# Patient Record
Sex: Male | Born: 1965 | Race: Black or African American | Hispanic: No | Marital: Single | Smoking: Current every day smoker
Health system: Southern US, Community
[De-identification: ages and names within clinical notes are randomized; demographics above are authoritative.]

## PROBLEM LIST (undated history)

## (undated) DIAGNOSIS — I10 Essential (primary) hypertension: Secondary | ICD-10-CM

## (undated) DIAGNOSIS — R7303 Prediabetes: Secondary | ICD-10-CM

## (undated) DIAGNOSIS — I639 Cerebral infarction, unspecified: Secondary | ICD-10-CM

## (undated) HISTORY — PX: TONSILLECTOMY: SUR1361

---

## 2009-01-28 ENCOUNTER — Emergency Department (HOSPITAL_BASED_OUTPATIENT_CLINIC_OR_DEPARTMENT_OTHER): Admission: EM | Admit: 2009-01-28 | Discharge: 2009-01-28 | Payer: Self-pay | Admitting: Emergency Medicine

## 2015-02-15 ENCOUNTER — Other Ambulatory Visit (INDEPENDENT_AMBULATORY_CARE_PROVIDER_SITE_OTHER): Payer: Self-pay | Admitting: Surgery

## 2015-03-08 ENCOUNTER — Encounter (HOSPITAL_COMMUNITY)
Admission: RE | Admit: 2015-03-08 | Discharge: 2015-03-08 | Disposition: A | Discharge status: Home / Self Care | Payer: Worker's Compensation | Source: Ambulatory Visit | Attending: Surgery | Admitting: Surgery

## 2015-03-08 ENCOUNTER — Encounter (HOSPITAL_COMMUNITY): Payer: Self-pay

## 2015-03-08 DIAGNOSIS — K429 Umbilical hernia without obstruction or gangrene: Secondary | ICD-10-CM | POA: Insufficient documentation

## 2015-03-08 DIAGNOSIS — Z01812 Encounter for preprocedural laboratory examination: Secondary | ICD-10-CM | POA: Diagnosis present

## 2015-03-08 LAB — CBC
HCT: 45.6 % (ref 39.0–52.0)
HEMOGLOBIN: 15.5 g/dL (ref 13.0–17.0)
MCH: 29.5 pg (ref 26.0–34.0)
MCHC: 34 g/dL (ref 30.0–36.0)
MCV: 86.9 fL (ref 78.0–100.0)
Platelets: 136 10*3/uL — ABNORMAL LOW (ref 150–400)
RBC: 5.25 MIL/uL (ref 4.22–5.81)
RDW: 13.2 % (ref 11.5–15.5)
WBC: 11 10*3/uL — ABNORMAL HIGH (ref 4.0–10.5)

## 2015-03-08 NOTE — Progress Notes (Signed)
Patient denies any cardiac issues.  DA

## 2015-03-08 NOTE — Pre-Procedure Instructions (Signed)
Aaron Gibson  03/08/2015   Your procedure is scheduled on:  Tuesday, March 22nd   Report [facility] Admitting at  7:00 AM.  Call this number if you have problems the morning of surgery: [phone]   Remember:   Do not eat food or drink liquids after midnight Monday.   Take these medicines the morning of surgery with A SIP OF WATER:    Do not wear jewelry - no rings or watches.  Do not wear lotions or colognes.  You may NOT wear deodorant the day of surgery.   Men may shave face and neck.   Do not bring valuables to the hospital.  [facility] is not responsible for any belongings or valuables.               Contacts, dentures or bridgework may not be worn into surgery.  Leave suitcase in the car. After surgery it may be brought to your room.  For patients admitted to the hospital, discharge time is determined by your treatment team.               Patients discharged the day of surgery will not be allowed to drive home, and will need someone to stay with you for the first 24 hrs after surgery.   Name and phone number of your driver:    Special Instructions: "Preparing for Surgery" instruction sheet.   Please read over the following fact sheets that you were given: Pain Booklet, Coughing and Deep Breathing and Surgical Site Infection Prevention

## 2015-03-14 MED ORDER — CEFAZOLIN SODIUM-DEXTROSE 2-3 GM-% IV SOLR
2.0000 g | INTRAVENOUS | Status: AC
  Administered 2015-03-15: 2 g via INTRAVENOUS
  Filled 2015-03-14: qty 50

## 2015-03-14 NOTE — H&P (Signed)
Aaron Gibson 02/15/2015 11:23 AM [facility] Patient #: [id number] DOB: 12-Feb-1966 Single / Language: [name] / Race: Black or African American Male  History of Present Illness Aaron Gibson A. Magnus Ivan MD; 02/15/2015 11:38 AM) Patient words: umb hernia.  The patient is a 49 year old male who presents with an umbilical hernia. This is a very pleasant gentleman referred by Dr. Ardith Dark for evaluation of an umbilical hernia. 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 Later that day he noticed a bulge was difficult to reduce. He started having some nausea and abdominal pain which worsened. It has since improved but he still has a bulge which worsens with any activity including eating. The pain is moderate in intensity. He also has some constipation. Again, it is a sharp pain   Other Problems Aaron Gibson, CMA; 02/15/2015 11:23 AM) No pertinent past medical history  Past Surgical History Aaron Gibson, CMA; 02/15/2015 11:23 AM) No pertinent past surgical history  Diagnostic Studies History Aaron Gibson, CMA; 02/15/2015 11:23 AM) Colonoscopy never  Allergies Aaron Gibson, CMA; 02/15/2015 11:25 AM) No Known Drug Allergies02/23/2016  Medication History Aaron Gibson, CMA; 02/15/2015 11:25 AM) No Current Medications Medications Reconciled  Social History Aaron Gibson, CMA; 02/15/2015 11:23 AM) Alcohol use Occasional alcohol use. Caffeine use Carbonated beverages, Coffee, Tea. No drug use Tobacco use Current every day smoker.  Family History Aaron Gibson, CMA; 02/15/2015 11:23 AM) First Degree Relatives No pertinent family history  Review of Systems Aaron Gibson CMA; 02/15/2015 11:23 AM) General Not Present- Appetite Loss, Chills, Fatigue, Fever, Night Sweats, Weight Gain and Weight Loss. Skin Not Present- Change in Wart/Mole, Dryness, Hives, Jaundice, New  Lesions, Non-Healing Wounds, Rash and Ulcer. HEENT Not Present- Earache, Hearing Loss, Hoarseness, Nose Bleed, Oral Ulcers, Ringing in the Ears, Seasonal Allergies, Sinus Pain, Sore Throat, Visual Disturbances, Wears glasses/contact lenses and Yellow Eyes. Respiratory Not Present- Bloody sputum, Chronic Cough, Difficulty Breathing, Snoring and Wheezing. Breast Not Present- Breast Mass, Breast Pain, Nipple Discharge and Skin Changes. Cardiovascular Not Present- Chest Pain, Difficulty Breathing Lying Down, Leg Cramps, Palpitations, Rapid Heart Rate, Shortness of Breath and Swelling of Extremities. Gastrointestinal Present- Abdominal Pain, Bloating, Change in Bowel Habits, Constipation and Excessive gas. Not Present- Bloody Stool, Chronic diarrhea, Difficulty Swallowing, Gets full quickly at meals, Hemorrhoids, Indigestion, Nausea, Rectal Pain and Vomiting. Male Genitourinary Not Present- Blood in Urine, Change in Urinary Stream, Frequency, Impotence, Nocturia, Painful Urination, Urgency and Urine Leakage. Musculoskeletal Not Present- Back Pain, Joint Pain, Joint Stiffness, Muscle Pain, Muscle Weakness and Swelling of Extremities. Neurological Not Present- Decreased Memory, Fainting, Headaches, Numbness, Seizures, Tingling, Tremor, Trouble walking and Weakness. Psychiatric Not Present- Anxiety, Bipolar, Change in Sleep Pattern, Depression, Fearful and Frequent crying. Endocrine Not Present- Cold Intolerance, Excessive Hunger, Hair Changes, Heat Intolerance, Hot flashes and New Diabetes. Hematology Not Present- Easy Bruising, Excessive bleeding, Gland problems, HIV and Persistent Infections.   Vitals (Sonya Gibson CMA; 02/15/2015 11:25 AM) 02/15/2015 11:24 AM Weight: 250 lb Height: 67in Body Surface Area: 2.32 m Body Mass Index: 39.16 kg/m Temp.: 53F(Temporal)  Pulse: 84 (Regular)  BP: 160/104 (Sitting, Left Arm, Standard)    Physical Exam (Kylon Philbrook A. Magnus Ivan MD; 02/15/2015 11:38  AM) General Mental Status-Alert. General Appearance-Consistent with stated age. Hydration-Well hydrated. Voice-Normal.  Head and Neck Head-normocephalic, atraumatic with no lesions or palpable masses. Trachea-midline.  Eye Eyeball - Bilateral-Extraocular movements intact. Sclera/Conjunctiva - Bilateral-No scleral  icterus.  Chest and Lung Exam Chest and lung exam reveals -quiet, even and easy respiratory effort with no use of accessory muscles and on auscultation, normal breath sounds, no adventitious sounds and normal vocal resonance. Inspection Chest Wall - Normal. Back - normal.  Cardiovascular Cardiovascular examination reveals -normal heart sounds, regular rate and rhythm with no murmurs and normal pedal pulses bilaterally.  Abdomen Inspection Skin - Scar - no surgical scars. Hernias - Umbilical hernia - Incarcerated. Note: There is a partially incarcerated umbilical hernia. It is mildly tender. I suspect it contains omentum although I cannot totally rule out bowel. Palpation/Percussion Palpation and Percussion of the abdomen reveal - Soft, Non Tender, No Rebound tenderness, No Rigidity (guarding) and No hepatosplenomegaly. Auscultation Auscultation of the abdomen reveals - Bowel sounds normal.  Neurologic Neurologic evaluation reveals -alert and oriented x 3 with no impairment of recent or remote memory. Mental Status-Normal.  Musculoskeletal Normal Exam - Left-Upper Extremity Strength Normal and Lower Extremity Strength Normal. Normal Exam - Right-Upper Extremity Strength Normal, Lower Extremity Weakness.    Assessment & Plan (Amarea Macdowell A. Magnus IvanBlackman MD; 02/15/2015 11:39 AM) Aaron ClanINCARCERATED UMBILICAL HERNIA (552.1  K42.0) Impression: I discussed the diagnosis with him in detail. I recommend urgent repair as he is symptomatic and there is something incarcerated in the hernia. I discussed hernia repair with him in detail. I discussed use of mesh. I  discussed the risks which includes but is not limited to bleeding, infection, injury to surrounding structures, recurrence, etc. I discussed postoperative recovery. He will have to refrain from any lifting postoperatively for 4-6 weeks. Surgery will be scheduled urgently Current Plans  Started Percocet 5-325MG , 1 (one) Tablet every hour, as needed, #40, 02/15/2015, No Refill.

## 2015-03-15 ENCOUNTER — Ambulatory Visit (HOSPITAL_COMMUNITY): Payer: Worker's Compensation | Admitting: Anesthesiology

## 2015-03-15 ENCOUNTER — Encounter (HOSPITAL_COMMUNITY)
Admission: RE | Disposition: A | Discharge status: Home / Self Care | Payer: Self-pay | Source: Ambulatory Visit | Attending: Surgery

## 2015-03-15 ENCOUNTER — Encounter (HOSPITAL_COMMUNITY): Payer: Self-pay | Admitting: *Deleted

## 2015-03-15 ENCOUNTER — Ambulatory Visit (HOSPITAL_COMMUNITY)
Admission: RE | Admit: 2015-03-15 | Discharge: 2015-03-15 | Disposition: A | Discharge status: Home / Self Care | Payer: Worker's Compensation | Source: Ambulatory Visit | Attending: Surgery | Admitting: Surgery

## 2015-03-15 DIAGNOSIS — K420 Umbilical hernia with obstruction, without gangrene: Secondary | ICD-10-CM | POA: Diagnosis not present

## 2015-03-15 DIAGNOSIS — Z87891 Personal history of nicotine dependence: Secondary | ICD-10-CM | POA: Insufficient documentation

## 2015-03-15 DIAGNOSIS — K429 Umbilical hernia without obstruction or gangrene: Secondary | ICD-10-CM | POA: Diagnosis present

## 2015-03-15 DIAGNOSIS — Z6839 Body mass index (BMI) 39.0-39.9, adult: Secondary | ICD-10-CM | POA: Insufficient documentation

## 2015-03-15 HISTORY — PX: UMBILICAL HERNIA REPAIR: SHX196

## 2015-03-15 LAB — GLUCOSE, CAPILLARY: Glucose-Capillary: 111 mg/dL — ABNORMAL HIGH (ref 70–99)

## 2015-03-15 SURGERY — REPAIR, HERNIA, UMBILICAL, ADULT
Anesthesia: General

## 2015-03-15 MED ORDER — BUPIVACAINE HCL (PF) 0.25 % IJ SOLN
INTRAMUSCULAR | Status: DC | PRN
Start: 2015-03-15 — End: 2015-03-15
  Administered 2015-03-15: 20 mL

## 2015-03-15 MED ORDER — HYDROMORPHONE HCL 1 MG/ML IJ SOLN
INTRAMUSCULAR | Status: AC
  Filled 2015-03-15: qty 1

## 2015-03-15 MED ORDER — BUPIVACAINE HCL (PF) 0.25 % IJ SOLN
INTRAMUSCULAR | Status: AC
  Filled 2015-03-15: qty 30

## 2015-03-15 MED ORDER — FENTANYL CITRATE 0.05 MG/ML IJ SOLN
INTRAMUSCULAR | Status: AC
  Filled 2015-03-15: qty 5

## 2015-03-15 MED ORDER — PROPOFOL 10 MG/ML IV BOLUS
INTRAVENOUS | Status: DC | PRN
  Administered 2015-03-15: 300 mg via INTRAVENOUS
  Administered 2015-03-15: 50 mg via INTRAVENOUS

## 2015-03-15 MED ORDER — PROPOFOL 10 MG/ML IV BOLUS
INTRAVENOUS | Status: AC
  Filled 2015-03-15: qty 20

## 2015-03-15 MED ORDER — ACETAMINOPHEN 325 MG PO TABS
650.0000 mg | ORAL_TABLET | ORAL | Status: DC | PRN
  Filled 2015-03-15: qty 2

## 2015-03-15 MED ORDER — 0.9 % SODIUM CHLORIDE (POUR BTL) OPTIME
TOPICAL | Status: DC | PRN
  Administered 2015-03-15: 1000 mL

## 2015-03-15 MED ORDER — FENTANYL CITRATE 0.05 MG/ML IJ SOLN
INTRAMUSCULAR | Status: DC | PRN
  Administered 2015-03-15 (×4): 50 ug via INTRAVENOUS

## 2015-03-15 MED ORDER — KETOROLAC TROMETHAMINE 30 MG/ML IJ SOLN
30.0000 mg | Freq: Once | INTRAMUSCULAR | Status: AC
  Administered 2015-03-15: 30 mg via INTRAVENOUS

## 2015-03-15 MED ORDER — BUPIVACAINE-EPINEPHRINE (PF) 0.25% -1:200000 IJ SOLN
INTRAMUSCULAR | Status: AC
  Filled 2015-03-15: qty 30

## 2015-03-15 MED ORDER — ACETAMINOPHEN 650 MG RE SUPP
650.0000 mg | RECTAL | Status: DC | PRN
  Filled 2015-03-15: qty 1

## 2015-03-15 MED ORDER — SODIUM CHLORIDE 0.9 % IJ SOLN: 3.0000 mL | INTRAMUSCULAR | Status: DC | PRN

## 2015-03-15 MED ORDER — PROMETHAZINE HCL 25 MG/ML IJ SOLN: 6.2500 mg | INTRAMUSCULAR | Status: DC | PRN

## 2015-03-15 MED ORDER — MIDAZOLAM HCL 2 MG/2ML IJ SOLN
INTRAMUSCULAR | Status: AC
  Filled 2015-03-15: qty 2

## 2015-03-15 MED ORDER — OXYCODONE HCL 5 MG PO TABS
ORAL_TABLET | ORAL | Status: AC
  Filled 2015-03-15: qty 2

## 2015-03-15 MED ORDER — FENTANYL CITRATE 0.05 MG/ML IJ SOLN
INTRAMUSCULAR | Status: AC
  Filled 2015-03-15: qty 2

## 2015-03-15 MED ORDER — BUPIVACAINE-EPINEPHRINE (PF) 0.5% -1:200000 IJ SOLN
INTRAMUSCULAR | Status: AC
  Filled 2015-03-15: qty 30

## 2015-03-15 MED ORDER — LIDOCAINE HCL (CARDIAC) 20 MG/ML IV SOLN
INTRAVENOUS | Status: AC
  Filled 2015-03-15: qty 5

## 2015-03-15 MED ORDER — OXYCODONE HCL 5 MG PO TABS
5.0000 mg | ORAL_TABLET | ORAL | Status: DC | PRN
  Administered 2015-03-15: 10 mg via ORAL

## 2015-03-15 MED ORDER — SODIUM CHLORIDE 0.9 % IJ SOLN: 3.0000 mL | Freq: Two times a day (BID) | INTRAMUSCULAR | Status: DC

## 2015-03-15 MED ORDER — ONDANSETRON HCL 4 MG/2ML IJ SOLN
INTRAMUSCULAR | Status: AC
  Filled 2015-03-15: qty 2

## 2015-03-15 MED ORDER — KETOROLAC TROMETHAMINE 30 MG/ML IJ SOLN
INTRAMUSCULAR | Status: AC
  Filled 2015-03-15: qty 1

## 2015-03-15 MED ORDER — SODIUM CHLORIDE 0.9 % IV SOLN: 250.0000 mL | INTRAVENOUS | Status: DC | PRN

## 2015-03-15 MED ORDER — LACTATED RINGERS IV SOLN
INTRAVENOUS | Status: DC
  Administered 2015-03-15: 08:00:00 via INTRAVENOUS

## 2015-03-15 MED ORDER — MORPHINE SULFATE 2 MG/ML IJ SOLN: 1.0000 mg | INTRAMUSCULAR | Status: DC | PRN

## 2015-03-15 MED ORDER — OXYCODONE-ACETAMINOPHEN 5-325 MG PO TABS: 1.0000 | ORAL_TABLET | ORAL | Status: DC | PRN

## 2015-03-15 MED ORDER — MIDAZOLAM HCL 5 MG/5ML IJ SOLN
INTRAMUSCULAR | Status: DC | PRN
  Administered 2015-03-15: 2 mg via INTRAVENOUS

## 2015-03-15 MED ORDER — LIDOCAINE HCL (CARDIAC) 20 MG/ML IV SOLN
INTRAVENOUS | Status: DC | PRN
  Administered 2015-03-15: 100 mg via INTRAVENOUS

## 2015-03-15 MED ORDER — FENTANYL CITRATE 0.05 MG/ML IJ SOLN
25.0000 ug | INTRAMUSCULAR | Status: DC | PRN
  Administered 2015-03-15 (×3): 50 ug via INTRAVENOUS

## 2015-03-15 MED ORDER — KETOROLAC TROMETHAMINE 30 MG/ML IJ SOLN
30.0000 mg | Freq: Once | INTRAMUSCULAR | Status: AC | PRN
  Administered 2015-03-15: 30 mg via INTRAVENOUS

## 2015-03-15 MED ORDER — ONDANSETRON HCL 4 MG/2ML IJ SOLN
INTRAMUSCULAR | Status: DC | PRN
  Administered 2015-03-15: 4 mg via INTRAVENOUS

## 2015-03-15 MED ORDER — HYDROMORPHONE HCL 1 MG/ML IJ SOLN
0.5000 mg | INTRAMUSCULAR | Status: DC | PRN
  Administered 2015-03-15 (×2): 0.5 mg via INTRAVENOUS

## 2015-03-15 SURGICAL SUPPLY — 45 items
BLADE SURG 10 STRL SS (BLADE) ×2 IMPLANT
BLADE SURG 15 STRL LF DISP TIS (BLADE) ×1 IMPLANT
BLADE SURG 15 STRL SS (BLADE) ×1
BLADE SURG ROTATE 9660 (MISCELLANEOUS) IMPLANT
CANISTER SUCTION 2500CC (MISCELLANEOUS) IMPLANT
CHLORAPREP W/TINT 26ML (MISCELLANEOUS) ×2 IMPLANT
COVER SURGICAL LIGHT HANDLE (MISCELLANEOUS) ×2 IMPLANT
DECANTER SPIKE VIAL GLASS SM (MISCELLANEOUS) ×2 IMPLANT
DRAPE PED LAPAROTOMY (DRAPES) ×2 IMPLANT
DRAPE UTILITY XL STRL (DRAPES) ×4 IMPLANT
DRSG TEGADERM 4X4.75 (GAUZE/BANDAGES/DRESSINGS) IMPLANT
ELECT CAUTERY BLADE 6.4 (BLADE) ×2 IMPLANT
ELECT REM PT RETURN 9FT ADLT (ELECTROSURGICAL) ×2
ELECTRODE REM PT RTRN 9FT ADLT (ELECTROSURGICAL) ×1 IMPLANT
GAUZE SPONGE 2X2 8PLY STRL LF (GAUZE/BANDAGES/DRESSINGS) IMPLANT
GLOVE BIOGEL PI IND STRL 6 (GLOVE) ×1 IMPLANT
GLOVE BIOGEL PI IND STRL 7.0 (GLOVE) ×1 IMPLANT
GLOVE BIOGEL PI INDICATOR 6 (GLOVE) ×1
GLOVE BIOGEL PI INDICATOR 7.0 (GLOVE) ×1
GLOVE SURG SIGNA 7.5 PF LTX (GLOVE) ×2 IMPLANT
GLOVE SURG SS PI 7.0 STRL IVOR (GLOVE) ×2 IMPLANT
GOWN STRL REUS W/ TWL LRG LVL3 (GOWN DISPOSABLE) ×1 IMPLANT
GOWN STRL REUS W/ TWL XL LVL3 (GOWN DISPOSABLE) ×1 IMPLANT
GOWN STRL REUS W/TWL LRG LVL3 (GOWN DISPOSABLE) ×1
GOWN STRL REUS W/TWL XL LVL3 (GOWN DISPOSABLE) ×1
KIT BASIN OR (CUSTOM PROCEDURE TRAY) ×2 IMPLANT
KIT ROOM TURNOVER OR (KITS) ×2 IMPLANT
LIQUID BAND (GAUZE/BANDAGES/DRESSINGS) ×2 IMPLANT
MESH VENTRALEX ST 1-7/10 CRC S (Mesh General) ×2 IMPLANT
NEEDLE HYPO 25GX1X1/2 BEV (NEEDLE) ×2 IMPLANT
NS IRRIG 1000ML POUR BTL (IV SOLUTION) ×2 IMPLANT
PACK SURGICAL SETUP 50X90 (CUSTOM PROCEDURE TRAY) ×2 IMPLANT
PAD ARMBOARD 7.5X6 YLW CONV (MISCELLANEOUS) ×2 IMPLANT
PENCIL BUTTON HOLSTER BLD 10FT (ELECTRODE) ×2 IMPLANT
SPONGE GAUZE 2X2 STER 10/PKG (GAUZE/BANDAGES/DRESSINGS)
SPONGE LAP 18X18 X RAY DECT (DISPOSABLE) ×2 IMPLANT
SUT MNCRL AB 4-0 PS2 18 (SUTURE) ×2 IMPLANT
SUT NOVA NAB DX-16 0-1 5-0 T12 (SUTURE) ×4 IMPLANT
SUT VIC AB 3-0 SH 27 (SUTURE) ×1
SUT VIC AB 3-0 SH 27X BRD (SUTURE) ×1 IMPLANT
SYR CONTROL 10ML LL (SYRINGE) ×2 IMPLANT
TOWEL OR 17X24 6PK STRL BLUE (TOWEL DISPOSABLE) ×2 IMPLANT
TOWEL OR 17X26 10 PK STRL BLUE (TOWEL DISPOSABLE) ×2 IMPLANT
TUBE CONNECTING 12X1/4 (SUCTIONS) IMPLANT
YANKAUER SUCT BULB TIP NO VENT (SUCTIONS) IMPLANT

## 2015-03-15 NOTE — Interval H&P Note (Signed)
History and Physical Interval Note: no change in H and P  03/15/2015 8:10 AM  Aaron Gibson  has presented today for surgery, with the diagnosis of Umbilical Hernia  The various methods of treatment have been discussed with the patient and family. After consideration of risks, benefits and other options for treatment, the patient has consented to  Procedure(s): UMBILICAL HERNIA REPAIR WITH MESH (N/A) as a surgical intervention .  The patient's history has been reviewed, patient examined, no change in status, stable for surgery.  I have reviewed the patient's chart and labs.  Questions were answered to the patient's satisfaction.     Oliva Montecalvo A

## 2015-03-15 NOTE — Anesthesia Procedure Notes (Signed)
Procedure Name: LMA Insertion Date/Time: 03/15/2015 8:37 AM Performed by: Sharlene DoryWALKER, Beecher Furio E Pre-anesthesia Checklist: Patient identified, Emergency Drugs available, Suction available, Patient being monitored and Timeout performed Patient Re-evaluated:Patient Re-evaluated prior to inductionOxygen Delivery Method: Circle system utilized Preoxygenation: Pre-oxygenation with 100% oxygen Intubation Type: IV induction Ventilation: Mask ventilation without difficulty LMA: LMA inserted LMA Size: 4.0 Number of attempts: 1 Placement Confirmation: positive ETCO2 and breath sounds checked- equal and bilateral Tube secured with: Tape Dental Injury: Teeth and Oropharynx as per pre-operative assessment

## 2015-03-15 NOTE — Discharge Instructions (Signed)
[facility] _______[facility] ° °UMBILICAL OR INGUINAL HERNIA REPAIR: POST OP INSTRUCTIONS ° °Always review your discharge instruction sheet given to you by the facility where your surgery was performed. °IF YOU HAVE DISABILITY OR FAMILY LEAVE FORMS, YOU MUST BRING THEM TO THE OFFICE FOR PROCESSING.   °DO NOT GIVE THEM TO YOUR DOCTOR. ° °1. A  prescription for pain medication may be given to you upon discharge.  Take your pain medication as prescribed, if needed.  If narcotic pain medicine is not needed, then you may take acetaminophen (Tylenol) or ibuprofen (Advil) as needed. °2. Take your usually prescribed medications unless otherwise directed. °3. If you need a refill on your pain medication, please contact your pharmacy.  They will contact our office to request authorization. Prescriptions will not be filled after 5 pm or on week-ends. °4. You should follow a light diet the first 24 hours after arrival home, such as soup and crackers, etc.  Be sure to include lots of fluids daily.  Resume your normal diet the day after surgery. °5. Most patients will experience some swelling and bruising around the umbilicus or in the groin and scrotum.  Ice packs and reclining will help.  Swelling and bruising can take several days to resolve.  °6. It is common to experience some constipation if taking pain medication after surgery.  Increasing fluid intake and taking a stool softener (such as Colace) will usually help or prevent this problem from occurring.  A mild laxative (Milk of Magnesia or Miralax) should be taken according to package directions if there are no bowel movements after 48 hours. °7. Unless discharge instructions indicate otherwise, you may remove your bandages 24-48 hours after surgery, and you may shower at that time.  You may have steri-strips (small skin tapes) in place directly over the incision.  These strips should be left on the skin for 7-10 days.  If your surgeon used skin glue on the  incision, you may shower in 24 hours.  The glue will flake off over the next 2-3 weeks.  Any sutures or staples will be removed at the office during your follow-up visit. °8. ACTIVITIES:  You may resume regular (light) daily activities beginning the next day--such as daily self-care, walking, climbing stairs--gradually increasing activities as tolerated.  You may have sexual intercourse when it is comfortable.  Refrain from any heavy lifting or straining until approved by your doctor. °a. You may drive when you are no longer taking prescription pain medication, you can comfortably wear a seatbelt, and you can safely maneuver your car and apply brakes. °b. RETURN TO WORK:  __________________________________________________________ °9. You should see your doctor in the office for a follow-up appointment approximately 2-3 weeks after your surgery.  Make sure that you call for this appointment within a day or two after you arrive home to insure a convenient appointment time. °10. OTHER INSTRUCTIONS: NO LIFTING MORE THAN 15 POUNDS FOR 4 WEEKS °11. ICE PACK AND IBUPROFEN ALSO FOR PAIN __________________________________________________________________________________________________________________________________________________________________________________________  °WHEN TO CALL YOUR DOCTOR: °1. Fever over 101.0 °2. Inability to urinate °3. Nausea and/or vomiting °4. Extreme swelling or bruising °5. Continued bleeding from incision. °6. Increased pain, redness, or drainage from the incision ° °The clinic staff is available to answer your questions during regular business hours.  Please don’t hesitate to call and ask to speak to one of the nurses for clinical concerns.  If you have a medical emergency, go to the nearest emergency room or call 911.    A surgeon from [facility] is always on call at the hospital ° ° °[address], [address], [place], [place]  [address] ? ° [address] [address], [place], [place]    [address] °[phone] ? [phone] ? FAX [phone] °Web site: [email or web address] °

## 2015-03-15 NOTE — Transfer of Care (Signed)
Immediate Anesthesia Transfer of Care Note  Patient: Aaron CivilRandy Curt  Procedure(s) Performed: Procedure(s): UMBILICAL HERNIA REPAIR WITH MESH (N/A)  Patient Location: PACU  Anesthesia Type:General  Level of Consciousness: awake, alert  and oriented  Airway & Oxygen Therapy: Patient Spontanous Breathing and Patient connected to nasal cannula oxygen  Post-op Assessment: Report given to RN, Post -op Vital signs reviewed and stable and Patient moving all extremities X 4  Post vital signs: Reviewed and stable  Last Vitals:  Filed Vitals:   03/15/15 0717  BP: 159/88  Pulse: 71  Temp: 37.1 C  Resp: 20    Complications: No apparent anesthesia complications

## 2015-03-15 NOTE — Anesthesia Preprocedure Evaluation (Addendum)
Anesthesia Evaluation  Patient identified by MRN, date of birth, ID band Patient awake    Reviewed: Allergy & Precautions, NPO status , Patient's Chart, lab work & pertinent test results  Airway Mallampati: II  TM Distance: <3 FB Neck ROM: Full    Dental no notable dental hx. (+) Teeth Intact   Pulmonary neg pulmonary ROS, former smoker,  breath sounds clear to auscultation  Pulmonary exam normal       Cardiovascular negative cardio ROS  Rhythm:Regular Rate:Normal     Neuro/Psych negative neurological ROS  negative psych ROS   GI/Hepatic negative GI ROS, Neg liver ROS,   Endo/Other  Morbid obesity  Renal/GU negative Renal ROS  negative genitourinary   Musculoskeletal negative musculoskeletal ROS (+)   Abdominal   Peds negative pediatric ROS (+)  Hematology negative hematology ROS (+)   Anesthesia Other Findings   Reproductive/Obstetrics negative OB ROS                            Anesthesia Physical Anesthesia Plan  ASA: II  Anesthesia Plan: General   Post-op Pain Management:    Induction: Intravenous  Airway Management Planned: Oral ETT  Additional Equipment:   Intra-op Plan:   Post-operative Plan: Extubation in OR  Informed Consent: I have reviewed the patients History and Physical, chart, labs and discussed the procedure including the risks, benefits and alternatives for the proposed anesthesia with the patient or authorized representative who has indicated his/her understanding and acceptance.   Dental advisory given  Plan Discussed with: CRNA and Surgeon  Anesthesia Plan Comments:         Anesthesia Quick Evaluation

## 2015-03-15 NOTE — Anesthesia Postprocedure Evaluation (Signed)
  Anesthesia Post-op Note  Patient: Aaron [name]  Procedure(s) Performed: Procedure(s) (LRB): UMBILICAL HERNIA REPAIR WITH MESH (N/A)  Patient Location: PACU  Anesthesia Type: General  Level of Consciousness: awake and alert   Airway and Oxygen Therapy: Patient Spontanous Breathing  Post-op Pain: mild  Post-op Assessment: Post-op Vital signs reviewed, Patient's Cardiovascular Status Stable, Respiratory Function Stable, Patent Airway and No signs of Nausea or vomiting  Last Vitals:  Filed Vitals:   03/15/15 1100  BP:   Pulse: 67  Temp:   Resp: 19    Post-op Vital Signs: stable   Complications: No apparent anesthesia complications

## 2015-03-15 NOTE — Op Note (Signed)
UMBILICAL HERNIA REPAIR WITH MESH  Procedure Note  Aaron Gibson Aaron Gibson 03/15/2015   Pre-op Diagnosis: Umbilical Hernia     Post-op Diagnosis: same  Procedure(s): UMBILICAL HERNIA REPAIR WITH MESH  Surgeon(s): Abigail Miyamotoouglas Antionio Negron, MD  Anesthesia: General  Staff:  Circulator: Lina SayreEmily E Ellis, RN; Trish MageMary W Tice, RN Scrub Person: Llana AlimentAnn M Wilson, CST  Estimated Blood Loss: Minimal                         Aaron Gibson   Date: 03/15/2015  Time: 9:18 AM

## 2015-03-16 ENCOUNTER — Encounter (HOSPITAL_COMMUNITY): Payer: Self-pay | Admitting: Surgery

## 2015-03-16 NOTE — Op Note (Signed)
[name], [name]               ACCOUNT NO.:  [id number]  MEDICAL RECORD NO.:  [id number]  LOCATION:  [facility]                         FACILITY:  [facility]  PHYSICIAN:  Abigail Miyamotoouglas Cameshia Cressman, M.D. DATE OF BIRTH:  May 02, 1966  DATE OF PROCEDURE:  03/15/2015 DATE OF DISCHARGE:  03/15/2015                              OPERATIVE REPORT   POSTOPERATIVE DIAGNOSIS:  Umbilical hernia.  POSTOPERATIVE DIAGNOSIS:  Umbilical hernia.  PROCEDURE:  Umbilical hernia repair with mesh.  SURGEON:  Abigail Miyamotoouglas Cleave Ternes, M.D.  ANESTHESIA:  General and 0.5% Marcaine.  ESTIMATED BLOOD LOSS:  Minimal.  FINDINGS:  The patient was found to have a small fascial defect just above the umbilicus containing incarcerated omentum.  It was repaired with a 4.3-cm round Bard ventral patch.  PROCEDURE IN DETAIL:  The patient was brought to the operating room and identified as Aaron Gibson.  He was placed supine on the operating room table and general anesthesia was induced.  His abdomen was then prepped and draped in usual sterile fashion.  I made a small vertical incision above the umbilicus.  I took this down to the hernia sac, which I then dissected out in its entirety.  I then excised the sac and reduced the omental and preperitoneal fat back into the abdominal cavity.  I then freed up the peritoneum circumferentially with the cautery.  No bowel was involved in the hernia.  Next, a 4.3-cm round ventral patch was brought onto the field.  I placed it through the fascial opening and pulled it up against the peritoneum with stay ties.  I then sewed the mesh in circumferentially with interrupted #1 Novafil sutures.  I then cut the stay ties and then closed the fascia over top of the mesh with several interrupted #1 Novafil sutures as well.  I then anesthetized the fascia circumferentially with Marcaine.  Wide coverage of the defect appeared to have been achieved with the patch.  I then closed the subcutaneous tissue with  interrupted 3-0 Vicryl sutures and closed the skin with running 4-0 Monocryl.  Skin glue was then applied.  The patient tolerated the procedure well.  All the counts were correct at the end of the procedure.  The patient was then extubated in the operating room and taken in a stable condition to the recovery room.     Abigail Miyamotoouglas Nica Friske, M.D.    DB/MEDQ  D:  03/15/2015  T:  03/16/2015  Job:  [id number]

## 2016-09-09 ENCOUNTER — Emergency Department (HOSPITAL_COMMUNITY)
Admission: EM | Admit: 2016-09-09 | Discharge: 2016-09-09 | Disposition: A | Discharge status: Home / Self Care | Payer: Self-pay | Attending: Emergency Medicine | Admitting: Emergency Medicine

## 2016-09-09 ENCOUNTER — Encounter (HOSPITAL_COMMUNITY): Payer: Self-pay

## 2016-09-09 DIAGNOSIS — Z87891 Personal history of nicotine dependence: Secondary | ICD-10-CM | POA: Insufficient documentation

## 2016-09-09 DIAGNOSIS — R112 Nausea with vomiting, unspecified: Secondary | ICD-10-CM | POA: Insufficient documentation

## 2016-09-09 DIAGNOSIS — R1084 Generalized abdominal pain: Secondary | ICD-10-CM | POA: Insufficient documentation

## 2016-09-09 LAB — URINALYSIS, ROUTINE W REFLEX MICROSCOPIC
Glucose, UA: NEGATIVE mg/dL
Hgb urine dipstick: NEGATIVE
KETONES UR: NEGATIVE mg/dL
LEUKOCYTES UA: NEGATIVE
Nitrite: NEGATIVE
PROTEIN: NEGATIVE mg/dL
Specific Gravity, Urine: 1.026 (ref 1.005–1.030)
pH: 5.5 (ref 5.0–8.0)

## 2016-09-09 LAB — COMPREHENSIVE METABOLIC PANEL
ALK PHOS: 77 U/L (ref 38–126)
ALT: 35 U/L (ref 17–63)
AST: 24 U/L (ref 15–41)
Albumin: 3.8 g/dL (ref 3.5–5.0)
Anion gap: 6 (ref 5–15)
BILIRUBIN TOTAL: 0.8 mg/dL (ref 0.3–1.2)
BUN: 12 mg/dL (ref 6–20)
CALCIUM: 8.9 mg/dL (ref 8.9–10.3)
CO2: 28 mmol/L (ref 22–32)
CREATININE: 1.07 mg/dL (ref 0.61–1.24)
Chloride: 105 mmol/L (ref 101–111)
Glucose, Bld: 130 mg/dL — ABNORMAL HIGH (ref 65–99)
Potassium: 3.7 mmol/L (ref 3.5–5.1)
Sodium: 139 mmol/L (ref 135–145)
TOTAL PROTEIN: 7.2 g/dL (ref 6.5–8.1)

## 2016-09-09 LAB — CBC
HCT: 48 % (ref 39.0–52.0)
Hemoglobin: 16.2 g/dL (ref 13.0–17.0)
MCH: 29.6 pg (ref 26.0–34.0)
MCHC: 33.8 g/dL (ref 30.0–36.0)
MCV: 87.8 fL (ref 78.0–100.0)
PLATELETS: 149 10*3/uL — AB (ref 150–400)
RBC: 5.47 MIL/uL (ref 4.22–5.81)
RDW: 13.1 % (ref 11.5–15.5)
WBC: 13.2 10*3/uL — AB (ref 4.0–10.5)

## 2016-09-09 LAB — LIPASE, BLOOD: LIPASE: 23 U/L (ref 11–51)

## 2016-09-09 MED ORDER — DICYCLOMINE HCL 20 MG PO TABS: 20.0000 mg | ORAL_TABLET | Freq: Two times a day (BID) | ORAL | 0 refills | Status: DC

## 2016-09-09 MED ORDER — HYDROMORPHONE HCL 1 MG/ML IJ SOLN
1.0000 mg | Freq: Once | INTRAMUSCULAR | Status: AC
  Administered 2016-09-09: 1 mg via INTRAVENOUS
  Filled 2016-09-09: qty 1

## 2016-09-09 MED ORDER — ONDANSETRON HCL 4 MG PO TABS: 4.0000 mg | ORAL_TABLET | Freq: Four times a day (QID) | ORAL | 0 refills | Status: DC

## 2016-09-09 MED ORDER — HYDROCODONE-ACETAMINOPHEN 5-325 MG PO TABS: 2.0000 | ORAL_TABLET | ORAL | 0 refills | Status: DC | PRN

## 2016-09-09 MED ORDER — ONDANSETRON HCL 4 MG/2ML IJ SOLN
4.0000 mg | Freq: Once | INTRAMUSCULAR | Status: AC
  Administered 2016-09-09: 4 mg via INTRAVENOUS
  Filled 2016-09-09: qty 2

## 2016-09-09 NOTE — ED Provider Notes (Signed)
[facility]-EMERGENCY DEPT Provider Note   CSN: [id number] Arrival date & time: 09/09/16  1111     History   Chief Complaint Chief Complaint  Patient presents with  . Abdominal Pain    HPI Atthew Coutant is a 50 y.o. male who presents with generalized abdominal pain, nausea, vomiting. No significant past medical history. Surgical history significant for umbilical hernia repair. He states that his pain has gradually gotten worse over the past 2 days. It is generalized, intermittent, feels like cramping and "knots". Movement and talking make it worse. He feels like he was constipated. He took a laxative which temporarily made pain better however cramping returned. Reports several episodes of nausea and non-bloody vomiting. Denies fever, chills, chest pain, shortness of breath, renal, irritative voiding symptoms. Last bowel movement was yesterday.  HPI  History reviewed. No pertinent past medical history.  There are no active problems to display for this patient.   Past Surgical History:  Procedure Laterality Date  . UMBILICAL HERNIA REPAIR N/A 03/15/2015   Procedure: UMBILICAL HERNIA REPAIR WITH MESH;  Surgeon: Abigail Miyamoto, MD;  Location: [facility] OR;  Service: General;  Laterality: N/A;       Home Medications    Prior to Admission medications   Not on File    Family History History reviewed. No pertinent family history.  Social History Social History  Substance Use Topics  . Smoking status: Former Smoker    Packs/day: 0.50    Years: 12.00    Types: Cigarettes  . Smokeless tobacco: Never Used  . Alcohol use Yes     Comment: occasionally holidays     Allergies   Review of patient's allergies indicates no known allergies.   Review of Systems Review of Systems  Constitutional: Negative for chills and fever.  Respiratory: Negative for shortness of breath.   Cardiovascular: Negative for chest pain.  Gastrointestinal: Positive for abdominal pain, constipation, nausea  and vomiting. Negative for diarrhea.  Genitourinary: Negative for dysuria.  All other systems reviewed and are negative.    Physical Exam Updated Vital Signs BP 124/73 (BP Location: Right Arm)   Pulse 66   Temp 97.9 F (36.6 C) (Oral)   Resp 16   SpO2 98%   Physical Exam  Constitutional: He is oriented to person, place, and time. He appears well-developed and well-nourished. No distress.  HENT:  Head: Normocephalic and atraumatic.  Eyes: Conjunctivae are normal. Pupils are equal, round, and reactive to light. Right eye exhibits no discharge. Left eye exhibits no discharge. No scleral icterus.  Neck: Normal range of motion. Neck supple.  Cardiovascular: Normal rate and regular rhythm.  Exam reveals no gallop and no friction rub.   No murmur heard. Pulmonary/Chest: Effort normal and breath sounds normal. No respiratory distress. He has no wheezes. He has no rales. He exhibits no tenderness.  Abdominal: Soft. Bowel sounds are normal. He exhibits no distension and no mass. There is tenderness. There is no rebound and no guarding. No hernia.  Generalized tenderness with maximal point of tenderness in the epigastrium  Musculoskeletal: He exhibits no edema.  Neurological: He is alert and oriented to person, place, and time.  Skin: Skin is warm and dry.  Psychiatric: He has a normal mood and affect.  Nursing note and vitals reviewed.    ED Treatments / Results  Labs (all labs ordered are listed, but only abnormal results are displayed) Labs Reviewed  COMPREHENSIVE METABOLIC PANEL - Abnormal; Notable for the following:  Result Value   Glucose, Bld 130 (*)    All other components within normal limits  CBC - Abnormal; Notable for the following:    WBC 13.2 (*)    Platelets 149 (*)    All other components within normal limits  URINALYSIS, ROUTINE W REFLEX MICROSCOPIC (NOT AT [facility]) - Abnormal; Notable for the following:    Color, Urine AMBER (*)    Bilirubin Urine SMALL (*)      All other components within normal limits  LIPASE, BLOOD    EKG  EKG Interpretation None       Radiology No results found.  Procedures Procedures (including critical care time)  Medications Ordered in ED Medications  ondansetron (ZOFRAN) injection 4 mg (4 mg Intravenous Given 09/09/16 1246)  HYDROmorphone (DILAUDID) injection 1 mg (1 mg Intravenous Given 09/09/16 1246)     Initial Impression / Assessment and Plan / ED Course  I have reviewed the triage vital signs and the nursing notes.  Pertinent labs & imaging results that were available during my care of the patient were reviewed by me and considered in my medical decision making (see chart for details).  Clinical Course   50 year old male presents with generalized abdominal pain, nausea, vomiting likely consistent with a viral process. He is still having BM - doubt obstruction.Patient is afebrile, not tachycardic or tachypneic, normotensive, and not hypoxic. CBC remarkable for leukocytosis of 13.2, CMP remarkable for glucose of 130. UA is clean. Patient reports symptomatic relief with Dilaudid and Zofran. Will d/c with pain meds, bentyl, zofran. Patient is NAD, non-toxic, with stable VS. Patient is informed of clinical course, understands medical decision making process, and agrees with plan. Opportunity for questions provided and all questions answered. Return precautions given.  Final Clinical Impressions(s) / ED Diagnoses   Final diagnoses:  Generalized abdominal pain  Non-intractable vomiting with nausea, vomiting of unspecified type    New Prescriptions Current Discharge Medication List       Bethel BornKelly Marie Tan Clopper, PA-C 09/09/16 1523    Maia PlanJoshua G Long, MD 09/10/16 1000

## 2016-09-09 NOTE — ED Notes (Signed)
Patient was alert, oriented and stable upon discharge. RN went over AVS and patient had no further questions. Pt was unable to sign the ED discharge due to signature pad error but received paperwork and and acknowledged the discharge instructions.

## 2016-09-09 NOTE — ED Triage Notes (Addendum)
Pt here with abdominal pain, n/v for 2 days. Some constipation.  No fever.  No change in urination.  Pt also states he has some blood in his stool.

## 2018-09-05 ENCOUNTER — Encounter: Payer: Self-pay | Admitting: Medical

## 2018-09-05 ENCOUNTER — Ambulatory Visit (HOSPITAL_BASED_OUTPATIENT_CLINIC_OR_DEPARTMENT_OTHER)
Admission: RE | Admit: 2018-09-05 | Discharge: 2018-09-05 | Disposition: A | Discharge status: Home / Self Care | Payer: PRIVATE HEALTH INSURANCE | Source: Ambulatory Visit | Attending: Medical | Admitting: Medical

## 2018-09-05 ENCOUNTER — Ambulatory Visit (INDEPENDENT_AMBULATORY_CARE_PROVIDER_SITE_OTHER): Payer: PRIVATE HEALTH INSURANCE | Admitting: Medical

## 2018-09-05 VITALS — BP 139/81 | HR 85 | Ht 67.0 in | Wt 237.0 lb

## 2018-09-05 DIAGNOSIS — R059 Cough, unspecified: Secondary | ICD-10-CM

## 2018-09-05 DIAGNOSIS — F17200 Nicotine dependence, unspecified, uncomplicated: Secondary | ICD-10-CM | POA: Insufficient documentation

## 2018-09-05 DIAGNOSIS — R05 Cough: Secondary | ICD-10-CM | POA: Diagnosis not present

## 2018-09-05 IMAGING — DX DG CHEST 2V
2 series · 2 of 2 positions shown · non-contrast
Comparison: None.

CLINICAL DATA: Occasional cough. Smoker.

EXAM:
CHEST - 2 VIEW

[chest pa]
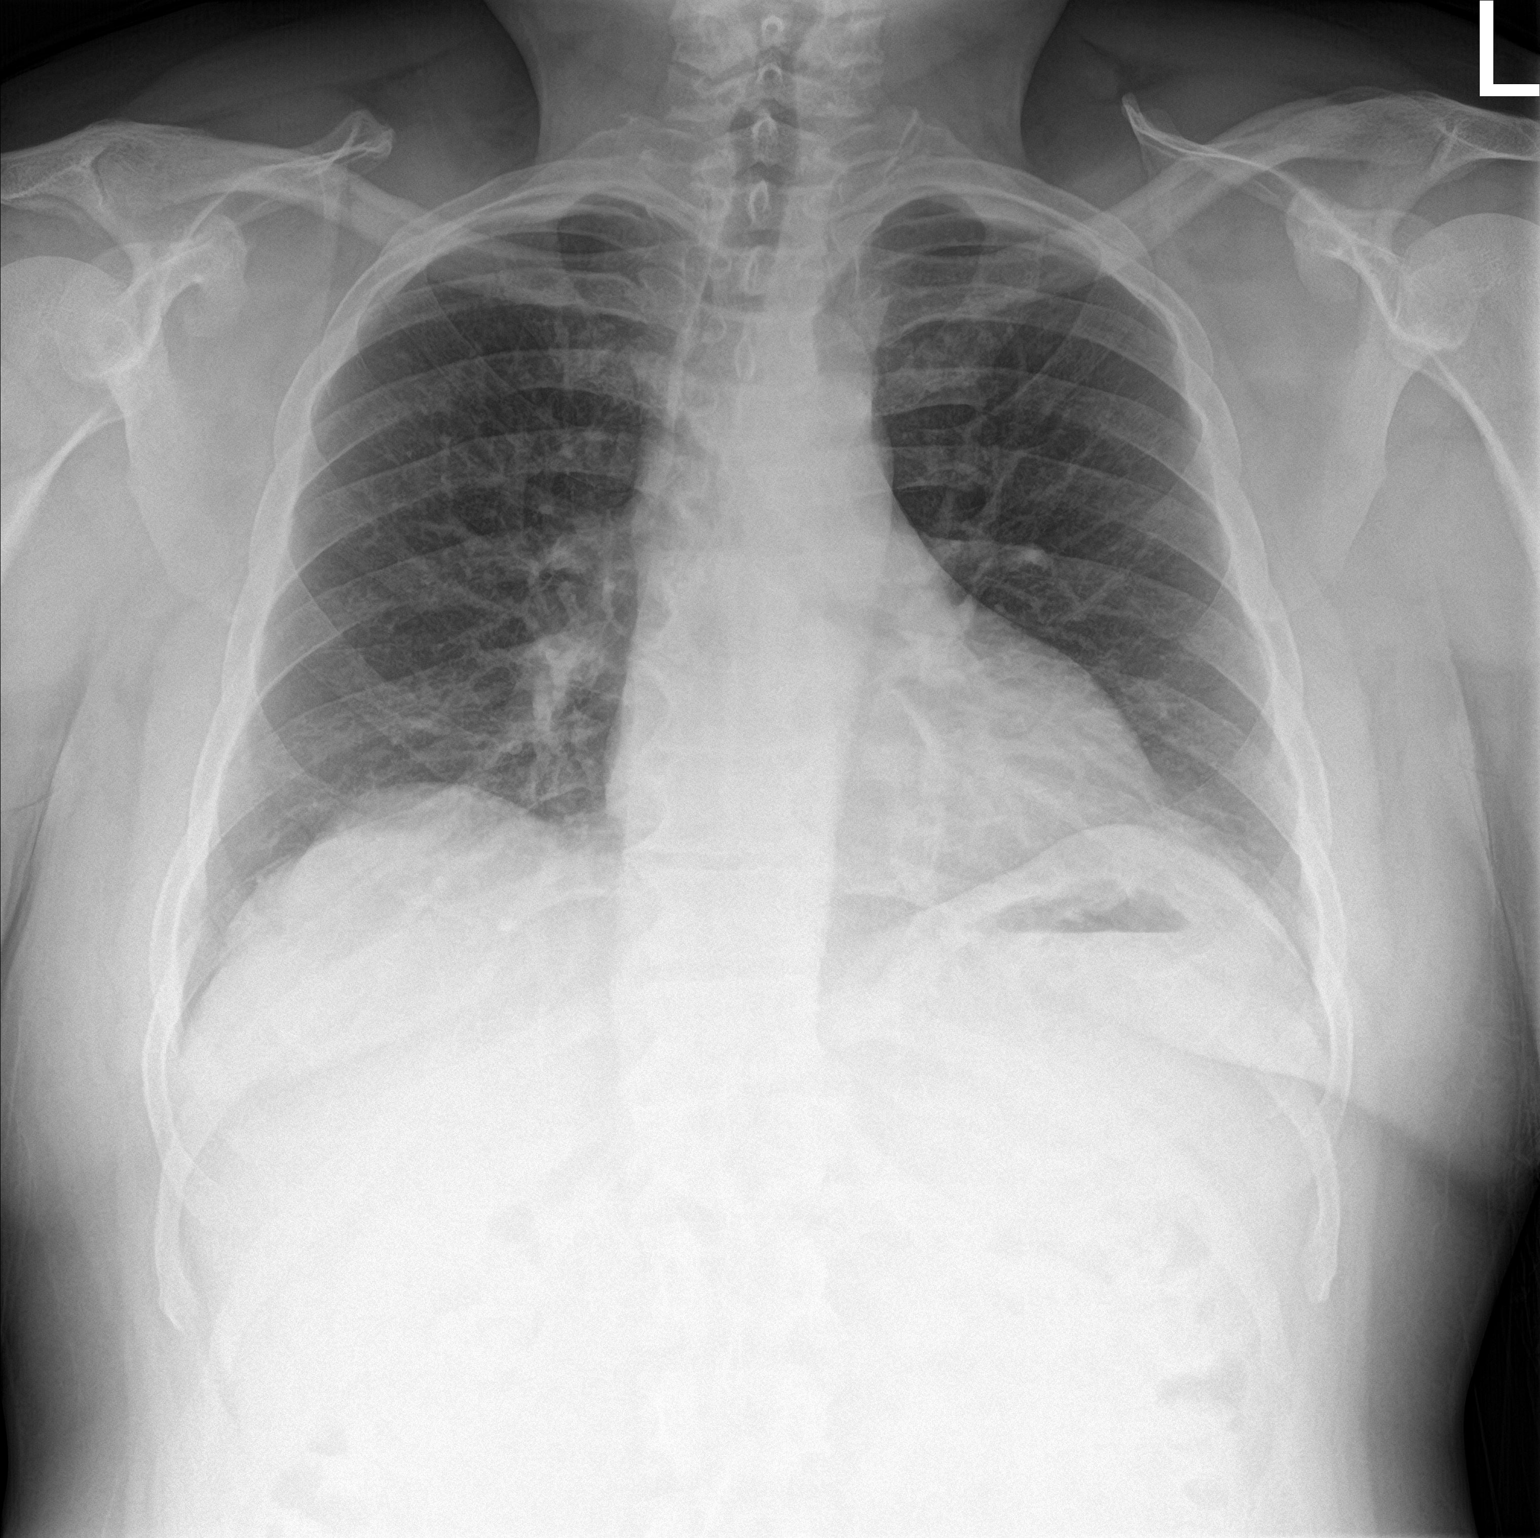

[chest lat]
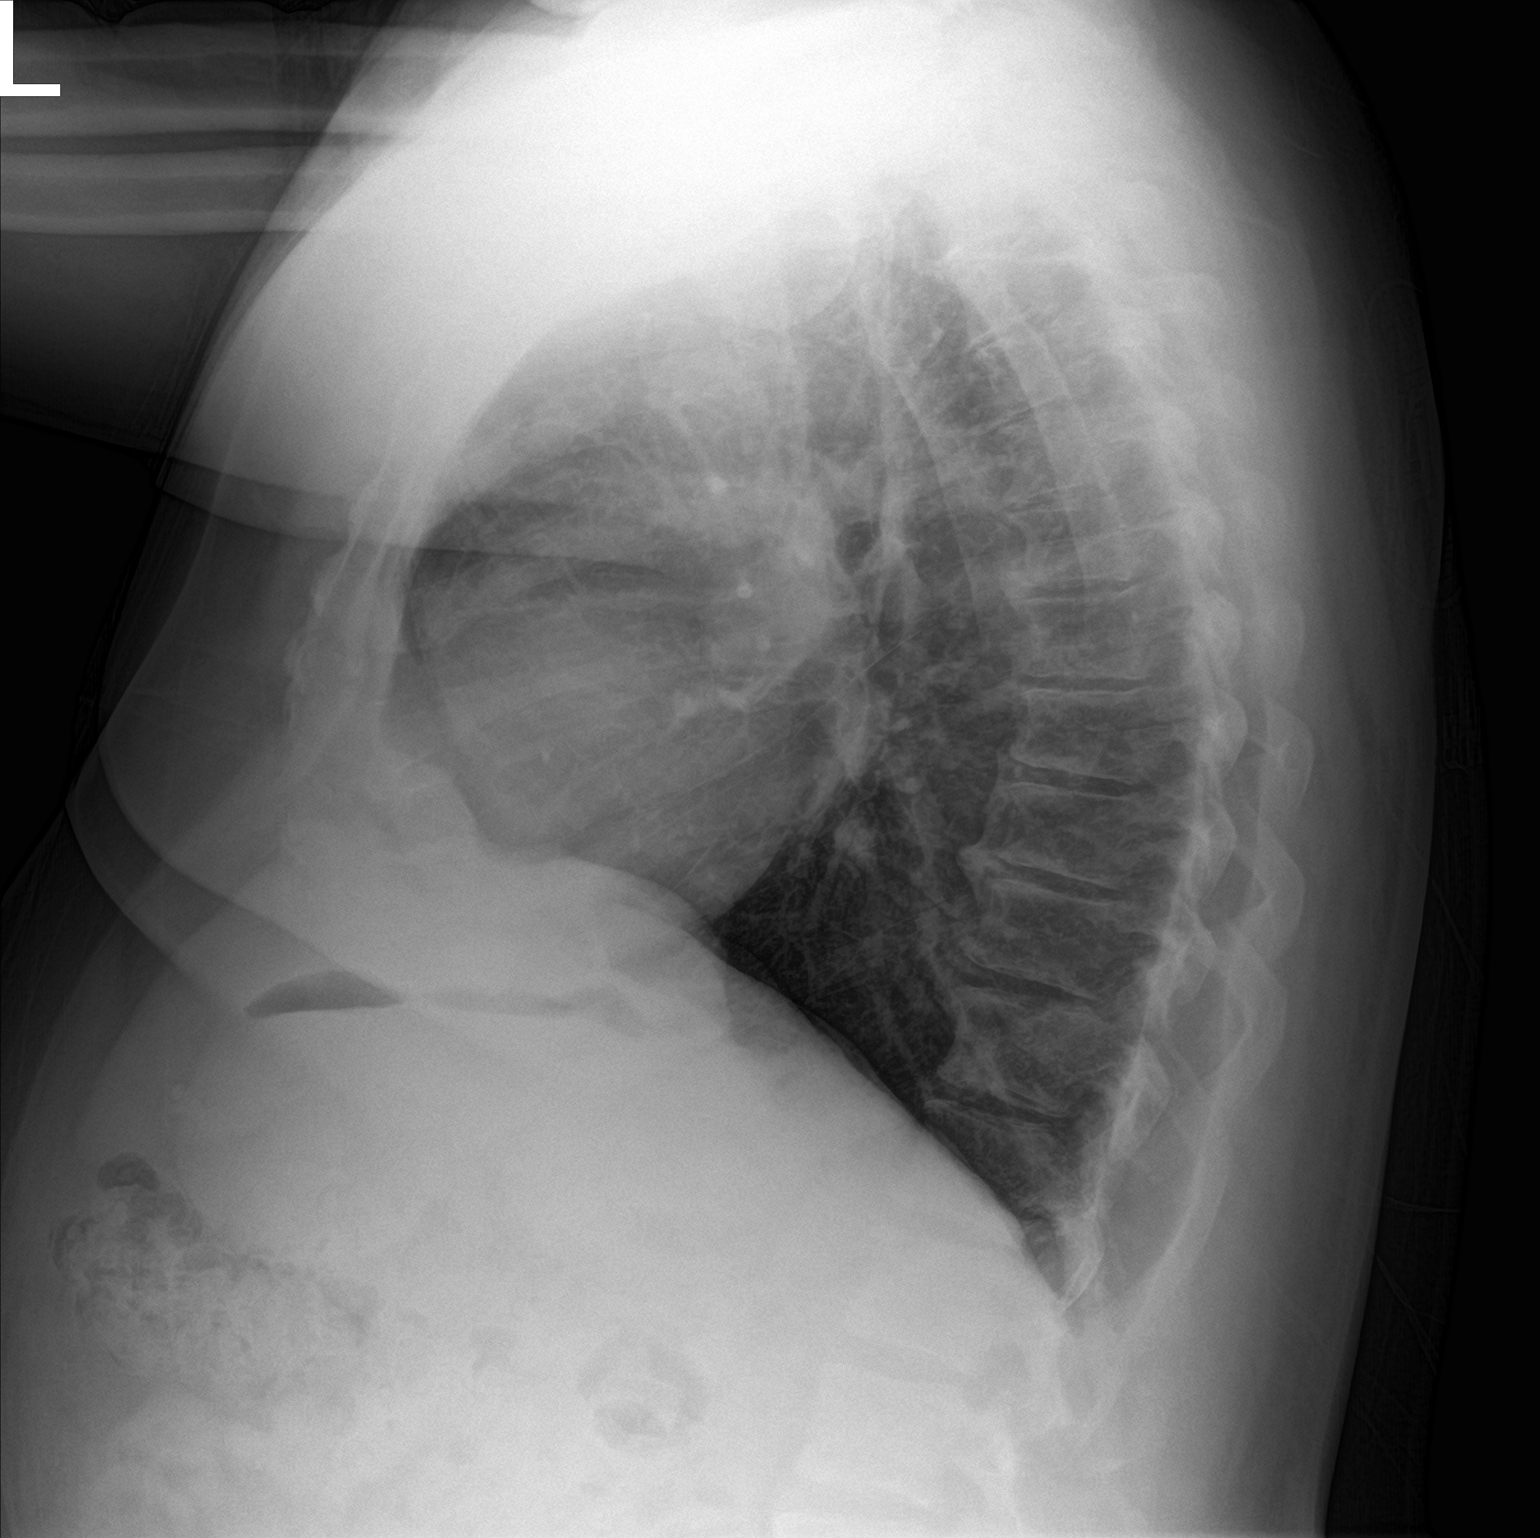

[2 of 2 positions shown; findings below may reference images not displayed]

FINDINGS: Normal sized heart. Clear lungs. Poor inspiration. Thoracic spine
degenerative changes.
IMPRESSION: No acute abnormality.

## 2018-09-05 MED ORDER — BUPROPION HCL ER (XL) 150 MG PO TB24: 150.0000 mg | ORAL_TABLET | Freq: Every day | ORAL | 0 refills | Status: DC

## 2018-09-05 NOTE — Patient Instructions (Addendum)
Nice to meet you today.    For your history of smoking and desire to quit, I prescribed you Wellbutrin.  You can start this medication today and then in 2 weeks start to taper down on the number of cigarettes she smokes daily.  You can get also screening a chest x-ray today or sometime next week.  No appointment needed for chest x-ray.   Your blood pressure is mild borderline elevated today.  I want you to check your blood pressure at home daily and write those readings down. Please bring readings  to next office visit.  Overall for your general health would recommend decreasing sugar intake particularly sodas.  On the way out today would asked to go ahead and schedule a complete physical exam for Wednesday next week.  It appears that the 2 PM slot is open.  Please fast for 8 hours prior to that appointment.  Follow-up as needed.

## 2018-09-05 NOTE — Progress Notes (Signed)
Subjective:    Patient ID: Aaron CivilRandy Natarajan, male    DOB: [birth year], 52 y.o.   MRN: [id number]  HPI  Pt in for first time.  Pt states no primary care provider in past other than urgent care of ED. No chronic meds.  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 No regular exercise but knows he walks about 11,000 steps a day. Pt states eats moderate healthy. He states cut back on red meat. Mostly chicken and fish. He drinks 2 16 oz soft drinks sprite. 1 cup coffee twice a week. Has girlfriend. Pt smokes a pack every 2 days.   Pt does want to quit smoking. He has tried to quit smoking in past but can't. In th past tried without meds. Longest he quite was 6 months.  Pt gets occasional cough but no fever, or chills. No wheezing.     Review of Systems  Constitutional: Negative for chills and fatigue.  Respiratory: Negative for cough, chest tightness, shortness of breath and wheezing.   Cardiovascular: Negative for chest pain and palpitations.  Gastrointestinal: Negative for abdominal pain.  Musculoskeletal: Negative for back pain and gait problem.  Skin: Negative for pallor and rash.  Neurological: Negative for dizziness, speech difficulty, weakness and light-headedness.  Hematological: Negative for adenopathy. Does not bruise/bleed easily.  Psychiatric/Behavioral: Negative for behavioral problems and confusion. The patient is not nervous/anxious.     No past medical history on file.   Social History   Socioeconomic History  . Marital status: Single    Spouse name: Not on file  . Number of children: Not on file  . Years of education: Not on file  . Highest education level: Not on file  Occupational History  . Not on file  Social Needs  . Financial resource strain: Not on file  . Food insecurity:    Worry: Not on file    Inability: Not on file  . Transportation needs:    Medical: Not on file    Non-medical: Not on file  Tobacco Use  . Smoking status: Current Every Day Smoker    Packs/day:  0.50    Years: 29.00    Pack years: 14.50    Types: Cigarettes  . Smokeless tobacco: Never Used  Substance and Sexual Activity  . Alcohol use: Yes    Comment: occasionally holidays  . Drug use: No  . Sexual activity: Yes  Lifestyle  . Physical activity:    Days per week: Not on file    Minutes per session: Not on file  . Stress: Not on file  Relationships  . Social connections:    Talks on phone: Not on file    Gets together: Not on file    Attends religious service: Not on file    Active member of club or organization: Not on file    Attends meetings of clubs or organizations: Not on file    Relationship status: Not on file  . Intimate partner violence:    Fear of current or ex partner: Not on file    Emotionally abused: Not on file    Physically abused: Not on file    Forced sexual activity: Not on file  Other Topics Concern  . Not on file  Social History Narrative  . Not on file    Past Surgical History:  Procedure Laterality Date  . UMBILICAL HERNIA REPAIR N/A 03/15/2015   Procedure: UMBILICAL HERNIA REPAIR WITH MESH;  Surgeon: Abigail Miyamotoouglas Blackman, MD;  [facility];  Service: General;  Laterality: N/A;    No family history on file.  No Known Allergies  Current Outpatient Medications on File Prior to Visit  Medication Sig Dispense Refill  . dicyclomine (BENTYL) 20 MG tablet Take 1 tablet (20 mg total) by mouth 2 (two) times daily. 10 tablet 0  . HYDROcodone-acetaminophen (NORCO/VICODIN) 5-325 MG tablet Take 2 tablets by mouth every 4 (four) hours as needed. 6 tablet 0  . ondansetron (ZOFRAN) 4 MG tablet Take 1 tablet (4 mg total) by mouth every 6 (six) hours. 8 tablet 0   No current facility-administered medications on file prior to visit.     BP 139/81   Pulse 85   Ht 5\' 7"  (1.702 m)   Wt 237 lb (107.5 kg)   SpO2 100%   BMI 37.12 kg/m      Objective:   Physical Exam  General Mental Status- Alert. General Appearance- Not in acute distress.    Skin General: Color- Normal Color. Moisture- Normal Moisture.  Neck Carotid Arteries- Normal color. Moisture- Normal Moisture. No carotid bruits. No JVD.  Chest and Lung Exam Auscultation: Breath Sounds:-Normal.  Cardiovascular Auscultation:Rythm- Regular. Murmurs & Other Heart Sounds:Auscultation of the heart reveals- No Murmurs.   Neurologic Cranial Nerve exam:- CN III-XII intact(No nystagmus), symmetric smile. Strength:- 5/5 equal and symmetric strength both upper and lower extremities.      Assessment & Plan:  Nice to meet you today.    For your history of smoking and desire to quit, I prescribed you Wellbutrin.  You can start this medication today and then in 2 weeks start to taper down on the number of cigarettes she smokes daily.  You can get also screening a chest x-ray today or sometime next week.  No appointment needed for chest x-ray.   Your blood pressure is mild borderline elevated today.  I want you to check your blood pressure at home daily and write those readings down.  Overall for your general health would recommend decreasing sugar intake particularly sodas.  On the way out today would asked to go ahead and schedule a complete physical exam for Wednesday next week.  It appears that the 2 PM slot is open.  Please fast for 8 hours prior to that appointment.  Follow-up as needed.   Esperanza Richters, PA-C

## 2018-09-10 ENCOUNTER — Telehealth: Payer: Self-pay | Admitting: Medical

## 2018-09-10 ENCOUNTER — Encounter: Payer: Self-pay | Admitting: Medical

## 2018-09-10 ENCOUNTER — Ambulatory Visit (INDEPENDENT_AMBULATORY_CARE_PROVIDER_SITE_OTHER): Payer: PRIVATE HEALTH INSURANCE | Admitting: Medical

## 2018-09-10 VITALS — BP 158/94 | HR 71 | Temp 98.2°F | Resp 16 | Ht 67.0 in | Wt 235.8 lb

## 2018-09-10 DIAGNOSIS — R351 Nocturia: Secondary | ICD-10-CM | POA: Diagnosis not present

## 2018-09-10 DIAGNOSIS — R739 Hyperglycemia, unspecified: Secondary | ICD-10-CM

## 2018-09-10 DIAGNOSIS — Z0000 Encounter for general adult medical examination without abnormal findings: Secondary | ICD-10-CM | POA: Diagnosis not present

## 2018-09-10 DIAGNOSIS — Z125 Encounter for screening for malignant neoplasm of prostate: Secondary | ICD-10-CM

## 2018-09-10 DIAGNOSIS — Z23 Encounter for immunization: Secondary | ICD-10-CM

## 2018-09-10 NOTE — Progress Notes (Signed)
Subjective:    Patient ID: Aaron Gibson, male    DOB: June 29, 1966, 52 y.o.   MRN: [id number]  HPI  Pt is fasting. Will do cpe today.  Tdap not up to date and he will get that. Pt ok with hiv screen.   Since last visit  he checked his bp and was 140/90 when girlfriend checked. Pt has not checked bp since. No cardiac or neurologic signs or symptoms. He wants to wait until starting meds. He thinks if decreases salt intake, and stops smoking bp will improve. Also he indicates drank a lot of coffee today since he was fasting.  Pt has been eating less salt and getting 11,000 steps a day.  Pt has not started wellbutrin. Written for smoking cessation. He has not started the medication yet. He was waiting until after his birthday.     Review of Systems  Constitutional: Negative for chills, fatigue and fever.  HENT: Negative for congestion, ear discharge, ear pain, facial swelling, mouth sores, nosebleeds, postnasal drip, sinus pressure, sinus pain and trouble swallowing.   Respiratory: Negative for cough, chest tightness, shortness of breath and wheezing.   Cardiovascular: Negative for chest pain and palpitations.  Gastrointestinal: Negative for abdominal pain.  Genitourinary: Positive for frequency. Negative for decreased urine volume, difficulty urinating, discharge, dysuria, penile swelling, scrotal swelling and urgency.       Some frequency at night.  No know FH prostate issues/cancer.  Musculoskeletal: Negative for arthralgias, back pain, gait problem, neck pain and neck stiffness.       Mild back pain Rt side. Paraspinal pain.Pain for 2-3 weeks.   Skin: Negative for rash.  Neurological: Negative for dizziness, speech difficulty, weakness, numbness and headaches.  Psychiatric/Behavioral: Positive for behavioral problems. Negative for confusion, dysphoric mood, self-injury and suicidal ideas. The patient is not nervous/anxious.     No past medical history on file.   Social  History   Socioeconomic History  . Marital status: Single    Spouse name: Not on file  . Number of children: Not on file  . Years of education: Not on file  . Highest education level: Not on file  Occupational History  . Not on file  Social Needs  . Financial resource strain: Not on file  . Food insecurity:    Worry: Not on file    Inability: Not on file  . Transportation needs:    Medical: Not on file    Non-medical: Not on file  Tobacco Use  . Smoking status: Current Every Day Smoker    Packs/day: 0.50    Years: 29.00    Pack years: 14.50    Types: Cigarettes  . Smokeless tobacco: Never Used  Substance and Sexual Activity  . Alcohol use: Yes    Comment: occasionally holidays  . Drug use: No  . Sexual activity: Yes  Lifestyle  . Physical activity:    Days per week: Not on file    Minutes per session: Not on file  . Stress: Not on file  Relationships  . Social connections:    Talks on phone: Not on file    Gets together: Not on file    Attends religious service: Not on file    Active member of club or organization: Not on file    Attends meetings of clubs or organizations: Not on file    Relationship status: Not on file  . Intimate partner violence:    Fear of current or ex partner: Not on file  Emotionally abused: Not on file    Physically abused: Not on file    Forced sexual activity: Not on file  Other Topics Concern  . Not on file  Social History Narrative  . Not on file    Past Surgical History:  Procedure Laterality Date  . UMBILICAL HERNIA REPAIR N/A 03/15/2015   Procedure: UMBILICAL HERNIA REPAIR WITH MESH;  Surgeon: Abigail Miyamoto, MD;  Location: [facility] OR;  Service: General;  Laterality: N/A;    No family history on file.  No Known Allergies  Current Outpatient Medications on File Prior to Visit  Medication Sig Dispense Refill  . buPROPion (WELLBUTRIN XL) 150 MG 24 hr tablet Take 1 tablet (150 mg total) by mouth daily. 30 tablet 0   No  current facility-administered medications on file prior to visit.     BP (!) 158/94   Pulse 71   Temp 98.2 F (36.8 C) (Oral)   Resp 16   Ht 5\' 7"  (1.702 m)   Wt 235 lb 12.8 oz (107 kg)   SpO2 99%   BMI 36.93 kg/m       Objective:   Physical Exam  General Mental Status- Alert. General Appearance- Not in acute distress.   Skin General: Color- Normal Color. Moisture- Normal Moisture.  Neck Carotid Arteries- Normal color. Moisture- Normal Moisture. No carotid bruits. No JVD.  Chest and Lung Exam Auscultation: Breath Sounds:-Normal.  Cardiovascular Auscultation:Rythm- Regular. Murmurs & Other Heart Sounds:Auscultation of the heart reveals- No Murmurs.  Abdomen Inspection:-Inspeection Normal. Palpation/Percussion:Note:No mass. Palpation and Percussion of the abdomen reveal- Non Tender, Non Distended + BS, no rebound or guarding.  Neurologic Cranial Nerve exam:- CN III-XII intact(No nystagmus), symmetric smile. Strength:- 5/5 equal and symmetric strength both upper and lower extremities.  Back- mid spinal pain. Faint rt side paralumbar tenderness to palpation.      Assessment & Plan:  For you wellness exam today I have ordered cbc, cmp, lipid panel, ua and hiv. Also ordered psa for prostate screening.  Vaccine given today tdap. Flu vaccine declined.  Recommend exercise and healthy diet. Low salt/low sugar diet. Also decrease caffeine intake and decrease smoking.  Avoid nsaids presently while checking bp daily   We will let you know lab results as they come in.  Follow up date appointment will be determined after lab review.   Pt will update me how his back is when we call on labs.  Esperanza Richters, PA-C

## 2018-09-10 NOTE — Patient Instructions (Addendum)
For you wellness exam today I have ordered cbc, cmp, lipid panel, ua and hiv. Also ordered psa for prostate screening.  Vaccine given today tdap. Flu vaccine declined.  Recommend exercise and healthy diet. Low salt/low sugar diet. Also decrease caffeine intake and decrease smoking.  Avoid nsaids presently while checking bp daily   We will let you know lab results as they come in.  Follow up date appointment will be determined after lab review.    Preventive Care 40-64 Years, Male Preventive care refers to lifestyle choices and visits with your health care provider that can promote health and wellness. What does preventive care include?  A yearly physical exam. This is also called an annual well check.  Dental exams once or twice a year.  Routine eye exams. Ask your health care provider how often you should have your eyes checked.  Personal lifestyle choices, including: ? Daily care of your teeth and gums. ? Regular physical activity. ? Eating a healthy diet. ? Avoiding tobacco and drug use. ? Limiting alcohol use. ? Practicing safe sex. ? Taking low-dose aspirin every day starting at age 23. What happens during an annual well check? The services and screenings done by your health care provider during your annual well check will depend on your age, overall health, lifestyle risk factors, and family history of disease. Counseling Your health care provider may ask you questions about your:  Alcohol use.  Tobacco use.  Drug use.  Emotional well-being.  Home and relationship well-being.  Sexual activity.  Eating habits.  Work and work Statistician.  Screening You may have the following tests or measurements:  Height, weight, and BMI.  Blood pressure.  Lipid and cholesterol levels. These may be checked every 5 years, or more frequently if you are over 15 years old.  Skin check.  Lung cancer screening. You may have this screening every year starting at age 74 if  you have a 30-pack-year history of smoking and currently smoke or have quit within the past 15 years.  Fecal occult blood test (FOBT) of the stool. You may have this test every year starting at age 17.  Flexible sigmoidoscopy or colonoscopy. You may have a sigmoidoscopy every 5 years or a colonoscopy every 10 years starting at age 8.  Prostate cancer screening. Recommendations will vary depending on your family history and other risks.  Hepatitis C blood test.  Hepatitis B blood test.  Sexually transmitted disease (STD) testing.  Diabetes screening. This is done by checking your blood sugar (glucose) after you have not eaten for a while (fasting). You may have this done every 1-3 years.  Discuss your test results, treatment options, and if necessary, the need for more tests with your health care provider. Vaccines Your health care provider may recommend certain vaccines, such as:  Influenza vaccine. This is recommended every year.  Tetanus, diphtheria, and acellular pertussis (Tdap, Td) vaccine. You may need a Td booster every 10 years.  Varicella vaccine. You may need this if you have not been vaccinated.  Zoster vaccine. You may need this after age 25.  Measles, mumps, and rubella (MMR) vaccine. You may need at least one dose of MMR if you were born in 1957 or later. You may also need a second dose.  Pneumococcal 13-valent conjugate (PCV13) vaccine. You may need this if you have certain conditions and have not been vaccinated.  Pneumococcal polysaccharide (PPSV23) vaccine. You may need one or two doses if you smoke cigarettes or if  you have certain conditions.  Meningococcal vaccine. You may need this if you have certain conditions.  Hepatitis A vaccine. You may need this if you have certain conditions or if you travel or work in places where you may be exposed to hepatitis A.  Hepatitis B vaccine. You may need this if you have certain conditions or if you travel or work in  places where you may be exposed to hepatitis B.  Haemophilus influenzae type b (Hib) vaccine. You may need this if you have certain risk factors.  Talk to your health care provider about which screenings and vaccines you need and how often you need them. This information is not intended to replace advice given to you by your health care provider. Make sure you discuss any questions you have with your health care provider. Document Released: 01/06/2016 Document Revised: 08/29/2016 Document Reviewed: 10/11/2015 Elsevier Interactive Patient Education  [employer].

## 2018-09-10 NOTE — Telephone Encounter (Signed)
Would you result pt urine. Took out order since not resulted and could not close chart.

## 2018-09-11 LAB — CBC WITH DIFFERENTIAL/PLATELET
Basophils Absolute: 0.1 10*3/uL (ref 0.0–0.1)
Basophils Relative: 0.9 % (ref 0.0–3.0)
EOS PCT: 1 % (ref 0.0–5.0)
Eosinophils Absolute: 0.1 10*3/uL (ref 0.0–0.7)
HCT: 48 % (ref 39.0–52.0)
HEMOGLOBIN: 15.7 g/dL (ref 13.0–17.0)
Lymphocytes Relative: 31 % (ref 12.0–46.0)
Lymphs Abs: 3.2 10*3/uL (ref 0.7–4.0)
MCHC: 32.8 g/dL (ref 30.0–36.0)
MCV: 89 fl (ref 78.0–100.0)
MONO ABS: 0.7 10*3/uL (ref 0.1–1.0)
Monocytes Relative: 7.3 % (ref 3.0–12.0)
Neutro Abs: 6.1 10*3/uL (ref 1.4–7.7)
Neutrophils Relative %: 59.8 % (ref 43.0–77.0)
Platelets: 157 10*3/uL (ref 150.0–400.0)
RBC: 5.39 Mil/uL (ref 4.22–5.81)
RDW: 14 % (ref 11.5–15.5)
WBC: 10.2 10*3/uL (ref 4.0–10.5)

## 2018-09-11 LAB — LIPID PANEL
CHOLESTEROL: 162 mg/dL (ref 0–200)
HDL: 46.3 mg/dL (ref >39.00)
LDL Cholesterol: 102 mg/dL — ABNORMAL HIGH (ref 0–99)
NonHDL: 115.66
Total CHOL/HDL Ratio: 3
Triglycerides: 68 mg/dL (ref 0.0–149.0)
VLDL: 13.6 mg/dL (ref 0.0–40.0)

## 2018-09-11 LAB — COMPREHENSIVE METABOLIC PANEL
ALBUMIN: 4 g/dL (ref 3.5–5.2)
ALK PHOS: 87 U/L (ref 39–117)
ALT: 22 U/L (ref 0–53)
AST: 17 U/L (ref 0–37)
BILIRUBIN TOTAL: 0.4 mg/dL (ref 0.2–1.2)
BUN: 12 mg/dL (ref 6–23)
CO2: 30 mEq/L (ref 19–32)
Calcium: 9.3 mg/dL (ref 8.4–10.5)
Chloride: 105 mEq/L (ref 96–112)
Creatinine, Ser: 1.08 mg/dL (ref 0.40–1.50)
GFR: 93.08 mL/min (ref >60.00)
Glucose, Bld: 90 mg/dL (ref 70–99)
Potassium: 4.7 mEq/L (ref 3.5–5.1)
SODIUM: 142 meq/L (ref 135–145)
TOTAL PROTEIN: 6.8 g/dL (ref 6.0–8.3)

## 2018-09-11 LAB — PSA: PSA: 0.51 ng/mL (ref 0.10–4.00)

## 2018-09-11 LAB — HEMOGLOBIN A1C: HEMOGLOBIN A1C: 6.3 % (ref 4.6–6.5)

## 2019-01-14 ENCOUNTER — Ambulatory Visit: Payer: Self-pay | Admitting: Medical

## 2019-01-14 ENCOUNTER — Encounter: Payer: Self-pay | Admitting: Medical

## 2019-01-14 VITALS — BP 150/90 | HR 77 | Temp 97.7°F | Resp 16 | Ht 67.0 in | Wt 246.8 lb

## 2019-01-14 DIAGNOSIS — R41840 Attention and concentration deficit: Secondary | ICD-10-CM

## 2019-01-14 DIAGNOSIS — F17200 Nicotine dependence, unspecified, uncomplicated: Secondary | ICD-10-CM

## 2019-01-14 DIAGNOSIS — I10 Essential (primary) hypertension: Secondary | ICD-10-CM

## 2019-01-14 MED ORDER — LOSARTAN POTASSIUM 50 MG PO TABS: 50.0000 mg | ORAL_TABLET | Freq: Every day | ORAL | 0 refills | Status: DC

## 2019-01-14 MED ORDER — BUPROPION HCL ER (XL) 300 MG PO TB24: 300.0000 mg | ORAL_TABLET | Freq: Every day | ORAL | 0 refills | Status: DC

## 2019-01-14 NOTE — Patient Instructions (Addendum)
Your blood pressure is elevated today.  In the past it has been borderline elevated as well.  I do think it is best to go ahead and give you losartan 50 mg tablets.  Want you to start those and then follow-up for recheck in 2 weeks to see if we might need to increase the dose.  You do describe poor concentration and you filled out screening questionnaire for concentration today.  I also want you to see a psychologist for formal ADD evaluation/testing. Referral placed.  During the interim I am going to prescribe you Wellbutrin.  Hopefully at the higher dose you might get some improved concentration benefits and it will help you decrease or stop your smoking again.  Follow-up in 2 weeks for blood pressure check or as needed.

## 2019-01-14 NOTE — Progress Notes (Signed)
Subjective:    Patient ID: Aaron Gibson, male    DOB: 07-22-1966, 53 y.o.   MRN: [id number]  HPI  Pt in for some report. 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 He had some concentration issues/decreased when younger. He was in school recently and had more hands on courses but no concentration issues with these type studies. Now mostly readings with various courses. Looses focus easily. Pt just started  New  semester first week of January. Describes having to read a lot of material and not absorbing the material due to poor concentration.  Pt is in school  8 am- 2 pm daily.  Pt states he is a smoker. I had him on wellbutrin in past. He used it only one month. It did help him decrease smoking but he ran out. Maybe  A little more alert while taking.   Pt bp was on borderline level in past. Today also on borderline level.    Review of Systems  Constitutional: Negative for chills, fatigue and fever.  HENT: Negative for congestion and drooling.   Respiratory: Negative for cough, chest tightness, shortness of breath and wheezing.   Cardiovascular: Negative for chest pain and palpitations.  Gastrointestinal: Negative for abdominal pain.  Genitourinary: Negative for dysuria.  Musculoskeletal: Negative for back pain.  Neurological: Negative for dizziness, seizures, speech difficulty, weakness, light-headedness and headaches.  Hematological: Negative for adenopathy.  Psychiatric/Behavioral: Positive for decreased concentration. Negative for behavioral problems, confusion and sleep disturbance. The patient is not nervous/anxious.     No past medical history on file.   Social History   Socioeconomic History  . Marital status: Single    Spouse name: Not on file  . Number of children: Not on file  . Years of education: Not on file  . Highest education level: Not on file  Occupational History  . Not on file  Social  Needs  . Financial resource strain: Not on file  . Food insecurity:    Worry: Not on file    Inability: Not on file  . Transportation needs:    Medical: Not on file    Non-medical: Not on file  Tobacco Use  . Smoking status: Current Every Day Smoker    Packs/day: 0.50    Years: 29.00    Pack years: 14.50    Types: Cigarettes  . Smokeless tobacco: Never Used  Substance and Sexual Activity  . Alcohol use: Yes    Comment: occasionally holidays  . Drug use: No  . Sexual activity: Yes  Lifestyle  . Physical activity:    Days per week: Not on file    Minutes per session: Not on file  . Stress: Not on file  Relationships  . Social connections:    Talks on phone: Not on file    Gets together: Not on file    Attends religious service: Not on file    Active member of club or organization: Not on file    Attends meetings of clubs or organizations: Not on file    Relationship status: Not on file  . Intimate partner violence:    Fear of current or ex partner: Not on file    Emotionally abused: Not on file    Physically abused: Not on file    Forced sexual activity: Not on file  Other Topics Concern  . Not on file  Social History Narrative  . Not on file  Past Surgical History:  Procedure Laterality Date  . UMBILICAL HERNIA REPAIR N/A 03/15/2015   Procedure: UMBILICAL HERNIA REPAIR WITH MESH;  Surgeon: Abigail Miyamoto, MD;  Location: [facility] OR;  Service: General;  Laterality: N/A;    No family history on file.  No Known Allergies  No current outpatient medications on file prior to visit.   No current facility-administered medications on file prior to visit.     BP (!) 150/90 Comment: second check.  Pulse 77   Temp 97.7 F (36.5 C) (Oral)   Resp 16   Ht 5\' 7"  (1.702 m)   Wt 246 lb 12.8 oz (111.9 kg)   SpO2 99%   BMI 38.65 kg/m       Objective:   Physical Exam  General Mental Status- Alert. General Appearance- Not in acute distress.   Skin General: Color-  Normal Color. Moisture- Normal Moisture.  Neck Carotid Arteries- Normal color. Moisture- Normal Moisture. No carotid bruits. No JVD.  Chest and Lung Exam Auscultation: Breath Sounds:-Normal.  Cardiovascular Auscultation:Rythm- Regular. Murmurs & Other Heart Sounds:Auscultation of the heart reveals- No Murmurs.  Abdomen Inspection:-Inspeection Normal. Palpation/Percussion:Note:No mass. Palpation and Percussion of the abdomen reveal- Non Tender, Non Distended + BS, no rebound or guarding.    Neurologic Cranial Nerve exam:- CN III-XII intact(No nystagmus), symmetric smile. Drift Test:- No drift. Romberg Exam:- Negative.  Heal to Toe Gait exam:-Normal. Finger to Nose:- Normal/Intact Strength:- 5/5 equal and symmetric strength both upper and lower extremities.     Assessment & Plan:  Your blood pressure is elevated today.  In the past it has been borderline elevated as well.  I do think it is best to go ahead and give you losartan 50 mg tablets.  Want you to start those and then follow-up for recheck in 2 weeks to see if we might need to increase the dose.  You do describe poor concentration and you filled out screening questionnaire for concentration today.  I also want you to see a psychologist for formal ADD evaluation/testing.(Patient had moderate high score.  Not many often or very often answers on the self-report scale.) Placed for scanning.  During the interim I am going to prescribe you Wellbutrin.  Hopefully at the higher dose you might get some improved concentration benefits and it will help you decrease or stop your smoking again.  Follow-up in 2 weeks for blood pressure check or as needed.  Esperanza Richters, PA-C

## 2019-01-28 ENCOUNTER — Ambulatory Visit: Payer: Self-pay | Admitting: Medical

## 2020-12-28 ENCOUNTER — Encounter (HOSPITAL_COMMUNITY): Payer: Self-pay | Admitting: Neurology

## 2020-12-28 ENCOUNTER — Inpatient Hospital Stay (HOSPITAL_COMMUNITY): Payer: Medicaid Other

## 2020-12-28 ENCOUNTER — Emergency Department (HOSPITAL_COMMUNITY): Payer: Medicaid Other

## 2020-12-28 ENCOUNTER — Inpatient Hospital Stay (HOSPITAL_COMMUNITY)
Admission: EM | Admit: 2020-12-28 | Discharge: 2021-01-02 | DRG: 062 | Disposition: A | Discharge status: Rehabilitation Facility | Payer: Medicaid Other | Attending: Neurology | Admitting: Neurology

## 2020-12-28 ENCOUNTER — Other Ambulatory Visit: Payer: Self-pay

## 2020-12-28 DIAGNOSIS — R29810 Facial weakness: Secondary | ICD-10-CM | POA: Diagnosis not present

## 2020-12-28 DIAGNOSIS — R471 Dysarthria and anarthria: Secondary | ICD-10-CM | POA: Diagnosis not present

## 2020-12-28 DIAGNOSIS — I361 Nonrheumatic tricuspid (valve) insufficiency: Secondary | ICD-10-CM | POA: Diagnosis not present

## 2020-12-28 DIAGNOSIS — Z79899 Other long term (current) drug therapy: Secondary | ICD-10-CM | POA: Diagnosis not present

## 2020-12-28 DIAGNOSIS — J329 Chronic sinusitis, unspecified: Secondary | ICD-10-CM | POA: Diagnosis not present

## 2020-12-28 DIAGNOSIS — G4733 Obstructive sleep apnea (adult) (pediatric): Secondary | ICD-10-CM | POA: Diagnosis not present

## 2020-12-28 DIAGNOSIS — I63311 Cerebral infarction due to thrombosis of right middle cerebral artery: Secondary | ICD-10-CM | POA: Diagnosis not present

## 2020-12-28 DIAGNOSIS — F17210 Nicotine dependence, cigarettes, uncomplicated: Secondary | ICD-10-CM | POA: Diagnosis present

## 2020-12-28 DIAGNOSIS — Q211 Atrial septal defect: Secondary | ICD-10-CM | POA: Diagnosis not present

## 2020-12-28 DIAGNOSIS — I6381 Other cerebral infarction due to occlusion or stenosis of small artery: Secondary | ICD-10-CM | POA: Diagnosis not present

## 2020-12-28 DIAGNOSIS — R0789 Other chest pain: Secondary | ICD-10-CM

## 2020-12-28 DIAGNOSIS — Z6841 Body Mass Index (BMI) 40.0 and over, adult: Secondary | ICD-10-CM | POA: Diagnosis not present

## 2020-12-28 DIAGNOSIS — Z23 Encounter for immunization: Secondary | ICD-10-CM | POA: Diagnosis not present

## 2020-12-28 DIAGNOSIS — J9811 Atelectasis: Secondary | ICD-10-CM | POA: Diagnosis not present

## 2020-12-28 DIAGNOSIS — I639 Cerebral infarction, unspecified: Secondary | ICD-10-CM | POA: Diagnosis present

## 2020-12-28 DIAGNOSIS — I10 Essential (primary) hypertension: Secondary | ICD-10-CM | POA: Diagnosis present

## 2020-12-28 DIAGNOSIS — R29710 NIHSS score 10: Secondary | ICD-10-CM | POA: Diagnosis present

## 2020-12-28 DIAGNOSIS — E119 Type 2 diabetes mellitus without complications: Secondary | ICD-10-CM | POA: Diagnosis not present

## 2020-12-28 DIAGNOSIS — E785 Hyperlipidemia, unspecified: Secondary | ICD-10-CM | POA: Diagnosis not present

## 2020-12-28 DIAGNOSIS — I169 Hypertensive crisis, unspecified: Secondary | ICD-10-CM | POA: Diagnosis not present

## 2020-12-28 DIAGNOSIS — I6389 Other cerebral infarction: Secondary | ICD-10-CM | POA: Diagnosis not present

## 2020-12-28 DIAGNOSIS — Z20822 Contact with and (suspected) exposure to covid-19: Secondary | ICD-10-CM | POA: Diagnosis not present

## 2020-12-28 DIAGNOSIS — G8194 Hemiplegia, unspecified affecting left nondominant side: Secondary | ICD-10-CM | POA: Diagnosis not present

## 2020-12-28 DIAGNOSIS — I6300 Cerebral infarction due to thrombosis of unspecified precerebral artery: Secondary | ICD-10-CM | POA: Diagnosis not present

## 2020-12-28 DIAGNOSIS — J3489 Other specified disorders of nose and nasal sinuses: Secondary | ICD-10-CM | POA: Diagnosis not present

## 2020-12-28 DIAGNOSIS — I340 Nonrheumatic mitral (valve) insufficiency: Secondary | ICD-10-CM | POA: Diagnosis not present

## 2020-12-28 HISTORY — DX: Essential (primary) hypertension: I10

## 2020-12-28 HISTORY — DX: Morbid (severe) obesity due to excess calories: E66.01

## 2020-12-28 LAB — I-STAT CHEM 8, ED
BUN: 14 mg/dL (ref 6–20)
Calcium, Ion: 1.12 mmol/L — ABNORMAL LOW (ref 1.15–1.40)
Chloride: 105 mmol/L (ref 98–111)
Creatinine, Ser: 1 mg/dL (ref 0.61–1.24)
Glucose, Bld: 191 mg/dL — ABNORMAL HIGH (ref 70–99)
HCT: 49 % (ref 39.0–52.0)
Hemoglobin: 16.7 g/dL (ref 13.0–17.0)
Potassium: 3.5 mmol/L (ref 3.5–5.1)
Sodium: 142 mmol/L (ref 135–145)
TCO2: 24 mmol/L (ref 22–32)

## 2020-12-28 LAB — APTT: aPTT: 26 seconds (ref 24–36)

## 2020-12-28 LAB — COMPREHENSIVE METABOLIC PANEL
ALT: 29 U/L (ref 0–44)
AST: 25 U/L (ref 15–41)
Albumin: 3.3 g/dL — ABNORMAL LOW (ref 3.5–5.0)
Alkaline Phosphatase: 83 U/L (ref 38–126)
Anion gap: 11 (ref 5–15)
BUN: 13 mg/dL (ref 6–20)
CO2: 22 mmol/L (ref 22–32)
Calcium: 8.8 mg/dL — ABNORMAL LOW (ref 8.9–10.3)
Chloride: 104 mmol/L (ref 98–111)
Creatinine, Ser: 1.16 mg/dL (ref 0.61–1.24)
GFR, Estimated: >60 mL/min (ref >60)
Glucose, Bld: 194 mg/dL — ABNORMAL HIGH (ref 70–99)
Potassium: 3.5 mmol/L (ref 3.5–5.1)
Sodium: 137 mmol/L (ref 135–145)
Total Bilirubin: 0.6 mg/dL (ref 0.3–1.2)
Total Protein: 6.6 g/dL (ref 6.5–8.1)

## 2020-12-28 LAB — CBC
HCT: 49.6 % (ref 39.0–52.0)
Hemoglobin: 16.4 g/dL (ref 13.0–17.0)
MCH: 29.4 pg (ref 26.0–34.0)
MCHC: 33.1 g/dL (ref 30.0–36.0)
MCV: 89 fL (ref 80.0–100.0)
Platelets: 187 10*3/uL (ref 150–400)
RBC: 5.57 MIL/uL (ref 4.22–5.81)
RDW: 12.7 % (ref 11.5–15.5)
WBC: 13.8 10*3/uL — ABNORMAL HIGH (ref 4.0–10.5)
nRBC: 0 % (ref 0.0–0.2)

## 2020-12-28 LAB — DIFFERENTIAL
Abs Immature Granulocytes: 0.05 10*3/uL (ref 0.00–0.07)
Basophils Absolute: 0.1 10*3/uL (ref 0.0–0.1)
Basophils Relative: 0 %
Eosinophils Absolute: 0.2 10*3/uL (ref 0.0–0.5)
Eosinophils Relative: 2 %
Immature Granulocytes: 0 %
Lymphocytes Relative: 32 %
Lymphs Abs: 4.4 10*3/uL — ABNORMAL HIGH (ref 0.7–4.0)
Monocytes Absolute: 1 10*3/uL (ref 0.1–1.0)
Monocytes Relative: 7 %
Neutro Abs: 8.1 10*3/uL — ABNORMAL HIGH (ref 1.7–7.7)
Neutrophils Relative %: 59 %

## 2020-12-28 LAB — TROPONIN I (HIGH SENSITIVITY)
Troponin I (High Sensitivity): 17 ng/L (ref <18)
Troponin I (High Sensitivity): 16 ng/L (ref <18)

## 2020-12-28 LAB — CBG MONITORING, ED: Glucose-Capillary: 194 mg/dL — ABNORMAL HIGH (ref 70–99)

## 2020-12-28 LAB — HIV ANTIBODY (ROUTINE TESTING W REFLEX): HIV Screen 4th Generation wRfx: NONREACTIVE

## 2020-12-28 LAB — RESP PANEL BY RT-PCR (FLU A&B, COVID) ARPGX2
Influenza A by PCR: NEGATIVE
Influenza B by PCR: NEGATIVE
SARS Coronavirus 2 by RT PCR: NEGATIVE

## 2020-12-28 LAB — PROTIME-INR
INR: 1 (ref 0.8–1.2)
Prothrombin Time: 13.2 seconds (ref 11.4–15.2)

## 2020-12-28 IMAGING — CT CT HEAD CODE STROKE
4 series · 16 of 47 positions shown, 18 images · non-contrast
Comparison: None.

CLINICAL DATA: Code stroke.

EXAM:
CT HEAD WITHOUT CONTRAST
TECHNIQUE: Contiguous axial images were obtained from the base of the skull
through the vertex without intravenous contrast.

[Series 3: head wo · axial · 0.44mm/px · z∈[-157,-42]mm · 7 of 31 slices shown, 9 images]
[im 4/31  brain]
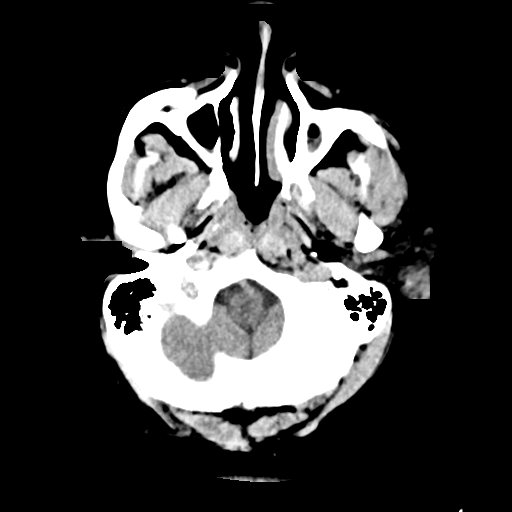
[im 4/31  bone]
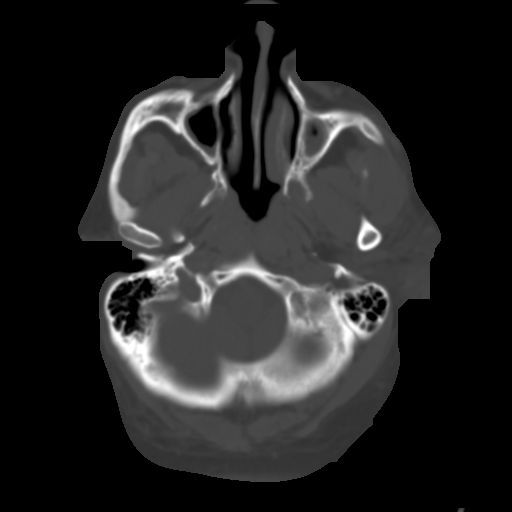
[im 8/31  brain]
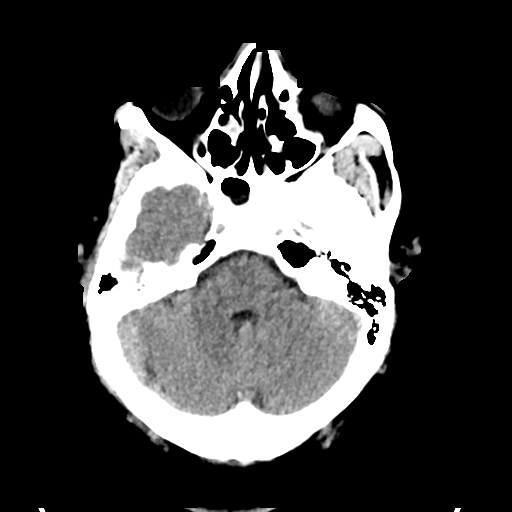
[im 12/31  brain]
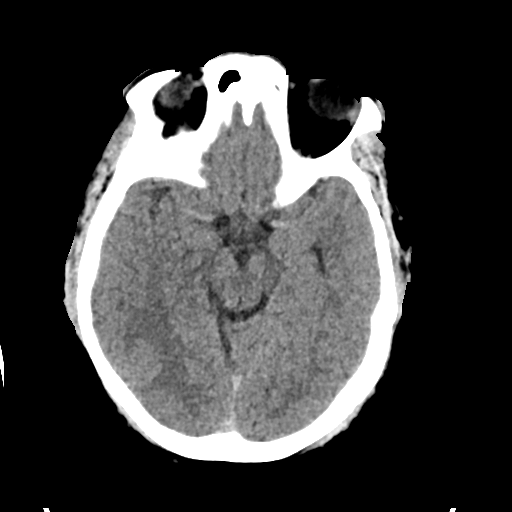
[im 16/31  brain]
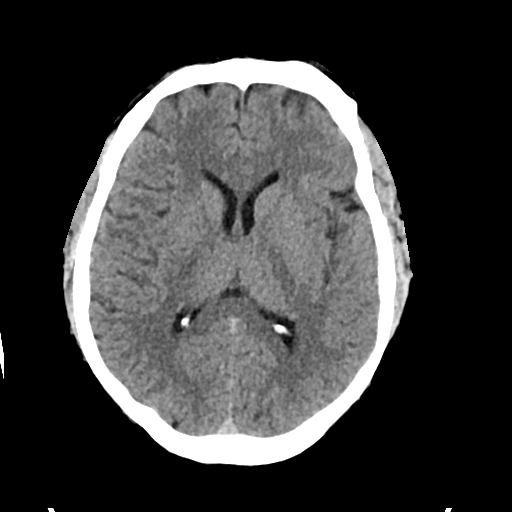
[im 19/31  brain]
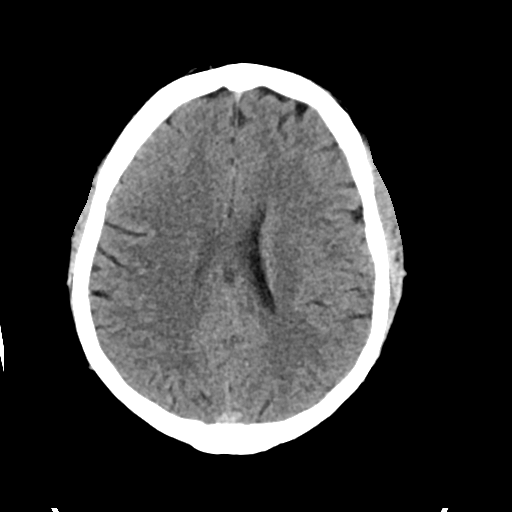
[im 19/31  bone]
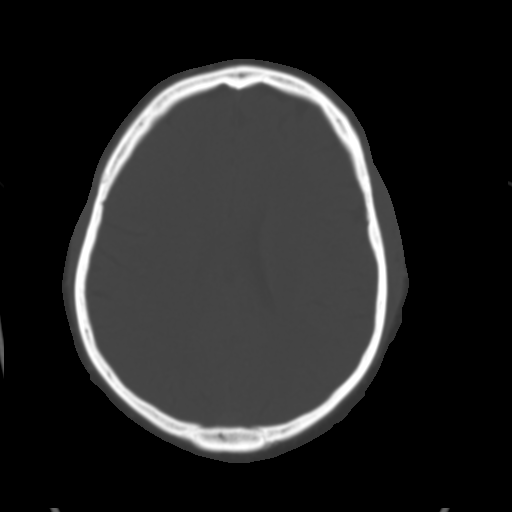
[im 23/31  brain]
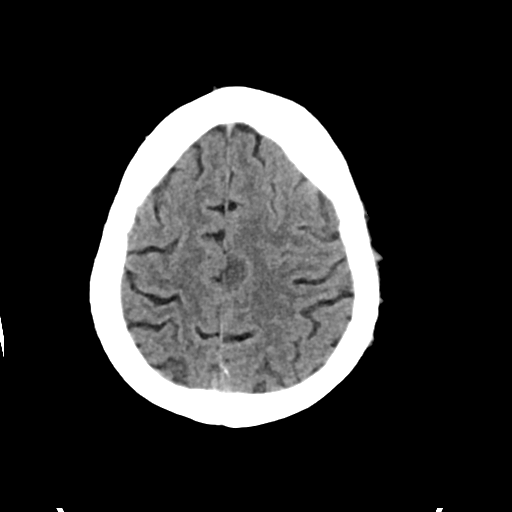
[im 27/31  brain]
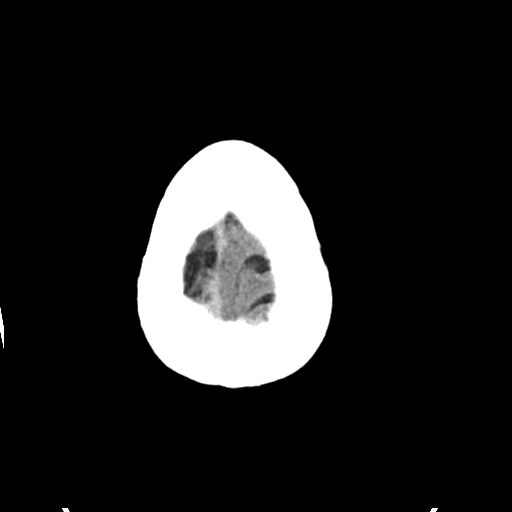

[Series 4: head bone · axial · 0.44mm/px · z∈[-158,-128]mm · 3 of 76 slices shown]
[im 8/76  bone]
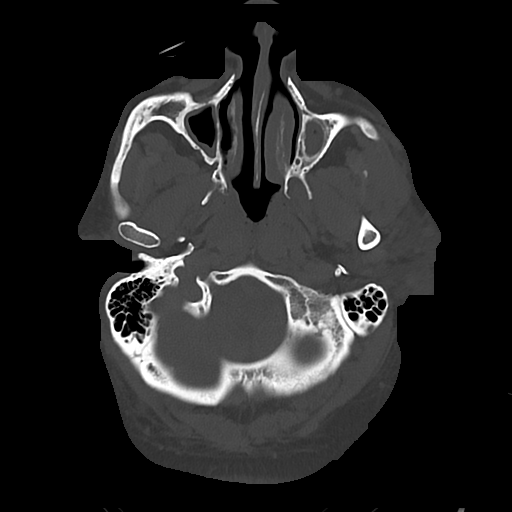
[im 16/76  bone]
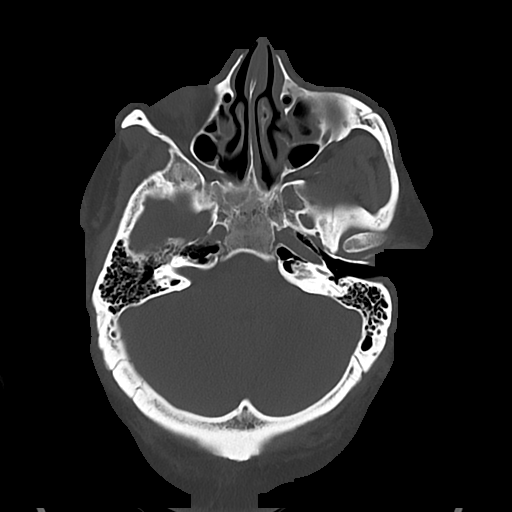
[im 23/76  bone]
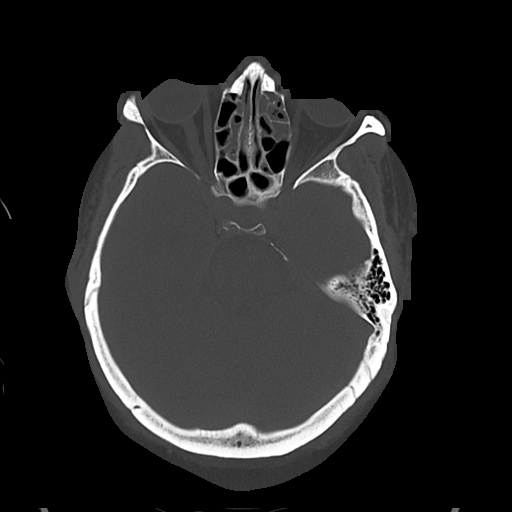

[Series 5: cor soft · coronal · 0.34mm/px · 3 of 67 slices shown]
[im 23/67  brain]
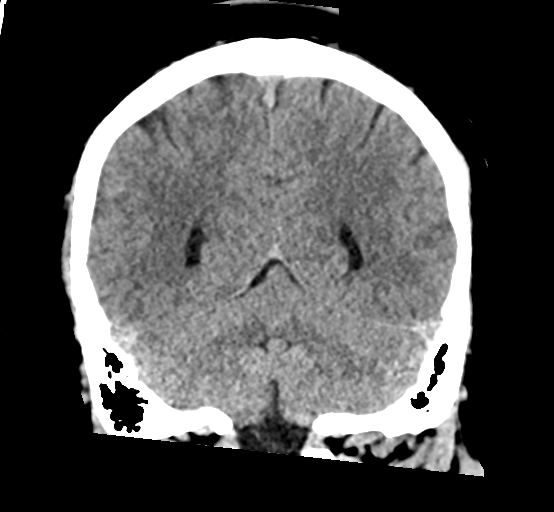
[im 30/67  brain]
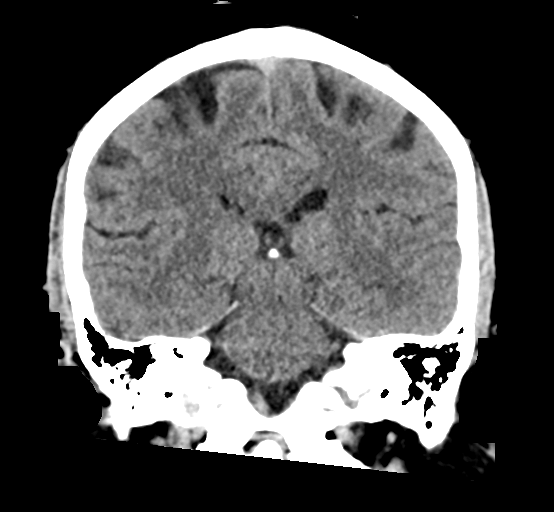
[im 37/67  brain]
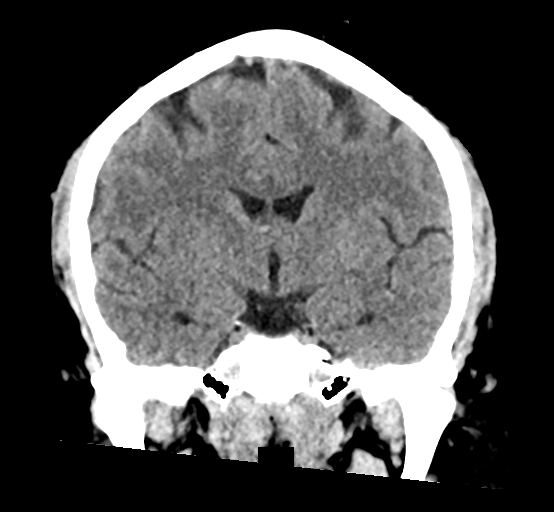

[Series 6: sag soft · sagittal · 0.37mm/px · 3 of 57 slices shown]
[im 19/57  brain]
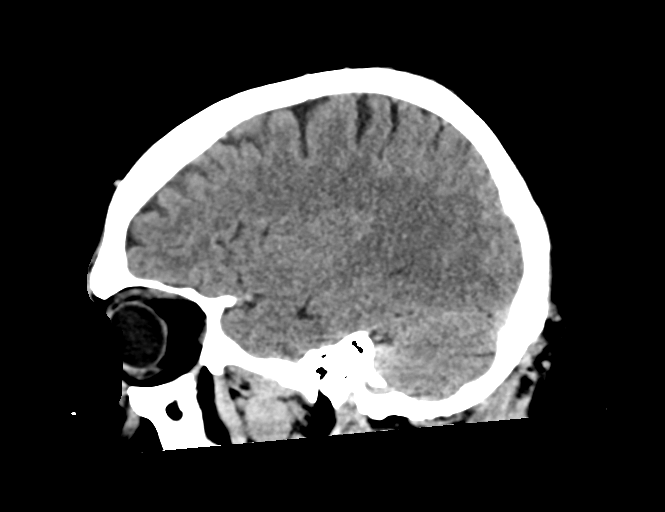
[im 29/57  brain]
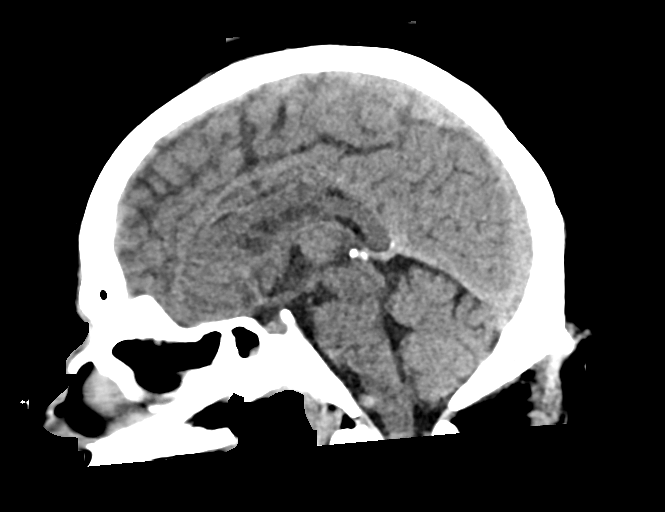
[im 38/57  brain]
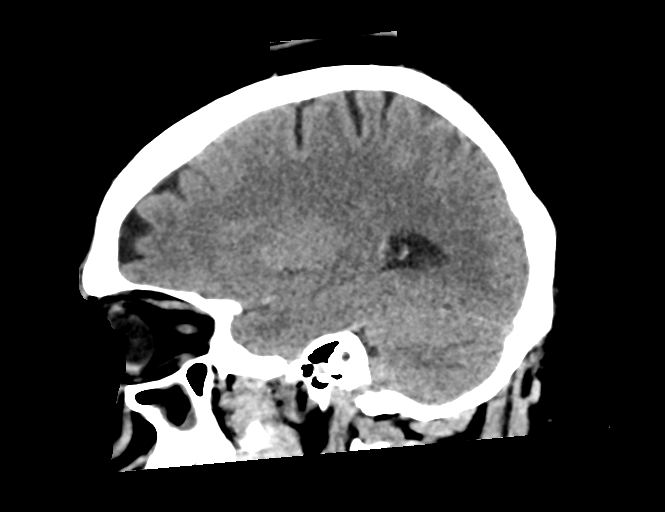

[16 of 47 positions shown; findings below may reference images not displayed]

FINDINGS: Brain: No evidence of acute infarction, hemorrhage, hydrocephalus,
extra-axial collection or mass lesion/mass effect.

Vascular: No hyperdense vessel.

Skull: Normal. Negative for fracture or focal lesion.

Sinuses/Orbits: Mucosal thickening throughout the paranasal sinuses
with fluid level within the sphenoid and maxillary sinuses. The
orbits are maintained.

ASPECTS (Alberta Stroke Program Early CT Score)

- Ganglionic level infarction (caudate, lentiform nuclei, internal
capsule, insula, M1-M3 cortex): 7

- Supraganglionic infarction (M4-M6 cortex): 3

Total score (0-10 with 10 being normal): 10
IMPRESSION: 1. No evidence of acute intracranial abnormality.
2. ASPECTS is 10.
3. Mucosal thickening throughout the paranasal sinuses with fluid
level within the sphenoid and maxillary sinuses. Correlate for acute
sinusitis.

Results communicated to Dr. RAYA on [DATE] at [DATE] a.m.
via [employer] paging system.

## 2020-12-28 IMAGING — MR MR HEAD W/O CM
7 series · 48 of 48 positions shown · non-contrast
Comparison: CT studies earlier same day

CLINICAL DATA: Stroke presentation.  Left-sided deficit.

EXAM:
MRI HEAD WITHOUT CONTRAST
TECHNIQUE: Multiplanar, multiecho pulse sequences of the brain and surrounding
structures were obtained without intravenous contrast.

[Series 5: DWI · axial · 3.0mm · 0.88mm/px · z∈[-125,+24]mm · 14 of 104 slices shown (1 of 4)]
[im 1/104]
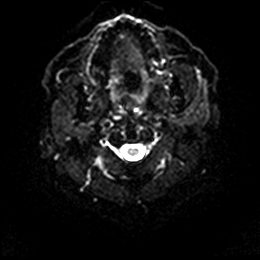
[im 8/104]
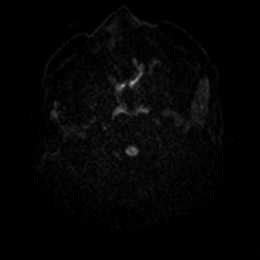
[im 16/104]
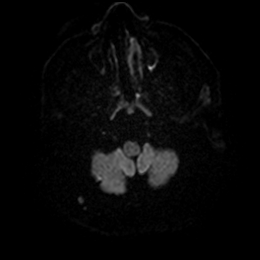
[im 24/104]
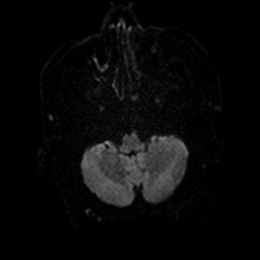
[im 32/104]
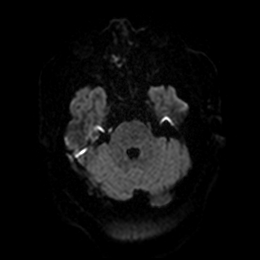
[im 40/104]
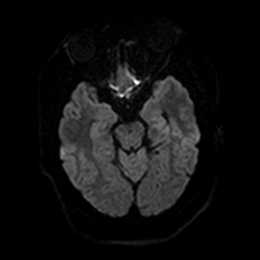
[im 48/104]
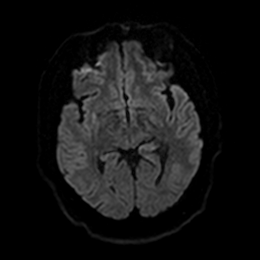
[im 56/104]
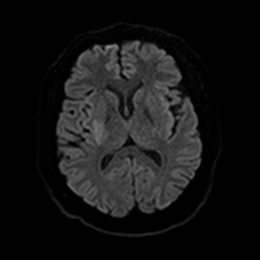
[im 64/104]
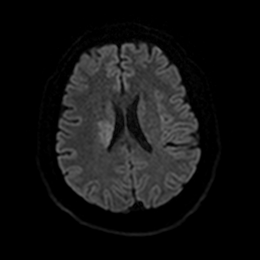
[im 72/104]
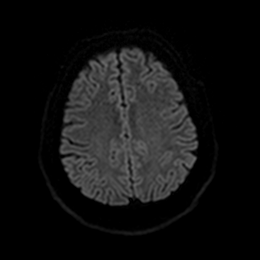
[im 80/104]
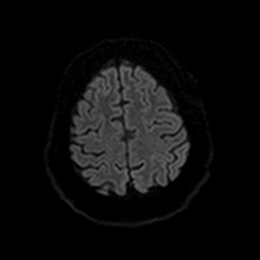
[im 88/104]
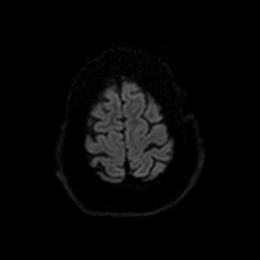
[im 96/104]
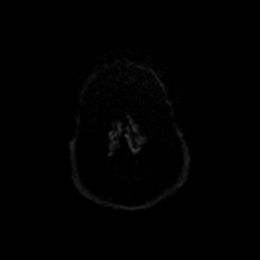
[im 104/104]
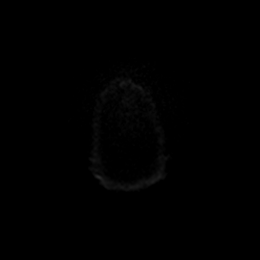

[Series 6: DWI · axial · 3.0mm · 0.88mm/px · z∈[-125,+24]mm · 7 of 51 slices shown (2 of 4)]
[im 1/51]
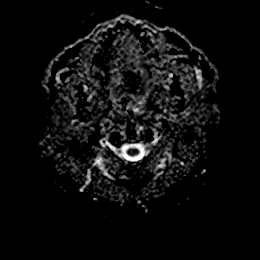
[im 9/51]
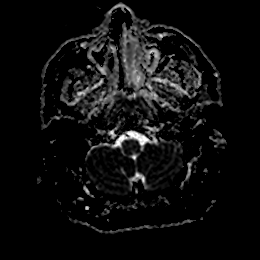
[im 17/51]
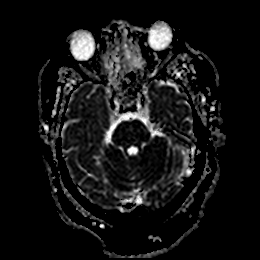
[im 26/51]
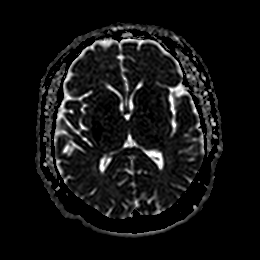
[im 34/51]
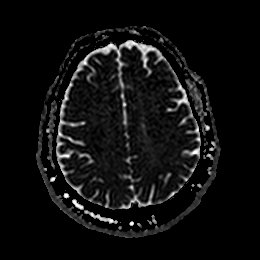
[im 42/51]
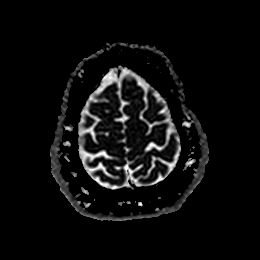
[im 51/51]
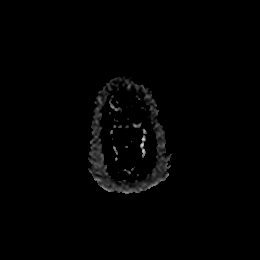

[Series 7: DWI · coronal · 4.0mm · 0.88mm/px · 10 of 68 slices shown (3 of 4)]
[im 1/68]
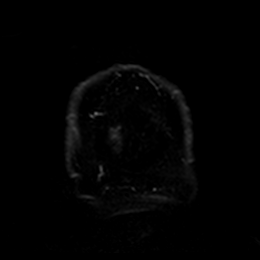
[im 8/68]
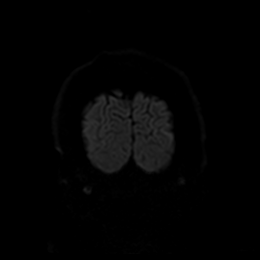
[im 15/68]
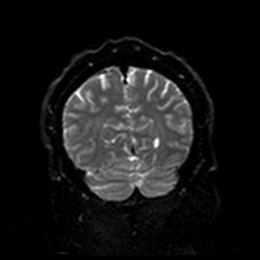
[im 23/68]
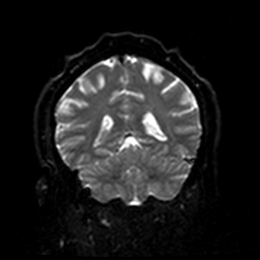
[im 30/68]
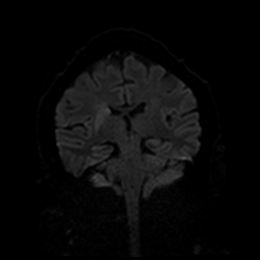
[im 38/68]
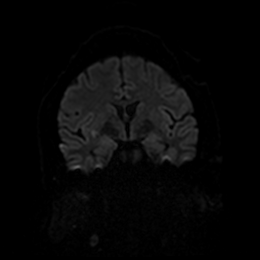
[im 45/68]
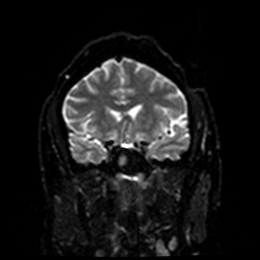
[im 53/68]
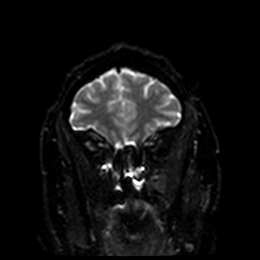
[im 60/68]
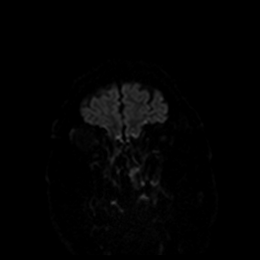
[im 68/68]
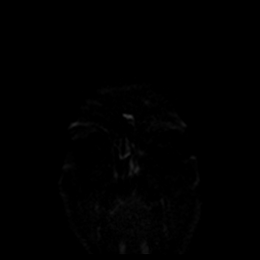

[Series 8: DWI · coronal · 4.0mm · 0.88mm/px · 5 of 34 slices shown (4 of 4)]
[im 1/34]
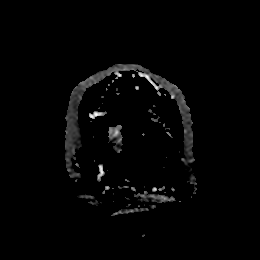
[im 9/34]
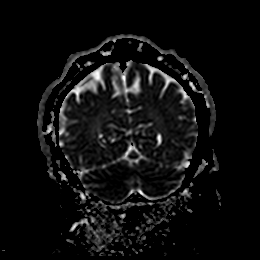
[im 17/34]
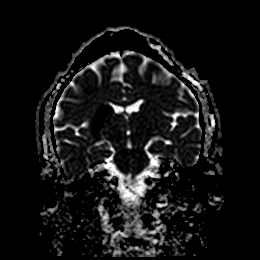
[im 25/34]
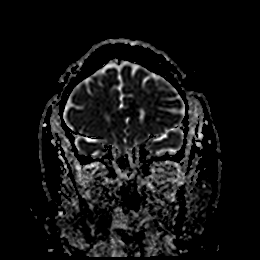
[im 34/34]
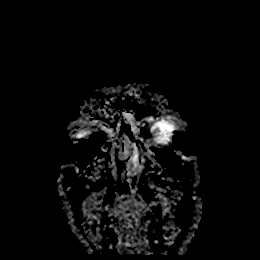

[Series 9: FLAIR · axial · 5.0mm · 0.45mm/px · z∈[-136,+18]mm · 4 of 27 slices shown]
[im 1/27]
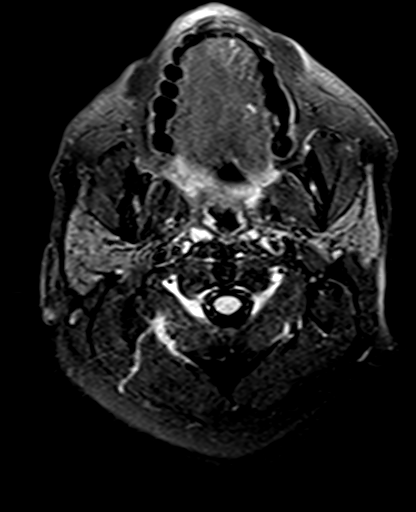
[im 9/27]
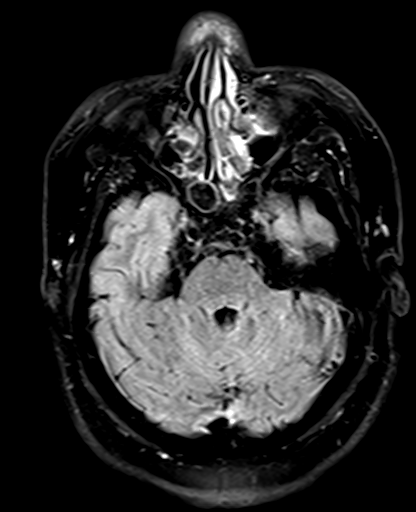
[im 18/27]
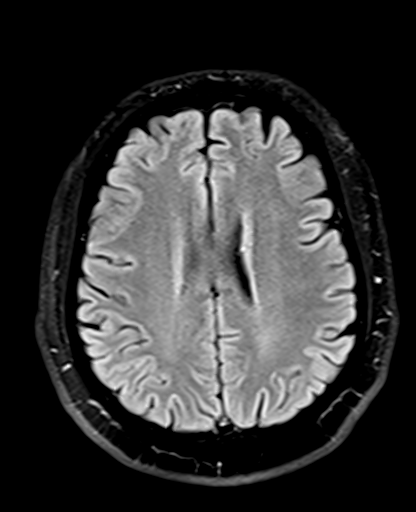
[im 27/27]
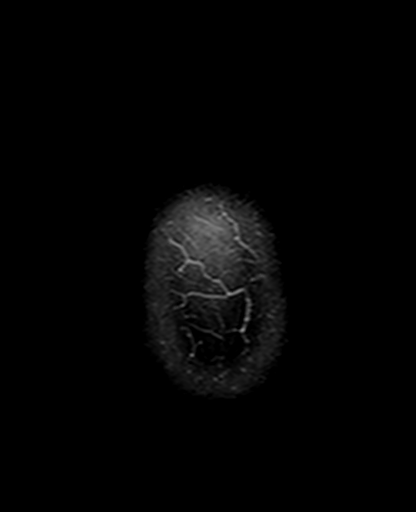

[Series 10: T2 · axial · 5.0mm · 0.72mm/px · z∈[-101,+49]mm · 4 of 27 slices shown (1 of 2)]
[im 1/27]
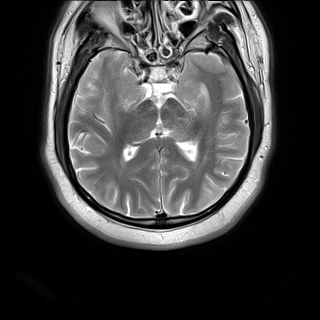
[im 9/27]
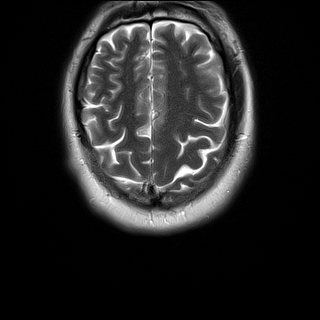
[im 18/27]
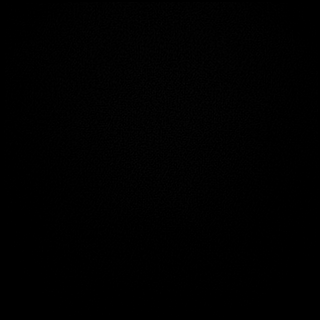
[im 27/27]
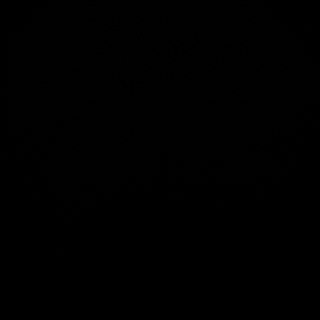

[Series 11: T2 · axial · 5.0mm · 0.72mm/px · z∈[-159,-3]mm · 4 of 27 slices shown (2 of 2)]
[im 1/27]
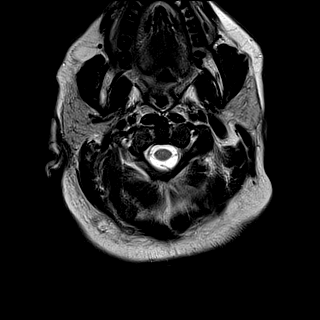
[im 9/27]
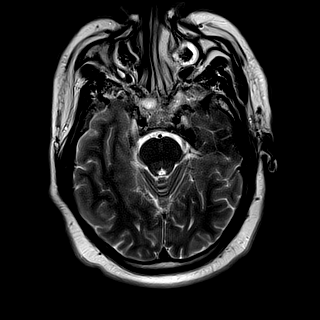
[im 18/27]
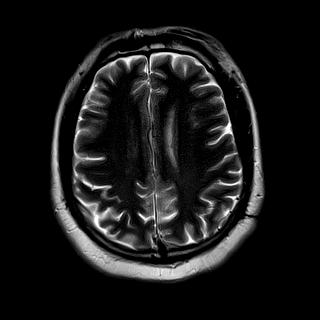
[im 27/27]
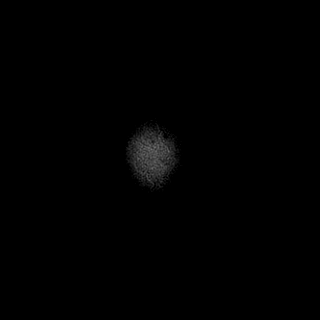

[48 of 48 positions shown; findings below may reference images not displayed]

FINDINGS: Brain: Diffusion imaging shows hyperacute infarction within the
posterior putamen, posterior body of the caudate and radiating white
matter tracts on the right. Early T2 signal abnormality. No mass
effect or hemorrhage. The remainder the brain appears normal without
evidence of prior insult. No hydrocephalus or extra-axial
collection.

Vascular: Major vessels at the base of the brain show flow.

Skull and upper cervical spine: Negative

Sinuses/Orbits: Mucosal inflammatory changes of the paranasal
sinuses, which are small on a developmental basis. Orbits negative.

Other: None
IMPRESSION: 1. Acute infarction within the posterior putamen, posterior body of
the caudate and radiating white matter tracts on the right. No
hemorrhage or mass effect. The remainder the brain appears normal
without evidence of prior insult.
2. Mucosal inflammatory changes of the paranasal sinuses, which are
small on a developmental basis.

## 2020-12-28 IMAGING — DX DG CHEST 1V PORT
1 series · 1 of 1 positions shown · non-contrast
Comparison: Chest x-ray [DATE].

CLINICAL DATA: Chest pain.

EXAM:
PORTABLE CHEST 1 VIEW

[chest]
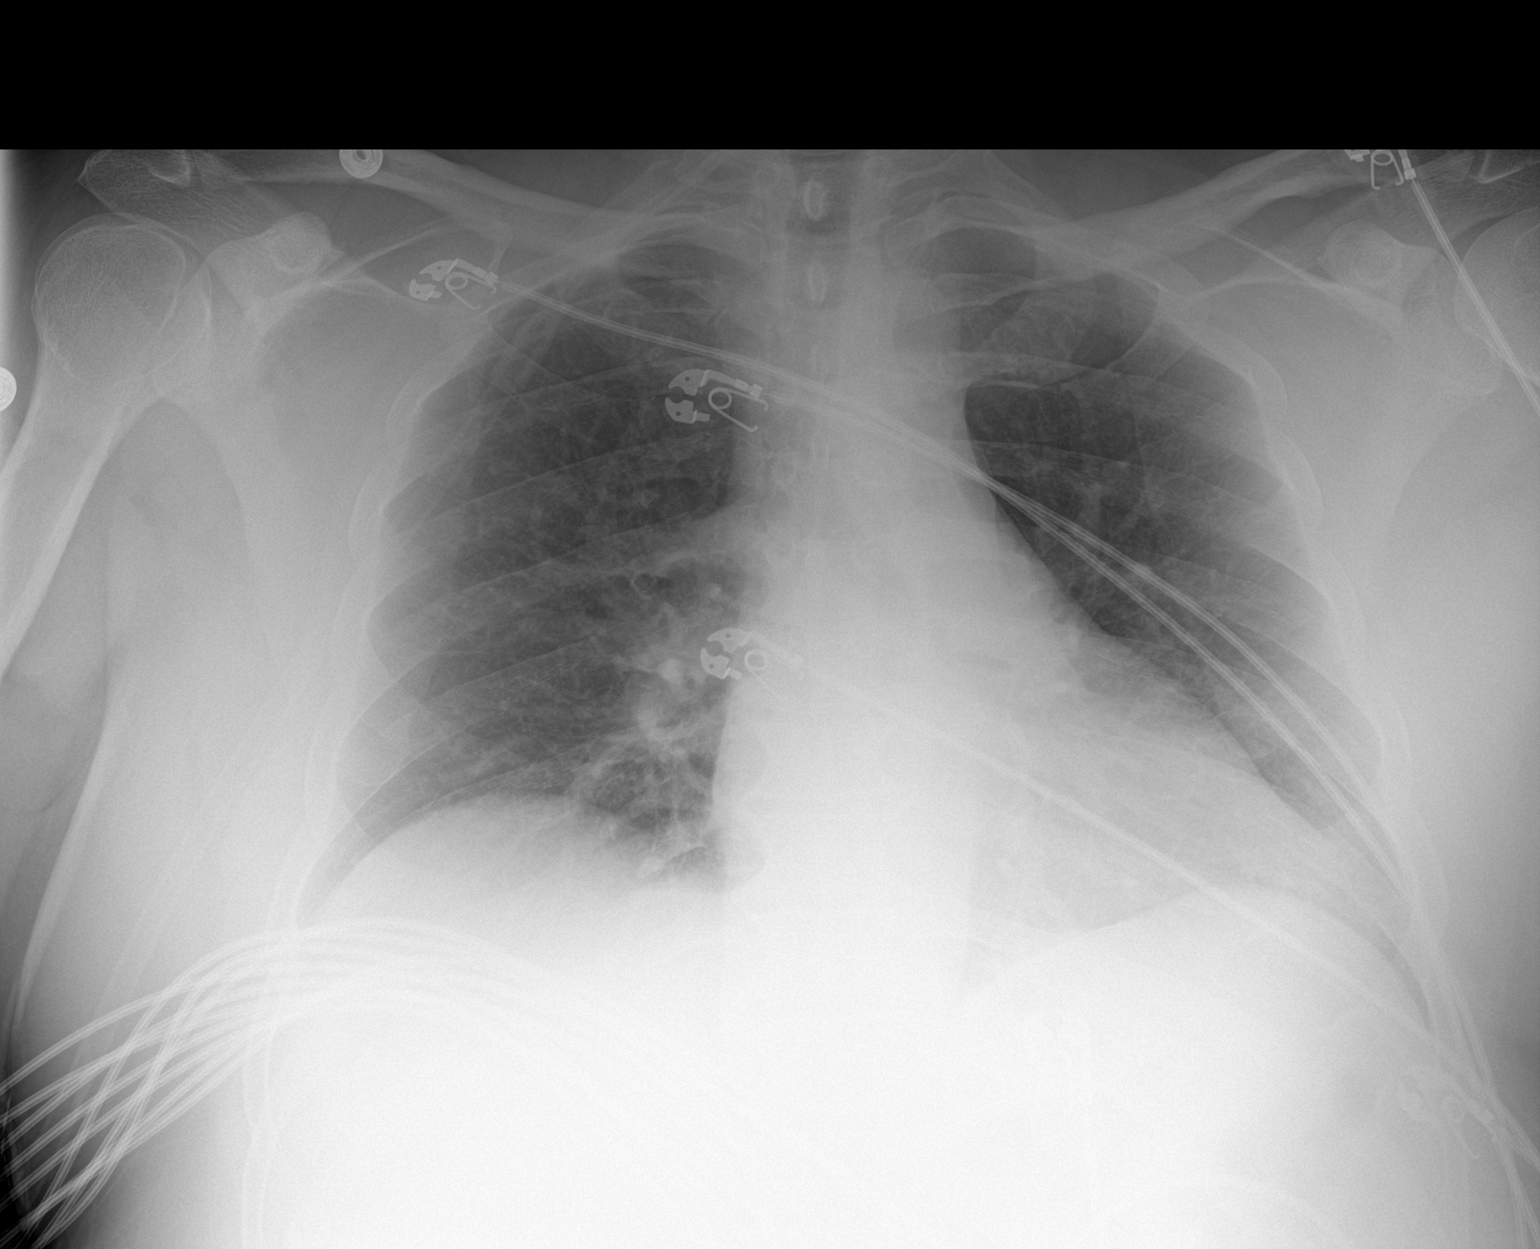

[1 of 1 positions shown; findings below may reference images not displayed]

FINDINGS: Mediastinum and hilar structures normal. Heart size normal. Low lung
volumes with mild bibasilar atelectasis. No pleural effusion or
pneumothorax.
IMPRESSION: Low lung volumes with mild bibasilar atelectasis.

## 2020-12-28 IMAGING — CT CT ANGIO HEAD-NECK
3 of 7 series · 10 of 36 positions shown · IV contrast (APPLIED)
Comparison: None.

Head CT [DATE].

CLINICAL DATA: Stroke.  Left-sided deficit.

EXAM:
CT ANGIOGRAPHY HEAD AND NECK
TECHNIQUE: Multidetector CT imaging of the head and neck was performed using
the standard protocol during bolus administration of intravenous
contrast. Multiplanar CT image reconstructions and MIPs were
obtained to evaluate the vascular anatomy. Carotid stenosis
measurements (when applicable) are obtained utilizing NASCET
criteria, using the distal internal carotid diameter as the
denominator.

[Series 5: cta neck/head · axial · 0.49mm/px · z∈[-239,-131]mm · 2 of 162 slices shown]
[im 54/162  soft-tissue]
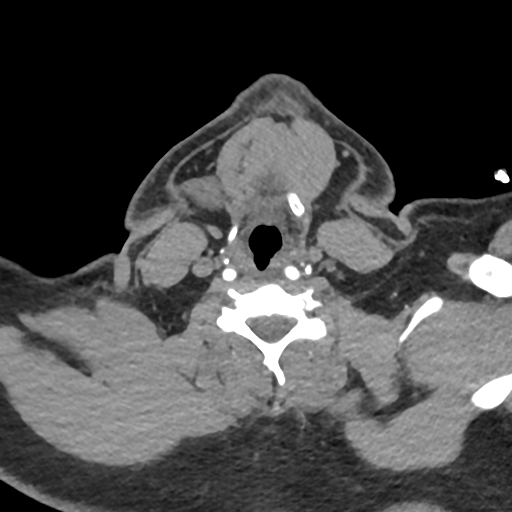
[im 108/162  soft-tissue]
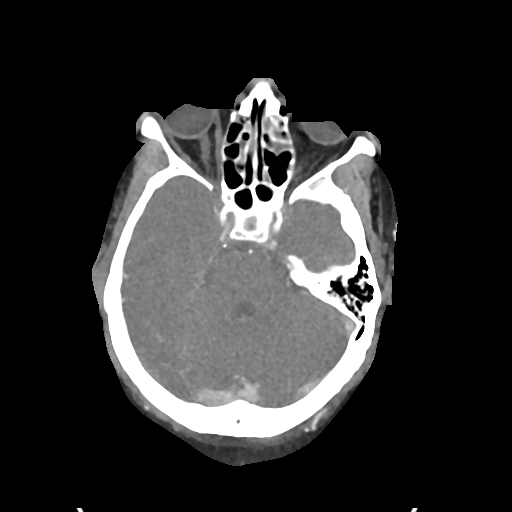

[Series 7: ax thins · axial · 0.54mm/px · z∈[-310,-77]mm · 6 of 327 slices shown]
[im 47/327  soft-tissue]
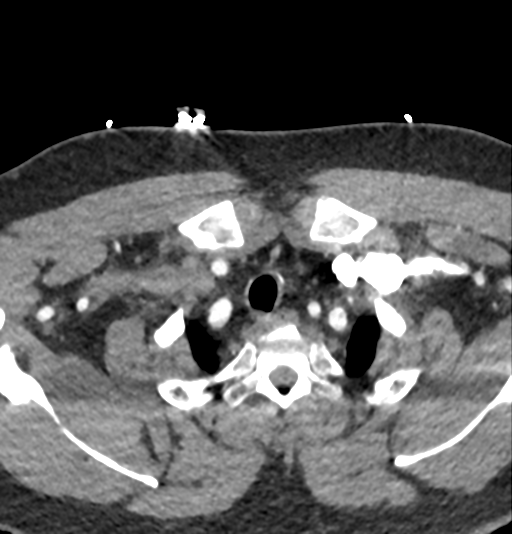
[im 94/327  bone]
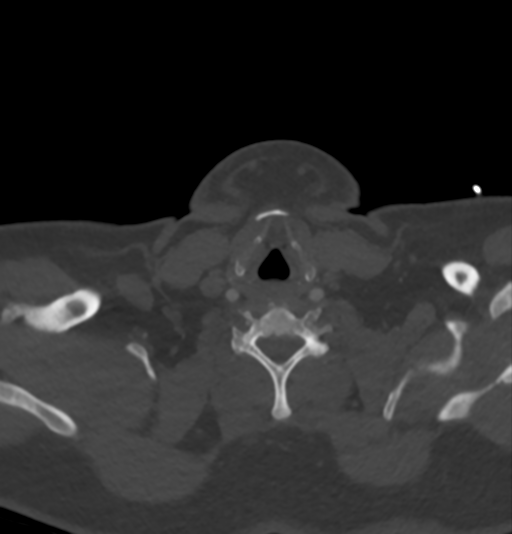
[im 140/327  soft-tissue]
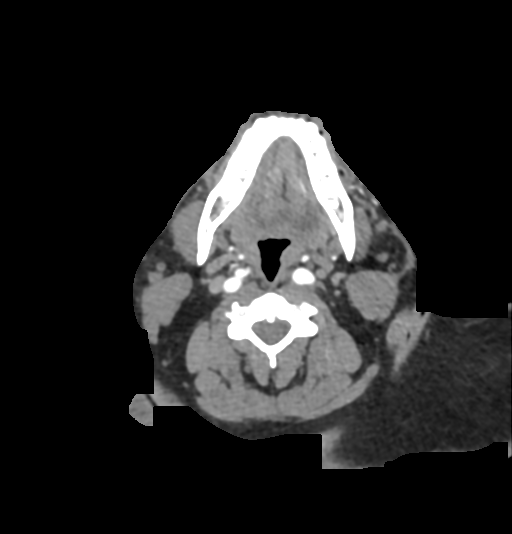
[im 187/327  bone]
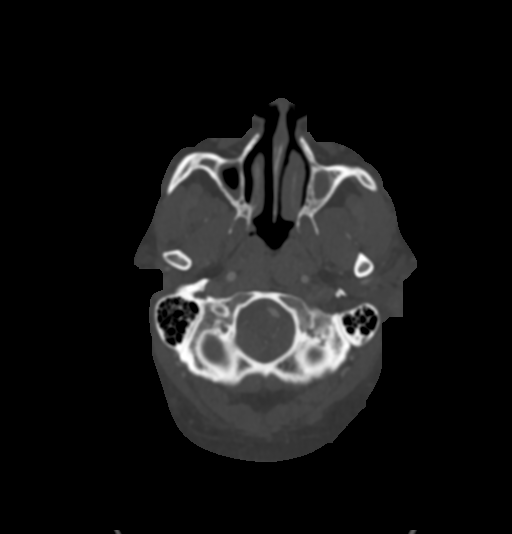
[im 233/327  soft-tissue]
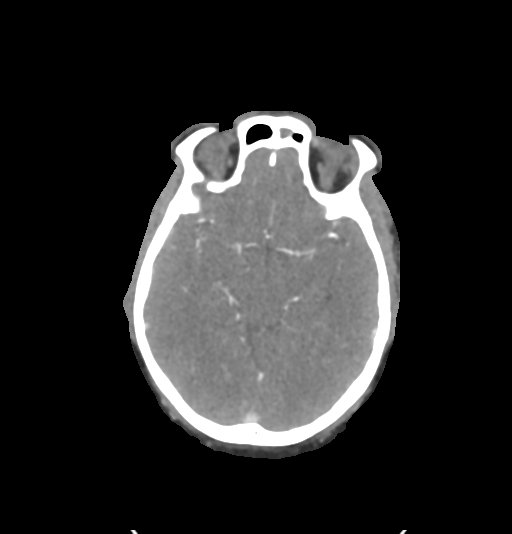
[im 280/327  bone]
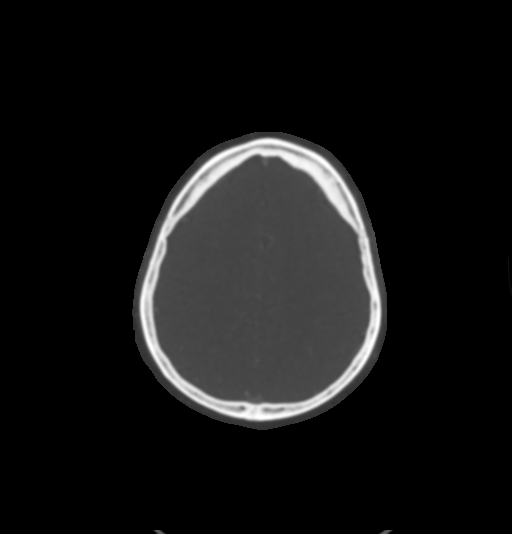

[Series 9: sag thins · sagittal · 0.55mm/px · 2 of 182 slices shown]
[im 43/182  soft-tissue]
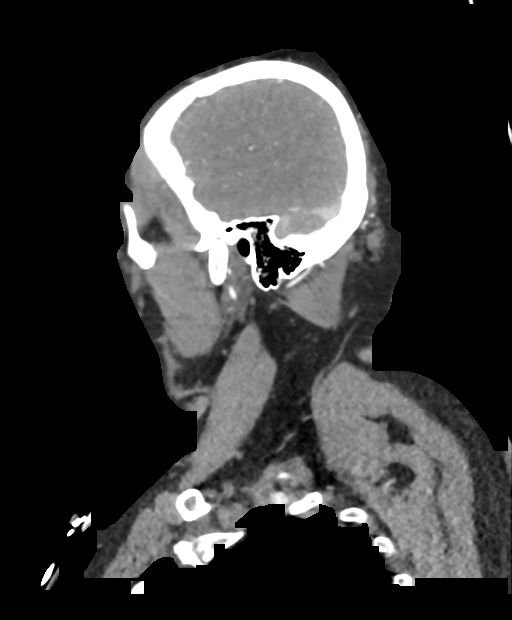
[im 140/182  soft-tissue]
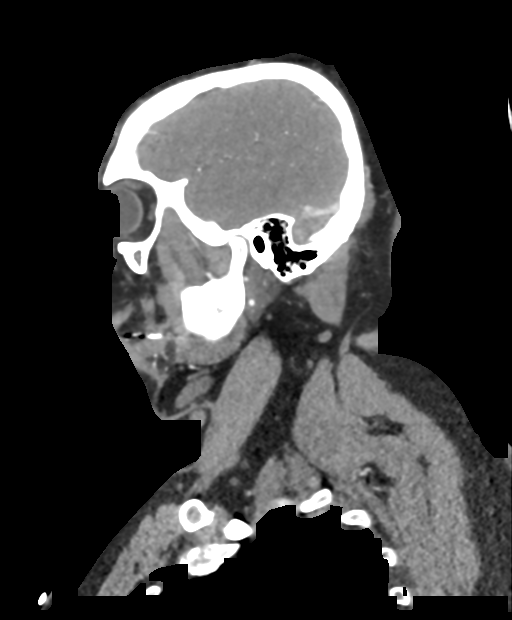

[10 of 36 positions shown; findings below may reference images not displayed]

FINDINGS: CTA NECK FINDINGS

Aortic arch: Common origin of the innominate and left common carotid
artery from the aortic arch. Imaged portion shows no evidence of
aneurysm or dissection. No significant stenosis of the major arch
vessel origins.

Right carotid system: Minimal atherosclerotic changes in the right
carotid bifurcation. No evidence of dissection, stenosis (50% or
greater) or occlusion.

Left carotid system: No evidence of dissection, stenosis (50% or
greater) or occlusion.

Vertebral arteries: The right vertebral artery is hypoplastic and
ends in PICA. The dominant left vertebral artery has no evidence of
dissection, stenosis or occlusion.

Skeleton: Negative.

Other neck: Negative

Upper chest: Negative.

Review of the MIP images confirms the above findings

CTA HEAD FINDINGS

Anterior circulation: Subtle luminal irregularity throughout the
bilateral MCA and ACA vascular tree may represent mild intracranial
atherosclerotic disease. No significant stenosis, proximal
occlusion, aneurysm, or vascular malformation.

Posterior circulation: The right vertebral artery ends in PICA. The
left vertebral artery is dominant and has normal caliber. The
basilar artery has small caliber throughout its entire course.
Hypoplastic right P1/PCA with prominent right posterior
communicating artery. Mild luminal irregularities in the bilateral
PCAs may represent intracranial atherosclerotic disease without
hemodynamically significant stenosis.

Venous sinuses: As permitted by contrast timing, patent.

Anatomic variants: Hypoplastic right P1 segment.
IMPRESSION: 1. No large vessel occlusion or significant stenosis in the neck.
2. Subtle luminal irregularity throughout the bilateral MCA, ACA and
PCA vascular tree may represent mild intracranial atherosclerotic
disease without hemodynamically significant stenosis.

## 2020-12-28 MED ORDER — SODIUM CHLORIDE 0.9% FLUSH
3.0000 mL | Freq: Once | INTRAVENOUS | Status: AC
  Administered 2020-12-28: 3 mL via INTRAVENOUS

## 2020-12-28 MED ORDER — STROKE: EARLY STAGES OF RECOVERY BOOK: Freq: Once | Status: AC

## 2020-12-28 MED ORDER — SODIUM CHLORIDE 0.9 % IV SOLN: INTRAVENOUS | Status: DC

## 2020-12-28 MED ORDER — METHOCARBAMOL 1000 MG/10ML IJ SOLN
1000.0000 mg | Freq: Four times a day (QID) | INTRAVENOUS | Status: DC | PRN
  Administered 2020-12-28 – 2020-12-31 (×6): 1000 mg via INTRAVENOUS
  Filled 2020-12-28 (×6): qty 10
  Filled 2020-12-28: qty 1000
  Filled 2020-12-28 (×2): qty 10

## 2020-12-28 MED ORDER — IOHEXOL 350 MG/ML SOLN
60.0000 mL | Freq: Once | INTRAVENOUS | Status: AC | PRN
  Administered 2020-12-28: 60 mL via INTRAVENOUS

## 2020-12-28 MED ORDER — CLEVIDIPINE BUTYRATE 0.5 MG/ML IV EMUL: 0.0000 mg/h | INTRAVENOUS | Status: DC

## 2020-12-28 MED ORDER — ALTEPLASE (STROKE) FULL DOSE INFUSION
90.0000 mg | Freq: Once | INTRAVENOUS | Status: AC
  Administered 2020-12-28: 90 mg via INTRAVENOUS
  Filled 2020-12-28: qty 100

## 2020-12-28 MED ORDER — LIDOCAINE 5 % EX PTCH
1.0000 | MEDICATED_PATCH | CUTANEOUS | Status: DC
  Administered 2020-12-28 – 2021-01-01 (×5): 1 via TRANSDERMAL
  Filled 2020-12-28 (×6): qty 1

## 2020-12-28 MED ORDER — LABETALOL HCL 5 MG/ML IV SOLN: 20.0000 mg | Freq: Once | INTRAVENOUS | Status: DC

## 2020-12-28 MED ORDER — ACETAMINOPHEN 650 MG RE SUPP: 650.0000 mg | RECTAL | Status: DC | PRN

## 2020-12-28 MED ORDER — PANTOPRAZOLE SODIUM 40 MG IV SOLR
40.0000 mg | Freq: Every day | INTRAVENOUS | Status: DC
  Administered 2020-12-28 – 2020-12-29 (×2): 40 mg via INTRAVENOUS
  Filled 2020-12-28 (×3): qty 40

## 2020-12-28 MED ORDER — SODIUM CHLORIDE 0.9 % IV SOLN
50.0000 mL | Freq: Once | INTRAVENOUS | Status: AC
  Administered 2020-12-28: 50 mL via INTRAVENOUS

## 2020-12-28 MED ORDER — SODIUM CHLORIDE 0.9 % IV BOLUS
1000.0000 mL | Freq: Once | INTRAVENOUS | Status: AC
  Administered 2020-12-28: 1000 mL via INTRAVENOUS

## 2020-12-28 MED ORDER — ACETAMINOPHEN 160 MG/5ML PO SOLN: 650.0000 mg | ORAL | Status: DC | PRN

## 2020-12-28 MED ORDER — ACETAMINOPHEN 325 MG PO TABS
650.0000 mg | ORAL_TABLET | ORAL | Status: DC | PRN
  Administered 2020-12-31 – 2021-01-01 (×2): 650 mg via ORAL
  Filled 2020-12-28 (×2): qty 2

## 2020-12-28 MED ORDER — SENNOSIDES-DOCUSATE SODIUM 8.6-50 MG PO TABS: 1.0000 | ORAL_TABLET | Freq: Every evening | ORAL | Status: DC | PRN

## 2020-12-28 MED ORDER — DM-GUAIFENESIN ER 30-600 MG PO TB12
1.0000 | ORAL_TABLET | Freq: Two times a day (BID) | ORAL | Status: DC
  Administered 2020-12-28 – 2021-01-01 (×9): 1 via ORAL
  Filled 2020-12-28 (×10): qty 1

## 2020-12-28 MED ORDER — NICOTINE 14 MG/24HR TD PT24
14.0000 mg | MEDICATED_PATCH | Freq: Every day | TRANSDERMAL | Status: DC
  Administered 2020-12-28 – 2020-12-31 (×3): 14 mg via TRANSDERMAL
  Filled 2020-12-28 (×5): qty 1

## 2020-12-28 NOTE — Progress Notes (Signed)
Pharmacist Code Stroke Response  Notified to mix tPA at 1118 by Dr. Terrilee Files Delivered tPA to RN at 1124  tPA dose = 9mg  bolus over 1 minute followed by 81*mg for a total dose of 90mg  over 1 hour  Issues/delays encountered (if applicable): Patient discussing with family to make decision  Thank you for allowing pharmacy to be a part of this patient's care.  , PharmD, BCPS Clinical Pharmacist Clinical phone for 12/28/2020: [phone] 12/28/2020 11:26 AM   **Pharmacist phone directory can now be found on [email or web address] ([email or web address]).  Listed under [facility].

## 2020-12-28 NOTE — ED Notes (Signed)
Pt has a full set of updated vitals, unable to clear out red in the system

## 2020-12-28 NOTE — ED Triage Notes (Signed)
Pt BIB EMS from home due to CODE STROKE. LKN was 2am. Pt c/o left sided weakness, slurred speech. Pt arrives with deficits. Pt is alert and oriented on arrival. Pt reports hypertension for PMH but denies taking his medication as prescribed.

## 2020-12-28 NOTE — Progress Notes (Signed)
EEG complete - results pending 

## 2020-12-28 NOTE — Consult Note (Signed)
Medical Consultation   Aaron Gibson  EXB:[id number]  DOB: [birth year]  DOA: 12/28/2020  PCP: Esperanza Richters, PA-C   Outpatient Specialists: None   Requesting physician: Derry Lory - neurology  Reason for consultation: Stroke - given tPA.  He is now reporting some chest pain.  Chest pain, ?concerning for dissection.    History of Present Illness: Aaron Gibson is an 55 y.o. male with h/o HTN (diagnosed in 12/2018 but does not appear to have followed up); tobacco dependence; and morbid obesity presenting with code stroke.  He reports that he awoke this AM and had mild left-sided weakness.  As the morning progressed, he had more intensive left-sided weakness and difficulty ambulating.  When EMS arrived, he noticed R-sided chest pain with radiation to the back - or R-sided back pain with radiation to the chest; he couldn't tell which.  That pain has been present and unchanged since it started.  It is worse with movement, not necessarily associated with exertion.  It has not changed since he was given tPA.   Review of Systems:  ROS As per HPI otherwise 10 point review of systems negative.    Past Medical History: Past Medical History:  Diagnosis Date  . Hypertension    untreated  . Morbid obesity (HCC)     Past Surgical History: Past Surgical History:  Procedure Laterality Date  . UMBILICAL HERNIA REPAIR N/A 03/15/2015   Procedure: UMBILICAL HERNIA REPAIR WITH MESH;  Surgeon: Abigail Miyamoto, MD;  Location: [facility] OR;  Service: General;  Laterality: N/A;     Allergies:  No Known Allergies   Social History:  reports that he has been smoking cigarettes. He has a 14.50 pack-year smoking history. He has never used smokeless tobacco. He reports current alcohol use. He reports that he does not use drugs.   Family History: History reviewed. No pertinent family history.    Physical Exam: Vitals:   12/28/20 1805 12/28/20 1830 12/28/20 1900 12/28/20 1920  BP:  (!)  176/91 (!) 158/89 (!) 166/88  Pulse:  86 91 90  Resp:  18 (!) 23 18  Temp: 98.4 F (36.9 C)     TempSrc: Oral     SpO2:  97% 93% 100%  Weight:      Height:        Constitutional: Alert and awake, oriented x3, not in any acute distress. Eyes: PERLA, EOMI, irises appear normal, anicteric sclera ENMT: external ears and nose appear normal, normal hearing, Lips appear normal, oropharynx mucosa, tongue appear normal  Neck: neck appears normal, no masses, normal ROM CVS: S1-S2 clear, no murmur rubs or gallops, no LE edema, normal pedal pulses Chest wall: mild TTP along R chest wall, worse with muscle movement Back: muscle tightening along inferior scapular wall  Respiratory:  clear to auscultation bilaterally, no wheezing, rales or rhonchi. Respiratory effort normal. No accessory muscle use.  Abdomen: soft nontender, nondistended, normal bowel sounds, no hepatosplenomegaly, no hernias  Musculoskeletal: : no cyanosis, clubbing or edema noted bilaterally Neuro: persistent L facial droop, normal strength, sensation at the time of my exam although patient/sister report waxing and waning Psych: judgement and insight appear normal, stable mood and affect, mental status Skin: no rashes or lesions or ulcers, no induration or nodules    Data reviewed:  I have personally reviewed the recent labs and imaging studies  EKG at 1137: NSR with rate 88; mild ST elevation in V2-4 with  other nonspecific ST changes; no comparison available EKG at 1139: NSR with rate 93; LVH with nonspecific ST changes with no evidence of acute ischemia EKG at 1308: NSR with rate 86; mild ST elevation in V2-4 that appears mildly changed from prior EKG (more consistent with initial), with other nonspecific ST changes diffusely   Pertinent Labs:   Glucose 194 WBC 13.8 COVID/flu negative   Inpatient Medications:   Scheduled Meds: . labetalol  20 mg Intravenous Once  . pantoprazole (PROTONIX) IV  40 mg Intravenous QHS    Continuous Infusions: . sodium chloride 125 mL/hr at 12/28/20 1355  . clevidipine Stopped (12/28/20 1206)  . methocarbamol (ROBAXIN) IV       Radiological Exams on Admission: MR BRAIN WO CONTRAST  Result Date: 12/28/2020 CLINICAL DATA:  Stroke presentation.  Left-sided deficit. EXAM: MRI HEAD WITHOUT CONTRAST TECHNIQUE: Multiplanar, multiecho pulse sequences of the brain and surrounding structures were obtained without intravenous contrast. COMPARISON:  CT studies earlier same day FINDINGS: Brain: Diffusion imaging shows hyperacute infarction within the posterior putamen, posterior body of the caudate and radiating white matter tracts on the right. Early T2 signal abnormality. No mass effect or hemorrhage. The remainder the brain appears normal without evidence of prior insult. No hydrocephalus or extra-axial collection. Vascular: Major vessels at the base of the brain show flow. Skull and upper cervical spine: Negative Sinuses/Orbits: Mucosal inflammatory changes of the paranasal sinuses, which are small on a developmental basis. Orbits negative. Other: None IMPRESSION: 1. Acute infarction within the posterior putamen, posterior body of the caudate and radiating white matter tracts on the right. No hemorrhage or mass effect. The remainder the brain appears normal without evidence of prior insult. 2. Mucosal inflammatory changes of the paranasal sinuses, which are small on a developmental basis. Electronically Signed   By: Paulina Fusi M.D.   On: 12/28/2020 11:23   DG Chest Portable 1 View  Result Date: 12/28/2020 CLINICAL DATA:  Chest pain. EXAM: PORTABLE CHEST 1 VIEW COMPARISON:  Chest x-ray 09/05/2018. FINDINGS: Mediastinum and hilar structures normal. Heart size normal. Low lung volumes with mild bibasilar atelectasis. No pleural effusion or pneumothorax. IMPRESSION: Low lung volumes with mild bibasilar atelectasis. Electronically Signed   By: Maisie Fus  Register   On: 12/28/2020 13:46   CT HEAD  CODE STROKE WO CONTRAST  Result Date: 12/28/2020 CLINICAL DATA:  Code stroke. EXAM: CT HEAD WITHOUT CONTRAST TECHNIQUE: Contiguous axial images were obtained from the base of the skull through the vertex without intravenous contrast. COMPARISON:  None. FINDINGS: Brain: No evidence of acute infarction, hemorrhage, hydrocephalus, extra-axial collection or mass lesion/mass effect. Vascular: No hyperdense vessel. Skull: Normal. Negative for fracture or focal lesion. Sinuses/Orbits: Mucosal thickening throughout the paranasal sinuses with fluid level within the sphenoid and maxillary sinuses. The orbits are maintained. ASPECTS [facility] Stroke Program Early CT Score) - Ganglionic level infarction (caudate, lentiform nuclei, internal capsule, insula, M1-M3 cortex): 7 - Supraganglionic infarction (M4-M6 cortex): 3 Total score (0-10 with 10 being normal): 10 IMPRESSION: 1. No evidence of acute intracranial abnormality. 2. ASPECTS is 10. 3. Mucosal thickening throughout the paranasal sinuses with fluid level within the sphenoid and maxillary sinuses. Correlate for acute sinusitis. Results communicated to Dr. Derry Lory on 12/28/2020 at 10:40 a.m. via [facility] paging system. Electronically Signed   By: Baldemar Lenis M.D.   On: 12/28/2020 10:42   CT ANGIO HEAD CODE STROKE  Result Date: 12/28/2020 CLINICAL DATA:  Stroke.  Left-sided deficit. EXAM: CT ANGIOGRAPHY HEAD AND NECK TECHNIQUE:  Multidetector CT imaging of the head and neck was performed using the standard protocol during bolus administration of intravenous contrast. Multiplanar CT image reconstructions and MIPs were obtained to evaluate the vascular anatomy. Carotid stenosis measurements (when applicable) are obtained utilizing NASCET criteria, using the distal internal carotid diameter as the denominator. COMPARISON:  None. Head CT December 28, 2020. FINDINGS: CTA NECK FINDINGS Aortic arch: Common origin of the innominate and left common carotid artery  from the aortic arch. Imaged portion shows no evidence of aneurysm or dissection. No significant stenosis of the major arch vessel origins. Right carotid system: Minimal atherosclerotic changes in the right carotid bifurcation. No evidence of dissection, stenosis (50% or greater) or occlusion. Left carotid system: No evidence of dissection, stenosis (50% or greater) or occlusion. Vertebral arteries: The right vertebral artery is hypoplastic and ends in PICA. The dominant left vertebral artery has no evidence of dissection, stenosis or occlusion. Skeleton: Negative. Other neck: Negative Upper chest: Negative. Review of the MIP images confirms the above findings CTA HEAD FINDINGS Anterior circulation: Subtle luminal irregularity throughout the bilateral MCA and ACA vascular tree may represent mild intracranial atherosclerotic disease. No significant stenosis, proximal occlusion, aneurysm, or vascular malformation. Posterior circulation: The right vertebral artery ends in PICA. The left vertebral artery is dominant and has normal caliber. The basilar artery has small caliber throughout its entire course. Hypoplastic right P1/PCA with prominent right posterior communicating artery. Mild luminal irregularities in the bilateral PCAs may represent intracranial atherosclerotic disease without hemodynamically significant stenosis. Venous sinuses: As permitted by contrast timing, patent. Anatomic variants: Hypoplastic right P1 segment. IMPRESSION: 1. No large vessel occlusion or significant stenosis in the neck. 2. Subtle luminal irregularity throughout the bilateral MCA, ACA and PCA vascular tree may represent mild intracranial atherosclerotic disease without hemodynamically significant stenosis. Electronically Signed   By: Baldemar Lenis M.D.   On: 12/28/2020 11:04   CT ANGIO NECK CODE STROKE  Result Date: 12/28/2020 CLINICAL DATA:  Stroke.  Left-sided deficit. EXAM: CT ANGIOGRAPHY HEAD AND NECK TECHNIQUE:  Multidetector CT imaging of the head and neck was performed using the standard protocol during bolus administration of intravenous contrast. Multiplanar CT image reconstructions and MIPs were obtained to evaluate the vascular anatomy. Carotid stenosis measurements (when applicable) are obtained utilizing NASCET criteria, using the distal internal carotid diameter as the denominator. COMPARISON:  None. Head CT December 28, 2020. FINDINGS: CTA NECK FINDINGS Aortic arch: Common origin of the innominate and left common carotid artery from the aortic arch. Imaged portion shows no evidence of aneurysm or dissection. No significant stenosis of the major arch vessel origins. Right carotid system: Minimal atherosclerotic changes in the right carotid bifurcation. No evidence of dissection, stenosis (50% or greater) or occlusion. Left carotid system: No evidence of dissection, stenosis (50% or greater) or occlusion. Vertebral arteries: The right vertebral artery is hypoplastic and ends in PICA. The dominant left vertebral artery has no evidence of dissection, stenosis or occlusion. Skeleton: Negative. Other neck: Negative Upper chest: Negative. Review of the MIP images confirms the above findings CTA HEAD FINDINGS Anterior circulation: Subtle luminal irregularity throughout the bilateral MCA and ACA vascular tree may represent mild intracranial atherosclerotic disease. No significant stenosis, proximal occlusion, aneurysm, or vascular malformation. Posterior circulation: The right vertebral artery ends in PICA. The left vertebral artery is dominant and has normal caliber. The basilar artery has small caliber throughout its entire course. Hypoplastic right P1/PCA with prominent right posterior communicating artery. Mild luminal irregularities in the bilateral  PCAs may represent intracranial atherosclerotic disease without hemodynamically significant stenosis. Venous sinuses: As permitted by contrast timing, patent. Anatomic  variants: Hypoplastic right P1 segment. IMPRESSION: 1. No large vessel occlusion or significant stenosis in the neck. 2. Subtle luminal irregularity throughout the bilateral MCA, ACA and PCA vascular tree may represent mild intracranial atherosclerotic disease without hemodynamically significant stenosis. Electronically Signed   By: Pedro Earls M.D.   On: 12/28/2020 11:04    Impression/Recommendations Principal Problem:   Stroke (cerebrum) (HCC) Active Problems:   Morbid obesity ([place])   Hypertension   Chest pain, musculoskeletal  Acute CVA -Admitted to Neuro ICU by neuro team after he presented with an acute CVA -He has received tPA -Further management by neuro team at this time regarding CVA and HTN -Of note, he was previously diagnosed with HTN in Jan 2020 and started on medication but he took it for less than a month   Obesity -Body mass index is 40.47 kg/m..  -Weight loss should be encouraged -Outpatient PCP/bariatric medicine/bariatric surgery f/u encouraged   Chest pain -Pain was present since EMS arrival and unchanged in that time -Negative troponin x 3 -History is not c/w dissection from tPA -Also does not appear to be ACS -Negative CXR -Exam very c/w MSK-related pain -EKG reviewed with cardiology - appears to have lead reversal but no concern for ACS based on this EKG in conjunction with negative troponins -Of note, given his CVA he is at increased for ASCVD additionally and so may benefit from an evaluation at some point in the future -The patient was given Robaxin and will follow -Will also order lidocaine patch for R inferior scapula, which appears to be the etiology of the pain -If improved tomorrow, TRH will likely sign off  Tobacco dependence -Encourage cessation.   -This was discussed with the patient and should be reviewed on an ongoing basis.   -Patch ordered at patient request.    Note: This patient has been tested and is negative for the  novel coronavirus COVID-19.    Thank you for this consultation.  [facility] hospitalist team will follow the patient with you.   Time Spent: 50 minutes  Karmen Bongo M.D. [facility] 12/28/2020, 7:40 PM

## 2020-12-28 NOTE — ED Notes (Signed)
Paged [facility] for RN 

## 2020-12-28 NOTE — ED Notes (Signed)
Neurologist at bedside. 

## 2020-12-28 NOTE — Progress Notes (Signed)
Brief neuro update:  1. Patient is having waxing and waning weakness in left upper extremity and left leg after TPA. I ordered fluid bolus and started him on 125 mL an hour of fluid and will keep head of bed flat for the next 24 hours with strict bedrest. I will also get a routine EEG to assess for any epileptogenic abnormalities.  2. Patient reports some midsternal chest pain going into his back. Troponins x2, CXR, EKG obtained. EKG with LVH. First troponin is negative. We will get hospitalist team to help with chest pain work-up.

## 2020-12-28 NOTE — ED Notes (Signed)
Neurologist paged regarding change in neuro status.

## 2020-12-28 NOTE — Code Documentation (Signed)
Patient from home where he was LKW at 0200 until he got out of bed and fell due to left sided weakness. Pt thought his leg had just fallen asleep. When he woke up again he had slurred speech and couldn't move the left side of his body. He called EMS and they had to break the door down to get inside. EMS called the code stroke and took pt to [facility]. He was met by the Stroke team and EDP. Pt NIHSS 10 initially for slurred speech, facial droop and left sided paralysis. Pt taken for CT and CTA. Both were negative for bleed/LVO according to MD. Pt taken for MRI to confirm stroke and get a better timeframe for when it occurred. Due to results pt was qualified to get TPA. Pharmacist at bedside mixed TPA. Delay in start time r/t patient decision making. He wanted [place] to call family to help him decide. TPA started at 1123. BP 179/101at start time. Care Plan: q15x2 hours, q30x6, and q1x16 with vitals and neuro checks/mNIHSS. ICU made aware of bed need. Hand off with Jonelle Sidle.

## 2020-12-28 NOTE — H&P (Signed)
Neurology Code stroke H&P  CC: CODE STROKE  History is obtained from: patient, EMS  HPI: Kentrel Clevenger is a 55 y.o. male with a PMHx of obesity and HTN per the chart. Last visit to PCP was a year ago and per note, he was placed on Losartan. No further notes, so ? f/up or compliance. Also, found hx of tobacco abuse and there is documentation of being placed on Wellbutrin in past by PCP. Also, ? ADD but pt apparently did not go for psych testing.    Pt presented today to the ED per EMS as a code stroke. Pt states he went to sleep on the couch at 0200 and awakened with slurred speech and left sided paralysis. Apparently, he had fallen off the couch and EMS had to breech home.   LKW: 0200 tpa given yes, delay in tPA was due to patient needing MRI as this was a wake up stroke. decision to offer tPA made at 1115 but there was a delay of 5 additoinal minutes due to patient wanting to speak to family members first, but after explanation by MD about time sensitivity, pt consented. IR Thrombectomy No, no LVO.   Modified Rankin Score: 0 prior to admission.  NIHSS:  1a Level of Conscious.: 0 1b LOC Questions: 0 1c LOC Commands: 0 2 Best Gaze: 0 3 Visual: 0 4 Facial Palsy: 1 5a Motor Arm - left: 4 5b Motor Arm - Right: 0 6a Motor Leg - Left: 4 6b Motor Leg - Right: 0 7 Limb Ataxia: 0 8 Sensory: 0 9 Best Language: 0 10 Dysarthria: 1 11 Extinct. and Inatten.: 0 TOTAL: 10   ROS: No HA, n/v. Unable to perform complete ROS due to emergency situation.   PMHx: HTN, obesity, ? ADD (didn't have psych testing), no previous hx of stroke  No family history on file.  Social History:  reports that he has been smoking cigarettes. He has a 14.50 pack-year smoking history. He has never used smokeless tobacco. He reports current alcohol use. He reports that he does not use drugs.   Prior to Admission medications   Medication Sig Start Date End Date Taking? Authorizing Provider  buPROPion (WELLBUTRIN  XL) 300 MG 24 hr tablet Take 1 tablet (300 mg total) by mouth daily. 01/14/19   Saguier, Percell Miller, PA-C  losartan (COZAAR) 50 MG tablet Take 1 tablet (50 mg total) by mouth daily. 01/14/19   Saguier, Percell Miller, PA-C    Exam: Current vital signs: BP 220/110 down to 170/90  Physical Exam  Constitutional: Appears well-developed and well-nourished. Obese. Psych: Affect appropriate to situation Eyes: No scleral injection HENT: No OP obstrucion Head: Normocephalic.  Cardiovascular: RRR Respiratory: Effort normal. SaO2 95% GI: Soft.  Skin: WDI  Neuro: Mental Status: Patient is awake, alert, oriented to person, place, month, year, and situation. Patient is able to give a clear and coherent history. No signs of aphasia or neglect. Speech/Language: Speech is intact and fluent. Dysarthria noted but no aphasia.  Comprehension, repetition, and naming intact.  Cranial Nerves: II: Visual Fields are full. Pupils are equal, round, and reactive to light. III,IV, VI: EOMI without ptosis or diploplia.  V: Facial sensation is symmetric to light touch. Able to move jaw back and forth.  VII: Facial droop noted.2 Able to puff cheeks and raise eyebrows.  VIII: hearing is intact to voice IX, X: Uvula elevates symmetrically. Phonation normal.  XI: Shoulder shrug is symmetric. XII: tongue is midline without atrophy or fasciculations.  Motor: Tone  is normal on the right, flaccid on left. Bulk is normal. 5/5 strength on RUE and RLE. 0/5 LUE/LLE.  Sensory: Sensation is symmetric to light touch in the arms and legs. Extinction intact.  Deep Tendon Reflexes: 2+ biceps. 1+ right patella. 0 left patella. Plantars: Toes are downgoing bilaterally. Cerebellar: FNF and HKS are intact on the right. Can not assess on left due to hemiplegia. No drift R UE/LE. Can not lift LUE/LLE.   Gait: deferred.   I have reviewed labs in epic and the pertinent results are:  INR  1.0    CBG  194    Creat 1.00  I have reviewed the  images obtained: Dr Ezzie Dural reviewed images independently and with radiologist.   CT head showed  1. No evidence of acute intracranial abnormality. 2. ASPECTS is 10. 3. Mucosal thickening throughout the paranasal sinuses with fluid level within the sphenoid and maxillary sinuses. Correlate for acute sinusitis.  MRI brain showed  1. Acute infarction within the posterior putamen, posterior body of the caudate and radiating white matter tracts on the right. No hemorrhage or mass effect. The remainder the brain appears normal without evidence of prior insult. 2. Mucosal inflammatory changes of the paranasal sinuses, which are small on a developmental basis. mismatch in DWI imaging.   CTA NECK FINDINGS Aortic arch: Common origin of the innominate and left common carotid artery from the aortic arch. Imaged portion shows no evidence of aneurysm or dissection. No significant stenosis of the major arch vessel origins. Right carotid system: Minimal atherosclerotic changes in the right carotid bifurcation. No evidence of dissection, stenosis (50% or greater) or occlusion. Left carotid system: No evidence of dissection, stenosis (50% or greater) or occlusion. Vertebral arteries: The right vertebral artery is hypoplastic and ends in PICA. The dominant left vertebral artery has no evidence of dissection, stenosis or occlusion. Skeleton: Negative. Other neck: Negative Upper chest: Negative. Review of the MIP images confirms the above findings  CTA HEAD FINDINGS Anterior circulation: Subtle luminal irregularity throughout the bilateral MCA and ACA vascular tree may represent mild intracranial atherosclerotic disease. No significant stenosis, proximal occlusion, aneurysm, or vascular malformation. Posterior circulation: The right vertebral artery ends in PICA. The left vertebral artery is dominant and has normal caliber. The basilar artery has small caliber throughout its entire  course. Hypoplastic right P1/PCA with prominent right posterior communicating artery. Mild luminal irregularities in the bilateral PCAs may represent intracranial atherosclerotic disease without hemodynamically significant stenosis. Venous sinuses: As permitted by contrast timing, patent. Anatomic variants: Hypoplastic right P1 segment.  IMPRESSION: 1. No large vessel occlusion or significant stenosis in the neck. 2. Subtle luminal irregularity throughout the bilateral MCA, ACA and PCA vascular tree may represent mild intracranial atherosclerotic disease without hemodynamically significant stenosis.   Assessment: Keylan Costabile is a 55 y.o. male PMHx of HTN and tobacco abuse. Presented with left hemiplegia and dysarthria which is noticed upon awakening this a.m. BP 220/110 on arrival and bleed was suspected. NIHHS 10. CT head neg for acute issue. CTA H/N without LVO. MRI brain showed acute infarct and tPA was given mismatch between DWI and T2/FLAIR imaging.  Impression: R posterior putamen stroke  Plan: -ICU admission by neuro. F/up with stroke team tomorrow.  - Recommend TTE. - Recommend labs: HbA1c-serum glucose high, lipid panel, TSH. - Recommend Statin if LDL > 70 - tPA was given, so no anti platelet for now.  - SBP goal is less than 180/105. Cleviprex ordered. Pt received Labetalol as well x  1 dose.   - Telemetry monitoring for arrhythmia. - Recommend bedside Swallow screen. - Recommend Stroke education. - Recommend PT/OT/SLP consult. - Recommend metabolic/infectious workup with CBC, CMP, UA with UCx, CXR, CK, serum lactate. - low intensity monitoring was considered but given his intermittently elevated blood pressure, we opted against it.   This patient is critically ill and at significant risk of neurological worsening, death and care requires constant monitoring of vital signs, hemodynamics,respiratory and cardiac monitoring, neurological assessment, discussion with  family, other specialists and medical decision making of high complexity. I spent 60 minutes of neurocritical care time  in the care of  this patient. This was time spent independent of any time provided by nurse practitioner or PA.   Pt seen by Jimmye Norman, NP and MD in code stroke. Note/plan to be edited by MD as necessary.  Pager: [phone]

## 2020-12-28 NOTE — ED Provider Notes (Signed)
[facility] EMERGENCY DEPARTMENT Provider Note   CSN: [id number] Arrival date & time: 12/28/20  1020     History No chief complaint on file.   Aaron Gibson is a 55 y.o. male.  Pt presents to the ED today as a code stroke.  Pt was met at the ambulance bay by the stroke team and myself.  Pt went to sleep on the couch at 0200.  He woke up this morning and could not feel his left side.  He thought he slept on it wrong and tried to get up.  When he got up, he fell to the ground because his left leg was not moving.  He was able to get to his phone and EMS had to break into the door to get him.  He has slurred speech, left facial droop, and left arm and leg paralysis.  He has a hx of htn, but has not taken the meds in about 1 year.  No blood thinners.        No past medical history on file.  There are no problems to display for this patient.   Past Surgical History:  Procedure Laterality Date  . UMBILICAL HERNIA REPAIR N/A 03/15/2015   Procedure: UMBILICAL HERNIA REPAIR WITH MESH;  Surgeon: Coralie Keens, MD;  Location: [facility];  Service: General;  Laterality: N/A;       No family history on file.  Social History   Tobacco Use  . Smoking status: Current Every Day Smoker    Packs/day: 0.50    Years: 29.00    Pack years: 14.50    Types: Cigarettes  . Smokeless tobacco: Never Used  Substance Use Topics  . Alcohol use: Yes    Comment: occasionally holidays  . Drug use: No    Home Medications Prior to Admission medications   Medication Sig Start Date End Date Taking? Authorizing Provider  buPROPion (WELLBUTRIN XL) 300 MG 24 hr tablet Take 1 tablet (300 mg total) by mouth daily. 01/14/19   Saguier, Percell Miller, PA-C  losartan (COZAAR) 50 MG tablet Take 1 tablet (50 mg total) by mouth daily. 01/14/19   Saguier, Percell Miller, PA-C    Allergies    Patient has no known allergies.  Review of Systems   Review of Systems  Neurological: Positive for speech difficulty  and weakness.  All other systems reviewed and are negative.   Physical Exam Updated Vital Signs BP (!) 163/97   Physical Exam Vitals and nursing note reviewed.  HENT:     Head: Normocephalic and atraumatic.     Right Ear: External ear normal.     Left Ear: External ear normal.     Nose: Nose normal.     Mouth/Throat:     Mouth: Mucous membranes are moist.     Pharynx: Oropharynx is clear.  Eyes:     Extraocular Movements: Extraocular movements intact.     Conjunctiva/sclera: Conjunctivae normal.     Pupils: Pupils are equal, round, and reactive to light.  Cardiovascular:     Rate and Rhythm: Normal rate and regular rhythm.     Pulses: Normal pulses.     Heart sounds: Normal heart sounds.  Pulmonary:     Effort: Pulmonary effort is normal.     Breath sounds: Normal breath sounds.  Abdominal:     General: Abdomen is flat. Bowel sounds are normal.     Palpations: Abdomen is soft.  Musculoskeletal:        General: Normal range  of motion.     Cervical back: Normal range of motion and neck supple.  Skin:    General: Skin is warm.     Capillary Refill: Capillary refill takes less than 2 seconds.  Neurological:     Mental Status: He is alert and oriented to person, place, and time.     Comments: Visual fields intact.  No left sided neglect.  Hemiparesis to left side.  Left facial droop.  Psychiatric:        Mood and Affect: Mood normal.        Behavior: Behavior normal.     ED Results / Procedures / Treatments   Labs (all labs ordered are listed, but only abnormal results are displayed) Labs Reviewed  CBC - Abnormal; Notable for the following components:      Result Value   WBC 13.8 (*)    All other components within normal limits  DIFFERENTIAL - Abnormal; Notable for the following components:   Neutro Abs 8.1 (*)    Lymphs Abs 4.4 (*)    All other components within normal limits  COMPREHENSIVE METABOLIC PANEL - Abnormal; Notable for the following components:    Glucose, Bld 194 (*)    Calcium 8.8 (*)    Albumin 3.3 (*)    All other components within normal limits  I-STAT CHEM 8, ED - Abnormal; Notable for the following components:   Glucose, Bld 191 (*)    Calcium, Ion 1.12 (*)    All other components within normal limits  CBG MONITORING, ED - Abnormal; Notable for the following components:   Glucose-Capillary 194 (*)    All other components within normal limits  RESP PANEL BY RT-PCR (FLU A&B, COVID) ARPGX2  PROTIME-INR  APTT    EKG None  Radiology MR BRAIN WO CONTRAST  Result Date: 12/28/2020 CLINICAL DATA:  Stroke presentation.  Left-sided deficit. EXAM: MRI HEAD WITHOUT CONTRAST TECHNIQUE: Multiplanar, multiecho pulse sequences of the brain and surrounding structures were obtained without intravenous contrast. COMPARISON:  CT studies earlier same day FINDINGS: Brain: Diffusion imaging shows hyperacute infarction within the posterior putamen, posterior body of the caudate and radiating white matter tracts on the right. Early T2 signal abnormality. No mass effect or hemorrhage. The remainder the brain appears normal without evidence of prior insult. No hydrocephalus or extra-axial collection. Vascular: Major vessels at the base of the brain show flow. Skull and upper cervical spine: Negative Sinuses/Orbits: Mucosal inflammatory changes of the paranasal sinuses, which are small on a developmental basis. Orbits negative. Other: None IMPRESSION: 1. Acute infarction within the posterior putamen, posterior body of the caudate and radiating white matter tracts on the right. No hemorrhage or mass effect. The remainder the brain appears normal without evidence of prior insult. 2. Mucosal inflammatory changes of the paranasal sinuses, which are small on a developmental basis. Electronically Signed   By: Nelson Chimes M.D.   On: 12/28/2020 11:23   CT HEAD CODE STROKE WO CONTRAST  Result Date: 12/28/2020 CLINICAL DATA:  Code stroke. EXAM: CT HEAD WITHOUT  CONTRAST TECHNIQUE: Contiguous axial images were obtained from the base of the skull through the vertex without intravenous contrast. COMPARISON:  None. FINDINGS: Brain: No evidence of acute infarction, hemorrhage, hydrocephalus, extra-axial collection or mass lesion/mass effect. Vascular: No hyperdense vessel. Skull: Normal. Negative for fracture or focal lesion. Sinuses/Orbits: Mucosal thickening throughout the paranasal sinuses with fluid level within the sphenoid and maxillary sinuses. The orbits are maintained. ASPECTS [facility] Stroke Program Early CT Score) -  Ganglionic level infarction (caudate, lentiform nuclei, internal capsule, insula, M1-M3 cortex): 7 - Supraganglionic infarction (M4-M6 cortex): 3 Total score (0-10 with 10 being normal): 10 IMPRESSION: 1. No evidence of acute intracranial abnormality. 2. ASPECTS is 10. 3. Mucosal thickening throughout the paranasal sinuses with fluid level within the sphenoid and maxillary sinuses. Correlate for acute sinusitis. Results communicated to Dr. Lorrin Goodell on 12/28/2020 at 10:40 a.m. via [facility] paging system. Electronically Signed   By: Pedro Earls M.D.   On: 12/28/2020 10:42   CT ANGIO HEAD CODE STROKE  Result Date: 12/28/2020 CLINICAL DATA:  Stroke.  Left-sided deficit. EXAM: CT ANGIOGRAPHY HEAD AND NECK TECHNIQUE: Multidetector CT imaging of the head and neck was performed using the standard protocol during bolus administration of intravenous contrast. Multiplanar CT image reconstructions and MIPs were obtained to evaluate the vascular anatomy. Carotid stenosis measurements (when applicable) are obtained utilizing NASCET criteria, using the distal internal carotid diameter as the denominator. COMPARISON:  None. Head CT December 28, 2020. FINDINGS: CTA NECK FINDINGS Aortic arch: Common origin of the innominate and left common carotid artery from the aortic arch. Imaged portion shows no evidence of aneurysm or dissection. No significant  stenosis of the major arch vessel origins. Right carotid system: Minimal atherosclerotic changes in the right carotid bifurcation. No evidence of dissection, stenosis (50% or greater) or occlusion. Left carotid system: No evidence of dissection, stenosis (50% or greater) or occlusion. Vertebral arteries: The right vertebral artery is hypoplastic and ends in PICA. The dominant left vertebral artery has no evidence of dissection, stenosis or occlusion. Skeleton: Negative. Other neck: Negative Upper chest: Negative. Review of the MIP images confirms the above findings CTA HEAD FINDINGS Anterior circulation: Subtle luminal irregularity throughout the bilateral MCA and ACA vascular tree may represent mild intracranial atherosclerotic disease. No significant stenosis, proximal occlusion, aneurysm, or vascular malformation. Posterior circulation: The right vertebral artery ends in PICA. The left vertebral artery is dominant and has normal caliber. The basilar artery has small caliber throughout its entire course. Hypoplastic right P1/PCA with prominent right posterior communicating artery. Mild luminal irregularities in the bilateral PCAs may represent intracranial atherosclerotic disease without hemodynamically significant stenosis. Venous sinuses: As permitted by contrast timing, patent. Anatomic variants: Hypoplastic right P1 segment. IMPRESSION: 1. No large vessel occlusion or significant stenosis in the neck. 2. Subtle luminal irregularity throughout the bilateral MCA, ACA and PCA vascular tree may represent mild intracranial atherosclerotic disease without hemodynamically significant stenosis. Electronically Signed   By: Pedro Earls M.D.   On: 12/28/2020 11:04   CT ANGIO NECK CODE STROKE  Result Date: 12/28/2020 CLINICAL DATA:  Stroke.  Left-sided deficit. EXAM: CT ANGIOGRAPHY HEAD AND NECK TECHNIQUE: Multidetector CT imaging of the head and neck was performed using the standard protocol during  bolus administration of intravenous contrast. Multiplanar CT image reconstructions and MIPs were obtained to evaluate the vascular anatomy. Carotid stenosis measurements (when applicable) are obtained utilizing NASCET criteria, using the distal internal carotid diameter as the denominator. COMPARISON:  None. Head CT December 28, 2020. FINDINGS: CTA NECK FINDINGS Aortic arch: Common origin of the innominate and left common carotid artery from the aortic arch. Imaged portion shows no evidence of aneurysm or dissection. No significant stenosis of the major arch vessel origins. Right carotid system: Minimal atherosclerotic changes in the right carotid bifurcation. No evidence of dissection, stenosis (50% or greater) or occlusion. Left carotid system: No evidence of dissection, stenosis (50% or greater) or occlusion. Vertebral arteries: The right  vertebral artery is hypoplastic and ends in PICA. The dominant left vertebral artery has no evidence of dissection, stenosis or occlusion. Skeleton: Negative. Other neck: Negative Upper chest: Negative. Review of the MIP images confirms the above findings CTA HEAD FINDINGS Anterior circulation: Subtle luminal irregularity throughout the bilateral MCA and ACA vascular tree may represent mild intracranial atherosclerotic disease. No significant stenosis, proximal occlusion, aneurysm, or vascular malformation. Posterior circulation: The right vertebral artery ends in PICA. The left vertebral artery is dominant and has normal caliber. The basilar artery has small caliber throughout its entire course. Hypoplastic right P1/PCA with prominent right posterior communicating artery. Mild luminal irregularities in the bilateral PCAs may represent intracranial atherosclerotic disease without hemodynamically significant stenosis. Venous sinuses: As permitted by contrast timing, patent. Anatomic variants: Hypoplastic right P1 segment. IMPRESSION: 1. No large vessel occlusion or significant  stenosis in the neck. 2. Subtle luminal irregularity throughout the bilateral MCA, ACA and PCA vascular tree may represent mild intracranial atherosclerotic disease without hemodynamically significant stenosis. Electronically Signed   By: Pedro Earls M.D.   On: 12/28/2020 11:04    Procedures Procedures (including critical care time)  Medications Ordered in ED Medications  sodium chloride flush (NS) 0.9 % injection 3 mL (has no administration in time range)  alteplase (ACTIVASE) 1 mg/mL infusion 90 mg (90 mg Intravenous New Bag/Given 12/28/20 1123)    Followed by  0.9 %  sodium chloride infusion (has no administration in time range)  iohexol (OMNIPAQUE) 350 MG/ML injection 60 mL (60 mLs Intravenous Contrast Given 12/28/20 1105)    ED Course  I have reviewed the triage vital signs and the nursing notes.  Pertinent labs & imaging results that were available during my care of the patient were reviewed by me and considered in my medical decision making (see chart for details).    MDM Rules/Calculators/A&P                          CT head did not show an ICH or anything acute.  CTA head/neck done.   Pt taken emergently to MRI.  MRI shows a stroke.  BP is low enough now for pt to receive tpa.  Dr. Lorrin Goodell will admit.  CRITICAL CARE Performed by: Isla Pence   Total critical care time: 30 minutes  Critical care time was exclusive of separately billable procedures and treating other patients.  Critical care was necessary to treat or prevent imminent or life-threatening deterioration.  Critical care was time spent personally by me on the following activities: development of treatment plan with patient and/or surrogate as well as nursing, discussions with consultants, evaluation of patient's response to treatment, examination of patient, obtaining history from patient or surrogate, ordering and performing treatments and interventions, ordering and review of laboratory  studies, ordering and review of radiographic studies, pulse oximetry and re-evaluation of patient's condition.  Final Clinical Impression(s) / ED Diagnoses Final diagnoses:  Cerebrovascular accident (CVA), unspecified mechanism ([place])    Rx / [facility] Orders ED Discharge Orders    None       Isla Pence, MD 12/28/20 1136

## 2020-12-29 ENCOUNTER — Other Ambulatory Visit (HOSPITAL_COMMUNITY): Payer: Self-pay

## 2020-12-29 ENCOUNTER — Inpatient Hospital Stay (HOSPITAL_COMMUNITY): Payer: Medicaid Other

## 2020-12-29 DIAGNOSIS — I6389 Other cerebral infarction: Secondary | ICD-10-CM | POA: Diagnosis not present

## 2020-12-29 DIAGNOSIS — I361 Nonrheumatic tricuspid (valve) insufficiency: Secondary | ICD-10-CM

## 2020-12-29 DIAGNOSIS — I6300 Cerebral infarction due to thrombosis of unspecified precerebral artery: Secondary | ICD-10-CM

## 2020-12-29 DIAGNOSIS — I340 Nonrheumatic mitral (valve) insufficiency: Secondary | ICD-10-CM

## 2020-12-29 DIAGNOSIS — J3489 Other specified disorders of nose and nasal sinuses: Secondary | ICD-10-CM | POA: Diagnosis not present

## 2020-12-29 DIAGNOSIS — I639 Cerebral infarction, unspecified: Secondary | ICD-10-CM

## 2020-12-29 DIAGNOSIS — J0110 Acute frontal sinusitis, unspecified: Secondary | ICD-10-CM | POA: Diagnosis not present

## 2020-12-29 LAB — HEMOGLOBIN A1C
Hgb A1c MFr Bld: 6.5 % — ABNORMAL HIGH (ref 4.8–5.6)
Mean Plasma Glucose: 139.85 mg/dL

## 2020-12-29 LAB — LIPID PANEL
Cholesterol: 192 mg/dL (ref 0–200)
HDL: 38 mg/dL — ABNORMAL LOW (ref >40)
LDL Cholesterol: 138 mg/dL — ABNORMAL HIGH (ref 0–99)
Total CHOL/HDL Ratio: 5.1 RATIO
Triglycerides: 78 mg/dL (ref <150)
VLDL: 16 mg/dL (ref 0–40)

## 2020-12-29 LAB — ECHOCARDIOGRAM COMPLETE BUBBLE STUDY
Area-P 1/2: 5.13 cm2
S' Lateral: 2.5 cm

## 2020-12-29 IMAGING — CT CT HEAD W/O CM
4 series · 16 of 47 positions shown, 18 images · non-contrast
Comparison: the previous day's study

CLINICAL DATA: Stroke, 24 hours post tPA, follow-up

EXAM:
CT HEAD WITHOUT CONTRAST
TECHNIQUE: Contiguous axial images were obtained from the base of the skull
through the vertex without intravenous contrast.

[Series 3: head wo · axial · 0.43mm/px · z∈[+1126,+1252]mm · 7 of 35 slices shown, 9 images]
[im 5/35  brain]
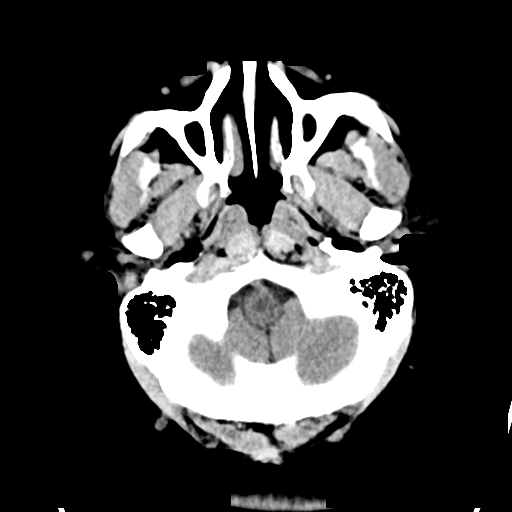
[im 5/35  bone]
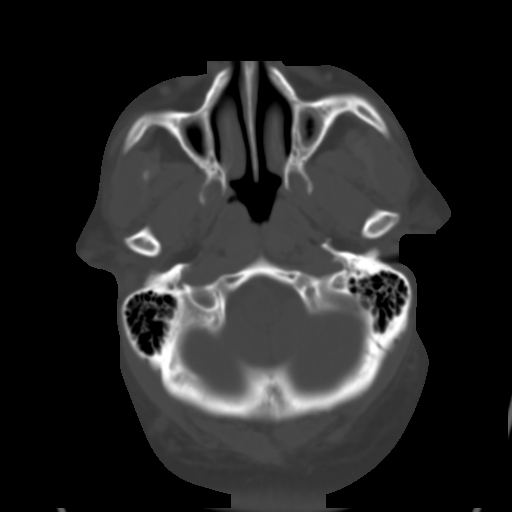
[im 9/35  brain]
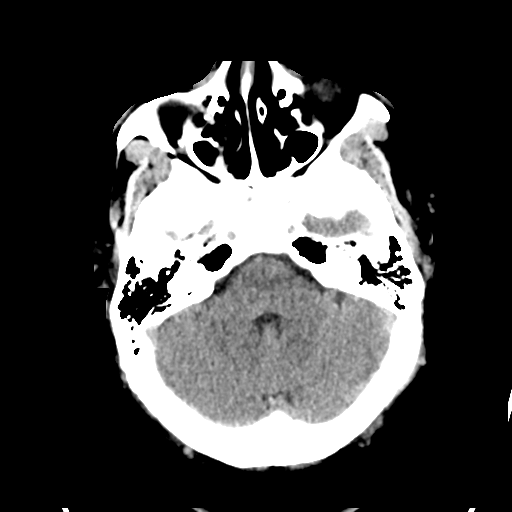
[im 13/35  brain]
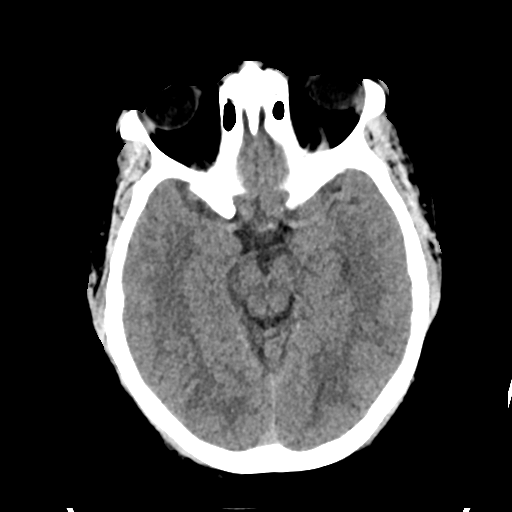
[im 18/35  brain]
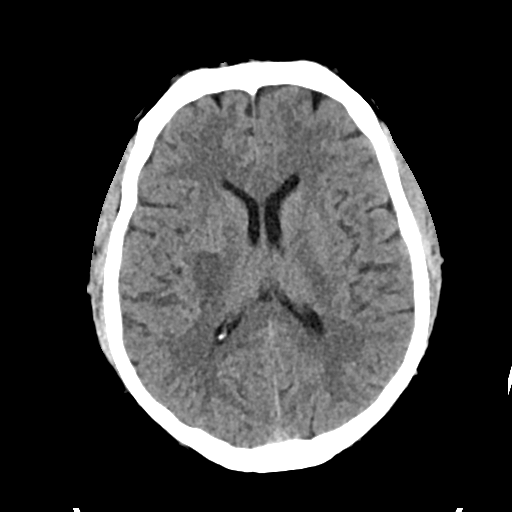
[im 22/35  brain]
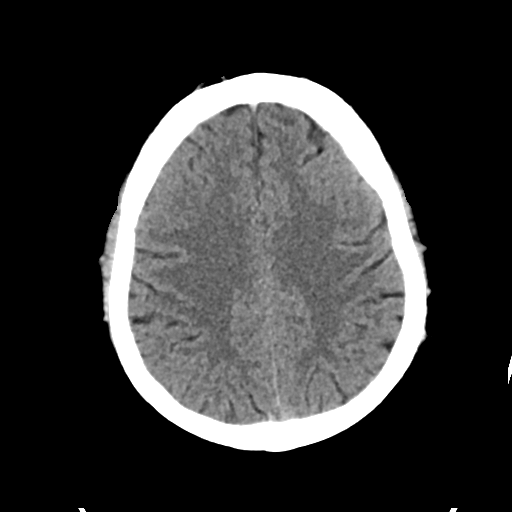
[im 22/35  bone]
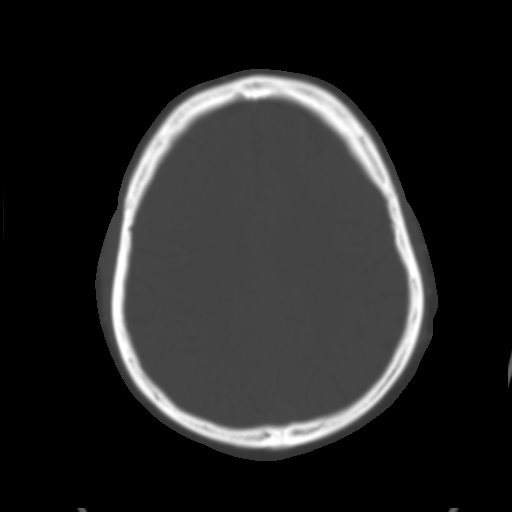
[im 26/35  brain]
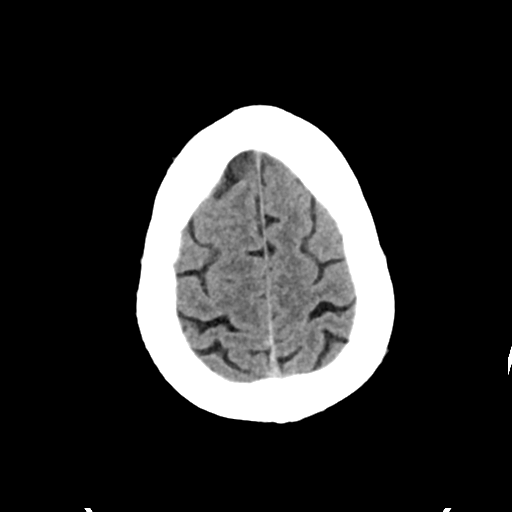
[im 30/35  brain]
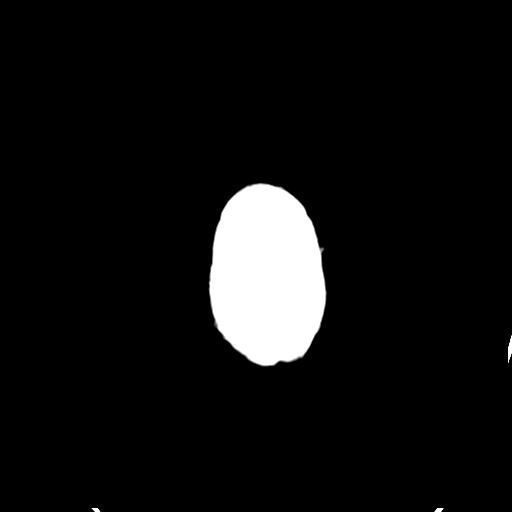

[Series 4: head bone · axial · 0.43mm/px · z∈[+1122,+1156]mm · 3 of 86 slices shown]
[im 9/86  bone]
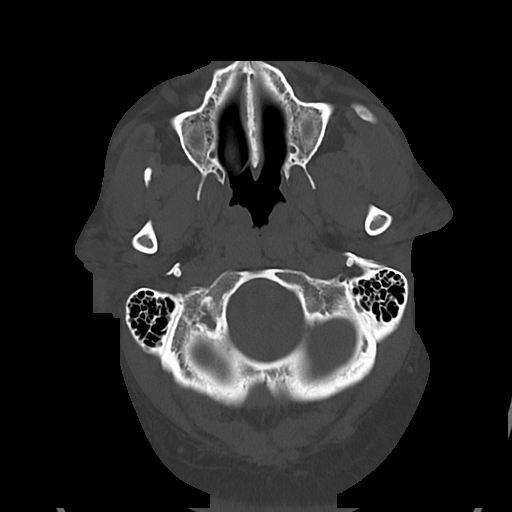
[im 18/86  bone]
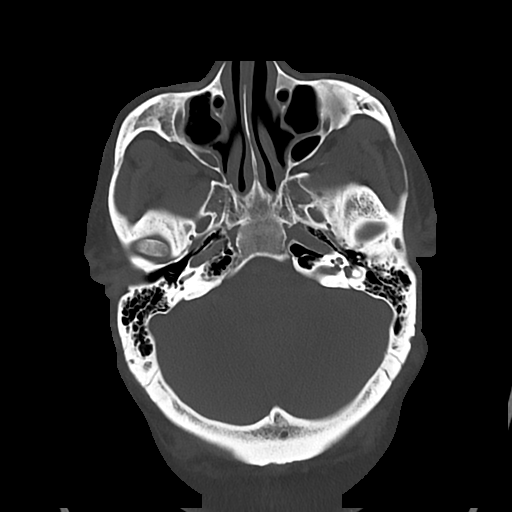
[im 26/86  bone]
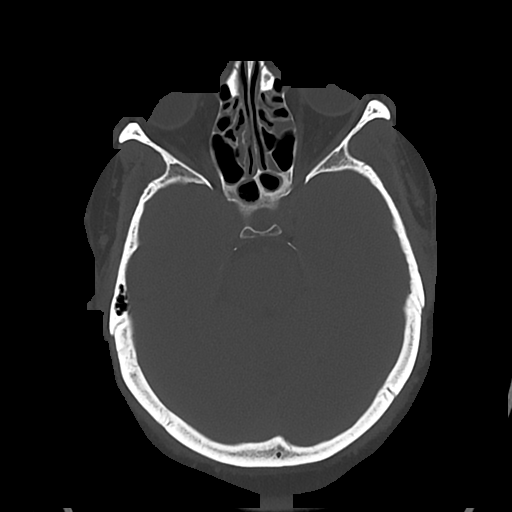

[Series 5: cor soft · coronal · 0.34mm/px · 3 of 69 slices shown]
[im 23/69  brain]
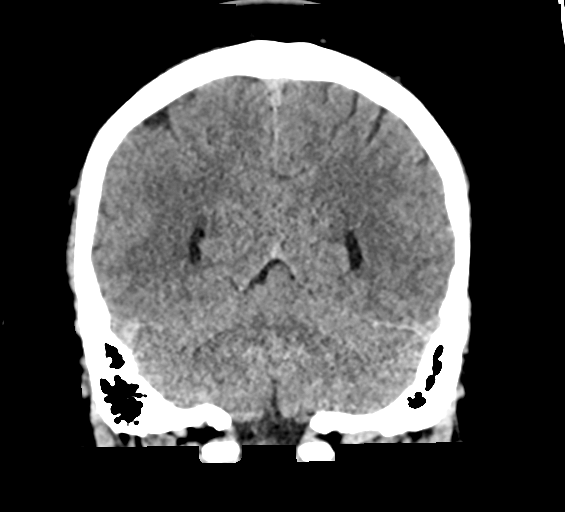
[im 31/69  brain]
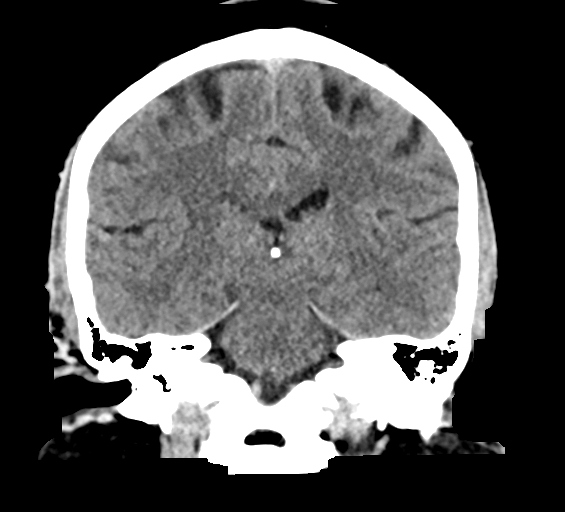
[im 38/69  brain]
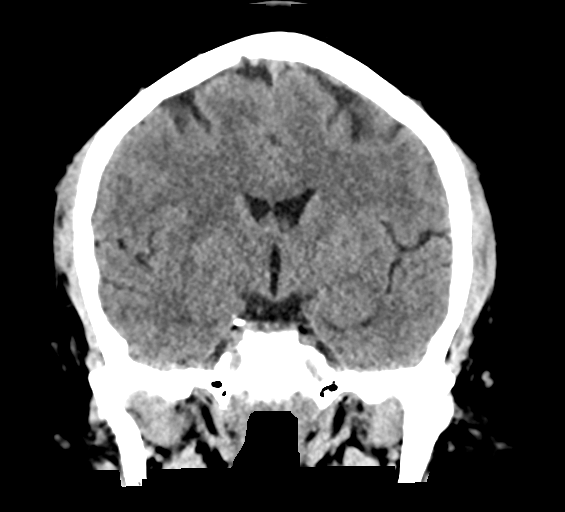

[Series 6: sag soft · sagittal · 0.32mm/px · 3 of 64 slices shown]
[im 22/64  brain]
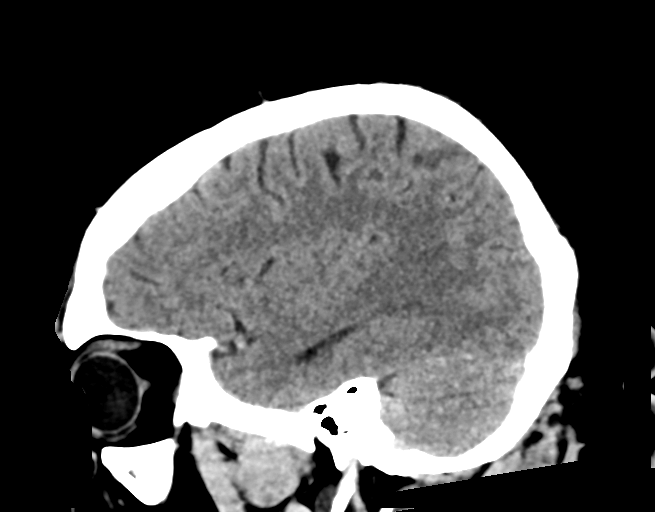
[im 32/64  brain]
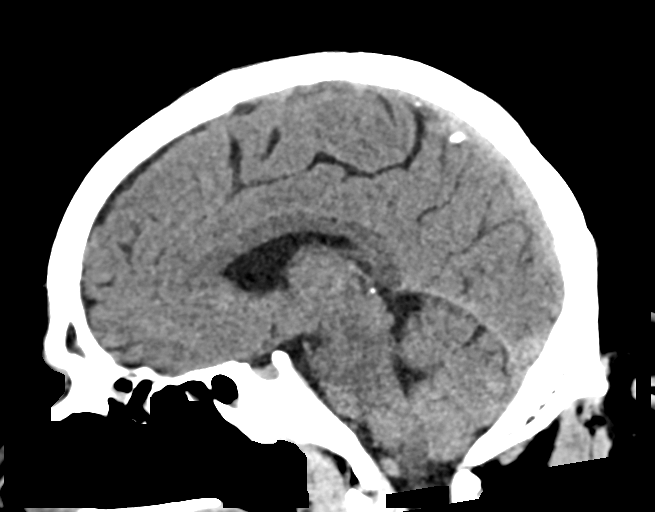
[im 43/64  brain]
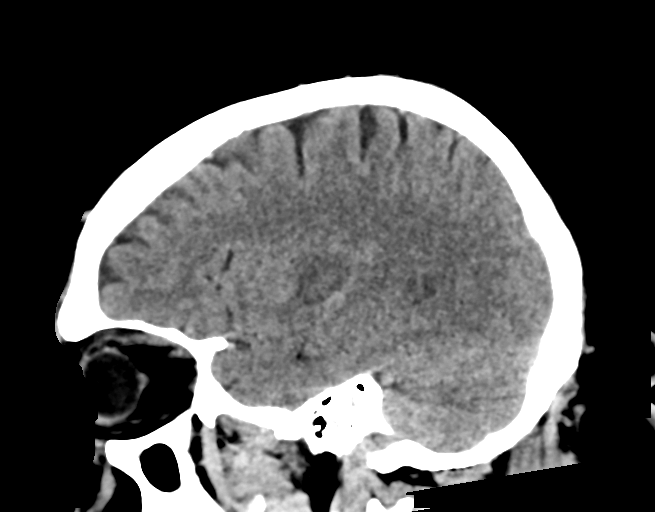

[16 of 47 positions shown; findings below may reference images not displayed]

FINDINGS: Brain: Progressive hypoattenuation in the right basal ganglia
infarct. No acute hemorrhage. No new mass effect or midline shift.
No hydrocephalus. No new lesion identified.

Vascular: No hyperdense vessel or unexpected calcification.

Skull: No fracture or other focal lesion.

Sinuses/Orbits: Mucosal thickening in maxillary sinuses. Partial
opacification of left frontal sinus and bilateral ethmoid air cells.
Debris in the sphenoid sinus.

Other: None
IMPRESSION: Progressive hypoattenuation in the non-hemorrhagic right basal
ganglia infarct. No acute findings.

## 2020-12-29 MED ORDER — AMLODIPINE BESYLATE 5 MG PO TABS
5.0000 mg | ORAL_TABLET | Freq: Every day | ORAL | Status: DC
  Administered 2020-12-29 – 2021-01-02 (×5): 5 mg via ORAL
  Filled 2020-12-29 (×5): qty 1

## 2020-12-29 MED ORDER — ATORVASTATIN CALCIUM 80 MG PO TABS
80.0000 mg | ORAL_TABLET | Freq: Every day | ORAL | Status: DC
  Administered 2020-12-29 – 2021-01-02 (×5): 80 mg via ORAL
  Filled 2020-12-29: qty 2
  Filled 2020-12-29 (×4): qty 1

## 2020-12-29 MED ORDER — HEPARIN SODIUM (PORCINE) 5000 UNIT/ML IJ SOLN
5000.0000 [IU] | Freq: Three times a day (TID) | INTRAMUSCULAR | Status: DC
  Administered 2020-12-29 – 2020-12-30 (×4): 5000 [IU] via SUBCUTANEOUS
  Filled 2020-12-29 (×5): qty 1

## 2020-12-29 MED ORDER — ASPIRIN 81 MG PO CHEW
81.0000 mg | CHEWABLE_TABLET | Freq: Every day | ORAL | Status: DC
  Administered 2020-12-29 – 2021-01-02 (×5): 81 mg via ORAL
  Filled 2020-12-29 (×5): qty 1

## 2020-12-29 NOTE — Progress Notes (Signed)
PT Cancellation Note  Patient Details Name: Aaron Gibson MRN: [id number] DOB: 1966/10/06   Cancelled Treatment:    Reason Eval/Treat Not Completed: Patient at procedure or test/unavailable  Arrived in ED and pt just being transported to 5C06. Arrived on [facility] unit and pt working with RN getting assessed.    Jerolyn Center, PT Pager [phone]  Zena Amos 12/29/2020, 4:15 PM

## 2020-12-29 NOTE — Procedures (Signed)
Patient Name: Aaron Gibson  MRN: [id number]  Epilepsy Attending: Charlsie Quest  Referring Physician/Provider: Dr Erick Blinks Date: 12/28/2020 Duration: 24.25 mins  Patient history: 55 year old male with waxing and waning weakness in left upper extremity and left leg.  EEG to evaluate for seizures.  Level of alertness: Awake, asleep  AEDs during EEG study: None  Technical aspects: This EEG study was done with scalp electrodes positioned according to the 10-20 International system of electrode placement. Electrical activity was acquired at a sampling rate of 500Hz  and reviewed with a high frequency filter of 70Hz  and a low frequency filter of 1Hz . EEG data were recorded continuously and digitally stored.   Description: The posterior dominant rhythm consists of 10 Hz activity of moderate voltage (25-35 uV) seen predominantly in posterior head regions, symmetric and reactive to eye opening and eye closing. Sleep was characterized by vertex waves, sleep spindles (12 to 14 Hz), maximal frontocentral region. Hyperventilation and photic stimulation were not performed.   IMPRESSION: This study is within normal limits. No seizures or epileptiform discharges were seen throughout the recording.  Aaron Gibson 

## 2020-12-29 NOTE — ED Notes (Signed)
Patient transported to vascular lab.

## 2020-12-29 NOTE — ED Notes (Signed)
Lunch Tray Ordered @ 1114.  

## 2020-12-29 NOTE — Progress Notes (Signed)
PT Cancellation Note  Patient Details Name: Sedric Guia MRN: [id number] DOB: [birth year]   Cancelled Treatment:    Reason Eval/Treat Not Completed: Active bedrest order  Noted bedrest order initiated 12/29/19 @ 1505 for 24 hours. Will monitor if able to come off bedrest this p.m. and evaluate as schedule permits    Jerolyn Center, PT Pager [phone]   Zena Amos 12/29/2020, 2:06 PM

## 2020-12-29 NOTE — Progress Notes (Signed)
  Echocardiogram 2D Echocardiogram has been performed.  Delcie Roch 12/29/2020, 11:38 AM

## 2020-12-29 NOTE — ED Notes (Signed)
Pt repositioned in bed with aid of this RN and family. Pt intermittently able to assist with affect left limb due to weakness. Pt A&Ox4.

## 2020-12-29 NOTE — Progress Notes (Addendum)
STROKE TEAM PROGRESS NOTE   SUBJECTIVE (INTERVAL HISTORY) Patient is sitting in bed in NAD. Sister is at bedside.  Patient states he has regained some strength on left side and feels pretty good.  He still has a slight droop, slurred speech and left side weakness. He endorses not taking his prescribed BP meds for quite some time due to making him gaining weight and not feel right. MRI scan of the brain shows a large right subcortical infarct. 2D echo shows ejection fraction of 65 to 70% without significant wall motion abnormalities. Patient blood pressure is adequately controlled. No neurological changes. No ICU bed is available at the present time.  OBJECTIVE Vitals:   12/29/20 1030 12/29/20 1145 12/29/20 1230 12/29/20 1300  BP: (!) 162/102 (!) 189/94 (!) 178/101 (!) 151/80  Pulse: 81 82 80 79  Resp: 16 14 (!) 22 20  Temp:      TempSrc:      SpO2: 96% 94% 95% 95%  Weight:      Height:         Lipid Panel:  Recent Labs  Lab 12/29/20 0415  CHOL 192  TRIG 78  HDL 38*  CHOLHDL 5.1  VLDL 16  LDLCALC 629*   HgbA1c:  Lab Results  Component Value Date   HGBA1C 6.5 (H) 12/29/2020   Urine Drug Screen: No results found for: LABOPIA, COCAINSCRNUR, LABBENZ, AMPHETMU, THCU, LABBARB  Alcohol Level No results found for: ETH  IMAGING  CT Head  No acute abnormality   CTA head/Neck  1. No large vessel occlusion or significant stenosis in the neck. 2. Subtle luminal irregularity throughout the bilateral MCA, ACA and PCA vascular tree may represent mild intracranial atherosclerotic disease without hemodynamically significant stenosis.  MRI Brain  1. Acute infarction within the posterior putamen, posterior body of the caudate and radiating white matter tracts on the right. No hemorrhage or mass effect. The remainder the brain appears normal without evidence of prior insult. 2. Mucosal inflammatory changes of the paranasal sinuses, which are small on a developmental basis.  EEG   Normal -no seizures   PHYSICAL EXAM   Physical Exam  Constitutional: Appears obese middle-aged African-American male.  Psych: Affect appropriate to situation Eyes: No scleral injection HENT: No OP obstrucion MSK: no joint deformities.  Cardiovascular: Normal rate and regular rhythm.  Respiratory: Effort normal, non-labored breathing GI: Soft.  No distension. There is no tenderness.  Skin: WDI  Neuro: Mental Status: Patient is awake, alert, oriented to person, place, month, year, and situation. Patient is able to give a clear and coherent history. Speech is slightly slurred  No signs of aphasia or neglect Cranial Nerves: II: Visual Fields are full. Pupils are equal, round, and reactive to light.   III,IV, VI: EOMI without ptosis or diploplia.  V: Facial sensation is symmetric to temperature VII: Facial movement is asymmetric. With mild left lower facial weakness VIII: hearing is intact to voice X: Uvula elevates symmetrically XI: Shoulder shrug is symmetric. XII: tongue is midline without atrophy or fasciculations.  Motor: Tone is normal. Bulk is normal. 5/5 strength on right.  Left upper and lower 4/5 Sensory: Sensation is decreased on left  Cerebellar: FNF and HKS are intact bilaterally  1a Level of Conscious.: 0 1b LOC Questions: 0 1c LOC Commands: 0 2 Best Gaze: 0 3 Visual: 0 4 Facial Palsy: 1 5a Motor Arm - left: 1 5b Motor Arm - Right: 0 6a Motor Leg - Left: 1 6b Motor Leg - Right:  0 7 Limb Ataxia: 0 8 Sensory: 1 9 Best Language: 0 10 Dysarthria: 1 11 Extinct. and Inatten.: 0 TOTAL: 5 Premorbid baseline modified Rankin scale of 0 ASSESSMENT/PLAN Mr. Farhad Burleson is a 55 y.o. male with history of obesity and HTN. Last visit to PCP was a year ago and per note, he was placed on Losartan. No further notes, so ? f/up or compliance. Also, found hx of tobacco abuse and there is documentation of being placed on Wellbutrin in past by PCP. Also, ? ADD but pt  apparently did not go for psych testing.   He received IV TPA 1115. No IR intervention.  NIHSS 10 ' Stroke:  right posterior putaman infarct-infarct too large for a  typical lacune and cryptogenic etiology should be considered status post IV tPA with slight improvement.  CT head nio abnormality   MRI head showed acute infarct, mismatch between DWI and T2/FLAIR imaging.  EKG NSR   2D Echo  Ef 65-70%  No intracardiac source of embolism  detected on this transthoracic study.  Will likely need TEE.  Consider loop recorder   LDL 138  HgbA1c 6.5  SCD's for VTE prophylaxis   Diet Order            Diet regular Room service appropriate? Yes; Fluid consistency: Thin  Diet effective now                  No antithrombotic prior to admission, now on aspirin 81 mg daily.   Patient counseled to be compliant with his antithrombotic medications  Ongoing aggressive stroke risk factor management  Therapy recommendations:  Pending   Disposition:  Pending   Hypertension  Stable  Received IV TPA BP <180/<105   Long-term BP goal normotensive  Will start norvasc 5mg  daily   Hyperlipidemia  Home meds:  No home meds   LDL 138 , goal < 70  Add atorvastatin 80mg  daily   Continue statin at discharge  Diabetes type II  HgbA1c 6.5, goal < 7.0  Other Stroke Risk Factors ETOH use, advised to drink no more than 1 drink(s) a day  Obesity, Body mass index is 40.47 kg/m., recommend weight loss, diet and exercise as appropriate   Other Active Problems  Hospital day # 1 Presenting with left hemiparesis and slurred speech due to large right subcortical infarct likely acute vision etiology. Recommend check transesophageal echocardiogram for cardiac source of embolism and if negative loop recorder insertion for paroxysmal A. fib. Continue strict control of blood pressure and close neurological monitoring as per post tPA protocol. Mobilize out of bed. Therapy consults. Continue ongoing  stroke work-up. Continue aspirin for stroke prevention and aggressive risk factor modification. Patient also appears to be at risk for sleep apnea and may consider possible participation in the sleep smart study and will be given information to review and decide. Long discussion with patient and sister at the bedside and answered questions.This patient is critically ill and at significant risk of neurological worsening, death and care requires constant monitoring of vital signs, hemodynamics,respiratory and cardiac monitoring, extensive review of multiple databases, frequent neurological assessment, discussion with family, other specialists and medical decision making of high complexity.I have made any additions or clarifications directly to the above note.This critical care time does not reflect procedure time, or teaching time or supervisory time of PA/NP/Med Resident etc but could involve care discussion time.  I spent 30 minutes of neurocritical care time  in the care of  this patient.  Antony Contras, MD To contact Stroke Continuity provider, please refer to [email or web address] After hours, contact General Neurology

## 2020-12-30 MED ORDER — ENOXAPARIN SODIUM 40 MG/0.4ML ~~LOC~~ SOLN
40.0000 mg | Freq: Every day | SUBCUTANEOUS | Status: DC
  Administered 2020-12-30 – 2021-01-01 (×3): 40 mg via SUBCUTANEOUS
  Filled 2020-12-30 (×3): qty 0.4

## 2020-12-30 MED ORDER — TRAZODONE HCL 50 MG PO TABS
50.0000 mg | ORAL_TABLET | Freq: Every day | ORAL | Status: DC
  Administered 2020-12-30 – 2021-01-01 (×3): 50 mg via ORAL
  Filled 2020-12-30 (×3): qty 1

## 2020-12-30 MED ORDER — PANTOPRAZOLE SODIUM 40 MG PO TBEC
40.0000 mg | DELAYED_RELEASE_TABLET | Freq: Every day | ORAL | Status: DC
  Administered 2020-12-31 – 2021-01-02 (×3): 40 mg via ORAL
  Filled 2020-12-30 (×3): qty 1

## 2020-12-30 MED ORDER — CLOPIDOGREL BISULFATE 75 MG PO TABS
75.0000 mg | ORAL_TABLET | Freq: Every day | ORAL | Status: DC
  Administered 2020-12-31 – 2021-01-02 (×3): 75 mg via ORAL
  Filled 2020-12-30 (×3): qty 1

## 2020-12-30 NOTE — Progress Notes (Addendum)
STROKE TEAM PROGRESS NOTE   SUBJECTIVE (INTERVAL HISTORY) Patient is sitting in chair eating lunch in NAD. Sister is at bedside.  Patient states he has regained some strength on left side and feels pretty good.  He still has a slight droop, slurred speech and left side weakness. TEE ordered. Therapy recommending CIR.  He is participating in the sleep smart stroke study and tested positive for sleep apnea on overnight NOx 3 monitor.  And will be given a trial of CPAP mask tonight to see if can tolerate to be included.  He is requesting a sleep aid, will place order.   OBJECTIVE Vitals:   12/29/20 2352 12/30/20 0423 12/30/20 0810 12/30/20 1210  BP: (!) 153/83 (!) 152/89 (!) 148/75 (!) 142/76  Pulse: 71 67 75 65  Resp: 18 18 18 19   Temp: 98.9 F (37.2 C) 98.9 F (37.2 C) 99.2 F (37.3 C) 98.5 F (36.9 C)  TempSrc: Oral Oral Oral Oral  SpO2: 96% 97% 95% 94%  Weight:      Height:         Lipid Panel:  Recent Labs  Lab 12/29/20 0415  CHOL 192  TRIG 78  HDL 38*  CHOLHDL 5.1  VLDL 16  LDLCALC 02/26/21*   HgbA1c:  Lab Results  Component Value Date   HGBA1C 6.5 (H) 12/29/2020   Urine Drug Screen: No results found for: LABOPIA, COCAINSCRNUR, LABBENZ, AMPHETMU, THCU, LABBARB  Alcohol Level No results found for: ETH  IMAGING  CT Head  No acute abnormality   CTA head/Neck  1. No large vessel occlusion or significant stenosis in the neck. 2. Subtle luminal irregularity throughout the bilateral MCA, ACA and PCA vascular tree may represent mild intracranial atherosclerotic disease without hemodynamically significant stenosis.  MRI Brain  1. Acute infarction within the posterior putamen, posterior body of the caudate and radiating white matter tracts on the right. No hemorrhage or mass effect. The remainder the brain appears normal without evidence of prior insult. 2. Mucosal inflammatory changes of the paranasal sinuses, which are small on a developmental basis.  EEG   Normal -no seizures   PHYSICAL EXAM   Physical Exam  Constitutional: Appears obese middle-aged African-American male.  Psych: Affect appropriate to situation Eyes: No scleral injection HENT: No OP obstrucion MSK: no joint deformities.  Cardiovascular: Normal rate and regular rhythm.  Respiratory: Effort normal, non-labored breathing GI: Soft.  No distension. There is no tenderness.  Skin: WDI  Neuro: Mental Status: Patient is awake, alert, oriented to person, place, month, year, and situation. Patient is able to give a clear and coherent history. Speech is slightly slurred  No signs of aphasia or neglect Cranial Nerves: II: Visual Fields are full. Pupils are equal, round, and reactive to light.   III,IV, VI: EOMI without ptosis or diploplia.  V: Facial sensation is symmetric to temperature VII: Facial movement is asymmetric. With mild left lower facial weakness VIII: hearing is intact to voice X: Uvula elevates symmetrically XI: Shoulder shrug is symmetric. XII: tongue is midline without atrophy or fasciculations.  Motor: Tone is normal. Bulk is normal. 5/5 strength on right.  Left upper and lower 4/5 Sensory: Sensation is decreased on left  Cerebellar: FNF and HKS are intact bilaterally  1a Level of Conscious.: 0 1b LOC Questions: 0 1c LOC Commands: 0 2 Best Gaze: 0 3 Visual: 0 4 Facial Palsy: 1 5a Motor Arm - left: 1 5b Motor Arm - Right: 0 6a Motor Leg - Left: 1 6b  Motor Leg - Right: 0 7 Limb Ataxia: 0 8 Sensory: 1 9 Best Language: 0 10 Dysarthria: 1 11 Extinct. and Inatten.: 0 TOTAL: 5 Premorbid baseline modified Rankin scale of 0  ASSESSMENT/PLAN [name] is a 55 y.o. male with history of obesity and HTN. Last visit to PCP was a year ago and per note, he was placed on Losartan. No further notes, so ? f/up or compliance. Also, found hx of tobacco abuse and there is documentation of being placed on Wellbutrin in past by PCP. Also, ? ADD but pt  apparently did not go for psych testing.   He received IV TPA 1115. No IR intervention.  NIHSS 10 ' Stroke:  right posterior putaman infarct-infarct too large for a  typical lacune and cryptogenic etiology should be considered status post IV tPA with slight improvement.  CT head nio abnormality   MRI head showed acute infarct, mismatch between DWI and T2/FLAIR imaging.  EKG NSR   2D Echo  Ef 65-70%  No intracardiac source of embolism  detected on this transthoracic study. TEE ordered.    LDL 138  HgbA1c 6.5  SCD's for VTE prophylaxis   Diet Order            Diet regular Room service appropriate? Yes; Fluid consistency: Thin  Diet effective now                 No antithrombotic prior to admission, now on aspirin 81 mg daily.   Patient counseled to be compliant with his antithrombotic medications  Ongoing aggressive stroke risk factor management  Therapy recommendations:  CIR  Disposition:  Pending   Hypertension  Stable . Received IV TPA BP <180/<105 .  Long-term BP goal normotensive .  norvasc 5mg  daily   Hyperlipidemia  Home meds:  No home meds   LDL 138 , goal < 70  Add atorvastatin 80mg  daily   Continue statin at discharge  Diabetes type II  HgbA1c 6.5, goal < 7.0  Other Stroke Risk Factors ETOH use, advised to drink no more than 1 drink(s) a day  Obesity, Body mass index is 40.47 kg/m., recommend weight loss, diet and exercise as appropriate   Other Active Problems  Hospital day # 2 Continue aspirin and aggressive risk factor modification.  TEE and loop recorder scheduled for Monday CPAP mask tolerability test tonight for sleep smart study.  Continue ongoing therapy and transfer to inpatient rehab in the next few days.  Long discussion with patient and sister at the bedside and answered questions.  Greater than 50% time during this 25-minute visit was spent on counseling and coordination of care about his cryptogenic stroke and answered  questions. , MD

## 2020-12-30 NOTE — Progress Notes (Signed)
    [facility] has been requested to perform a transesophageal echocardiogram on Aaron Gibson for stroke.  After careful review of history and examination, the risks and benefits of transesophageal echocardiogram have been explained including risks of esophageal damage, perforation (1:10,000 risk), bleeding, pharyngeal hematoma as well as other potential complications associated with conscious sedation including aspiration, arrhythmia, respiratory failure and death. Alternatives to treatment were discussed, questions were answered. Patient is willing to proceed.   Procedure scheduled for 01/02/2021 at 8:30am with Dr. Shari Prows. Will place orders.  Corrin Parker, PA-C 12/30/2020 4:12 PM

## 2020-12-30 NOTE — Progress Notes (Addendum)
Occupational Therapy Evaluation Patient Details Name: Aaron Gibson MRN: [id number] DOB: Mar 21, 1966 Today's Date: 12/30/2020    History of Present Illness 54yo male presenting as a code stroke with L sided weakness and slurred speech. TPA given, NIH score 10 at admit. MRI with acute infarcts in the posterior putamen, posterior body of the caudate, and right radiating white matter tracts. PMH morbid obesity, HTN   Clinical Impression   PTA pt lives alone independently. 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 Pt requires Mod A with squat pivot transfer toward R  And mod to Max A with LB ADL tasks due to deficits listed below. Pt is extremely motivated and has a very supportive family. Recommend CIR to maximize functional level of independence. Sister present during session and assisted with care.  Pt/family very appreciative. Will follow acutely. VSS during session.    Follow Up Recommendations  CIR;Supervision/Assistance - 24 hour    Equipment Recommendations  3 in 1 bedside commode;Other (comment) (will further assess)    Recommendations for Other Services Rehab consult     Precautions / Restrictions Precautions Precautions: Fall      Mobility Bed Mobility Overal bed mobility: Needs Assistance Bed Mobility: Supine to Sit     Supine to sit: Mod assist          Transfers Overall transfer level: Needs assistance Equipment used: Rolling walker (2 wheeled) Transfers: Sit to/from [employer] Sit to Stand: Mod assist   Squat pivot transfers: Mod assist toward R     General transfer comment: Good understanding of transfer technique    Balance Overall balance assessment: Needs assistance   Sitting balance-Leahy Scale: Fair       Standing balance-Leahy Scale: Poor                             ADL either performed or assessed with clinical judgement   ADL Overall ADL's : Needs  assistance/impaired Eating/Feeding: Set up;Sitting   Grooming: Minimal assistance   Upper Body Bathing: Minimal assistance;Sitting   Lower Body Bathing: Moderate assistance;Sit to/from stand   Upper Body Dressing : Moderate assistance;Sitting   Lower Body Dressing: Maximal assistance;Sit to/from stand   Toilet Transfer: Moderate assistance;Squat-pivot   Toileting- Clothing Manipulation and Hygiene: Maximal assistance       Functional mobility during ADLs: Moderate assistance (Will need increased assistance for ambulation)       Vision Baseline Vision/History: Wears glasses Wears Glasses: Distance only Vision Assessment?: Yes;No apparent visual deficits     Perception Perception Perception Tested?: Yes Comments: will further assess; appears intact   Praxis Praxis Praxis tested?: Deficits Praxis-Other Comments: sensory motor deficits    Pertinent Vitals/Pain Pain Assessment: No/denies pain     Hand Dominance Right   Extremity/Trunk Assessment Upper Extremity Assessment Upper Extremity Assessment: LUE deficits/detail LUE Deficits / Details: L sided weakness; apparent sensory motor deficits; able to lift off LUE from bed but requries assistance to move through full ROM in supien due to weakness; shoulder 2+/5; elbow 3/5; wrist/hand 3+/5. isolated movement patterns however unable to move into digit extension with wrist extension; poor in-hand manipulation LUE Sensation: decreased light touch LUE Coordination: decreased fine motor;decreased gross motor   Lower Extremity Assessment Lower Extremity Assessment: Defer to PT evaluation   Cervical / Trunk Assessment Cervical / Trunk Assessment: Other exceptions (L bias)   Communication Communication Communication: Expressive difficulties   Cognition Arousal/Alertness: Awake/alert  Behavior During Therapy: WFL for tasks assessed/performed Overall Cognitive Status: Within Functional Limits for tasks assessed                                  General Comments: Will further assess; appears [facility]   General Comments       Exercises Exercises: Other exercises Other Exercises Other Exercises: controlled movement patterns LUE sliding forward/back on arm rest;lift up cloth bring toward mouth then down without hiking shoulder   Shoulder Instructions      Home Living Family/patient expects to be discharged to:: Private residence Living Arrangements: Alone Available Help at Discharge: Family;Available 24 hours/day Type of Home: Apartment Home Access: Level entry     Home Layout: Two level;Bed/bath upstairs;1/2 bath on main level Alternate Level Stairs-Number of Steps: flight Alternate Level Stairs-Rails: Right Bathroom Shower/Tub: Tub/shower unit;Curtain   Bathroom Toilet: Standard Bathroom Accessibility: Yes How Accessible: Accessible via walker Home Equipment: None          Prior Functioning/Environment Level of Independence: Independent        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; did Chiropodist        OT Problem List: Decreased strength;Decreased range of motion;Decreased activity tolerance;Impaired balance (sitting and/or standing);Decreased coordination;Decreased safety awareness;Decreased knowledge of use of DME or AE;Impaired sensation;Impaired tone;Obesity;Impaired UE functional use      OT Treatment/Interventions: Self-care/ADL training;Therapeutic exercise;Neuromuscular education;DME and/or AE instruction;Therapeutic activities;Patient/family education;Balance training    OT Goals(Current goals can be found in the care plan section) Acute Rehab OT Goals Patient Stated Goal: to be able to take care of myself OT Goal Formulation: With patient Time For Goal Achievement: 01/13/21 Potential to Achieve Goals: Good  OT Frequency: Min 3X/week   Barriers to D/C:            Co-evaluation              AM-PAC OT "6 Clicks" Daily Activity     Outcome Measure  Help from another person eating meals?: A Little Help from another person taking care of personal grooming?: A Little Help from another person toileting, which includes using toliet, bedpan, or urinal?: A Lot Help from another person bathing (including washing, rinsing, drying)?: A Lot Help from another person to put on and taking off regular upper body clothing?: A Lot Help from another person to put on and taking off regular lower body clothing?: A Lot 6 Click Score: 14   End of Session Equipment Utilized During Treatment: Gait belt;Rolling walker Nurse Communication: Mobility status  Activity Tolerance: Patient tolerated treatment well Patient left: in chair;with call bell/phone within reach;with chair alarm set  OT Visit Diagnosis: Unsteadiness on feet (R26.81);Other abnormalities of gait and mobility (R26.89);Repeated falls (R29.6);Muscle weakness (generalized) (M62.81);Ataxia, unspecified (R27.0);Hemiplegia and hemiparesis Hemiplegia - Right/Left: Left Hemiplegia - dominant/non-dominant: Non-Dominant Hemiplegia - caused by: Cerebral infarction                Time: 1287-8676 OT Time Calculation (min): 38 min Charges:  OT General Charges $OT Visit: 1 Visit OT Evaluation $OT Eval Moderate Complexity: 1 Mod OT Treatments $Self Care/Home Management : 8-22 mins $Neuromuscular Re-education: 8-22 mins  Luisa Dago, OT/L   Acute OT Clinical Specialist Acute Rehabilitation Services Pager [phone] Office [phone]   [facility] 12/30/2020, 4:39 PM

## 2020-12-30 NOTE — Evaluation (Signed)
Physical Therapy Evaluation Patient Details Name: Aaron Gibson MRN: [id number] DOB: 22-Jan-1966 Today's Date: 12/30/2020   History of Present Illness  55yo male presenting as a code stroke with L sided weakness and slurred speech. TPA given, NIH score 10 at admit. MRI with acute infarcts in the posterior putamen, posterior body of the caudate, and right radiating white matter tracts. PMH morbid obesity, HTN  Clinical Impression   Patient received in recliner, very pleasant and motivated to participate in therapy. Demonstrates considerable L sided weakness, impaired sensation, and impaired proprioception with dynamic tasks this morning. Needed ModAx2 for functional transfers as well as ModAx2 with RW for gait training- able to gait train about 29f with heavy assistance for weight shifting and reciprocal gait pattern. Provided him with therband and educated on BLE exercises to do in bed and up in chair to help improve strength as well. Left up in recliner with all needs met, sister present and chair alarm active. Would very much benefit from CIR level therapies moving forward!     Follow Up Recommendations CIR;Supervision for mobility/OOB    Equipment Recommendations  Rolling walker with 5" wheels;3in1 (PT);Wheelchair (measurements PT);Wheelchair cushion (measurements PT)    Recommendations for Other Services       Precautions / Restrictions Precautions Precautions: Fall;Other (comment) Precaution Comments: L sided weakness, dizziness in standing Restrictions Weight Bearing Restrictions: No      Mobility  Bed Mobility               General bed mobility comments: up in recliner upon entry    Transfers Overall transfer level: Needs assistance Equipment used: Rolling walker (2 wheeled) Transfers: Sit to/from Stand Sit to Stand: Mod assist;+2 physical assistance         General transfer comment: ModAx2 to boost up to standing with RW, cues for hand placement and sequencing  and to correct mild-moderate L lean  Ambulation/Gait Ambulation/Gait assistance: Mod assist;+2 physical assistance Gait Distance (Feet): 2 Feet Assistive device: Rolling walker (2 wheeled) Gait Pattern/deviations: Step-to pattern;Decreased step length - right;Decreased stance time - left;Decreased stride length;Decreased dorsiflexion - left;Decreased weight shift to left;Shuffle;Trunk flexed;Narrow base of support Gait velocity: decreased   General Gait Details: needed ModAx2 to maintain balance as well as Mod assist to shift weight to allow for reciprocal stepping pattern; able to take 2 steps forward with RW and Mod-MaxA to help advance/control L LE. Poor L LE proprioception as a whole  SScience writer   Modified Rankin (Stroke Patients Only)       Balance Overall balance assessment: Needs assistance Sitting-balance support: No upper extremity supported;Feet supported Sitting balance-Leahy Scale: Poor Sitting balance - Comments: tendency for L lean Postural control: Left lateral lean Standing balance support: Bilateral upper extremity supported;During functional activity Standing balance-Leahy Scale: Poor Standing balance comment: reliant on BUE support, hard left lean especially wtih dynamic activities                             Pertinent Vitals/Pain Pain Assessment: No/denies pain    Home Living                        Prior Function                 Hand Dominance        Extremity/Trunk Assessment   Upper Extremity Assessment  Upper Extremity Assessment: Defer to OT evaluation    Lower Extremity Assessment Lower Extremity Assessment: RLE deficits/detail;LLE deficits/detail RLE Deficits / Details: WNL RLE Sensation: WNL RLE Coordination: WNL LLE Deficits / Details: ankle DF 4/5, quad 4-/5, hip flexor 2/5, seated hip ABD 3-/5 LLE Sensation: decreased proprioception LLE Coordination: decreased gross  motor;decreased fine motor    Cervical / Trunk Assessment Cervical / Trunk Assessment: Normal  Communication      Cognition Arousal/Alertness: Awake/alert Behavior During Therapy: WFL for tasks assessed/performed Overall Cognitive Status: Within Functional Limits for tasks assessed                                 General Comments: cooperative and motivated, follows cues well      General Comments      Exercises     Assessment/Plan    PT Assessment Patient needs continued PT services  PT Problem List Decreased knowledge of use of DME;Obesity;Decreased strength;Decreased activity tolerance;Decreased safety awareness;Decreased balance;Decreased knowledge of precautions;Decreased mobility;Decreased coordination;Impaired sensation       PT Treatment Interventions DME instruction;Balance training;Gait training;Neuromuscular re-education;Stair training;Functional mobility training;Patient/family education;Therapeutic activities;Therapeutic exercise;Wheelchair mobility training    PT Goals (Current goals can be found in the Care Plan section)  Acute Rehab PT Goals Patient Stated Goal: go to rehab PT Goal Formulation: With patient/family Time For Goal Achievement: 01/13/21 Potential to Achieve Goals: Good    Frequency Min 4X/week   Barriers to discharge        Co-evaluation               AM-PAC PT "6 Clicks" Mobility  Outcome Measure Help needed turning from your back to your side while in a flat bed without using bedrails?: A Little Help needed moving from lying on your back to sitting on the side of a flat bed without using bedrails?: A Lot Help needed moving to and from a bed to a chair (including a wheelchair)?: A Lot Help needed standing up from a chair using your arms (e.g., wheelchair or bedside chair)?: A Lot Help needed to walk in hospital room?: A Lot Help needed climbing 3-5 steps with a railing? : Total 6 Click Score: 12    End of Session  Equipment Utilized During Treatment: Gait belt Activity Tolerance: Patient tolerated treatment well Patient left: in chair;with call bell/phone within reach;with chair alarm set;with family/visitor present Nurse Communication: Mobility status;Need for lift equipment;Precautions PT Visit Diagnosis: Unsteadiness on feet (R26.81);Other symptoms and signs involving the nervous system (R29.898);Difficulty in walking, not elsewhere classified (R26.2);Hemiplegia and hemiparesis Hemiplegia - Right/Left: Left Hemiplegia - dominant/non-dominant: Non-dominant Hemiplegia - caused by: Cerebral infarction    Time: 0945-1010 PT Time Calculation (min) (ACUTE ONLY): 25 min   Charges:   PT Evaluation $PT Eval Moderate Complexity: 1 Mod PT Treatments $Gait Training: 8-22 mins        Windell Norfolk, DPT, PN1   Supplemental Physical Therapist [place]    Pager [phone] Acute Rehab Office [phone]

## 2020-12-30 NOTE — Progress Notes (Signed)
Inpatient Rehab Admissions Coordinator Note:   Per PT recommendations, pt was screened for CIR candidacy by Estill Dooms, PT, DPT.  At this time we are recommending a cir consult and I will place an order per our protocol.  Please contact me with questions.   Estill Dooms, PT, DPT [phone] 12/30/20 3:57 PM

## 2020-12-31 DIAGNOSIS — I63311 Cerebral infarction due to thrombosis of right middle cerebral artery: Secondary | ICD-10-CM

## 2020-12-31 LAB — CBC
HCT: 48 % (ref 39.0–52.0)
Hemoglobin: 15.6 g/dL (ref 13.0–17.0)
MCH: 29 pg (ref 26.0–34.0)
MCHC: 32.5 g/dL (ref 30.0–36.0)
MCV: 89.2 fL (ref 80.0–100.0)
Platelets: 167 10*3/uL (ref 150–400)
RBC: 5.38 MIL/uL (ref 4.22–5.81)
RDW: 12.8 % (ref 11.5–15.5)
WBC: 11.5 10*3/uL — ABNORMAL HIGH (ref 4.0–10.5)
nRBC: 0 % (ref 0.0–0.2)

## 2020-12-31 LAB — BASIC METABOLIC PANEL
Anion gap: 11 (ref 5–15)
BUN: 7 mg/dL (ref 6–20)
CO2: 24 mmol/L (ref 22–32)
Calcium: 8.9 mg/dL (ref 8.9–10.3)
Chloride: 106 mmol/L (ref 98–111)
Creatinine, Ser: 1.02 mg/dL (ref 0.61–1.24)
GFR, Estimated: >60 mL/min (ref >60)
Glucose, Bld: 102 mg/dL — ABNORMAL HIGH (ref 70–99)
Potassium: 4 mmol/L (ref 3.5–5.1)
Sodium: 141 mmol/L (ref 135–145)

## 2020-12-31 NOTE — Progress Notes (Signed)
Physical Therapy Treatment Patient Details Name: Aaron Gibson MRN: [id number] DOB: [birth year] Today's Date: 12/31/2020    History of Present Illness 55yo male presenting as a code stroke with L sided weakness and slurred speech. TPA given, NIH score 10 at admit. MRI with acute infarcts in the posterior putamen, posterior body of the caudate, and right radiating white matter tracts. PMH morbid obesity, HTN    PT Comments    The pt was able to demo good improvements in mobility and PT goals at this time. He was able to make improvements within session, requiring decreased assist to complete sit-stand and stand-pivot transfers. He does continue to benefit from [facility] for attending to LUE as well as cues for positioning and technique with transfers and gait. The pt was able to complete a few short bouts of ambulation within the session with seated rest between each bout and chair follow for safety. The pt will continue to benefit from intensive therapies to achieve pt goal of return to independence, as well as to improve strength, control, and endurance in L-side. Continue to recommend CIR.    Follow Up Recommendations  CIR;Supervision for mobility/OOB     Equipment Recommendations  Rolling walker with 5" wheels;3in1 (PT);Wheelchair (measurements PT);Wheelchair cushion (measurements PT)    Recommendations for Other Services       Precautions / Restrictions Precautions Precautions: Fall Precaution Comments: L sided weakness, dizziness in standing Restrictions Weight Bearing Restrictions: No    Mobility  Bed Mobility Overal bed mobility: Needs Assistance Bed Mobility: Supine to Sit     Supine to sit: Min assist     General bed mobility comments: minA to assist with trunk elevation from [facility] and steady in sitting. pt able to move BLE  Transfers Overall transfer level: Needs assistance Equipment used: Rolling walker (2 wheeled) Transfers: Sit to/from [employer] Sit to  Stand: Min assist Stand pivot transfers: Mod assist;Min assist       General transfer comment: pt requiring modA to power up and steady, able to complete x10 through session with improvement to minA with VC for hand positioning and minA to LUE for positioning  Ambulation/Gait Ambulation/Gait assistance: Mod assist;Min assist Gait Distance (Feet): 10 Feet (+ 75ft + 24ft + 20 ft) Assistive device: Rolling walker (2 wheeled) Gait Pattern/deviations: Step-to pattern;Decreased step length - right;Decreased stance time - left;Decreased stride length;Decreased dorsiflexion - left;Decreased weight shift to left;Shuffle;Trunk flexed;Narrow base of support Gait velocity: decreased Gait velocity interpretation: <1.31 ft/sec, indicative of household ambulator General Gait Details: modA initially with verbal cues for LLE positioning, L grip on RW, and LLE clearance. Pt with improved fluidity with practice, continues to beenfit from cues and intermittent cues for posture/positioning in RW. poor proprioception/coordination with LLE    Modified Rankin (Stroke Patients Only) Modified Rankin (Stroke Patients Only) Pre-Morbid Rankin Score: No symptoms Modified Rankin: Moderately severe disability     Balance Overall balance assessment: Needs assistance Sitting-balance support: No upper extremity supported;Feet supported Sitting balance-Leahy Scale: Fair Sitting balance - Comments: able to static sit on flat bed without UE support   Standing balance support: Bilateral upper extremity supported;During functional activity Standing balance-Leahy Scale: Poor Standing balance comment: reliant on BUE support and mod/minA from PT                            Cognition Arousal/Alertness: Awake/alert Behavior During Therapy: WFL for tasks assessed/performed Overall Cognitive Status: Within Functional Limits for tasks assessed  General Comments:  pleasant and able to integrate cues for technique well through session      Exercises General Exercises - Lower Extremity Hip Flexion/Marching: AROM;Both;10 reps;Standing Mini-Sqauts: AROM;10 reps Other Exercises Other Exercises: sit-stand with RLE extended to increase wt bearing through LLE x5    General Comments        Pertinent Vitals/Pain Pain Assessment: Faces Faces Pain Scale: Hurts a little bit Pain Location: back pain Pain Descriptors / Indicators: Discomfort Pain Intervention(s): Monitored during session           PT Goals (current goals can now be found in the care plan section) Acute Rehab PT Goals Patient Stated Goal: to be able to take care of myself PT Goal Formulation: With patient/family Time For Goal Achievement: 01/13/21 Potential to Achieve Goals: Good Progress towards PT goals: Progressing toward goals    Frequency    Min 4X/week      PT Plan Current plan remains appropriate       AM-PAC PT "6 Clicks" Mobility   Outcome Measure  Help needed turning from your back to your side while in a flat bed without using bedrails?: A Little Help needed moving from lying on your back to sitting on the side of a flat bed without using bedrails?: A Little Help needed moving to and from a bed to a chair (including a wheelchair)?: A Lot Help needed standing up from a chair using your arms (e.g., wheelchair or bedside chair)?: A Lot Help needed to walk in hospital room?: A Lot Help needed climbing 3-5 steps with a railing? : Total 6 Click Score: 13    End of Session Equipment Utilized During Treatment: Gait belt Activity Tolerance: Patient tolerated treatment well Patient left: in chair;with call bell/phone within reach;with chair alarm set;with family/visitor present Nurse Communication: Mobility status PT Visit Diagnosis: Unsteadiness on feet (R26.81);Other symptoms and signs involving the nervous system (R29.898);Difficulty in walking, not elsewhere  classified (R26.2);Hemiplegia and hemiparesis Hemiplegia - Right/Left: Left Hemiplegia - dominant/non-dominant: Non-dominant Hemiplegia - caused by: Cerebral infarction     Time: 3335-4562 PT Time Calculation (min) (ACUTE ONLY): 35 min  Charges:  $Gait Training: 8-22 mins $Neuromuscular Re-education: 8-22 mins                     Rolm Baptise, PT, DPT   Acute Rehabilitation Department Pager #: [phone]   Gaetana Michaelis 12/31/2020, 12:31 PM

## 2020-12-31 NOTE — Progress Notes (Signed)
Inpatient Rehab Admissions:  Inpatient Rehab Consult received.  I met with patient and sister, York Cerise, at the bedside for rehabilitation assessment and to discuss goals and expectations of an inpatient rehab admission.  Both acknowledged understanding of goals and expectations. Both interested in pt pursuing CIR.  Pt appears to be an appropriate candidate for potential admission.  Left message with [facility] team member Elie Confer Revels per pt request.  Will continue to follow.  Signed: Gayland Curry, [place], [place] Admissions Coordinator [phone]

## 2020-12-31 NOTE — Progress Notes (Signed)
STROKE TEAM PROGRESS NOTE   SUBJECTIVE (INTERVAL HISTORY) Wife at the bedside. Patient sitting in chair, awake alert, still has left hemiparesis but improving. Walked with PT, still has dragging on the left leg. Pending TEE.  OBJECTIVE Vitals:   12/30/20 1642 12/30/20 2004 12/31/20 0000 12/31/20 0551  BP: (!) 154/68 (!) 159/85 (!) 178/97 (!) 163/91  Pulse: 67 74 92 75  Resp: 18 20 20  (!) 22  Temp: 98.3 F (36.8 C) 98.4 F (36.9 C) 98.4 F (36.9 C) 98.5 F (36.9 C)  TempSrc: Oral Oral Oral Oral  SpO2: 93% 100% 100% 92%  Weight:      Height:         Lipid Panel:  Recent Labs  Lab 12/29/20 0415  CHOL 192  TRIG 78  HDL 38*  CHOLHDL 5.1  VLDL 16  LDLCALC 02/26/21*   HgbA1c:  Lab Results  Component Value Date   HGBA1C 6.5 (H) 12/29/2020   Urine Drug Screen: No results found for: LABOPIA, COCAINSCRNUR, LABBENZ, AMPHETMU, THCU, LABBARB  Alcohol Level No results found for: ETH  IMAGING  CT Head  No acute abnormality   CTA head/Neck  1. No large vessel occlusion or significant stenosis in the neck. 2. Subtle luminal irregularity throughout the bilateral MCA, ACA and PCA vascular tree may represent mild intracranial atherosclerotic disease without hemodynamically significant stenosis.  MRI Brain  1. Acute infarction within the posterior putamen, posterior body of the caudate and radiating white matter tracts on the right. No hemorrhage or mass effect. The remainder the brain appears normal without evidence of prior insult. 2. Mucosal inflammatory changes of the paranasal sinuses, which are small on a developmental basis.  EEG  Normal -no seizures     PHYSICAL EXAM  Temp:  [98.3 F (36.8 C)-98.5 F (36.9 C)] 98.5 F (36.9 C) (01/08 1157) Pulse Rate:  [67-92] 75 (01/08 1157) Resp:  [16-22] 16 (01/08 1157) BP: (154-178)/(68-97) 172/94 (01/08 1157) SpO2:  [92 %-100 %] 98 % (01/08 1157)  General - Well nourished, well developed, in no apparent  distress.  Ophthalmologic - fundi not visualized due to noncooperation.  Cardiovascular - Regular rhythm and rate.  Mental Status -  Level of arousal and orientation to time, place, and person were intact. Language including expression, naming, repetition, comprehension was assessed and found intact. Mild dysarthria Fund of Knowledge was assessed and was intact.  Cranial Nerves II - XII - II - Visual field intact OU. III, IV, VI - Extraocular movements intact. V - Facial sensation intact bilaterally. VII - left facial droop. VIII - Hearing & vestibular intact bilaterally. X - Palate elevates symmetrically. Mild dysarthria XI - Chin turning & shoulder shrug intact bilaterally. XII - Tongue protrusion intact.  Motor Strength - The patient's strength was normal in right upper and lower extremities, however left upper extremity proximal 3/5, distal 3+/5. Left lower extremity proximal 4+/5, distal 4/5.  Bulk was normal and fasciculations were absent.   Motor Tone - Muscle tone was assessed at the neck and appendages and was normal.  Reflexes - The patient's reflexes were symmetrical in all extremities and he had no pathological reflexes.  Sensory - Light touch, temperature/pinprick were assessed and were symmetrical.    Coordination - The patient had normal movements in the right hand with no ataxia or dysmetria. Left finger-to-nose dysmetria and slow, however not out of proportion to weakness. Tremor was absent.  Gait and Station - deferred.    ASSESSMENT/PLAN [name] is a  55 y.o. male with history of obesity and HTN. Last visit to PCP was a year ago and per note, he was placed on Losartan. No further notes, so ? f/up or compliance. Also, found hx of tobacco abuse and there is documentation of being placed on Wellbutrin in past by PCP. Also, ? ADD but pt apparently did not go for psych testing.   He received IV TPA 1115 Wednesday 12/28/20. No IR intervention.  NIHSS  10  Stroke:  right BG infarct, likely small vessel disease  CT head no abnormality   MRI head showed acute right BG infarct, mismatch between DWI and T2/FLAIR imaging.  CT head and neck Subtle luminal irregularity throughout the bilateral MCA, ACA and PCA vascular tree may represent mild intracranial atherosclerotic disease without hemodynamically significant stenosis.  EKG NSR   2D Echo  EF 65-70%  No intracardiac source of embolism  TEE pending  LDL 138  HgbA1c 6.5  SCD's for VTE prophylaxis  No antithrombotic prior to admission, now on aspirin 81 mg daily and Plavix 75 DAPT for 3 weeks and then aspirin alone.  Patient counseled to be compliant with his antithrombotic medications  Ongoing aggressive stroke risk factor management  Therapy recommendations:  CIR  Disposition:  Pending   Hypertension  Stable  Norvasc 5mg  daily . Long-term BP goal normotensive  Hyperlipidemia  Home meds:  none   LDL 138 , goal < 70  Add atorvastatin 80mg  daily   Continue statin at discharge  Diabetes type II  HgbA1c 6.5, goal < 7.0  SSI  CBG monitoring  PCP follow-up  Tobacco abuse  Current smoker  Smoking cessation counseling provided  Nicotine patch provided  Pt is willing to quit  Other Stroke Risk Factors  ETOH use, advised to drink no more than 1 drink(s) a day  Obesity, Body mass index is 40.47 kg/m., recommend weight loss, diet and exercise as appropriate   Other Active Problems, Findings, Recommendations and/or Plan  Sleep Smart Study - Pt tested positive for sleep apnea -> CPAP mask tolerability test  Hospital day # 3  , MD PhD Stroke Neurology 12/31/2020 2:17 PM

## 2021-01-01 LAB — CBC
HCT: 49.7 % (ref 39.0–52.0)
Hemoglobin: 16.2 g/dL (ref 13.0–17.0)
MCH: 28.8 pg (ref 26.0–34.0)
MCHC: 32.6 g/dL (ref 30.0–36.0)
MCV: 88.3 fL (ref 80.0–100.0)
Platelets: 162 10*3/uL (ref 150–400)
RBC: 5.63 MIL/uL (ref 4.22–5.81)
RDW: 12.9 % (ref 11.5–15.5)
WBC: 10.8 10*3/uL — ABNORMAL HIGH (ref 4.0–10.5)
nRBC: 0 % (ref 0.0–0.2)

## 2021-01-01 LAB — BASIC METABOLIC PANEL
Anion gap: 11 (ref 5–15)
BUN: 9 mg/dL (ref 6–20)
CO2: 21 mmol/L — ABNORMAL LOW (ref 22–32)
Calcium: 8.7 mg/dL — ABNORMAL LOW (ref 8.9–10.3)
Chloride: 107 mmol/L (ref 98–111)
Creatinine, Ser: 1.02 mg/dL (ref 0.61–1.24)
GFR, Estimated: >60 mL/min (ref >60)
Glucose, Bld: 114 mg/dL — ABNORMAL HIGH (ref 70–99)
Potassium: 4 mmol/L (ref 3.5–5.1)
Sodium: 139 mmol/L (ref 135–145)

## 2021-01-01 MED ORDER — METHOCARBAMOL 500 MG PO TABS
500.0000 mg | ORAL_TABLET | Freq: Three times a day (TID) | ORAL | Status: DC
  Administered 2021-01-01 – 2021-01-02 (×3): 500 mg via ORAL
  Filled 2021-01-01 (×4): qty 1

## 2021-01-01 MED ORDER — METHOCARBAMOL 500 MG PO TABS: 500.0000 mg | ORAL_TABLET | Freq: Three times a day (TID) | ORAL | Status: DC | PRN

## 2021-01-01 MED ORDER — COVID-19 MRNA VACCINE (PFIZER) 30 MCG/0.3ML IM SUSP
0.3000 mL | Freq: Once | INTRAMUSCULAR | Status: AC
  Administered 2021-01-01: 0.3 mL via INTRAMUSCULAR
  Filled 2021-01-01: qty 0.3

## 2021-01-01 NOTE — Progress Notes (Signed)
STROKE TEAM PROGRESS NOTE   SUBJECTIVE (INTERVAL HISTORY) Wife at the bedside. Patient sitting in chair, awake alert, no acute event over night. Pending TEE tomorrow. Pt agreed with covid vaccine after discussed with me and I told them there is no contraindication regarding stroke and covid vaccine.   OBJECTIVE Vitals:   12/31/20 1818 12/31/20 1952 12/31/20 2349 01/01/21 0411  BP: (!) 155/97 (!) 168/97 (!) 163/86 (!) 165/88  Pulse: 77 74 75 72  Resp: 16 18 19 18   Temp: 98.5 F (36.9 C) 98 F (36.7 C) 98.5 F (36.9 C) 98 F (36.7 C)  TempSrc: Oral Oral Oral Oral  SpO2: 92% 95% 98% 94%  Weight:      Height:         Lipid Panel:  Recent Labs  Lab 12/29/20 0415  CHOL 192  TRIG 78  HDL 38*  CHOLHDL 5.1  VLDL 16  LDLCALC 02/26/21*   HgbA1c:  Lab Results  Component Value Date   HGBA1C 6.5 (H) 12/29/2020   Urine Drug Screen: No results found for: LABOPIA, COCAINSCRNUR, LABBENZ, AMPHETMU, THCU, LABBARB  Alcohol Level No results found for: ETH  IMAGING  CT Head  No acute abnormality   CTA head/Neck  1. No large vessel occlusion or significant stenosis in the neck. 2. Subtle luminal irregularity throughout the bilateral MCA, ACA and PCA vascular tree may represent mild intracranial atherosclerotic disease without hemodynamically significant stenosis.  MRI Brain  1. Acute infarction within the posterior putamen, posterior body of the caudate and radiating white matter tracts on the right. No hemorrhage or mass effect. The remainder the brain appears normal without evidence of prior insult. 2. Mucosal inflammatory changes of the paranasal sinuses, which are small on a developmental basis.  EEG  Normal -no seizures     PHYSICAL EXAM  Temp:  [98 F (36.7 C)-98.5 F (36.9 C)] 98 F (36.7 C) (01/09 0411) Pulse Rate:  [72-77] 72 (01/09 0411) Resp:  [16-22] 18 (01/09 0411) BP: (155-172)/(86-97) 165/88 (01/09 0411) SpO2:  [92 %-98 %] 94 % (01/09 0411)  General -  Well nourished, well developed, in no apparent distress.  Ophthalmologic - fundi not visualized due to noncooperation.  Cardiovascular - Regular rhythm and rate.  Mental Status -  Level of arousal and orientation to time, place, and person were intact. Language including expression, naming, repetition, comprehension was assessed and found intact. Mild dysarthria Fund of Knowledge was assessed and was intact.  Cranial Nerves II - XII - II - Visual field intact OU. III, IV, VI - Extraocular movements intact. V - Facial sensation intact bilaterally. VII - left facial droop. VIII - Hearing & vestibular intact bilaterally. X - Palate elevates symmetrically. Mild dysarthria XI - Chin turning & shoulder shrug intact bilaterally. XII - Tongue protrusion intact.  Motor Strength - The patient's strength was normal in right upper and lower extremities, however left upper extremity proximal 3/5, distal 3+/5. Left lower extremity proximal 4+/5, distal 4/5.  Bulk was normal and fasciculations were absent.   Motor Tone - Muscle tone was assessed at the neck and appendages and was normal.  Reflexes - The patient's reflexes were symmetrical in all extremities and he had no pathological reflexes.  Sensory - Light touch, temperature/pinprick were assessed and were symmetrical.    Coordination - The patient had normal movements in the right hand with no ataxia or dysmetria. Left finger-to-nose dysmetria and slow, however not out of proportion to weakness. Tremor was absent.  Gait and  Station - deferred.    ASSESSMENT/PLAN Mr. Irwin Toran is a 55 y.o. male with history of obesity and HTN. Last visit to PCP was a year ago and per note, he was placed on Losartan. No further notes, so ? f/up or compliance. Also, found hx of tobacco abuse and there is documentation of being placed on Wellbutrin in past by PCP. Also, ? ADD but pt apparently did not go for psych testing.   He received IV TPA 1115  Wednesday 12/28/20. No IR intervention.  NIHSS 10  Stroke:  right BG infarct, likely small vessel disease  CT head no abnormality   MRI head showed acute right BG infarct, mismatch between DWI and T2/FLAIR imaging.  CT head and neck Subtle luminal irregularity throughout the bilateral MCA, ACA and PCA vascular tree may represent mild intracranial atherosclerotic disease without hemodynamically significant stenosis.  EKG NSR   2D Echo  EF 65-70%  No intracardiac source of embolism  TEE pending  LDL 138  HgbA1c 6.5  SCD's for VTE prophylaxis  No antithrombotic prior to admission, now on aspirin 81 mg daily and Plavix 75 DAPT for 3 weeks and then aspirin alone.  Patient counseled to be compliant with his antithrombotic medications  Ongoing aggressive stroke risk factor management  Therapy recommendations:  CIR  Disposition:  Pending   Hypertension  Stable  Norvasc 5mg  daily . Long-term BP goal normotensive  Hyperlipidemia  Home meds:  none   LDL 138 , goal < 70  Add atorvastatin 80mg  daily   Continue statin at discharge  Diabetes type II  HgbA1c 6.5, goal < 7.0  SSI  CBG monitoring  PCP follow-up  Tobacco abuse  Current smoker  Smoking cessation counseling provided  Nicotine patch provided  Pt is willing to quit  Other Stroke Risk Factors  ETOH use, advised to drink no more than 1 drink(s) a day  Obesity, Body mass index is 40.47 kg/m., recommend weight loss, diet and exercise as appropriate   Other Active Problems, Findings, Recommendations and/or Plan  Sleep Smart Study - Pt tested positive for sleep apnea -> CPAP mask tolerability test  Ok to have covid vaccine - will order  Hospital day # 4  , MD PhD Stroke Neurology 01/01/2021 2:07 PM

## 2021-01-01 NOTE — Progress Notes (Signed)
Pt sleeping soundly,  rr unlabored, skin w/d. Pt sister at bedside sleeping soundly

## 2021-01-01 NOTE — H&P (View-Only) (Signed)
STROKE TEAM PROGRESS NOTE   SUBJECTIVE (INTERVAL HISTORY) Wife at the bedside. Patient sitting in chair, awake alert, no acute event over night. Pending TEE tomorrow. Pt agreed with covid vaccine after discussed with me and I told them there is no contraindication regarding stroke and covid vaccine.   OBJECTIVE Vitals:   12/31/20 1818 12/31/20 1952 12/31/20 2349 01/01/21 0411  BP: (!) 155/97 (!) 168/97 (!) 163/86 (!) 165/88  Pulse: 77 74 75 72  Resp: 16 18 19 18   Temp: 98.5 F (36.9 C) 98 F (36.7 C) 98.5 F (36.9 C) 98 F (36.7 C)  TempSrc: Oral Oral Oral Oral  SpO2: 92% 95% 98% 94%  Weight:      Height:         Lipid Panel:  Recent Labs  Lab 12/29/20 0415  CHOL 192  TRIG 78  HDL 38*  CHOLHDL 5.1  VLDL 16  LDLCALC 02/26/21*   HgbA1c:  Lab Results  Component Value Date   HGBA1C 6.5 (H) 12/29/2020   Urine Drug Screen: No results found for: LABOPIA, COCAINSCRNUR, LABBENZ, AMPHETMU, THCU, LABBARB  Alcohol Level No results found for: ETH  IMAGING  CT Head  No acute abnormality   CTA head/Neck  1. No large vessel occlusion or significant stenosis in the neck. 2. Subtle luminal irregularity throughout the bilateral MCA, ACA and PCA vascular tree may represent mild intracranial atherosclerotic disease without hemodynamically significant stenosis.  MRI Brain  1. Acute infarction within the posterior putamen, posterior body of the caudate and radiating white matter tracts on the right. No hemorrhage or mass effect. The remainder the brain appears normal without evidence of prior insult. 2. Mucosal inflammatory changes of the paranasal sinuses, which are small on a developmental basis.  EEG  Normal -no seizures     PHYSICAL EXAM  Temp:  [98 F (36.7 C)-98.5 F (36.9 C)] 98 F (36.7 C) (01/09 0411) Pulse Rate:  [72-77] 72 (01/09 0411) Resp:  [16-22] 18 (01/09 0411) BP: (155-172)/(86-97) 165/88 (01/09 0411) SpO2:  [92 %-98 %] 94 % (01/09 0411)  General -  Well nourished, well developed, in no apparent distress.  Ophthalmologic - fundi not visualized due to noncooperation.  Cardiovascular - Regular rhythm and rate.  Mental Status -  Level of arousal and orientation to time, place, and person were intact. Language including expression, naming, repetition, comprehension was assessed and found intact. Mild dysarthria Fund of Knowledge was assessed and was intact.  Cranial Nerves II - XII - II - Visual field intact OU. III, IV, VI - Extraocular movements intact. V - Facial sensation intact bilaterally. VII - left facial droop. VIII - Hearing & vestibular intact bilaterally. X - Palate elevates symmetrically. Mild dysarthria XI - Chin turning & shoulder shrug intact bilaterally. XII - Tongue protrusion intact.  Motor Strength - The patient's strength was normal in right upper and lower extremities, however left upper extremity proximal 3/5, distal 3+/5. Left lower extremity proximal 4+/5, distal 4/5.  Bulk was normal and fasciculations were absent.   Motor Tone - Muscle tone was assessed at the neck and appendages and was normal.  Reflexes - The patient's reflexes were symmetrical in all extremities and he had no pathological reflexes.  Sensory - Light touch, temperature/pinprick were assessed and were symmetrical.    Coordination - The patient had normal movements in the right hand with no ataxia or dysmetria. Left finger-to-nose dysmetria and slow, however not out of proportion to weakness. Tremor was absent.  Gait and  Station - deferred.    ASSESSMENT/PLAN [name] is a 55 y.o. male with history of obesity and HTN. Last visit to PCP was a year ago and per note, he was placed on Losartan. No further notes, so ? f/up or compliance. Also, found hx of tobacco abuse and there is documentation of being placed on Wellbutrin in past by PCP. Also, ? ADD but pt apparently did not go for psych testing.   He received IV TPA 1115  Wednesday 12/28/20. No IR intervention.  NIHSS 10  Stroke:  right BG infarct, likely small vessel disease  CT head no abnormality   MRI head showed acute right BG infarct, mismatch between DWI and T2/FLAIR imaging.  CT head and neck Subtle luminal irregularity throughout the bilateral MCA, ACA and PCA vascular tree may represent mild intracranial atherosclerotic disease without hemodynamically significant stenosis.  EKG NSR   2D Echo  EF 65-70%  No intracardiac source of embolism  TEE pending  LDL 138  HgbA1c 6.5  SCD's for VTE prophylaxis  No antithrombotic prior to admission, now on aspirin 81 mg daily and Plavix 75 DAPT for 3 weeks and then aspirin alone.  Patient counseled to be compliant with his antithrombotic medications  Ongoing aggressive stroke risk factor management  Therapy recommendations:  CIR  Disposition:  Pending   Hypertension  Stable  Norvasc 5mg daily . Long-term BP goal normotensive  Hyperlipidemia  Home meds:  none   LDL 138 , goal < 70  Add atorvastatin 80mg daily   Continue statin at discharge  Diabetes type II  HgbA1c 6.5, goal < 7.0  SSI  CBG monitoring  PCP follow-up  Tobacco abuse  Current smoker  Smoking cessation counseling provided  Nicotine patch provided  Pt is willing to quit  Other Stroke Risk Factors  ETOH use, advised to drink no more than 1 drink(s) a day  Obesity, Body mass index is 40.47 kg/m., recommend weight loss, diet and exercise as appropriate   Other Active Problems, Findings, Recommendations and/or Plan  Sleep Smart Study - Pt tested positive for sleep apnea -> CPAP mask tolerability test  Ok to have covid vaccine - will order  Hospital day # 4  Marissia Blackham, MD PhD Stroke Neurology 01/01/2021 2:07 PM   

## 2021-01-01 NOTE — Progress Notes (Signed)
Pt/pt sister remain asleep

## 2021-01-02 ENCOUNTER — Inpatient Hospital Stay (HOSPITAL_COMMUNITY): Payer: Medicaid Other | Admitting: Anesthesiology

## 2021-01-02 ENCOUNTER — Other Ambulatory Visit: Payer: Self-pay | Admitting: Adult Health

## 2021-01-02 ENCOUNTER — Inpatient Hospital Stay (HOSPITAL_COMMUNITY): Payer: Medicaid Other

## 2021-01-02 ENCOUNTER — Other Ambulatory Visit: Payer: Self-pay

## 2021-01-02 ENCOUNTER — Encounter (HOSPITAL_COMMUNITY): Payer: Self-pay | Admitting: Physical Medicine & Rehabilitation

## 2021-01-02 ENCOUNTER — Inpatient Hospital Stay (HOSPITAL_COMMUNITY)
Admission: RE | Admit: 2021-01-02 | Discharge: 2021-01-10 | DRG: 057 | Disposition: A | Discharge status: Home Health | Payer: Medicaid Other | Source: Intra-hospital | Attending: Physical Medicine & Rehabilitation | Admitting: Physical Medicine & Rehabilitation

## 2021-01-02 ENCOUNTER — Encounter (HOSPITAL_COMMUNITY): Payer: Self-pay | Admitting: Neurology

## 2021-01-02 ENCOUNTER — Encounter (HOSPITAL_COMMUNITY)
Admission: EM | Disposition: A | Discharge status: Rehabilitation Facility | Payer: Self-pay | Source: Home / Self Care | Attending: Neurology

## 2021-01-02 DIAGNOSIS — I69354 Hemiplegia and hemiparesis following cerebral infarction affecting left non-dominant side: Principal | ICD-10-CM

## 2021-01-02 DIAGNOSIS — Z79899 Other long term (current) drug therapy: Secondary | ICD-10-CM

## 2021-01-02 DIAGNOSIS — Z6841 Body Mass Index (BMI) 40.0 and over, adult: Secondary | ICD-10-CM

## 2021-01-02 DIAGNOSIS — G4700 Insomnia, unspecified: Secondary | ICD-10-CM | POA: Diagnosis not present

## 2021-01-02 DIAGNOSIS — I639 Cerebral infarction, unspecified: Secondary | ICD-10-CM

## 2021-01-02 DIAGNOSIS — I69322 Dysarthria following cerebral infarction: Secondary | ICD-10-CM

## 2021-01-02 DIAGNOSIS — I6381 Other cerebral infarction due to occlusion or stenosis of small artery: Secondary | ICD-10-CM | POA: Diagnosis not present

## 2021-01-02 DIAGNOSIS — G8929 Other chronic pain: Secondary | ICD-10-CM | POA: Diagnosis not present

## 2021-01-02 DIAGNOSIS — E785 Hyperlipidemia, unspecified: Secondary | ICD-10-CM | POA: Diagnosis not present

## 2021-01-02 DIAGNOSIS — I169 Hypertensive crisis, unspecified: Secondary | ICD-10-CM | POA: Diagnosis not present

## 2021-01-02 DIAGNOSIS — I63311 Cerebral infarction due to thrombosis of right middle cerebral artery: Secondary | ICD-10-CM

## 2021-01-02 DIAGNOSIS — I10 Essential (primary) hypertension: Secondary | ICD-10-CM | POA: Diagnosis not present

## 2021-01-02 DIAGNOSIS — Z23 Encounter for immunization: Secondary | ICD-10-CM | POA: Diagnosis not present

## 2021-01-02 DIAGNOSIS — M549 Dorsalgia, unspecified: Secondary | ICD-10-CM | POA: Diagnosis present

## 2021-01-02 DIAGNOSIS — R001 Bradycardia, unspecified: Secondary | ICD-10-CM | POA: Diagnosis not present

## 2021-01-02 DIAGNOSIS — R29810 Facial weakness: Secondary | ICD-10-CM | POA: Diagnosis not present

## 2021-01-02 DIAGNOSIS — F17210 Nicotine dependence, cigarettes, uncomplicated: Secondary | ICD-10-CM | POA: Diagnosis not present

## 2021-01-02 DIAGNOSIS — Q211 Atrial septal defect: Secondary | ICD-10-CM

## 2021-01-02 DIAGNOSIS — Z20822 Contact with and (suspected) exposure to covid-19: Secondary | ICD-10-CM | POA: Diagnosis not present

## 2021-01-02 DIAGNOSIS — G8194 Hemiplegia, unspecified affecting left nondominant side: Secondary | ICD-10-CM | POA: Diagnosis not present

## 2021-01-02 DIAGNOSIS — I081 Rheumatic disorders of both mitral and tricuspid valves: Secondary | ICD-10-CM | POA: Diagnosis not present

## 2021-01-02 HISTORY — PX: TEE WITHOUT CARDIOVERSION: SHX5443

## 2021-01-02 HISTORY — PX: BUBBLE STUDY: SHX6837

## 2021-01-02 LAB — CBC
HCT: 48.1 % (ref 39.0–52.0)
HCT: 51.2 % (ref 39.0–52.0)
Hemoglobin: 15.8 g/dL (ref 13.0–17.0)
Hemoglobin: 16.8 g/dL (ref 13.0–17.0)
MCH: 28.8 pg (ref 26.0–34.0)
MCH: 29.1 pg (ref 26.0–34.0)
MCHC: 32.8 g/dL (ref 30.0–36.0)
MCHC: 32.8 g/dL (ref 30.0–36.0)
MCV: 87.6 fL (ref 80.0–100.0)
MCV: 88.6 fL (ref 80.0–100.0)
Platelets: 174 10*3/uL (ref 150–400)
Platelets: 177 10*3/uL (ref 150–400)
RBC: 5.49 MIL/uL (ref 4.22–5.81)
RBC: 5.78 MIL/uL (ref 4.22–5.81)
RDW: 13 % (ref 11.5–15.5)
RDW: 13 % (ref 11.5–15.5)
WBC: 10.4 10*3/uL (ref 4.0–10.5)
WBC: 10.9 10*3/uL — ABNORMAL HIGH (ref 4.0–10.5)
nRBC: 0 % (ref 0.0–0.2)
nRBC: 0 % (ref 0.0–0.2)

## 2021-01-02 LAB — BASIC METABOLIC PANEL
Anion gap: 9 (ref 5–15)
BUN: 10 mg/dL (ref 6–20)
CO2: 27 mmol/L (ref 22–32)
Calcium: 9.3 mg/dL (ref 8.9–10.3)
Chloride: 105 mmol/L (ref 98–111)
Creatinine, Ser: 1.03 mg/dL (ref 0.61–1.24)
GFR, Estimated: >60 mL/min (ref >60)
Glucose, Bld: 99 mg/dL (ref 70–99)
Potassium: 3.9 mmol/L (ref 3.5–5.1)
Sodium: 141 mmol/L (ref 135–145)

## 2021-01-02 LAB — CREATININE, SERUM
Creatinine, Ser: 1.03 mg/dL (ref 0.61–1.24)
GFR, Estimated: >60 mL/min (ref >60)

## 2021-01-02 SURGERY — ECHOCARDIOGRAM, TRANSESOPHAGEAL
Anesthesia: Monitor Anesthesia Care

## 2021-01-02 MED ORDER — ENOXAPARIN SODIUM 40 MG/0.4ML ~~LOC~~ SOLN: 40.0000 mg | Freq: Every day | SUBCUTANEOUS | Status: DC

## 2021-01-02 MED ORDER — PHENYLEPHRINE 40 MCG/ML (10ML) SYRINGE FOR IV PUSH (FOR BLOOD PRESSURE SUPPORT)
PREFILLED_SYRINGE | INTRAVENOUS | Status: DC | PRN
  Administered 2021-01-02 (×2): 40 ug via INTRAVENOUS

## 2021-01-02 MED ORDER — EXERCISE FOR HEART AND HEALTH BOOK
Freq: Once | Status: AC
  Filled 2021-01-02: qty 1

## 2021-01-02 MED ORDER — PANTOPRAZOLE SODIUM 40 MG PO TBEC
40.0000 mg | DELAYED_RELEASE_TABLET | Freq: Every day | ORAL | Status: DC
  Administered 2021-01-03 – 2021-01-10 (×8): 40 mg via ORAL
  Filled 2021-01-02 (×8): qty 1

## 2021-01-02 MED ORDER — METHOCARBAMOL 500 MG PO TABS
500.0000 mg | ORAL_TABLET | Freq: Three times a day (TID) | ORAL | Status: DC
  Administered 2021-01-02 – 2021-01-07 (×14): 500 mg via ORAL
  Filled 2021-01-02 (×15): qty 1

## 2021-01-02 MED ORDER — PROPOFOL 10 MG/ML IV BOLUS
INTRAVENOUS | Status: DC | PRN
  Administered 2021-01-02: 30 mg via INTRAVENOUS
  Administered 2021-01-02: 10 mg via INTRAVENOUS

## 2021-01-02 MED ORDER — ACETAMINOPHEN 160 MG/5ML PO SOLN: 650.0000 mg | ORAL | Status: DC | PRN

## 2021-01-02 MED ORDER — DEXMEDETOMIDINE (PRECEDEX) IN NS 20 MCG/5ML (4 MCG/ML) IV SYRINGE
PREFILLED_SYRINGE | INTRAVENOUS | Status: DC | PRN
  Administered 2021-01-02 (×5): 4 ug via INTRAVENOUS

## 2021-01-02 MED ORDER — SODIUM CHLORIDE 0.9 % IV SOLN
INTRAVENOUS | Status: DC
  Administered 2021-01-02: 500 mL via INTRAVENOUS

## 2021-01-02 MED ORDER — ACETAMINOPHEN 650 MG RE SUPP: 650.0000 mg | RECTAL | Status: DC | PRN

## 2021-01-02 MED ORDER — NICOTINE 14 MG/24HR TD PT24
14.0000 mg | MEDICATED_PATCH | Freq: Every day | TRANSDERMAL | Status: DC
  Administered 2021-01-06 – 2021-01-10 (×4): 14 mg via TRANSDERMAL
  Filled 2021-01-02 (×8): qty 1

## 2021-01-02 MED ORDER — ENOXAPARIN SODIUM 40 MG/0.4ML ~~LOC~~ SOLN
40.0000 mg | SUBCUTANEOUS | Status: DC
  Administered 2021-01-02 – 2021-01-09 (×8): 40 mg via SUBCUTANEOUS
  Filled 2021-01-02 (×8): qty 0.4

## 2021-01-02 MED ORDER — LIDOCAINE HCL (CARDIAC) PF 100 MG/5ML IV SOSY
PREFILLED_SYRINGE | INTRAVENOUS | Status: DC | PRN
  Administered 2021-01-02: 40 mg via INTRATRACHEAL

## 2021-01-02 MED ORDER — ASPIRIN 81 MG PO CHEW
81.0000 mg | CHEWABLE_TABLET | Freq: Every day | ORAL | Status: DC
  Administered 2021-01-03 – 2021-01-10 (×8): 81 mg via ORAL
  Filled 2021-01-02 (×8): qty 1

## 2021-01-02 MED ORDER — LIDOCAINE HCL URETHRAL/MUCOSAL 2 % EX GEL
CUTANEOUS | Status: DC | PRN
  Administered 2021-01-02: 1 via TOPICAL

## 2021-01-02 MED ORDER — DM-GUAIFENESIN ER 30-600 MG PO TB12: 1.0000 | ORAL_TABLET | Freq: Two times a day (BID) | ORAL | Status: DC

## 2021-01-02 MED ORDER — GLYCOPYRROLATE 0.2 MG/ML IJ SOLN
INTRAMUSCULAR | Status: DC | PRN
  Administered 2021-01-02 (×2): .1 mg via INTRAVENOUS

## 2021-01-02 MED ORDER — LIDOCAINE VISCOUS HCL 2 % MT SOLN
OROMUCOSAL | Status: AC
  Filled 2021-01-02: qty 15

## 2021-01-02 MED ORDER — CLOPIDOGREL BISULFATE 75 MG PO TABS
75.0000 mg | ORAL_TABLET | Freq: Every day | ORAL | Status: DC
  Administered 2021-01-03 – 2021-01-10 (×8): 75 mg via ORAL
  Filled 2021-01-02 (×8): qty 1

## 2021-01-02 MED ORDER — TRAZODONE HCL 50 MG PO TABS
50.0000 mg | ORAL_TABLET | Freq: Every day | ORAL | Status: DC
  Administered 2021-01-02 – 2021-01-04 (×3): 50 mg via ORAL
  Filled 2021-01-02 (×3): qty 1

## 2021-01-02 MED ORDER — DM-GUAIFENESIN ER 30-600 MG PO TB12
1.0000 | ORAL_TABLET | Freq: Two times a day (BID) | ORAL | Status: DC
  Administered 2021-01-02 – 2021-01-07 (×7): 1 via ORAL
  Filled 2021-01-02 (×15): qty 1

## 2021-01-02 MED ORDER — METHOCARBAMOL 500 MG PO TABS: 500.0000 mg | ORAL_TABLET | Freq: Three times a day (TID) | ORAL | Status: DC

## 2021-01-02 MED ORDER — AMLODIPINE BESYLATE 5 MG PO TABS
5.0000 mg | ORAL_TABLET | Freq: Every day | ORAL | Status: DC
  Administered 2021-01-03 – 2021-01-08 (×6): 5 mg via ORAL
  Filled 2021-01-02 (×6): qty 1

## 2021-01-02 MED ORDER — ACETAMINOPHEN 325 MG PO TABS
650.0000 mg | ORAL_TABLET | ORAL | Status: DC | PRN
  Administered 2021-01-08: 650 mg via ORAL
  Filled 2021-01-02: qty 2

## 2021-01-02 MED ORDER — SENNOSIDES-DOCUSATE SODIUM 8.6-50 MG PO TABS: 1.0000 | ORAL_TABLET | Freq: Every evening | ORAL | Status: DC | PRN

## 2021-01-02 MED ORDER — ATORVASTATIN CALCIUM 80 MG PO TABS
80.0000 mg | ORAL_TABLET | Freq: Every day | ORAL | Status: DC
  Administered 2021-01-03 – 2021-01-10 (×8): 80 mg via ORAL
  Filled 2021-01-02 (×8): qty 1

## 2021-01-02 MED ORDER — BLOOD PRESSURE CONTROL BOOK
Freq: Once | Status: AC
  Filled 2021-01-02: qty 1

## 2021-01-02 MED ORDER — ATORVASTATIN CALCIUM 80 MG PO TABS: 80.0000 mg | ORAL_TABLET | Freq: Every day | ORAL | Status: DC

## 2021-01-02 MED ORDER — AMLODIPINE BESYLATE 5 MG PO TABS: 5.0000 mg | ORAL_TABLET | Freq: Every day | ORAL | Status: DC

## 2021-01-02 MED ORDER — TRAZODONE HCL 50 MG PO TABS: 50.0000 mg | ORAL_TABLET | Freq: Every day | ORAL | Status: DC

## 2021-01-02 MED ORDER — LIDOCAINE 5 % EX PTCH: 1.0000 | MEDICATED_PATCH | CUTANEOUS | 0 refills | Status: DC

## 2021-01-02 MED ORDER — LIDOCAINE 5 % EX PTCH
1.0000 | MEDICATED_PATCH | CUTANEOUS | Status: DC
  Administered 2021-01-04 – 2021-01-09 (×6): 1 via TRANSDERMAL
  Filled 2021-01-02 (×11): qty 1

## 2021-01-02 MED ORDER — ASPIRIN 81 MG PO CHEW: 81.0000 mg | CHEWABLE_TABLET | Freq: Every day | ORAL | Status: AC

## 2021-01-02 MED ORDER — ACETAMINOPHEN 325 MG PO TABS: 650.0000 mg | ORAL_TABLET | ORAL | Status: DC | PRN

## 2021-01-02 MED ORDER — CLOPIDOGREL BISULFATE 75 MG PO TABS: 75.0000 mg | ORAL_TABLET | Freq: Every day | ORAL | Status: DC

## 2021-01-02 MED ORDER — PANTOPRAZOLE SODIUM 40 MG PO TBEC: 40.0000 mg | DELAYED_RELEASE_TABLET | Freq: Every day | ORAL | Status: DC

## 2021-01-02 MED ORDER — PROPOFOL 500 MG/50ML IV EMUL
INTRAVENOUS | Status: DC | PRN
  Administered 2021-01-02: 125 ug/kg/min via INTRAVENOUS

## 2021-01-02 MED ORDER — NICOTINE 14 MG/24HR TD PT24: 14.0000 mg | MEDICATED_PATCH | Freq: Every day | TRANSDERMAL | 0 refills | Status: DC

## 2021-01-02 NOTE — Interval H&P Note (Signed)
History and Physical Interval Note:  01/02/2021 7:56 AM  Aaron Gibson  has presented today for surgery, with the diagnosis of STROKE.  The various methods of treatment have been discussed with the patient and family. After consideration of risks, benefits and other options for treatment, the patient has consented to  Procedure(s): TRANSESOPHAGEAL ECHOCARDIOGRAM (TEE) (N/A) as a surgical intervention.  The patient's history has been reviewed, patient examined, no change in status, stable for surgery.  I have reviewed the patient's chart and labs.  Questions were answered to the patient's satisfaction.     Meriam Sprague

## 2021-01-02 NOTE — Anesthesia Preprocedure Evaluation (Signed)
Anesthesia Evaluation  Patient identified by MRN, date of birth, ID band Patient awake    Reviewed: Allergy & Precautions, H&P , NPO status , Patient's Chart, lab work & pertinent test results  Airway Mallampati: II   Neck ROM: full    Dental   Pulmonary Current Smoker,    breath sounds clear to auscultation       Cardiovascular hypertension,  Rhythm:regular Rate:Normal     Neuro/Psych CVA    GI/Hepatic   Endo/Other  Morbid obesity  Renal/GU      Musculoskeletal   Abdominal   Peds  Hematology   Anesthesia Other Findings   Reproductive/Obstetrics                             Anesthesia Physical Anesthesia Plan  ASA: II  Anesthesia Plan: MAC   Post-op Pain Management:    Induction: Intravenous  PONV Risk Score and Plan: 1 and Propofol infusion and Treatment may vary due to age or medical condition  Airway Management Planned: Nasal Cannula  Additional Equipment:   Intra-op Plan:   Post-operative Plan:   Informed Consent: I have reviewed the patients History and Physical, chart, labs and discussed the procedure including the risks, benefits and alternatives for the proposed anesthesia with the patient or authorized representative who has indicated his/her understanding and acceptance.       Plan Discussed with: CRNA, Anesthesiologist and Surgeon  Anesthesia Plan Comments:         Anesthesia Quick Evaluation

## 2021-01-02 NOTE — Discharge Summary (Addendum)
Stroke Discharge Summary  Patient ID: Aaron Gibson   MRN: [id number]      DOB: 16-Oct-1966  Date of Admission: 12/28/2020 Date of Discharge: 01/02/2021  Attending Physician:  Aaron Plan, MD, Stroke MD Consultant(s):  EP Cardiology  Patient's PCP:  Aaron Richters, PA-C  Discharge Diagnoses:  1. 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  2. Small PFO 3. Left sided weakness  4. Hypertension 5. Tobacco Use Disorder  6. OSA, newly diagnosed this admission 7. Hyperlipidemia   Principal Problem:   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  Active Problems:   Morbid obesity (HCC)   Hypertension   Chest pain, musculoskeletal   CVA (cerebral vascular accident) (HCC)  Medications to be continued on Rehab Allergies as of 01/02/2021   No Known Allergies     Medication List    TAKE these medications   acetaminophen 325 MG tablet Commonly known as: TYLENOL Take 2 tablets (650 mg total) by mouth every 4 (four) hours as needed for mild pain (or temp > 37.5 C (99.5 F)).   amLODipine 5 MG tablet Commonly known as: NORVASC Take 1 tablet (5 mg total) by mouth daily. Start taking on: January 03, 2021   aspirin 81 MG chewable tablet Chew 1 tablet (81 mg total) by mouth daily. Start taking on: January 03, 2021   atorvastatin 80 MG tablet Commonly known as: LIPITOR Take 1 tablet (80 mg total) by mouth daily. Start taking on: January 03, 2021   clopidogrel 75 MG tablet Commonly known as: PLAVIX Take 1 tablet (75 mg total) by mouth daily for 21 days.   dextromethorphan-guaiFENesin 30-600 MG 12hr tablet Commonly known as: MUCINEX DM Take 1 tablet by mouth 2 (two) times daily.   enoxaparin 40 MG/0.4ML injection Commonly known as: LOVENOX Inject 0.4 mLs (40 mg total) into the skin at bedtime.   lidocaine 5 % Commonly known as: LIDODERM Place 1 patch onto the skin daily. Remove & Discard patch within 12  hours or as directed by MD   methocarbamol 500 MG tablet Commonly known as: ROBAXIN Take 1 tablet (500 mg total) by mouth every 8 (eight) hours.   nicotine 14 mg/24hr patch Commonly known as: NICODERM CQ - dosed in mg/24 hours Place 1 patch (14 mg total) onto the skin daily.   pantoprazole 40 MG tablet Commonly known as: PROTONIX Take 1 tablet (40 mg total) by mouth daily. Start taking on: January 03, 2021   senna-docusate 8.6-50 MG tablet Commonly known as: Senokot-S Take 1 tablet by mouth at bedtime as needed for mild constipation or moderate constipation.   traZODone 50 MG tablet Commonly known as: DESYREL Take 1 tablet (50 mg total) by mouth at bedtime.      LABORATORY STUDIES CBC    Component Value Date/Time   WBC 10.9 (H) 01/02/2021 0023   RBC 5.78 01/02/2021 0023   HGB 16.8 01/02/2021 0023   HCT 51.2 01/02/2021 0023   PLT 174 01/02/2021 0023   MCV 88.6 01/02/2021 0023   MCH 29.1 01/02/2021 0023   MCHC 32.8 01/02/2021 0023   RDW 13.0 01/02/2021 0023   LYMPHSABS 4.4 (H) 12/28/2020 1035   MONOABS 1.0 12/28/2020 1035   EOSABS 0.2 12/28/2020 1035   BASOSABS 0.1 12/28/2020 1035   CMP    Component Value Date/Time   NA 141 01/02/2021 0023   K 3.9 01/02/2021 0023   CL 105 01/02/2021 0023  CO2 27 01/02/2021 0023   GLUCOSE 99 01/02/2021 0023   BUN 10 01/02/2021 0023   CREATININE 1.03 01/02/2021 0023   CALCIUM 9.3 01/02/2021 0023   PROT 6.6 12/28/2020 1035   ALBUMIN 3.3 (L) 12/28/2020 1035   AST 25 12/28/2020 1035   ALT 29 12/28/2020 1035   ALKPHOS 83 12/28/2020 1035   BILITOT 0.6 12/28/2020 1035   GFRNONAA >60 01/02/2021 0023   GFRAA >60 09/09/2016 1130   COAGS Lab Results  Component Value Date   INR 1.0 12/28/2020   Lipid Panel    Component Value Date/Time   CHOL 192 12/29/2020 0415   TRIG 78 12/29/2020 0415   HDL 38 (L) 12/29/2020 0415   CHOLHDL 5.1 12/29/2020 0415   VLDL 16 12/29/2020 0415   LDLCALC 138 (H) 12/29/2020 0415   HgbA1C  Lab  Results  Component Value Date   HGBA1C 6.5 (H) 12/29/2020   Urinalysis    Component Value Date/Time   COLORURINE AMBER (A) 09/09/2016 1123   APPEARANCEUR CLEAR 09/09/2016 1123   LABSPEC 1.026 09/09/2016 1123   PHURINE 5.5 09/09/2016 1123   GLUCOSEU NEGATIVE 09/09/2016 1123   HGBUR NEGATIVE 09/09/2016 1123   BILIRUBINUR SMALL (A) 09/09/2016 1123   KETONESUR NEGATIVE 09/09/2016 1123   PROTEINUR NEGATIVE 09/09/2016 1123   NITRITE NEGATIVE 09/09/2016 1123   LEUKOCYTESUR NEGATIVE 09/09/2016 1123   Urine Drug Screen No results found for: LABOPIA, COCAINSCRNUR, LABBENZ, AMPHETMU, THCU, LABBARB  Alcohol Level No results found for: Gibson   SIGNIFICANT DIAGNOSTIC STUDIES CT Head  No acute abnormality   CTA head/Neck  1. No large vessel occlusion or significant stenosis in the neck. 2. Subtle luminal irregularity throughout the bilateral MCA, ACA and PCA vascular tree may represent mild intracranial atherosclerotic disease without hemodynamically significant stenosis.  MRI Brain  1. Acute infarction within the posterior putamen, posterior body of the caudate and radiating white matter tracts on the right. No hemorrhage or mass effect. The remainder the brain appears normal without evidence of prior insult. 2. Mucosal inflammatory changes of the paranasal sinuses, which are small on a developmental basis.  EEG  Normal -no seizures   TEE Left ventricular ejection fraction, by estimation, is 60 to 65%.  A small patent foramen ovale was detected.   HISTORY OF PRESENT ILLNESS Per Dr. Tollie Gibson H&P: Briefly, Aaron Gibson is a 55 year old gentleman PMH obesity, HTN, tobacco abuse (current smoker), possible ADD, possible compliance issues who presented to the ED as a code stroke after awakening with left sided paralysis and slurred speech.   HOSPITAL COURSE Aaron Gibson is a 55 y.o. male who was evaluated as a Code Stroke in the ED. Hypertensive crisis noted with BP   220/110. TPA was administered. Head CT showed no acute intracranial abnormality. MR Brain revealed acute infarction within the posterior putamen, posterior body of the caudate and radiating white matter tracts on the right. CTA head/ neck showed no large vessel occlusion. EEG was normal without seizure activity. TEE showed a small patent foramen ovale and EF 60-75%.  Patient was not eligible for loop recorder due to lack of health insurance coverage. DAPT was provided throughout his stay. Covid vaccine was administered.  He remained hemodynamically and neurologically stable throughout his stay. Some improvement in left sided weakness was appreciated. Physical, speech, and occupational therapists were consulted and provided evaluation, treatment and discharge planning recommendations. He was recommended for inpatient rehabilitation which was arranged by the care management team. Other problems are listed in a  problem based format below.   #OSA Patient participated in clinical trial sleep study during admission and was found to have OSA.  Further treatment per Sleep Smart Study protocol  #Tobacco use disorder Smoking cessation counseling and written information provided. Nicotine patch was provided. Patient is willing to quit.   #Hyperlipidemia Atorvastatin 80mg  daily initiated and should be continued   #Hypertension  Stable on Norvasc 5mg  daily  #PFO  TEE showed small PFO  Pt ROPE score 3-4  Likely not cause of his current stroke  No further management needed at this time.   DISCHARGE EXAM Blood pressure (!) 148/85, pulse (!) 56, temperature 97.9 F (36.6 C), temperature source Oral, resp. rate 16, height 5\' 9"  (1.753 m), weight 124.3 kg, SpO2 98 %.  General - Well nourished, well developed, in no apparent distress.  Ophthalmologic - fundi not visualized due to noncooperation.  Cardiovascular - Regular rhythm and rate.  Mental Status -  Level of arousal and orientation to  time, place, and person were intact. Language including expression, naming, repetition, comprehension was assessed and found intact. Mild dysarthria Fund of Knowledge was assessed and was intact.  Cranial Nerves II - XII - II - Visual field intact OU. III, IV, VI - Extraocular movements intact. V - Facial sensation intact bilaterally. VII - left facial droop. VIII - Hearing & vestibular intact bilaterally. X - Palate elevates symmetrically. Mild dysarthria XI - Chin turning & shoulder shrug intact bilaterally. XII - Tongue protrusion intact.  Motor Strength - The patient's strength was normal in right upper and lower extremities, however left upper extremity proximal 3/5, distal 3+/5. Left lower extremity proximal 4+/5, distal 4/5.  Bulk was normal and fasciculations were absent.   Motor Tone - Muscle tone was assessed at the neck and appendages and was normal.  Reflexes - The patient's reflexes were symmetrical in all extremities and he had no pathological reflexes.  Sensory - Light touch, temperature/pinprick were assessed and were symmetrical.    Coordination - The patient had normal movements in the right hand with no ataxia or dysmetria. Left finger-to-nose dysmetria and slow, however not out of proportion to weakness. Tremor was absent.  Gait and Station - deferred.  Discharge Diet      Diet   Diet regular Room service appropriate? Yes; Fluid consistency: Thin   liquids  DISCHARGE Gibson  Disposition:  Transfer to [facility] for ongoing PT, OT and ST  Now on aspirin 81 mg daily and Plavix 75 DAPT for 3 weeks and then aspirin alone.  Recommend ongoing stroke risk factor control by Primary Care Physician at time of discharge from inpatient rehabilitation.  Follow-up PCP Saguier, , PA-C in 2 weeks following discharge from rehab.  Follow-up in [facility] in 4 weeks following discharge from rehab, office to  schedule an appointment.   40 minutes were spent preparing discharge.  , MD PhD Stroke Neurology 01/02/2021 5:12 PM

## 2021-01-02 NOTE — PMR Pre-admission (Signed)
PMR Admission Coordinator Pre-Admission Assessment  Patient: Aaron Gibson is an 55 y.o., male MRN: [id number] DOB: 1966-12-13 Height: '5\' 9"'  (175.3 cm) Weight: 124.3 kg  Insurance Information HMO:     PPO:      PCP:      IPA:      80/20:      OTHER:  PRIMARY: Pt is uninsured/self pay      Policy#:       Subscriber:  CM Name:       Phone#:      Fax#:  Pre-Cert#:       Employer:  Benefits:  Phone #:      Name:  Eff. Date:      Deduct:       Out of Pocket Max:       Life Max:  CIR:       SNF:  Outpatient:      Co-Pay:  Home Health:       Co-Pay:  DME:      Co-Pay:  Providers:   SECONDARY:       Policy#:      Phone#:   Financial Counselor: Toniann Ket      Phone#: [phone]  The "Data Collection Information Summary" for patients in Inpatient Rehabilitation Facilities with attached "Privacy Act [place] Records" was provided and verbally reviewed with: N/A  Emergency Contact Information Contact Information    Name Relation Home Work Mobile   [place] Mother [phone]     [place]   [phone]      Current Medical History  Patient Admitting Diagnosis: CVA  History of Present Illness: Pt is a 55 year old male with medical hx significant for: morbid obesity, HTN, tobacco abuse. Pt presented to hospital with left side weakness and slurred speech on 12/28/20.  Pt was administered tPA. MRI revealed acute infarct within posterior putamen, posterior body of caudate and radiating white matter tracts on the right. TEE performed on 01/02/21.  Therapy evaluations completed with recommendations for CIR d/t pt's deficits in mobility and ability to perform ADLs.  Complete NIHSS TOTAL: 4  Patient's medical record from [facility] has been reviewed by the rehabilitation admission coordinator and physician.  Past Medical History  Past Medical History:  Diagnosis Date  . Hypertension    untreated  . Morbid obesity ([place])     Family History    family history is not on file.  Prior Rehab/Hospitalizations Has the patient had prior rehab or hospitalizations prior to admission? No  Has the patient had major surgery during 100 days prior to admission? Yes   Current Medications  Current Facility-Administered Medications:  .  acetaminophen (TYLENOL) tablet 650 mg, 650 mg, Oral, Q4H PRN, 650 mg at 01/01/21 1131 **OR** acetaminophen (TYLENOL) 160 MG/5ML solution 650 mg, 650 mg, Per Tube, Q4H PRN **OR** acetaminophen (TYLENOL) suppository 650 mg, 650 mg, Rectal, Q4H PRN, Beulah Gandy A, NP .  amLODipine (NORVASC) tablet 5 mg, 5 mg, Oral, Daily, Beulah Gandy A, NP, 5 mg at 01/02/21 1141 .  aspirin chewable tablet 81 mg, 81 mg, Oral, Daily, Beulah Gandy A, NP, 81 mg at 01/02/21 1140 .  atorvastatin (LIPITOR) tablet 80 mg, 80 mg, Oral, Daily, Beulah Gandy A, NP, 80 mg at 01/02/21 1141 .  clopidogrel (PLAVIX) tablet 75 mg, 75 mg, Oral, Daily, Rosalin Hawking, MD, 75 mg at 01/02/21 1140 .  dextromethorphan-guaiFENesin (MUCINEX DM) 30-600 MG per 12 hr tablet 1 tablet, 1 tablet, Oral, BID, August Albino, NP,  1 tablet at 01/01/21 2139 .  enoxaparin (LOVENOX) injection 40 mg, 40 mg, Subcutaneous, QHS, Rosalin Hawking, MD, 40 mg at 01/01/21 2140 .  lidocaine (LIDODERM) 5 % 1 patch, 1 patch, Transdermal, Q24H, Beulah Gandy A, NP, 1 patch at 01/01/21 1948 .  methocarbamol (ROBAXIN) tablet 500 mg, 500 mg, Oral, Q8H, Rosalin Hawking, MD, 500 mg at 01/02/21 0315 .  nicotine (NICODERM CQ - dosed in mg/24 hours) patch 14 mg, 14 mg, Transdermal, Daily, Beulah Gandy A, NP, 14 mg at 12/31/20 0914 .  pantoprazole (PROTONIX) EC tablet 40 mg, 40 mg, Oral, Daily, Rosalin Hawking, MD, 40 mg at 01/02/21 1140 .  senna-docusate (Senokot-S) tablet 1 tablet, 1 tablet, Oral, QHS PRN, Beulah Gandy A, NP .  traZODone (DESYREL) tablet 50 mg, 50 mg, Oral, QHS, Beulah Gandy A, NP, 50 mg at 01/01/21 2140  Patients Current Diet:  Diet Order            Diet regular Room service  appropriate? Yes; Fluid consistency: Thin  Diet effective now                 Precautions / Restrictions Precautions Precautions: Fall Precaution Comments: L sided weakness, dizziness in standing Restrictions Weight Bearing Restrictions: No   Has the patient had 2 or more falls or a fall with injury in the past year? Yes  Prior Activity Level Community (5-7x/wk): driving, going to school  Prior Functional Level Self Care: Did the patient need help bathing, dressing, using the toilet or eating? Independent  Indoor Mobility: Did the patient need assistance with walking from room to room (with or without device)? Independent  Stairs: Did the patient need assistance with internal or external stairs (with or without device)? Independent  Functional Cognition: Did the patient need help planning regular tasks such as shopping or remembering to take medications? Independent  Home Assistive Devices / Equipment Home Assistive Devices/Equipment: None Home Equipment: None  Prior Device Use: Indicate devices/aids used by the patient prior to current illness, exacerbation or injury? None of the above  Current Functional Level Cognition  Overall Cognitive Status: Within Functional Limits for tasks assessed Orientation Level: Oriented X4 General Comments: pleasant and able to integrate cues for technique well through session    Extremity Assessment (includes Sensation/Coordination)  Upper Extremity Assessment: LUE deficits/detail LUE Deficits / Details: L sided weakness; apparent sensory motor deficits; able to lift off LUE from bed but requries assistance to move through full ROM in supien due to weakness; shoulder 2+/5; elbow 3/5; wrist/hand 3+/5. isolated movement patterns however unable to move into digit extension with wrist extension; poor in-hand manipulation LUE Sensation: decreased light touch LUE Coordination: decreased fine motor,decreased gross motor  Lower Extremity  Assessment: Defer to PT evaluation RLE Deficits / Details: WNL RLE Sensation: WNL RLE Coordination: WNL LLE Deficits / Details: ankle DF 4/5, quad 4-/5, hip flexor 2/5, seated hip ABD 3-/5 LLE Sensation: decreased proprioception LLE Coordination: decreased gross motor,decreased fine motor    ADLs  Overall ADL's : Needs assistance/impaired Eating/Feeding: Set up,Sitting Grooming: Minimal assistance Upper Body Bathing: Minimal assistance,Sitting Lower Body Bathing: Moderate assistance,Sit to/from stand Upper Body Dressing : Moderate assistance,Sitting Lower Body Dressing: Maximal assistance,Sit to/from stand Toilet Transfer: Moderate assistance,Squat-pivot Toileting- Clothing Manipulation and Hygiene: Maximal assistance Functional mobility during ADLs: Moderate assistance (Will need increased assistance for ambulation)    Mobility  Overal bed mobility: Needs Assistance Bed Mobility: Supine to Sit Supine to sit: Min assist General bed mobility comments: minA to assist  with trunk elevation from [facility] and steady in sitting. pt able to move BLE    Transfers  Overall transfer level: Needs assistance Equipment used: Rolling walker (2 wheeled) Transfers: Sit to/from [employer] Sit to Stand: Min assist Stand pivot transfers: Mod assist,Min assist Squat pivot transfers: Mod assist General transfer comment: pt requiring modA to power up and steady, able to complete x10 through session with improvement to minA with VC for hand positioning and minA to LUE for positioning    Ambulation / Gait / Stairs / Wheelchair Mobility  Ambulation/Gait Ambulation/Gait assistance: Mod assist,Min assist Gait Distance (Feet): 10 Feet (+ 49f + 261f+ 20 ft) Assistive device: Rolling walker (2 wheeled) Gait Pattern/deviations: Step-to pattern,Decreased step length - right,Decreased stance time - left,Decreased stride length,Decreased dorsiflexion - left,Decreased weight shift to  left,Shuffle,Trunk flexed,Narrow base of support General Gait Details: modA initially with verbal cues for LLE positioning, L grip on RW, and LLE clearance. Pt with improved fluidity with practice, continues to beenfit from cues and intermittent cues for posture/positioning in RW. poor proprioception/coordination with LLE Gait velocity: decreased Gait velocity interpretation: <1.31 ft/sec, indicative of household ambulator    Posture / Balance Dynamic Sitting Balance Sitting balance - Comments: able to static sit on flat bed without UE support Balance Overall balance assessment: Needs assistance Sitting-balance support: No upper extremity supported,Feet supported Sitting balance-Leahy Scale: Fair Sitting balance - Comments: able to static sit on flat bed without UE support Postural control: Left lateral lean Standing balance support: Bilateral upper extremity supported,During functional activity Standing balance-Leahy Scale: Poor Standing balance comment: reliant on BUE support and mod/minA from PT    Special needs/care consideration Skin ecchymosis: arm/right and Designated visitor JaMarye Roundsister   Previous Home Environment (from acute therapy documentation) Living Arrangements: Alone Available Help at Discharge: Family,Available 24 hours/day Type of Home: Other(Comment) (townhouse) Home Layout: Two level,1/2 bath on main level Alternate Level Stairs-Rails: Right Alternate Level Stairs-Number of Steps: flight Home Access: Level entry (has to step up to curb) Bathroom Shower/Tub: TuChiropodistStandard Bathroom Accessibility: Yes How Accessible: Accessible via walker [place]  Discharge Living Setting Plans for Discharge Living Setting: Patient's home Type of Home at Discharge: Other (Comment) (townhouse) Discharge Home Layout: Two level,1/2 bath on main level Alternate Level Stairs-Rails: Right Alternate Level Stairs-Number of Steps:  flight Discharge Home Access: Level entry (has to step up on curb) Discharge Bathroom Shower/Tub: Tub/shower unit Discharge Bathroom Toilet: Standard Discharge Bathroom Accessibility: Yes How Accessible: Accessible via walker Does the patient have any problems obtaining your medications?: Yes (Describe) (expensive)  Social/Family/Support Systems Anticipated Caregiver: JaMarye Roundsister Anticipated Caregiver's Contact Information: [phone] Availability: 24/7 (family is going to make arrangements so they take turns being available) Discharge Plan Discussed with Primary Caregiver: Yes Is Caregiver In Agreement with Plan?: Yes Does Caregiver/Family have Issues with Lodging/Transportation while Pt is in Rehab?: No  Goals Patient/Family Goal for Rehab: Mod I: PT/OT Expected length of stay: 10-14 days Pt/Family Agrees to Admission and willing to participate: Yes Program Orientation Provided & Reviewed with Pt/Caregiver Including Roles  & Responsibilities: Yes  Decrease burden of Care through IP rehab admission: NA  Possible need for SNF placement upon discharge: NA  Patient Condition: [facility], and patient and family member. I met with patient at the bedside for inpatient rehabilitation assessment.  Patient will benefit from ongoing PT and OT, can actively participate  in 3 hours of therapy a day 5 days of the week, and can make measurable gains during the admission.  Patient will also benefit from the coordinated team approach during an Inpatient Acute Rehabilitation admission.  The patient will receive intensive therapy as well as Rehabilitation physician, nursing, social worker, and care management interventions.  Due to safety, skin/wound care, disease management, medication administration and patient education the patient requires 24 hour a day rehabilitation nursing.  The patient is currently Min A-Mod A 10' with  mobility and Min A-Max A with basic ADLs.  Discharge setting and therapy post discharge at home with home health is anticipated.  Patient has agreed to participate in the Acute Inpatient Rehabilitation Program and will admit today.  Preadmission Screen Completed By:  Bethel Born, 01/02/2021 12:49 PM ______________________________________________________________________   Discussed status with Dr. Ranell Patrick on 01/02/21  at 12:49 PM and received approval for admission today.  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 Does the need for close, 24 hr/day Medical supervision in concert with the patient's rehab needs make it unreasonable for this patient to be served in a less intensive setting? Yes 2. Co-Morbidities requiring supervision/potential complications: HTN, morbid obesity (BMI 40.47), left sided weakness, disorientation, tobacco abuse 3. Due to bladder management, bowel management, safety, skin/wound care, disease management, medication administration, pain management and patient education, does the patient require 24 hr/day rehab nursing? Yes 4. Does the patient require coordinated care of a physician, rehab nurse, PT, OT to address physical and functional deficits in the context of the above medical diagnosis(es)? Yes Addressing deficits in the following areas: balance, endurance, locomotion, strength, transferring, bowel/bladder control, bathing, dressing, feeding, grooming, toileting and psychosocial support 5. Can the patient actively participate in an intensive therapy program of at least 3 hrs of therapy 5 days a week? Yes 6. The potential for patient to make measurable gains while on inpatient rehab is excellent 7. Anticipated functional outcomes upon discharge from inpatient rehab: modified independent PT, modified independent OT, independent and  modified independent SLP 8. Estimated rehab length of stay to reach the above functional goals is: 10-14 days 9. Anticipated discharge destination: Home 10. Overall Rehab/Functional Prognosis: excellent   MD Signature: Leeroy Cha, MD

## 2021-01-02 NOTE — Transfer of Care (Signed)
Immediate Anesthesia Transfer of Care Note  Patient: Aaron Gibson  Procedure(s) Performed: TRANSESOPHAGEAL ECHOCARDIOGRAM (TEE) (N/A ) BUBBLE STUDY  Patient Location: PACU  Anesthesia Type:MAC  Level of Consciousness: drowsy  Airway & Oxygen Therapy: Patient Spontanous Breathing  Post-op Assessment: Report given to RN and Post -op Vital signs reviewed and stable  Post vital signs: Reviewed and stable  Last Vitals:  Vitals Value Taken Time  BP    Temp    Pulse 90 01/02/21 0853  Resp 28 01/02/21 0853  SpO2 95 % 01/02/21 0853  Vitals shown include unvalidated device data.  Last Pain:  Vitals:   01/02/21 0754  TempSrc: Oral  PainSc: 5          Complications: No complications documented.

## 2021-01-02 NOTE — Progress Notes (Signed)
   01/02/21 0900  OT Visit Information  Last OT Received On 01/02/21  Assistance Needed +2  Reason Eval/Treat Not Completed Patient at procedure or test/ unavailable (Transesophageal Echocardiogram)  History of Present Illness 55yo male presenting as a code stroke with L sided weakness and slurred speech. TPA given, NIH score 10 at admit. MRI with acute infarcts in the posterior putamen, posterior body of the caudate, and right radiating white matter tracts. PMH morbid obesity, HTN   Plan to reattempt at a later time/date.  Raynald Kemp, OT Acute Rehabilitation Services Pager: [phone] Office: [phone]

## 2021-01-02 NOTE — Progress Notes (Signed)
Inpatient Rehabilitation  Patient information reviewed and entered into eRehab system by Breionna Punt M. Jaz Mallick, M.A., CCC/SLP, PPS Coordinator.  Information including medical coding, functional ability and quality indicators will be reviewed and updated through discharge.    

## 2021-01-02 NOTE — H&P (Signed)
Physical Medicine and Rehabilitation Admission H&P    Chief Complaint  Patient presents with  . Code Stroke  : HPI: Aaron CivilRandy Creegan is a 55 year old right-handed male with history of untreated hypertension, chronic back pain, morbid obesity with BMI of 40.47, tobacco abuse.  Per chart review patient lives alone.  Independent prior to admission.  Two-level home bed and bath upstairs.  He does have family in the area.  Presented 12/28/2020 with left-sided weakness and slurred speech.  Cranial CT scan negative for acute changes.  CT angiogram of head and neck with no occlusion or significant stenosis.  Patient did receive TPA.  MRI shows acute infarct within the posterior putamen, posterior body of the caudate and radiating white matter tracts on the right.  No hemorrhage or mass-effect.  EEG negative for seizure.  Echocardiogram with ejection fraction of 65 to 70% no wall motion abnormalities.  TEE showed ejection fraction of 60 to 65% no thrombus small PFO.  Admission chemistries were unremarkable except glucose 194 hemoglobin A1c 6.5 troponin negative.  Currently maintained on aspirin and Plavix for CVA prophylaxis x3 weeks and aspirin alone.  Subcutaneous Lovenox for DVT prophylaxis.  Due to patient's left sided weakness, slurred speech, and decrease in functional mobility he was admitted for a comprehensive rehab program. He currently has no complaints but he and his sister have a lot of questions about CIR.   Review of Systems  Constitutional: Negative for chills and fever.  HENT: Negative for hearing loss.   Eyes: Negative for blurred vision and double vision.  Respiratory: Negative for cough and shortness of breath.   Cardiovascular: Positive for leg swelling. Negative for chest pain and palpitations.  Gastrointestinal: Positive for constipation. Negative for heartburn, nausea and vomiting.  Genitourinary: Negative for dysuria, flank pain and hematuria.  Musculoskeletal: Positive for back  pain and joint pain.  Skin: Negative for rash.  Neurological: Positive for speech change and weakness.  All other systems reviewed and are negative.  Past Medical History:  Diagnosis Date  . Hypertension    untreated  . Morbid obesity (HCC)    Past Surgical History:  Procedure Laterality Date  . UMBILICAL HERNIA REPAIR N/A 03/15/2015   Procedure: UMBILICAL HERNIA REPAIR WITH MESH;  Surgeon: Abigail Miyamotoouglas Blackman, MD;  [facility];  Service: General;  Laterality: N/A;   History reviewed. No pertinent family history. Social History:  reports that he has been smoking cigarettes. He has a 14.50 pack-year smoking history. He has never used smokeless tobacco. He reports current alcohol use. He reports that he does not use drugs. Allergies: No Known Allergies No medications prior to admission.    Drug Regimen Review Drug regimen was reviewed and remains appropriate with no significant issues identified  Home: Home Living Family/patient expects to be discharged to:: Private residence Living Arrangements: Alone Available Help at Discharge: Family,Available 24 hours/day Type of Home: Other(Comment) (townhouse) Home Access: Level entry (has to step up to curb) Home Layout: Two level,1/2 bath on main level Alternate Level Stairs-Number of Steps: flight Alternate Level Stairs-Rails: Right Bathroom Shower/Tub: Engineer, manufacturing systemsTub/shower unit Bathroom Toilet: Standard Bathroom Accessibility: Yes Home Equipment: None   Functional History: Prior Function Level of Independence: Independent 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; did Advice workeraviation maintenance  Functional Status:  Mobility: Bed Mobility Overal bed mobility: Needs Assistance Bed Mobility: Supine to Sit Supine to sit: Min assist General bed mobility comments: minA to assist with [facility] and steady in sitting. pt able to move BLE Transfers  Overall transfer level: Needs assistance Equipment used: Rolling walker (2  wheeled) Transfers: Sit to/from [employer] Sit to Stand: Min assist Stand pivot transfers: Mod assist,Min assist Squat pivot transfers: Mod assist General transfer comment: pt requiring modA to power up and steady, able to complete x10 through session with improvement to minA with VC for hand positioning and minA to LUE for positioning Ambulation/Gait Ambulation/Gait assistance: Mod assist,Min assist Gait Distance (Feet): 10 Feet (+ 79ft + 8ft + 20 ft) Assistive device: Rolling walker (2 wheeled) Gait Pattern/deviations: Step-to pattern,Decreased step length - right,Decreased stance time - left,Decreased stride length,Decreased dorsiflexion - left,Decreased weight shift to left,Shuffle,Trunk flexed,Narrow base of support General Gait Details: modA initially with verbal cues for LLE positioning, L grip on RW, and LLE clearance. Pt with improved fluidity with practice, continues to beenfit from cues and intermittent cues for posture/positioning in RW. poor proprioception/coordination with LLE Gait velocity: decreased Gait velocity interpretation: <1.31 ft/sec, indicative of household ambulator    ADL: ADL Overall ADL's : Needs assistance/impaired Eating/Feeding: Set up,Sitting Grooming: Minimal assistance Upper Body Bathing: Minimal assistance,Sitting Lower Body Bathing: Moderate assistance,Sit to/from stand Upper Body Dressing : Moderate assistance,Sitting Lower Body Dressing: Maximal assistance,Sit to/from stand Toilet Transfer: Moderate assistance,Squat-pivot Toileting- Clothing Manipulation and Hygiene: Maximal assistance Functional mobility during ADLs: Moderate assistance (Will need increased assistance for ambulation)  Cognition: Cognition Overall Cognitive Status: Within Functional Limits for tasks assessed Orientation Level: Oriented X4 Cognition Arousal/Alertness: Awake/alert Behavior During Therapy: WFL for tasks assessed/performed Overall Cognitive  Status: Within Functional Limits for tasks assessed General Comments: pleasant and able to integrate cues for technique well through session  Physical Exam: Blood pressure (!) 152/90, pulse 72, temperature 97.9 F (36.6 C), temperature source Oral, resp. rate 16, height 5\' 9"  (1.753 m), weight 124.3 kg, SpO2 96 %. Physical Exam General: Alert and oriented x 2 (off on date), No apparent distress HEENT: Head is normocephalic, atraumatic, PERRLA, EOMI, sclera anicteric, oral mucosa pink and moist, dentition intact, ext ear canals clear,  Neck: Supple without JVD or lymphadenopathy Heart: Reg rate and rhythm. No murmurs rubs or gallops Chest: CTA bilaterally without wheezes, rales, or rhonchi; no distress Abdomen: Soft, non-tender, non-distended, bowel sounds positive. Extremities: No clubbing, cyanosis, or edema. Pulses are 2+ Skin: Clean and intact without signs of breakdown Neuro: Patient is alert in no acute distress and follows commands.  Mild dysarthria but intelligible. Left sided facial droop. Fair awareness of deficits. LUE 3/5 proximal and 4/5 distal. LLE 4/5 throughout. Sensation is intact Psych: Pt's affect is appropriate. Pt is cooperative   Results for orders placed or performed during the hospital encounter of 12/28/20 (from the past 48 hour(s))  Basic metabolic panel     Status: Abnormal   Collection Time: 01/01/21  3:44 AM  Result Value Ref Range   Sodium 139 135 - 145 mmol/L   Potassium 4.0 3.5 - 5.1 mmol/L   Chloride 107 98 - 111 mmol/L   CO2 21 (L) 22 - 32 mmol/L   Glucose, Bld 114 (H) 70 - 99 mg/dL    Comment: Glucose reference range applies only to samples taken after fasting for at least 8 hours.   BUN 9 6 - 20 mg/dL   Creatinine, Ser 03/01/21 0.61 - 1.24 mg/dL   Calcium 8.7 (L) 8.9 - 10.3 mg/dL   GFR, Estimated 8.92 >11 mL/min    Comment: (NOTE) Calculated using the CKD-EPI Creatinine Equation (2021)    Anion gap 11 5 - 15  Comment: Performed at [facility], [address] [place], [place] [address]  CBC     Status: Abnormal   Collection Time: 01/01/21  3:44 AM  Result Value Ref Range   WBC 10.8 (H) 4.0 - 10.5 K/uL   RBC 5.63 4.22 - 5.81 MIL/uL   Hemoglobin 16.2 13.0 - 17.0 g/dL   HCT 78.249.7 95.639.0 - 21.352.0 %   MCV 88.3 80.0 - 100.0 fL   MCH 28.8 26.0 - 34.0 pg   MCHC 32.6 30.0 - 36.0 g/dL   RDW 08.612.9 57.811.5 - 46.915.5 %   Platelets 162 150 - 400 K/uL   nRBC 0.0 0.0 - 0.2 %    Comment: Performed at [facility], [address] [place], [place] [address]  Basic metabolic panel     Status: None   Collection Time: 01/02/21 12:23 AM  Result Value Ref Range   Sodium 141 135 - 145 mmol/L   Potassium 3.9 3.5 - 5.1 mmol/L   Chloride 105 98 - 111 mmol/L   CO2 27 22 - 32 mmol/L   Glucose, Bld 99 70 - 99 mg/dL    Comment: Glucose reference range applies only to samples taken after fasting for at least 8 hours.   BUN 10 6 - 20 mg/dL   Creatinine, Ser 8.411.03 0.61 - 1.24 mg/dL   Calcium 9.3 8.9 - 32.410.3 mg/dL   GFR, Estimated >40>60 >10>60 mL/min    Comment: (NOTE) Calculated using the CKD-EPI Creatinine Equation (2021)    Anion gap 9 5 - 15    Comment: Performed at [facility], [address] [place], [place] [address]  CBC     Status: Abnormal   Collection Time: 01/02/21 12:23 AM  Result Value Ref Range   WBC 10.9 (H) 4.0 - 10.5 K/uL   RBC 5.78 4.22 - 5.81 MIL/uL   Hemoglobin 16.8 13.0 - 17.0 g/dL   HCT 66.451.2 40.339.0 - 47.452.0 %   MCV 88.6 80.0 - 100.0 fL   MCH 29.1 26.0 - 34.0 pg   MCHC 32.8 30.0 - 36.0 g/dL   RDW 25.913.0 56.311.5 - 87.515.5 %   Platelets 174 150 - 400 K/uL   nRBC 0.0 0.0 - 0.2 %    Comment: Performed at [facility], [address] [place], [place] [address]   ECHO TEE  Result Date: 01/02/2021    TRANSESOPHOGEAL ECHO REPORT   Patient Name:   Aaron CivilRANDY Kier Date of Exam: 01/02/2021 Medical Rec #:  [id number]     Height:       69.0 in Accession #:    [id number]    Weight:       274.1 lb Date of Birth:  1966/07/17     BSA:           2.362 m Patient Age:    54 years      BP:           134/73 mmHg Patient Gender: M             HR:           80 bpm. Exam Location:  Inpatient Procedure: Transesophageal Echo, Cardiac Doppler and Color Doppler Indications:    CVA  History:        Patient has prior history of Echocardiogram examinations, most                 recent 12/29/2020. Stroke; Risk Factors:Hypertension,  Dyslipidemia, Current Smoker, Diabetes and Obesity.  Sonographer:    Lavenia Atlas Referring Phys: [phone] DENISE A WOLFE PROCEDURE: The transesophogeal probe was passed without difficulty through the esophogus of the patient. Local oropharyngeal anesthetic was provided with viscous lidocaine. Sedation performed by different physician. The patient was monitored while under deep sedation. Anesthestetic sedation was provided intravenously by Anesthesiology: 319mg  of Propofol, 40mg  of Lidocaine. The patient developed no complications during the procedure. IMPRESSIONS  1. Left ventricular ejection fraction, by estimation, is 60 to 65%. The left ventricle has normal function. The left ventricle has no regional wall motion abnormalities. There is moderate concentric left ventricular hypertrophy.  2. Right ventricular systolic function is normal. The right ventricular size is normal.  3. No left atrial/left atrial appendage thrombus was detected.  4. The mitral valve is normal in structure. Trivial mitral valve regurgitation.  5. The aortic valve is tricuspid. Aortic valve regurgitation is not visualized. No aortic stenosis is present.  6. There is mild (Grade II) plaque.  7. A small patent foramen ovale is detected by color flow Doppler. 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 FINDINGS  Left Ventricle: Left ventricular ejection fraction, by estimation, is 60 to 65%. The left ventricle has normal function. The left  ventricle has no regional wall motion abnormalities. The left ventricular internal cavity size was normal in size. There is  moderate concentric left ventricular hypertrophy. Right Ventricle: The right ventricular size is normal. No increase in right ventricular wall thickness. Right ventricular systolic function is normal. Left Atrium: Left atrial size was normal in size. No left atrial/left atrial appendage thrombus was detected. Right Atrium: Right atrial size was normal in size. Pericardium: There is no evidence of pericardial effusion. Mitral Valve: The mitral valve is normal in structure. Trivial mitral valve regurgitation. Tricuspid Valve: The tricuspid valve is normal in structure. Tricuspid valve regurgitation is trivial. Aortic Valve: The aortic valve is tricuspid. Aortic valve regurgitation is not visualized. No aortic stenosis is present. Pulmonic Valve: The pulmonic valve was normal in structure. Pulmonic valve regurgitation is not visualized. Aorta: The aortic root and ascending aorta are structurally normal, with no evidence of dilitation. There is mild (Grade II) plaque. IAS/Shunts: Evidence of atrial level shunting detected by color flow Doppler. Agitated saline contrast was given intravenously to evaluate for intracardiac shunting. Agitated saline contrast bubble study was positive with shunting observed within 3-6 cardiac cycles suggestive of interatrial shunt. A small patent foramen ovale is detected. MD Electronically signed by MD Signature Date/Time: 01/02/2021/9:46:54 AM    Final        Medical Problem List and Plan: 1.  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  Patient did receive TPA.  TEE showed small PFO without thrombus  -patient may shower  -ELOS/Goals: modI in 10-14 days 2.  Antithrombotics: -DVT/anticoagulation: Lovenox 40 mg daily  -antiplatelet therapy: Aspirin 81 mg daily  and Plavix 75 mg daily x3 weeks then aspirin alone 3. Pain Management: Lidoderm patch as directed, Robaxin 500 mg every 8 hours. Pain is well controlled, if continues to be well controlled with therapy, consider making Robaxin PRN.  4. Mood: Trazodone 50 mg nightly  -antipsychotic agents: N/A 5. Neuropsych: This patient is capable of making decisions on his own behalf. 6. Skin/Wound Care: Routine skin checks 7. Fluids/Electrolytes/Nutrition: Routine in and outs with follow-up  chemistries 8.  Hypertension.  Labile, continue Norvasc 5 mg daily.  Monitor with increased mobility 9.  Hyperlipidemia.  Lipitor 10.  Tobacco abuse.  NicoDerm patch.  Counseling 11.  Morbid obesity.  BMI 40.47.  Dietary follow-up 12. Bradycardia: monitor HR TID.   I have personally performed a face to face diagnostic evaluation, including, but not limited to relevant history and physical exam findings, of this patient and developed relevant assessment and plan.  Additionally, I have reviewed and concur with the physician assistant's documentation above.  Sula Soda, MD  Mcarthur Rossetti Angiulli, PA-C 01/02/2021

## 2021-01-02 NOTE — Progress Notes (Signed)
Pt transported to Endo 

## 2021-01-02 NOTE — Procedures (Signed)
     Transesophageal Echocardiogram Note  Haven Foss [id number] 15-Jun-1966  Procedure: Transesophageal Echocardiogram Indications: Stroke  Procedure Details Consent: Obtained Time Out: Verified patient identification, verified procedure, site/side was marked, verified correct patient position, special equipment/implants available, Radiology Safety Procedures followed,  medications/allergies/relevent history reviewed, required imaging and test results available.  Performed  Medications: Propofol: 340mg  Lidocaine: 40mg  Precedex: 20mg   Managed by CV anesthesia  Left Ventrical:  LVEF 60-65%  Mitral Valve: Trace MR  Aortic Valve: Trileaflet, no AI or AS  Tricuspid Valve: Trace TR  Pulmonic Valve: No PI  Left Atrium/ Left atrial appendage: No evidence of LAA thrombus  Atrial septum: A small PFO was visualized with color doppler with L-->R shunting. Agitated bubble study was positive for PFO at rest and with valsalva  Aorta: Minimal plaquing   Complications: No apparent complications Patient did tolerate procedure well.  , MD 01/02/2021, 8:44 AM

## 2021-01-02 NOTE — H&P (Signed)
Physical Medicine and Rehabilitation Admission H&P   CC: Right basal ganglia CVA  HPI: Aaron Gibson is a 55 year old right-handed male with history of untreated hypertension, chronic back pain, morbid obesity with BMI of 40.47, tobacco abuse.  Per chart review patient lives alone.  Independent prior to admission.  Two-level home bed and bath upstairs.  He does have family in the area.  Presented 12/28/2020 with left-sided weakness and slurred speech.  Cranial CT scan negative for acute changes.  CT angiogram of head and neck with no occlusion or significant stenosis.  Patient did receive TPA.  MRI shows acute infarct within the posterior putamen, posterior body of the caudate and radiating white matter tracts on the right.  No hemorrhage or mass-effect.  EEG negative for seizure.  Echocardiogram with ejection fraction of 65 to 70% no wall motion abnormalities.  TEE showed ejection fraction of 60 to 65% no thrombus small PFO.  Admission chemistries were unremarkable except glucose 194 hemoglobin A1c 6.5 troponin negative.  Currently maintained on aspirin and Plavix for CVA prophylaxis x3 weeks and aspirin alone.  Subcutaneous Lovenox for DVT prophylaxis.  Due to patient's left sided weakness, slurred speech, and decrease in functional mobility he was admitted for a comprehensive rehab program. He currently has no complaints but he and his sister have a lot of questions about CIR.   Review of Systems  Constitutional: Negative for chills and fever.  HENT: Negative for hearing loss.   Eyes: Negative for blurred vision and double vision.  Respiratory: Negative for cough and shortness of breath.   Cardiovascular: Positive for leg swelling. Negative for chest pain and palpitations.  Gastrointestinal: Positive for constipation. Negative for heartburn, nausea and vomiting.  Genitourinary: Negative for dysuria, flank pain and hematuria.  Musculoskeletal: Positive for back pain and joint pain.  Skin:  Negative for rash.  Neurological: Positive for speech change and weakness.  All other systems reviewed and are negative.  Past Medical History:  Diagnosis Date  . Hypertension    untreated  . Morbid obesity (HCC)    Past Surgical History:  Procedure Laterality Date  . UMBILICAL HERNIA REPAIR N/A 03/15/2015   Procedure: UMBILICAL HERNIA REPAIR WITH MESH;  Surgeon: Abigail Miyamotoouglas Blackman, MD;  [facility];  Service: General;  Laterality: N/A;   No family history on file. Social History:  reports that he has been smoking cigarettes. He has a 14.50 pack-year smoking history. He has never used smokeless tobacco. He reports current alcohol use. He reports that he does not use drugs. Allergies: No Known Allergies Medications Prior to Admission  Medication Sig Dispense Refill  . acetaminophen (TYLENOL) 325 MG tablet Take 2 tablets (650 mg total) by mouth every 4 (four) hours as needed for mild pain (or temp > 37.5 C (99.5 F)).    Melene Muller. [START ON 01/03/2021] amLODipine (NORVASC) 5 MG tablet Take 1 tablet (5 mg total) by mouth daily.    Melene Muller. [START ON 01/03/2021] aspirin 81 MG chewable tablet Chew 1 tablet (81 mg total) by mouth daily.    Melene Muller. [START ON 01/03/2021] atorvastatin (LIPITOR) 80 MG tablet Take 1 tablet (80 mg total) by mouth daily.    . clopidogrel (PLAVIX) 75 MG tablet Take 1 tablet (75 mg total) by mouth daily for 21 days.    Marland Kitchen. dextromethorphan-guaiFENesin (MUCINEX DM) 30-600 MG 12hr tablet Take 1 tablet by mouth 2 (two) times daily.    Marland Kitchen. enoxaparin (LOVENOX) 40 MG/0.4ML injection Inject 0.4 mLs (40 mg total) into  the skin at bedtime. 0 mL   . lidocaine (LIDODERM) 5 % Place 1 patch onto the skin daily. Remove & Discard patch within 12 hours or as directed by MD 30 patch 0  . methocarbamol (ROBAXIN) 500 MG tablet Take 1 tablet (500 mg total) by mouth every 8 (eight) hours.    . nicotine (NICODERM CQ - DOSED IN MG/24 HOURS) 14 mg/24hr patch Place 1 patch (14 mg total) onto the skin daily. 28 patch 0   . [START ON 01/03/2021] pantoprazole (PROTONIX) 40 MG tablet Take 1 tablet (40 mg total) by mouth daily.    Marland Kitchen senna-docusate (SENOKOT-S) 8.6-50 MG tablet Take 1 tablet by mouth at bedtime as needed for mild constipation or moderate constipation.    . traZODone (DESYREL) 50 MG tablet Take 1 tablet (50 mg total) by mouth at bedtime.      Drug Regimen Review Drug regimen was reviewed and remains appropriate with no significant issues identified  Home: Home Living Family/patient expects to be discharged to:: Private residence Living Arrangements: Alone Available Help at Discharge: Family,Available 24 hours/day Type of Home: Other(Comment) (townhouse) Home Access: Level entry (has to step up to curb) Home Layout: Two level,1/2 bath on main level Alternate Level Stairs-Number of Steps: flight Alternate Level Stairs-Rails: Right Bathroom Shower/Tub: Engineer, manufacturing systems: Standard Bathroom Accessibility: Yes Home Equipment: None   Functional History: Prior Function Level of Independence: Independent 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; did Advice worker Status:  Mobility: Bed Mobility Overal bed mobility: Needs Assistance Bed Mobility: Supine to Sit Supine to sit: Min assist General bed mobility comments: minA to assist with trunk elevation from [facility] and steady in sitting. pt able to move BLE Transfers Overall transfer level: Needs assistance Equipment used: Rolling walker (2 wheeled) Transfers: Sit to/from [employer] Sit to Stand: Min assist Stand pivot transfers: Mod assist,Min assist Squat pivot transfers: Mod assist General transfer comment: pt requiring modA to power up and steady, able to complete x10 through session with improvement to minA with VC for hand positioning and minA to LUE for positioning Ambulation/Gait Ambulation/Gait assistance: Mod assist,Min assist Gait Distance (Feet): 10 Feet (+ 31ft + 17ft + 20 ft) Assistive  device: Rolling walker (2 wheeled) Gait Pattern/deviations: Step-to pattern,Decreased step length - right,Decreased stance time - left,Decreased stride length,Decreased dorsiflexion - left,Decreased weight shift to left,Shuffle,Trunk flexed,Narrow base of support General Gait Details: modA initially with verbal cues for LLE positioning, L grip on RW, and LLE clearance. Pt with improved fluidity with practice, continues to beenfit from cues and intermittent cues for posture/positioning in RW. poor proprioception/coordination with LLE Gait velocity: decreased Gait velocity interpretation: <1.31 ft/sec, indicative of household ambulator  ADL: ADL Overall ADL's : Needs assistance/impaired Eating/Feeding: Set up,Sitting Grooming: Minimal assistance Upper Body Bathing: Minimal assistance,Sitting Lower Body Bathing: Moderate assistance,Sit to/from stand Upper Body Dressing : Moderate assistance,Sitting Lower Body Dressing: Maximal assistance,Sit to/from stand Toilet Transfer: Moderate assistance,Squat-pivot Toileting- Clothing Manipulation and Hygiene: Maximal assistance Functional mobility during ADLs: Moderate assistance (Will need increased assistance for ambulation)  Cognition: Cognition Overall Cognitive Status: Within Functional Limits for tasks assessed Orientation Level: Oriented X4 Cognition Arousal/Alertness: Awake/alert Behavior During Therapy: WFL for tasks assessed/performed Overall Cognitive Status: Within Functional Limits for tasks assessed General Comments: pleasant and able to integrate cues for technique well through session  There were no vitals taken for this visit. Physical Exam General: Alert and oriented x 2 (off on date), No apparent distress HEENT: Head is normocephalic, atraumatic, PERRLA,  EOMI, sclera anicteric, oral mucosa pink and moist, dentition intact, ext ear canals clear,  Neck: Supple without JVD or lymphadenopathy Heart: Reg rate and rhythm. No  murmurs rubs or gallops Chest: CTA bilaterally without wheezes, rales, or rhonchi; no distress Abdomen: Soft, non-tender, non-distended, bowel sounds positive. Extremities: No clubbing, cyanosis, or edema. Pulses are 2+ Skin: Clean and intact without signs of breakdown Neuro: Patient is alert in no acute distress and follows commands.  Mild dysarthria but intelligible. Left sided facial droop. Fair awareness of deficits. LUE 3/5 proximal and 4/5 distal. LLE 4/5 throughout. Sensation is intact Psych: Pt's affect is appropriate. Pt is cooperative   Results for orders placed or performed during the hospital encounter of 12/28/20 (from the past 48 hour(s))  Basic metabolic panel     Status: Abnormal   Collection Time: 01/01/21  3:44 AM  Result Value Ref Range   Sodium 139 135 - 145 mmol/L   Potassium 4.0 3.5 - 5.1 mmol/L   Chloride 107 98 - 111 mmol/L   CO2 21 (L) 22 - 32 mmol/L   Glucose, Bld 114 (H) 70 - 99 mg/dL    Comment: Glucose reference range applies only to samples taken after fasting for at least 8 hours.   BUN 9 6 - 20 mg/dL   Creatinine, Ser 0.981.02 0.61 - 1.24 mg/dL   Calcium 8.7 (L) 8.9 - 10.3 mg/dL   GFR, Estimated >11>60 >91>60 mL/min    Comment: (NOTE) Calculated using the CKD-EPI Creatinine Equation (2021)    Anion gap 11 5 - 15    Comment: Performed at [facility], [address] [place], [place] [address]  CBC     Status: Abnormal   Collection Time: 01/01/21  3:44 AM  Result Value Ref Range   WBC 10.8 (H) 4.0 - 10.5 K/uL   RBC 5.63 4.22 - 5.81 MIL/uL   Hemoglobin 16.2 13.0 - 17.0 g/dL   HCT 56.249.7 13.039.0 - 86.552.0 %   MCV 88.3 80.0 - 100.0 fL   MCH 28.8 26.0 - 34.0 pg   MCHC 32.6 30.0 - 36.0 g/dL   RDW 78.412.9 69.611.5 - 29.515.5 %   Platelets 162 150 - 400 K/uL   nRBC 0.0 0.0 - 0.2 %    Comment: Performed at [facility], [address] [place], [place] [address]  Basic metabolic panel     Status: None   Collection Time: 01/02/21 12:23 AM  Result Value Ref Range    Sodium 141 135 - 145 mmol/L   Potassium 3.9 3.5 - 5.1 mmol/L   Chloride 105 98 - 111 mmol/L   CO2 27 22 - 32 mmol/L   Glucose, Bld 99 70 - 99 mg/dL    Comment: Glucose reference range applies only to samples taken after fasting for at least 8 hours.   BUN 10 6 - 20 mg/dL   Creatinine, Ser 2.441.03 0.61 - 1.24 mg/dL   Calcium 9.3 8.9 - 01.010.3 mg/dL   GFR, Estimated >27>60 >25>60 mL/min    Comment: (NOTE) Calculated using the CKD-EPI Creatinine Equation (2021)    Anion gap 9 5 - 15    Comment: Performed at [facility], [address] [place], [place] [address]  CBC     Status: Abnormal   Collection Time: 01/02/21 12:23 AM  Result Value Ref Range   WBC 10.9 (H) 4.0 - 10.5 K/uL   RBC 5.78 4.22 - 5.81 MIL/uL   Hemoglobin 16.8 13.0 -  17.0 g/dL   HCT 95.1 88.4 - 16.6 %   MCV 88.6 80.0 - 100.0 fL   MCH 29.1 26.0 - 34.0 pg   MCHC 32.8 30.0 - 36.0 g/dL   RDW 06.3 01.6 - 01.0 %   Platelets 174 150 - 400 K/uL   nRBC 0.0 0.0 - 0.2 %    Comment: Performed at [facility], [address] [place], [place] [address]   ECHO TEE  Result Date: 01/02/2021    TRANSESOPHOGEAL ECHO REPORT   Patient Name:   Aaron Gibson Date of Exam: 01/02/2021 Medical Rec #:  [id number]     Height:       69.0 in Accession #:    [id number]    Weight:       274.1 lb Date of Birth:  1966/11/02     BSA:          2.362 m Patient Age:    54 years      BP:           134/73 mmHg Patient Gender: M             HR:           80 bpm. Exam Location:  Inpatient Procedure: Transesophageal Echo, Cardiac Doppler and Color Doppler Indications:    CVA  History:        Patient has prior history of Echocardiogram examinations, most                 recent 12/29/2020. Stroke; Risk Factors:Hypertension,                 Dyslipidemia, Current Smoker, Diabetes and Obesity.  Sonographer:    Lavenia Atlas Referring Phys: [phone] DENISE A WOLFE PROCEDURE: The transesophogeal probe was passed without difficulty through the esophogus of the patient.  Local oropharyngeal anesthetic was provided with viscous lidocaine. Sedation performed by different physician. The patient was monitored while under deep sedation. Anesthestetic sedation was provided intravenously by Anesthesiology: 319mg  of Propofol, 40mg  of Lidocaine. The patient developed no complications during the procedure. IMPRESSIONS  1. Left ventricular ejection fraction, by estimation, is 60 to 65%. The left ventricle has normal function. The left ventricle has no regional wall motion abnormalities. There is moderate concentric left ventricular hypertrophy.  2. Right ventricular systolic function is normal. The right ventricular size is normal.  3. No left atrial/left atrial appendage thrombus was detected.  4. The mitral valve is normal in structure. Trivial mitral valve regurgitation.  5. The aortic valve is tricuspid. Aortic valve regurgitation is not visualized. No aortic stenosis is present.  6. There is mild (Grade II) plaque.  7. A small patent foramen ovale is detected by color flow Doppler. 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 FINDINGS  Left Ventricle: Left ventricular ejection fraction, by estimation, is 60 to 65%. The left ventricle has normal function. The left ventricle has no regional wall motion abnormalities. The left ventricular internal cavity size was normal in size. There is  moderate concentric left ventricular hypertrophy. Right Ventricle: The right ventricular size is normal. No increase in right ventricular wall thickness. Right ventricular systolic function is normal. Left Atrium: Left atrial size was normal in size. No left atrial/left atrial appendage thrombus was detected. Right Atrium: Right atrial size was normal in size. Pericardium: There is no evidence of pericardial effusion. Mitral Valve: The mitral valve is  normal in structure. Trivial mitral valve  regurgitation. Tricuspid Valve: The tricuspid valve is normal in structure. Tricuspid valve regurgitation is trivial. Aortic Valve: The aortic valve is tricuspid. Aortic valve regurgitation is not visualized. No aortic stenosis is present. Pulmonic Valve: The pulmonic valve was normal in structure. Pulmonic valve regurgitation is not visualized. Aorta: The aortic root and ascending aorta are structurally normal, with no evidence of dilitation. There is mild (Grade II) plaque. IAS/Shunts: Evidence of atrial level shunting detected by color flow Doppler. Agitated saline contrast was given intravenously to evaluate for intracardiac shunting. Agitated saline contrast bubble study was positive with shunting observed within 3-6 cardiac cycles suggestive of interatrial shunt. A small patent foramen ovale is detected. Laurance Flatten MD Electronically signed by Laurance Flatten MD Signature Date/Time: 01/02/2021/9:46:54 AM    Final        Medical Problem List and Plan: 1.  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  Patient did receive TPA.  TEE showed small PFO without thrombus  -patient may shower  -ELOS/Goals: modI in 10-14 days 2.  Antithrombotics: -DVT/anticoagulation: Lovenox 40 mg daily  -antiplatelet therapy: Aspirin 81 mg daily and Plavix 75 mg daily x3 weeks then aspirin alone 3. Pain Management: Lidoderm patch as directed, Robaxin 500 mg every 8 hours. Pain is well controlled, if continues to be well controlled with therapy, consider making Robaxin PRN.  4. Mood: Trazodone 50 mg nightly  -antipsychotic agents: N/A 5. Neuropsych: This patient is capable of making decisions on his own behalf. 6. Skin/Wound Care: Routine skin checks 7. Fluids/Electrolytes/Nutrition: Routine in and outs with follow-up chemistries 8.  Hypertension.  Labile, continue Norvasc 5 mg daily.  Monitor with increased mobility 9.  Hyperlipidemia.  Lipitor 10.   Tobacco abuse.  NicoDerm patch.  Counseling 11.  Morbid obesity.  BMI 40.47.  Dietary follow-up 12. Bradycardia: monitor HR TID.   I have personally performed a face to face diagnostic evaluation, including, but not limited to relevant history and physical exam findings, of this patient and developed relevant assessment and plan.  Additionally, I have reviewed and concur with the physician assistant's documentation above.  Mcarthur Rossetti Angiulli, PA-C 01/02/2021  Horton Chin, MD 01/02/2021

## 2021-01-02 NOTE — Progress Notes (Signed)
*  PRELIMINARY RESULTS* Echocardiogram TEE has been performed.  Pieter Partridge 01/02/2021, 9:11 AM

## 2021-01-02 NOTE — Progress Notes (Signed)
Signed         Show:Clear all '[x]' Manual'[x]' Template'[]' Copied  Added by: '[x]' Lind Covert, Janeth Terry P, CCC-SLP'[x]' Raulkar, Clide Deutscher, MD   '[]' Hover for details  PMR Admission Coordinator Pre-Admission Assessment   Patient: Aaron Gibson is an 55 y.o., male MRN: [id number] DOB: 1966/02/11 Height: '5\' 9"'  (175.3 cm) Weight: 124.3 kg   Insurance Information HMO:     PPO:      PCP:      IPA:      80/20:      OTHER:  PRIMARY: Pt is uninsured/self pay      Policy#:       Subscriber:  CM Name:       Phone#:      Fax#:  Pre-Cert#:       Employer:  Benefits:  Phone #:      Name:  Eff. Date:      Deduct:       Out of Pocket Max:       Life Max:  CIR:       SNF:  Outpatient:      Co-Pay:  Home Health:       Co-Pay:  DME:      Co-Pay:  Providers:   SECONDARY:       Policy#:      Phone#:    Financial Counselor: Toniann Ket      Phone#: [phone]   The "Data Collection Information Summary" for patients in Inpatient Rehabilitation Facilities with attached "Privacy Act [place] Records" was provided and verbally reviewed with: N/A   Emergency Contact Information         Contact Information     Name Relation Home Work Mobile    [place] Mother [phone]        [place]     [phone]         Current Medical History  Patient Admitting Diagnosis: CVA   History of Present Illness: Pt is a 55 year old male with medical hx significant for: morbid obesity, HTN, tobacco abuse. Pt presented to hospital with left side weakness and slurred speech on 12/28/20.  Pt was administered tPA. MRI revealed acute infarct within posterior putamen, posterior body of caudate and radiating white matter tracts on the right. TEE performed on 01/02/21.  Therapy evaluations completed with recommendations for CIR d/t pt's deficits in mobility and ability to perform ADLs.   Complete NIHSS TOTAL: 4   Patient's medical record from [facility] has been reviewed by the  rehabilitation admission coordinator and physician.   Past Medical History      Past Medical History:  Diagnosis Date  . Hypertension      untreated  . Morbid obesity ([place])        Family History   family history is not on file.   Prior Rehab/Hospitalizations Has the patient had prior rehab or hospitalizations prior to admission? No   Has the patient had major surgery during 100 days prior to admission? Yes              Current Medications   Current Facility-Administered Medications:  .  acetaminophen (TYLENOL) tablet 650 mg, 650 mg, Oral, Q4H PRN, 650 mg at 01/01/21 1131 **OR** acetaminophen (TYLENOL) 160 MG/5ML solution 650 mg, 650 mg, Per Tube, Q4H PRN **OR** acetaminophen (TYLENOL) suppository 650 mg, 650 mg, Rectal, Q4H PRN, Beulah Gandy A, NP .  amLODipine (NORVASC) tablet 5 mg, 5 mg, Oral, Daily, Beulah Gandy A, NP, 5 mg at 01/02/21 1141 .  aspirin chewable tablet 81 mg, 81 mg, Oral, Daily, Beulah Gandy A, NP, 81 mg at 01/02/21 1140 .  atorvastatin (LIPITOR) tablet 80 mg, 80 mg, Oral, Daily, Beulah Gandy A, NP, 80 mg at 01/02/21 1141 .  clopidogrel (PLAVIX) tablet 75 mg, 75 mg, Oral, Daily, Rosalin Hawking, MD, 75 mg at 01/02/21 1140 .  dextromethorphan-guaiFENesin (MUCINEX DM) 30-600 MG per 12 hr tablet 1 tablet, 1 tablet, Oral, BID, Beulah Gandy A, NP, 1 tablet at 01/01/21 2139 .  enoxaparin (LOVENOX) injection 40 mg, 40 mg, Subcutaneous, QHS, Rosalin Hawking, MD, 40 mg at 01/01/21 2140 .  lidocaine (LIDODERM) 5 % 1 patch, 1 patch, Transdermal, Q24H, Beulah Gandy A, NP, 1 patch at 01/01/21 1948 .  methocarbamol (ROBAXIN) tablet 500 mg, 500 mg, Oral, Q8H, Rosalin Hawking, MD, 500 mg at 01/02/21 6314 .  nicotine (NICODERM CQ - dosed in mg/24 hours) patch 14 mg, 14 mg, Transdermal, Daily, Beulah Gandy A, NP, 14 mg at 12/31/20 0914 .  pantoprazole (PROTONIX) EC tablet 40 mg, 40 mg, Oral, Daily, Rosalin Hawking, MD, 40 mg at 01/02/21 1140 .  senna-docusate (Senokot-S) tablet 1 tablet, 1  tablet, Oral, QHS PRN, Beulah Gandy A, NP .  traZODone (DESYREL) tablet 50 mg, 50 mg, Oral, QHS, Beulah Gandy A, NP, 50 mg at 01/01/21 2140   Patients Current Diet:     Diet Order                      Diet regular Room service appropriate? Yes; Fluid consistency: Thin  Diet effective now                      Precautions / Restrictions Precautions Precautions: Fall Precaution Comments: L sided weakness, dizziness in standing Restrictions Weight Bearing Restrictions: No    Has the patient had 2 or more falls or a fall with injury in the past year? Yes   Prior Activity Level Community (5-7x/wk): driving, going to school   Prior Functional Level Self Care: Did the patient need help bathing, dressing, using the toilet or eating? Independent   Indoor Mobility: Did the patient need assistance with walking from room to room (with or without device)? Independent   Stairs: Did the patient need assistance with internal or external stairs (with or without device)? Independent   Functional Cognition: Did the patient need help planning regular tasks such as shopping or remembering to take medications? Independent   Home Assistive Devices / Equipment Home Assistive Devices/Equipment: None Home Equipment: None   Prior Device Use: Indicate devices/aids used by the patient prior to current illness, exacerbation or injury? None of the above   Current Functional Level Cognition   Overall Cognitive Status: Within Functional Limits for tasks assessed Orientation Level: Oriented X4 General Comments: pleasant and able to integrate cues for technique well through session    Extremity Assessment (includes Sensation/Coordination)   Upper Extremity Assessment: LUE deficits/detail LUE Deficits / Details: L sided weakness; apparent sensory motor deficits; able to lift off LUE from bed but requries assistance to move through full ROM in supien due to weakness; shoulder 2+/5; elbow 3/5;  wrist/hand 3+/5. isolated movement patterns however unable to move into digit extension with wrist extension; poor in-hand manipulation LUE Sensation: decreased light touch LUE Coordination: decreased fine motor,decreased gross motor  Lower Extremity Assessment: Defer to PT evaluation RLE Deficits / Details: WNL RLE Sensation: WNL RLE Coordination: WNL LLE Deficits / Details: ankle DF 4/5, quad 4-/5, hip  flexor 2/5, seated hip ABD 3-/5 LLE Sensation: decreased proprioception LLE Coordination: decreased gross motor,decreased fine motor     ADLs   Overall ADL's : Needs assistance/impaired Eating/Feeding: Set up,Sitting Grooming: Minimal assistance Upper Body Bathing: Minimal assistance,Sitting Lower Body Bathing: Moderate assistance,Sit to/from stand Upper Body Dressing : Moderate assistance,Sitting Lower Body Dressing: Maximal assistance,Sit to/from stand Toilet Transfer: Moderate assistance,Squat-pivot Toileting- Clothing Manipulation and Hygiene: Maximal assistance Functional mobility during ADLs: Moderate assistance (Will need increased assistance for ambulation)     Mobility   Overal bed mobility: Needs Assistance Bed Mobility: Supine to Sit Supine to sit: Min assist General bed mobility comments: minA to assist with trunk elevation from [facility] and steady in sitting. pt able to move BLE     Transfers   Overall transfer level: Needs assistance Equipment used: Rolling walker (2 wheeled) Transfers: Sit to/from [employer] Sit to Stand: Min assist Stand pivot transfers: Mod assist,Min assist Squat pivot transfers: Mod assist General transfer comment: pt requiring modA to power up and steady, able to complete x10 through session with improvement to minA with VC for hand positioning and minA to LUE for positioning     Ambulation / Gait / Stairs / Wheelchair Mobility   Ambulation/Gait Ambulation/Gait assistance: Mod assist,Min assist Gait Distance (Feet): 10 Feet (+  28f + 243f+ 20 ft) Assistive device: Rolling walker (2 wheeled) Gait Pattern/deviations: Step-to pattern,Decreased step length - right,Decreased stance time - left,Decreased stride length,Decreased dorsiflexion - left,Decreased weight shift to left,Shuffle,Trunk flexed,Narrow base of support General Gait Details: modA initially with verbal cues for LLE positioning, L grip on RW, and LLE clearance. Pt with improved fluidity with practice, continues to beenfit from cues and intermittent cues for posture/positioning in RW. poor proprioception/coordination with LLE Gait velocity: decreased Gait velocity interpretation: <1.31 ft/sec, indicative of household ambulator     Posture / Balance Dynamic Sitting Balance Sitting balance - Comments: able to static sit on flat bed without UE support Balance Overall balance assessment: Needs assistance Sitting-balance support: No upper extremity supported,Feet supported Sitting balance-Leahy Scale: Fair Sitting balance - Comments: able to static sit on flat bed without UE support Postural control: Left lateral lean Standing balance support: Bilateral upper extremity supported,During functional activity Standing balance-Leahy Scale: Poor Standing balance comment: reliant on BUE support and mod/minA from PT     Special needs/care consideration Skin ecchymosis: arm/right and Designated visitor JaMarye Roundsister    Previous Home Environment (from acute therapy documentation) Living Arrangements: Alone Available Help at Discharge: Family,Available 24 hours/day Type of Home: Other(Comment) (townhouse) Home Layout: Two level,1/2 bath on main level Alternate Level Stairs-Rails: Right Alternate Level Stairs-Number of Steps: flight Home Access: Level entry (has to step up to curb) Bathroom Shower/Tub: TuChiropodistStandard Bathroom Accessibility: Yes How Accessible: Accessible via walker [place]   Discharge Living  Setting Plans for Discharge Living Setting: Patient's home Type of Home at Discharge: Other (Comment) (townhouse) Discharge Home Layout: Two level,1/2 bath on main level Alternate Level Stairs-Rails: Right Alternate Level Stairs-Number of Steps: flight Discharge Home Access: Level entry (has to step up on curb) Discharge Bathroom Shower/Tub: Tub/shower unit Discharge Bathroom Toilet: Standard Discharge Bathroom Accessibility: Yes How Accessible: Accessible via walker Does the patient have any problems obtaining your medications?: Yes (Describe) (expensive)   Social/Family/Support Systems Anticipated Caregiver: JaMarye Roundsister Anticipated Caregiver's Contact Information: [phone] Availability: 24/7 (family is going to make arrangements so they take turns being available) Discharge Plan Discussed with  Primary Caregiver: Yes Is Caregiver In Agreement with Plan?: Yes Does Caregiver/Family have Issues with Lodging/Transportation while Pt is in Rehab?: No   Goals Patient/Family Goal for Rehab: Mod I: PT/OT Expected length of stay: 10-14 days Pt/Family Agrees to Admission and willing to participate: Yes Program Orientation Provided & Reviewed with Pt/Caregiver Including Roles  & Responsibilities: Yes   Decrease burden of Care through IP rehab admission: NA   Possible need for SNF placement upon discharge: NA   Patient Condition: I have reviewed medical records from [facility], spoken with CM, and patient and family member. I met with patient at the bedside for inpatient rehabilitation assessment.  Patient will benefit from ongoing PT and OT, can actively participate in 3 hours of therapy a day 5 days of the week, and can make measurable gains during the admission.  Patient will also benefit from the coordinated team approach during an Inpatient Acute Rehabilitation admission.  The patient will receive intensive therapy as well as Rehabilitation physician, nursing,  social worker, and care management interventions.  Due to safety, skin/wound care, disease management, medication administration and patient education the patient requires 24 hour a day rehabilitation nursing.  The patient is currently Min A-Mod A 10' with mobility and Min A-Max A with basic ADLs.  Discharge setting and therapy post discharge at home with home health is anticipated.  Patient has agreed to participate in the Acute Inpatient Rehabilitation Program and will admit today.   Preadmission Screen Completed By:  Bethel Born, 01/02/2021 12:49 PM ______________________________________________________________________   Discussed status with Dr. Ranell Patrick on 01/02/21  at 12:49 PM and received approval for admission today.   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 Does the need for close, 24 hr/day Medical supervision in concert with the patient's rehab needs make it unreasonable for this patient to be served in a less intensive setting? Yes 2. Co-Morbidities requiring supervision/potential complications: HTN, morbid obesity (BMI 40.47), left sided weakness, disorientation, tobacco abuse 3. Due to bladder management, bowel management, safety, skin/wound care, disease management, medication administration, pain management and patient education, does the patient require 24 hr/day rehab nursing? Yes 4. Does the patient require coordinated care of a physician, rehab nurse, PT, OT to address physical and functional deficits in the context of the above medical diagnosis(es)? Yes Addressing deficits in the following areas: balance, endurance, locomotion, strength, transferring, bowel/bladder control, bathing, dressing, feeding, grooming, toileting and psychosocial support 5. Can the patient actively participate in an intensive therapy program of at least 3 hrs of  therapy 5 days a week? Yes 6. The potential for patient to make measurable gains while on inpatient rehab is excellent 7. Anticipated functional outcomes upon discharge from inpatient rehab: modified independent PT, modified independent OT, independent and modified independent SLP 8. Estimated rehab length of stay to reach the above functional goals is: 10-14 days 9. Anticipated discharge destination: Home 10. Overall Rehab/Functional Prognosis: excellent     MD Signature: Leeroy Cha, MD

## 2021-01-02 NOTE — Progress Notes (Signed)
Report given to RN on 4W. Pt has all belongings. Tele removed, ccmd aware. IV left in place as requested by 4W. Pt going to 4W12A. Pt transported by wheelchair by NT.

## 2021-01-02 NOTE — Progress Notes (Signed)
Patient ID: Aaron Gibson, male   DOB: Jul 28, 1966, 55 y.o.   MRN: [id number] Admit to the unit, reviewed orders, medications, and plan of care. Reviewed covid mask guidelines and visiting policy. Pamelia Hoit

## 2021-01-02 NOTE — Progress Notes (Signed)
Inpatient Rehabilitation Medication Review by a Pharmacist  A complete drug regimen review was completed for this patient to identify any potential clinically significant medication issues.  Clinically significant medication issues were identified:  no  Check AMION for pharmacist assigned to patient if future medication questions/issues arise during this admission.  Pharmacist comments:   Time spent performing this drug regimen review (minutes):  5   Aaron Gibson 01/02/2021 7:14 PM

## 2021-01-02 NOTE — Consult Note (Incomplete)
Physical Medicine and Rehabilitation Consult Reason for Consult: Left side weakness and slurred speech Referring Physician: Dr.Xu   HPI: Aaron Gibson is a 55 y.o. right-handed male with history of untreated hypertension, morbid obesity with BMI of 40.47, tobacco abuse.  Per chart review patient lives alone.  Independent prior to admission.  Two-level home bed and bath upstairs.  He does have family in the area.  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  Presented 12/28/2020 with left-sided weakness and slurred speech.  Cranial CT scan negative for acute changes.  CT angiogram of head and neck with no occlusion or significant stenosis.  Patient did receive TPA.  MRI shows acute infarct within the posterior putamen, posterior body of the caudate and radiating white matter tracts on the right.  No hemorrhage or mass-effect.  EEG negative for seizure.  Echocardiogram with ejection fraction of 65 to 70% no wall motion abnormalities.  Admission chemistries unremarkable except glucose 194, troponin negative, hemoglobin A1c 6.5.  Currently maintained on aspirin and Plavix for CVA prophylaxis x3 weeks then aspirin alone.  Subcutaneous Lovenox for DVT prophylaxis.  TEE is pending.  Therapy evaluations completed with recommendations of physical medicine rehab consult due to left-sided weakness and slurred speech.   Review of Systems  Constitutional: Negative for chills and fever.  HENT: Negative for hearing loss.   Eyes: Negative for blurred vision and double vision.  Respiratory: Negative for cough and shortness of breath.   Cardiovascular: Positive for leg swelling. Negative for chest pain and palpitations.  Gastrointestinal: Positive for constipation. Negative for heartburn, nausea and vomiting.  Genitourinary: Negative for dysuria, flank pain and hematuria.  Musculoskeletal: Positive for joint pain and myalgias.  Skin: Negative for rash.  Neurological:  Positive for speech change and weakness.  All other systems reviewed and are negative.  Past Medical History:  Diagnosis Date  . Hypertension    untreated  . Morbid obesity ([facility])    Past Surgical History:  Procedure Laterality Date  . UMBILICAL HERNIA REPAIR N/A 03/15/2015   Procedure: UMBILICAL HERNIA REPAIR WITH MESH;  Surgeon: Abigail Miyamoto, MD;  Location: [facility] OR;  Service: General;  Laterality: N/A;   History reviewed. No pertinent family history. Social History:  reports that he has been smoking cigarettes. He has a 14.50 pack-year smoking history. He has never used smokeless tobacco. He reports current alcohol use. He reports that he does not use drugs. Allergies: No Known Allergies No medications prior to admission.    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 pt able to move BLE Transfers Overall transfer level: Needs assistance Equipment used: Rolling walker (2 wheeled) Transfers: Sit to/from [employer] Sit to Stand: Min assist Stand pivot transfers: Mod assist,Min assist Squat pivot transfers: Mod assist General transfer comment: pt  requiring modA to power up and steady, able to complete x10 through session with improvement to minA with VC for hand positioning and minA to LUE for positioning Ambulation/Gait Ambulation/Gait assistance: Mod assist,Min assist Gait Distance (Feet): 10 Feet (+ 93ft + 41ft + 20 ft) Assistive device: Rolling walker (2 wheeled) Gait Pattern/deviations: Step-to pattern,Decreased step length - right,Decreased stance time - left,Decreased stride length,Decreased dorsiflexion - left,Decreased weight shift to left,Shuffle,Trunk flexed,Narrow base of support General Gait Details: modA initially with verbal cues for LLE positioning, L grip on RW, and LLE clearance. Pt with improved fluidity with practice, continues to beenfit from cues and intermittent cues for posture/positioning in RW. poor proprioception/coordination with LLE Gait velocity: decreased Gait velocity interpretation: <1.31 ft/sec, indicative of household ambulator    ADL: ADL Overall ADL's : Needs assistance/impaired Eating/Feeding: Set up,Sitting Grooming: Minimal assistance Upper Body Bathing: Minimal assistance,Sitting Lower Body Bathing: Moderate assistance,Sit to/from stand Upper Body Dressing : Moderate assistance,Sitting Lower Body Dressing: Maximal assistance,Sit to/from stand Toilet Transfer: Moderate assistance,Squat-pivot Toileting- Clothing Manipulation and Hygiene: Maximal assistance Functional mobility during ADLs: Moderate assistance (Will need increased assistance for ambulation)  Cognition: Cognition Overall Cognitive Status: Within Functional Limits for tasks assessed Orientation Level: Oriented X4 Cognition Arousal/Alertness: Awake/alert Behavior During Therapy: WFL for tasks assessed/performed Overall Cognitive Status: Within Functional Limits for tasks assessed General Comments: pleasant and able to integrate cues for technique well through session  Blood pressure (!) 152/99, pulse 75, temperature 98.4 F  (36.9 C), temperature source Oral, resp. rate 20, height 5\' 9"  (1.753 m), weight 124.3 kg, SpO2 93 %. Physical Exam Neurological:     Comments: Patient is alert in no acute distress.  Mild dysarthria but intelligible.  Follows simple commands.  Oriented x3.     Results for orders placed or performed during the hospital encounter of 12/28/20 (from the past 24 hour(s))  Basic metabolic panel     Status: None   Collection Time: 01/02/21 12:23 AM  Result Value Ref Range   Sodium 141 135 - 145 mmol/L   Potassium 3.9 3.5 - 5.1 mmol/L   Chloride 105 98 - 111 mmol/L   CO2 27 22 - 32 mmol/L   Glucose, Bld 99 70 - 99 mg/dL   BUN 10 6 - 20 mg/dL   Creatinine, Ser 03/02/21 0.61 - 1.24 mg/dL   Calcium 9.3 8.9 - 5.62 mg/dL   GFR, Estimated 13.0 >86 mL/min   Anion gap 9 5 - 15  CBC     Status: Abnormal   Collection Time: 01/02/21 12:23 AM  Result Value Ref Range   WBC 10.9 (H) 4.0 - 10.5 K/uL   RBC 5.78 4.22 - 5.81 MIL/uL   Hemoglobin 16.8 13.0 - 17.0 g/dL   HCT 03/02/21 84.6 - 96.2 %   MCV 88.6 80.0 - 100.0 fL   MCH 29.1 26.0 - 34.0 pg   MCHC 32.8 30.0 - 36.0 g/dL   RDW 95.2 84.1 - 32.4 %   Platelets 174 150 - 400 K/uL   nRBC 0.0 0.0 - 0.2 %   No results found.  ***  40.1 Chana Lindstrom, PA-C 01/02/2021

## 2021-01-02 NOTE — Progress Notes (Signed)
Inpatient Rehab Admissions Coordinator:  There is a CIR bed available for pt to admit today. Dr. Roda Shutters made aware and is in agreement. NSG and TOC made aware.  Wolfgang Phoenix, MS, CCC-SLP Admissions Coordinator [phone]

## 2021-01-03 ENCOUNTER — Inpatient Hospital Stay (HOSPITAL_COMMUNITY): Payer: Self-pay | Admitting: Physical Therapy

## 2021-01-03 ENCOUNTER — Inpatient Hospital Stay (HOSPITAL_COMMUNITY): Payer: Self-pay | Admitting: Occupational Therapy

## 2021-01-03 DIAGNOSIS — I6381 Other cerebral infarction due to occlusion or stenosis of small artery: Secondary | ICD-10-CM

## 2021-01-03 LAB — COMPREHENSIVE METABOLIC PANEL
ALT: 69 U/L — ABNORMAL HIGH (ref 0–44)
AST: 46 U/L — ABNORMAL HIGH (ref 15–41)
Albumin: 3.3 g/dL — ABNORMAL LOW (ref 3.5–5.0)
Alkaline Phosphatase: 69 U/L (ref 38–126)
Anion gap: 9 (ref 5–15)
BUN: 9 mg/dL (ref 6–20)
CO2: 27 mmol/L (ref 22–32)
Calcium: 9.1 mg/dL (ref 8.9–10.3)
Chloride: 104 mmol/L (ref 98–111)
Creatinine, Ser: 1.07 mg/dL (ref 0.61–1.24)
GFR, Estimated: >60 mL/min (ref >60)
Glucose, Bld: 107 mg/dL — ABNORMAL HIGH (ref 70–99)
Potassium: 4.1 mmol/L (ref 3.5–5.1)
Sodium: 140 mmol/L (ref 135–145)
Total Bilirubin: 0.8 mg/dL (ref 0.3–1.2)
Total Protein: 6.3 g/dL — ABNORMAL LOW (ref 6.5–8.1)

## 2021-01-03 LAB — CBC WITH DIFFERENTIAL/PLATELET
Abs Immature Granulocytes: 0.09 10*3/uL — ABNORMAL HIGH (ref 0.00–0.07)
Basophils Absolute: 0.1 10*3/uL (ref 0.0–0.1)
Basophils Relative: 1 %
Eosinophils Absolute: 0.3 10*3/uL (ref 0.0–0.5)
Eosinophils Relative: 3 %
HCT: 46.2 % (ref 39.0–52.0)
Hemoglobin: 15.9 g/dL (ref 13.0–17.0)
Immature Granulocytes: 1 %
Lymphocytes Relative: 30 %
Lymphs Abs: 3.2 10*3/uL (ref 0.7–4.0)
MCH: 29.8 pg (ref 26.0–34.0)
MCHC: 34.4 g/dL (ref 30.0–36.0)
MCV: 86.5 fL (ref 80.0–100.0)
Monocytes Absolute: 1 10*3/uL (ref 0.1–1.0)
Monocytes Relative: 10 %
Neutro Abs: 5.9 10*3/uL (ref 1.7–7.7)
Neutrophils Relative %: 55 %
Platelets: 169 10*3/uL (ref 150–400)
RBC: 5.34 MIL/uL (ref 4.22–5.81)
RDW: 12.9 % (ref 11.5–15.5)
WBC: 10.6 10*3/uL — ABNORMAL HIGH (ref 4.0–10.5)
nRBC: 0 % (ref 0.0–0.2)

## 2021-01-03 MED ORDER — LIVING WELL WITH DIABETES BOOK
Freq: Once | Status: AC
  Filled 2021-01-03: qty 1

## 2021-01-03 NOTE — Evaluation (Signed)
Physical Therapy Assessment and Plan  Patient Details  Name: Aaron Gibson MRN: [id number] Date of Birth: 15-Mar-1966  PT Diagnosis: Abnormal posture, Abnormality of gait, Difficulty walking, Hemiparesis non-dominant, Low back pain and Muscle weakness Rehab Potential: Excellent ELOS: 7-10 days   Today's Date: 01/03/2021 PT Individual Time: 0801-0859 PT Individual Time Calculation (min): 58 min    Hospital Problem: Principal Problem:   Cerebrovascular accident (CVA) of right basal ganglia ([place])   Past Medical History:  Past Medical History:  Diagnosis Date  . Hypertension    untreated  . Morbid obesity ([place])    Past Surgical History:  Past Surgical History:  Procedure Laterality Date  . BUBBLE STUDY  01/02/2021   Procedure: BUBBLE STUDY;  Surgeon: Freada Bergeron, MD;  Location: [facility];  Service: Cardiovascular;;  . TEE WITHOUT CARDIOVERSION N/A 01/02/2021   Procedure: TRANSESOPHAGEAL ECHOCARDIOGRAM (TEE);  Surgeon: Freada Bergeron, MD;  Location: [facility];  Service: Cardiovascular;  Laterality: N/A;  . UMBILICAL HERNIA REPAIR N/A 03/15/2015   Procedure: UMBILICAL HERNIA REPAIR WITH MESH;  Surgeon: Coralie Keens, MD;  Location: [facility];  Service: General;  Laterality: N/A;    Assessment & Plan Clinical Impression: Patient is a 55 y.o. right-handed male with history of untreated hypertension, chronic back pain, morbid obesity with BMI of 40.47, tobacco abuse.  Per chart review patient lives alone.  Independent prior to admission.  Two-level home bed and bath upstairs.  He does have Aaron Gibson in the area.  Presented 12/28/2020 with left-sided weakness and slurred speech.  Cranial CT scan negative for acute changes.  CT angiogram of head and neck with no occlusion or significant stenosis.  Patient did receive TPA.  MRI shows acute infarct within the posterior putamen, posterior body of the caudate and radiating white matter tracts on the right.  No hemorrhage or  mass-effect.  EEG negative for seizure.  Echocardiogram with ejection fraction of 65 to 70% no wall motion abnormalities.  TEE showed ejection fraction of 60 to 65% no thrombus small PFO.  Admission chemistries were unremarkable except glucose 194 hemoglobin A1c 6.5 troponin negative.  Currently maintained on aspirin and Plavix for CVA prophylaxis x3 weeks and aspirin alone.  Subcutaneous Lovenox for DVT prophylaxis.  Due to patient's left sided weakness, slurred speech, and decrease in functional mobility he was admitted for a comprehensive rehab program.  Patient transferred to CIR on 01/02/2021 .   Patient currently requires min A with mobility secondary to muscle weakness, decreased cardiorespiratoy endurance, impaired timing and sequencing, unbalanced muscle activation, decreased coordination and decreased motor planning,  ,  , decreased safety awareness,   and decreased sitting balance, decreased standing balance, hemiplegia and decreased balance strategies.  Prior to hospitalization, patient was independent  with mobility and lived with Aaron Gibson (53 year old daughter) in a House (2 story townhome) home.  Home access is 3 STE  Patient will benefit from skilled PT intervention to maximize safe functional mobility, minimize fall risk and decrease caregiver burden for planned discharge home with 24 hour supervision.  Anticipate patient will benefit from follow up OP at discharge.  PT - End of Session Activity Tolerance: Tolerates 30+ min activity with multiple rests Endurance Deficit: Yes Endurance Deficit Description: he needed a few resting seated breaks PT Assessment Rehab Potential (ACUTE/IP ONLY): Excellent PT Barriers to Discharge: Home environment access/layout;Decreased caregiver support PT Patient demonstrates impairments in the following area(s): Balance;Nutrition;Edema;Endurance;Motor;Sensory;Safety;Perception;Pain PT Transfers Functional Problem(s): Bed Mobility;Bed to Chair;Car PT  Locomotion Functional Problem(s): Ambulation;Stairs PT  Plan PT Intensity: Minimum of 1-2 x/day ,45 to 90 minutes PT Frequency: 5 out of 7 days PT Duration Estimated Length of Stay: 7-10 days PT Treatment/Interventions: Ambulation/gait training;Balance/vestibular training;Discharge planning;Community reintegration;Disease management/prevention;DME/adaptive equipment instruction;Functional electrical stimulation;Neuromuscular re-education;Functional mobility training;Pain management;Patient/Aaron Gibson education;Psychosocial support;Stair training;Therapeutic Activities;Splinting/orthotics;Therapeutic Exercise;UE/LE Strength taining/ROM;UE/LE Coordination activities;Visual/perceptual remediation/compensation PT Transfers Anticipated Outcome(s): Mod I with LRAD PT Locomotion Anticipated Outcome(s): Supervision with LRAD PT Recommendation Recommendations for Other Services: Therapeutic Recreation consult Therapeutic Recreation Interventions: Outing/community reintergration;Stress management Follow Up Recommendations: Outpatient PT;24 hour supervision/assistance Patient destination: Home Equipment Recommended: To be determined   PT Evaluation Precautions/Restrictions Precautions Precautions: Fall;Other (comment) Precaution Comments: L sided weakness Restrictions Weight Bearing Restrictions: No Pain Pain Assessment Pain Scale: Faces Faces Pain Scale: Hurts a little bit Pain Type: Acute pain Pain Location: Back Pain Orientation: Lower;Medial Pain Descriptors / Indicators: Aching;Sore Pain Onset: With Activity Pain Intervention(s): Rest;Repositioned;Relaxation Home Living/Prior Functioning Home Living Living Arrangements: Other relatives;Parent Available Help at Discharge: Aaron Gibson;Available 24 hours/day (sister who is a CNA) Type of Home: House (2 story townhome) Home Access: Level entry;Stairs to enter Entrance Stairs-Number of Steps: 3 STE Home Layout: Two level;1/2 bath on main  level Alternate Level Stairs-Number of Steps: 1 flight Alternate Level Stairs-Rails: Right Bathroom Shower/Tub: Chiropodist: Standard  Lives With: Aaron Gibson (32yo daughter) Prior Function Level of Independence: Independent with basic ADLs;Independent with homemaking with ambulation;Independent with gait;Independent with transfers  Able to Take Stairs?: Reciprically Driving: Yes Vocation: Unemployed Vocation Requirements: previously worked as Engineer, technical sales 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; did Teacher, English as a foreign language: Within [place]: Intact  Cognition Overall Cognitive Status: Within Functional Limits for tasks assessed Arousal/Alertness: Awake/alert Orientation Level: Oriented X4 Attention: Focused Focused Attention: Appears intact Memory: Appears intact Immediate Memory Recall: Sock;Blue;Bed Memory Recall Sock: Without Cue Memory Recall Blue: Without Cue Memory Recall Bed: Without Cue Awareness: Appears intact Problem Solving: Appears intact Safety/Judgment: Appears intact Sensation Sensation Light Touch: Appears Intact Hot/Cold: Appears Intact Proprioception: Impaired by gross assessment;Impaired Detail Proprioception Impaired Details: Impaired LUE;Impaired LLE Stereognosis: Impaired by gross assessment Additional Comments: Slightly slow processing and initiating motor plan for steps with LLE, intermittent L toe catch during long distance gait Coordination Gross Motor Movements are Fluid and Coordinated: No Coordination and Movement Description: decreased due to weakness and sensory deficits Finger Nose Finger Test: impaired on L  Motor  Motor Motor: Hemiplegia;Abnormal postural alignment and control;Abnormal tone Motor - Skilled Clinical Observations: mild L hemipareisis, UE>LE   Trunk/Postural Assessment  Cervical Assessment Cervical Assessment: Within Functional Limits Thoracic  Assessment Thoracic Assessment: Within Functional Limits Lumbar Assessment Lumbar Assessment: Exceptions to [facility] (Pelvis anteriorly rotated in upright standing posture) Postural Control Postural Control: Within Functional Limits  Balance Balance Balance Assessed: Yes Dynamic Sitting Balance Dynamic Sitting - Balance Support: No upper extremity supported Dynamic Sitting - Level of Assistance: 5: Stand by assistance Dynamic Sitting - Balance Activities: Lateral lean/weight shifting;Forward lean/weight shifting;Reaching for objects;Reaching across midline Sitting balance - Comments: able to static sit on flat bed without UE support Static Standing Balance Static Standing - Balance Support: No upper extremity supported Static Standing - Level of Assistance: 5: Stand by assistance Dynamic Standing Balance Dynamic Standing - Balance Support: During functional activity;No upper extremity supported Dynamic Standing - Level of Assistance: 4: Min assist Dynamic Standing - Balance Activities: Lateral lean/weight shifting;Forward lean/weight shifting;Reaching for objects;Reaching across midline Extremity Assessment   RLE Assessment RLE Assessment: Within Functional Limits Active Range of Motion (AROM) Comments: [facility] General Strength  Comments: grossly 4+/ 5 LLE Assessment LLE Assessment: Exceptions to [facility] Active Range of Motion (AROM) Comments: WFL LLE Strength Left Hip Flexion: 4-/5 Left Hip Extension: 4-/5 Left Hip ABduction: 4-/5 Left Hip ADduction: 3+/5 Left Knee Flexion: 3/5 Left Knee Extension: 4-/5 Left Ankle Dorsiflexion: 4-/5  Care Tool Care Tool Bed Mobility Roll left and right activity   Roll left and right assist level: Supervision/Verbal cueing    Sit to lying activity   Sit to lying assist level: Supervision/Verbal cueing    Lying to sitting edge of bed activity   Lying to sitting edge of bed assist level: Contact Guard/Touching assist     Care Tool Transfers Sit to  stand transfer   Sit to stand assist level: Supervision/Verbal cueing    Chair/bed transfer   Chair/bed transfer assist level: Contact Guard/Touching assist     Toilet transfer   Assist Level: Contact Guard/Touching assist    Car transfer   Car transfer assist level: Contact Guard/Touching assist      Care Tool Locomotion Ambulation   Assist level: Contact Guard/Touching assist Assistive device: No Device Max distance: 300  Walk 10 feet activity   Assist level: Contact Guard/Touching assist Assistive device: No Device   Walk 50 feet with 2 turns activity   Assist level: Contact Guard/Touching assist Assistive device: No Device  Walk 150 feet activity   Assist level: Contact Guard/Touching assist Assistive device: No Device  Walk 10 feet on uneven surfaces activity   Assist level: Contact Guard/Touching assist Assistive device: Other (comment) (intermittent HR use)  Stairs   Assist level: Minimal Assistance - Patient > 75% Stairs assistive device: 2 hand rails Max number of stairs: 12  Walk up/down 1 step activity   Walk up/down 1 step (curb) assist level: Minimal Assistance - Patient > 75% Walk up/down 1 step or curb assistive device: 1 hand rail    Walk up/down 4 steps activity Walk up/down 4 steps assist level: Minimal Assistance - Patient > 75% Walk up/down 4 steps assistive device: 2 hand rails  Walk up/down 12 steps activity   Walk up/down 12 steps assist level: Minimal Assistance - Patient > 75% Walk up/down 12 steps assistive device: 2 hand rails  Pick up small objects from floor   Pick up small object from the floor assist level: Contact Guard/Touching assist Pick up small object from the floor assistive device: no AD  Wheelchair Will patient use wheelchair at discharge?: No          Wheel 50 feet with 2 turns activity      Wheel 150 feet activity        Refer to Care Plan for Long Term Goals  SHORT TERM GOAL WEEK 1 PT Short Term Goal 1 (Week 1): =  to LTG based on ELOS  Recommendations for other services: Therapeutic Recreation  Stress management and Outing/community reintegration  Skilled Therapeutic Intervention Evaluation completed (see details above) with education on PT POC and goals and individual treatment initiated with focus on bed mobility, transfers, gait, and stair mobility with level of assistance and cueing as described below. Stairs performed initially with instructions to perform step-to pattern using BHR for first bout of 4 steps and then progressed to step-over-step using BHR with CGA at L knee to prevent buckling. During gait, pt demonstrates a forward flexed posture at hips to which pt states that he has mild pain through his lower back that does not improve with postural cues for abdominal and glute  activation. Recommended to pt to request that the doctor adjust placement of daily pain patch from scapula, which no longer hurts, to this area. Provided pt with 18" w/c  in order to promote more time OOB, provide access to mobility about floor with more independence, and to improve overall activity tolerance. During gait, he demos intermittent toe catches with L foot but is able to maintain balance throughout with CGA. At end of session, pt was seated upright in w/c with belt alarm on and all needs in reach with tray table accessible.   Mobility Bed Mobility Bed Mobility: Left Sidelying to Sit;Supine to Sit;Sitting - Scoot to [employer] of Bed;Sit to Supine;Scooting to [facility] Left Sidelying to Sit: Supervision/Verbal cueing;Contact Guard/Touching assist Supine to Sit: Supervision/Verbal cueing;Contact Guard/Touching assist Sitting - Scoot to Edge of Bed: Contact Guard/Touching assist Sit to Supine: Contact Guard/Touching assist Scooting to HOB: Supervision/Verbal Cueing Transfers Transfers: Sit to Stand;Stand to Sit;Stand Pivot Transfers Sit to Stand: Contact Guard/Touching assist Stand to Sit: Supervision/Verbal cueing;Contact  Guard/Touching assist Stand Pivot Transfers: Contact Guard/Touching assist Transfer (Assistive device): Rolling walker Locomotion  Gait Ambulation: Yes Gait Assistance: Contact Guard/Touching assist;Minimal Assistance - Patient > 75% Gait Distance (Feet): 300 Feet Assistive device: None Gait Assistance Details: Tactile cues for posture;Verbal cues for gait pattern;Verbal cues for technique Gait Assistance Details: Light Min A initially and progressing to CGA throughout long distanceof 389f Gait Gait: Yes Gait Pattern: Impaired Gait Pattern: Step-through pattern;Decreased step length - left;Decreased hip/knee flexion - right;Poor foot clearance - left Gait velocity: decreased Stairs / Additional Locomotion Stairs: Yes Stairs Assistance: Minimal Assistance - Patient > 75%;Contact Guard/Touching assist Stair Management Technique: Two rails;Step to pattern;Alternating pattern Number of Stairs: 12 Height of Stairs: 6 Wheelchair Mobility Wheelchair Mobility: No   Discharge Criteria: Patient will be discharged from PT if patient refuses treatment 3 consecutive times without medical reason, if treatment goals not met, if there is a change in medical status, if patient makes no progress towards goals or if patient is discharged from hospital.  The above assessment, treatment plan, treatment alternatives and goals were discussed and mutually agreed upon: by patient  JAlger Simons1/10/2021, 1:10 PM

## 2021-01-03 NOTE — Progress Notes (Signed)
[facility] PHYSICAL MEDICINE & REHABILITATION PROGRESS NOTE   Subjective/Complaints: Slept well last night good appetite this a.m. started to work with therapy TEE reviewed small PFO Patient without complaints today.  Review of systems negative chest pain shortness of breath nausea vomiting diarrhea constipation  Objective:   ECHO TEE  Result Date: 01/02/2021    TRANSESOPHOGEAL ECHO REPORT   Patient Name:   Aaron Gibson Date of Exam: 01/02/2021 Medical Rec #:  [id number]     Height:       69.0 in Accession #:    [id number]    Weight:       274.1 lb Date of Birth:  1966/07/25     BSA:          2.362 m Patient Age:    55 years      BP:           134/73 mmHg Patient Gender: M             HR:           80 bpm. Exam Location:  Inpatient Procedure: Transesophageal Echo, Cardiac Doppler and Color Doppler Indications:    CVA  History:        Patient has prior history of Echocardiogram examinations, most                 recent 12/29/2020. Stroke; Risk Factors:Hypertension,                 Dyslipidemia, Current Smoker, Diabetes and Obesity.  Sonographer:    Lavenia Atlas Referring Phys: [phone] DENISE A WOLFE PROCEDURE: The transesophogeal probe was passed without difficulty through the esophogus of the patient. Local oropharyngeal anesthetic was provided with viscous lidocaine. Sedation performed by different physician. The patient was monitored while under deep sedation. Anesthestetic sedation was provided intravenously by Anesthesiology: 319mg  of Propofol, 40mg  of Lidocaine. The patient developed no complications during the procedure. IMPRESSIONS  1. Left ventricular ejection fraction, by estimation, is 60 to 65%. The left ventricle has normal function. The left ventricle has no regional wall motion abnormalities. There is moderate concentric left ventricular hypertrophy.  2. Right ventricular systolic function is normal. The right ventricular size is normal.  3. No left atrial/left atrial appendage thrombus  was detected.  4. The mitral valve is normal in structure. Trivial mitral valve regurgitation.  5. The aortic valve is tricuspid. Aortic valve regurgitation is not visualized. No aortic stenosis is present.  6. There is mild (Grade II) plaque.  7. A small patent foramen ovale is detected by color flow Doppler. 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 FINDINGS  Left Ventricle: Left ventricular ejection fraction, by estimation, is 60 to 65%. The left ventricle has normal function. The left ventricle has no regional wall motion abnormalities. The left ventricular internal cavity size was normal in size. There is  moderate concentric left ventricular hypertrophy. Right Ventricle: The right ventricular size is normal. No increase in right ventricular wall thickness. Right ventricular systolic function is normal. Left Atrium: Left atrial size was normal in size. No left atrial/left atrial appendage thrombus was detected. Right Atrium: Right atrial size was normal in size. Pericardium: There is no evidence of pericardial effusion. Mitral Valve: The mitral valve is normal in structure. Trivial mitral valve regurgitation. Tricuspid Valve: The tricuspid valve is normal in structure. Tricuspid valve regurgitation is trivial. Aortic Valve: The aortic valve is  tricuspid. Aortic valve regurgitation is not visualized. No aortic stenosis is present. Pulmonic Valve: The pulmonic valve was normal in structure. Pulmonic valve regurgitation is not visualized. Aorta: The aortic root and ascending aorta are structurally normal, with no evidence of dilitation. There is mild (Grade II) plaque. IAS/Shunts: Evidence of atrial level shunting detected by color flow Doppler. Agitated saline contrast was given intravenously to evaluate for intracardiac shunting. Agitated saline contrast bubble study was positive with shunting  observed within 3-6 cardiac cycles suggestive of interatrial shunt. A small patent foramen ovale is detected. Laurance Flatten MD Electronically signed by Laurance Flatten MD Signature Date/Time: 01/02/2021/9:46:54 AM    Final    Recent Labs    01/02/21 1941 01/03/21 0533  WBC 10.4 10.6*  HGB 15.8 15.9  HCT 48.1 46.2  PLT 177 169   Recent Labs    01/02/21 0023 01/02/21 1941 01/03/21 0533  NA 141  --  140  K 3.9  --  4.1  CL 105  --  104  CO2 27  --  27  GLUCOSE 99  --  107*  BUN 10  --  9  CREATININE 1.03 1.03 1.07  CALCIUM 9.3  --  9.1    Intake/Output Summary (Last 24 hours) at 01/03/2021 0825 Last data filed at 01/03/2021 0807 Gross per 24 hour  Intake 240 ml  Output 600 ml  Net -360 ml        Physical Exam: Vital Signs Blood pressure (!) 150/95, pulse (!) 58, temperature 98.5 F (36.9 C), SpO2 96 %.    Assessment/Plan: 1. Functional deficits which require 3+ hours per day of interdisciplinary therapy in a comprehensive inpatient rehab setting.  Physiatrist is providing close team supervision and 24 hour management of active medical problems listed below.  Physiatrist and rehab team continue to assess barriers to discharge/monitor patient progress toward functional and medical goals  Care Tool:  Bathing              Bathing assist       Upper Body Dressing/Undressing Upper body dressing        Upper body assist      Lower Body Dressing/Undressing Lower body dressing            Lower body assist Assist for lower body dressing: Independent     Toileting Toileting    Toileting assist Assist for toileting: Independent with assistive device Assistive Device Comment:  (urinal)   Transfers Chair/bed transfer  Transfers assist           Locomotion Ambulation   Ambulation assist              Walk 10 feet activity   Assist           Walk 50 feet activity   Assist           Walk 150 feet  activity   Assist           Walk 10 feet on uneven surface  activity   Assist           Wheelchair     Assist               Wheelchair 50 feet with 2 turns activity    Assist            Wheelchair 150 feet activity     Assist          Blood pressure (!) 150/95, pulse (!) 58, temperature 98.5 F (  36.9 C), SpO2 96 %.   Medical Problem List and Plan: 1.  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  Patient did receive TPA.  TEE showed small PFO without thrombus             -patient may shower             -ELOS/Goals: modI in 10-14 days 2.  Antithrombotics: -DVT/anticoagulation: Lovenox 40 mg daily             -antiplatelet therapy: Aspirin 81 mg daily and Plavix 75 mg daily x3 weeks then aspirin alone 3. Pain Management: Lidoderm patch as directed, Robaxin 500 mg every 8 hours. Pain is well controlled, if continues to be well controlled with therapy, consider making Robaxin PRN.  4. Mood: Trazodone 50 mg nightly             -antipsychotic agents: N/A 5. Neuropsych: This patient is capable of making decisions on his own behalf. 6. Skin/Wound Care: Routine skin checks 7. Fluids/Electrolytes/Nutrition: Routine in and outs with follow-up chemistries 8.  Hypertension.  Labile, continue Norvasc 5 mg daily.  Monitor with increased mobility Vitals:   01/02/21 1950 01/03/21 0425  BP: (!) 166/91 (!) 150/95  Pulse: (!) 58 (!) 58  Temp: 98 F (36.7 C) 98.5 F (36.9 C)  SpO2: 96% 96%    9.  Hyperlipidemia.  Lipitor 10.  Tobacco abuse.  NicoDerm patch.  Counseling 11.  Morbid obesity.  BMI 40.47.  Dietary follow-up 12. Bradycardia: monitor HR TID.  #13.  Small PFO we will consult cardiology, Dr. Excell Seltzer to see whether he thinks button device closure would be appropriate  LOS: 1 days A FACE TO FACE EVALUATION WAS PERFORMED  Erick Colace 01/03/2021, 8:25 AM

## 2021-01-03 NOTE — Plan of Care (Signed)
  Problem: RH Balance Goal: LTG Patient will maintain dynamic sitting balance (PT) Description: LTG:  Patient will maintain dynamic sitting balance with assistance during mobility activities (PT) Flowsheets (Taken 01/03/2021 1742) LTG: Pt will maintain dynamic sitting balance during mobility activities with:: Independent with assistive device  Goal: LTG Patient will maintain dynamic standing balance (PT) Description: LTG:  Patient will maintain dynamic standing balance with assistance during mobility activities (PT) Flowsheets (Taken 01/03/2021 1742) LTG: Pt will maintain dynamic standing balance during mobility activities with:: Supervision/Verbal cueing   Problem: Sit to Stand Goal: LTG:  Patient will perform sit to stand with assistance level (PT) Description: LTG:  Patient will perform sit to stand with assistance level (PT) Flowsheets (Taken 01/03/2021 1742) LTG: PT will perform sit to stand in preparation for functional mobility with assistance level: Independent with assistive device   Problem: RH Bed Mobility Goal: LTG Patient will perform bed mobility with assist (PT) Description: LTG: Patient will perform bed mobility with assistance, with/without cues (PT). Flowsheets (Taken 01/03/2021 1742) LTG: Pt will perform bed mobility with assistance level of: Independent with assistive device    Problem: RH Bed to Chair Transfers Goal: LTG Patient will perform bed/chair transfers w/assist (PT) Description: LTG: Patient will perform bed to chair transfers with assistance (PT). Flowsheets (Taken 01/03/2021 1742) LTG: Pt will perform Bed to Chair Transfers with assistance level: Independent with assistive device    Problem: RH Car Transfers Goal: LTG Patient will perform car transfers with assist (PT) Description: LTG: Patient will perform car transfers with assistance (PT). Flowsheets (Taken 01/03/2021 1742) LTG: Pt will perform car transfers with assist:: Supervision/Verbal cueing    Problem: RH Ambulation Goal: LTG Patient will ambulate in controlled environment (PT) Description: LTG: Patient will ambulate in a controlled environment, # of feet with assistance (PT). Flowsheets (Taken 01/03/2021 1742) LTG: Pt will ambulate in controlled environ  assist needed:: Supervision/Verbal cueing LTG: Ambulation distance in controlled environment: 370ft with LRAD Goal: LTG Patient will ambulate in home environment (PT) Description: LTG: Patient will ambulate in home environment, # of feet with assistance (PT). Flowsheets (Taken 01/03/2021 1742) LTG: Pt will ambulate in home environ  assist needed:: Supervision/Verbal cueing LTG: Ambulation distance in home environment: 73ft with LRAD   Problem: RH Stairs Goal: LTG Patient will ambulate up and down stairs w/assist (PT) Description: LTG: Patient will ambulate up and down # of stairs with assistance (PT) Flowsheets (Taken 01/03/2021 1742) LTG: Pt will ambulate up/down stairs assist needed:: Supervision/Verbal cueing LTG: Pt will  ambulate up and down number of stairs: 15 steps using HR per home setup

## 2021-01-03 NOTE — Progress Notes (Signed)
Physical Therapy Session Note  Patient Details  Name: Aaron Gibson MRN: [id number] Date of Birth: 1966/09/02  Today's Date: 01/03/2021 PT Individual Time: 1315-1410 PT Individual Time Calculation (min): 55 min   Short Term Goals: Week 1:     Skilled Therapeutic Interventions/Progress Updates:    pt received in [facility] and agreeable to therapy. Pt taken to gym total A for time and energy, pt directed in car transfer no AD at [facility] with VC for technique; pt directed in gait with ramp and uneven surface at CGA for ramp with intermittent handrail use and VC for step height on LLE and poor cadence noted. Pt directed in picking up object from floor from standing at [facility] and completed DGI with score of 7/24 which is predictive of falls. PT discussed this outcome measure and score with pt and understandings. Pt directed in x12 stairs with intermittent handrail use VC for only using one, min A to complete with VC for stepping sequence for safety with step to pattern. Pt directed in refitting for new WC for improved positioning and sizing as pt reported he was uncomfortable in previous WC. Pt directed in gait for 100' CGA with VC for increased step height on LLE and trunk extension and improved cadence however unable to improve cadence. Pt returned to room in [facility], total A for energy. Pt requested to use restroom at end of session and gait to restroom 20' CGA, Voided bladder CGA. Pt directed in returning to bed sitting EOB CGA and requested to remain there. Pt left there, alarm set, All needs in reach and in good condition. Call light in hand.    Therapy Documentation Precautions:  Precautions Precautions: Fall,Other (comment) Precaution Comments: L sided weakness Restrictions Weight Bearing Restrictions: No General:   Vital Signs: Therapy Vitals Temp: 98.1 F (36.7 C) Pulse Rate: 63 Resp: 18 BP: 139/69 Patient Position (if appropriate): Sitting Oxygen Therapy SpO2: 95 % O2 Device: Room  Air Pain: Pain Assessment Pain Scale: Faces Pain Score: 0-No pain Faces Pain Scale: Hurts a little bit Pain Type: Acute pain Pain Location: Back Pain Orientation: Lower;Medial Pain Descriptors / Indicators: Aching;Sore Pain Onset: With Activity Pain Intervention(s): Rest;Repositioned;Relaxation Mobility: Transfers Transfers: Sit to Stand;Stand to Sit Sit to Stand: Supervision/Verbal cueing Stand to Sit: Supervision/Verbal cueing Locomotion :    Trunk/Postural Assessment : Cervical Assessment Cervical Assessment: Within Functional Limits Thoracic Assessment Thoracic Assessment: Within Functional Limits Lumbar Assessment Lumbar Assessment: Within Functional Limits Postural Control Postural Control: Within Functional Limits  Balance: Dynamic Sitting Balance Dynamic Sitting - Level of Assistance: 5: Stand by assistance Static Standing Balance Static Standing - Level of Assistance: 5: Stand by assistance Dynamic Standing Balance Dynamic Standing - Level of Assistance: 4: Min assist Exercises:   Other Treatments:      Therapy/Group: Individual Therapy  Barbaraann Faster 01/03/2021, 2:43 PM

## 2021-01-03 NOTE — Progress Notes (Signed)
Inpatient Rehabilitation Center Individual Statement of Services  Patient Name:  Aaron Gibson  Date:  01/03/2021  Welcome to the Inpatient Rehabilitation Center.  Our goal is to provide you with an individualized program based on your diagnosis and situation, designed to meet your specific needs.  With this comprehensive rehabilitation program, you will be expected to participate in at least 3 hours of rehabilitation therapies Monday-Friday, with modified therapy programming on the weekends.  Your rehabilitation program will include the following services:  Physical Therapy (PT), Occupational Therapy (OT), Speech Therapy (ST), 24 hour per day rehabilitation nursing, Therapeutic Recreaction (TR), Neuropsychology, Care Coordinator, Rehabilitation Medicine, Nutrition Services and Pharmacy Services  Weekly team conferences will be held on Wednesdays to discuss your progress.  Your Inpatient Rehabilitation Care Coordinator will talk with you frequently to get your input and to update you on team discussions.  Team conferences with you and your family in attendance may also be held.  Expected length of stay: 10-14 Days  Overall anticipated outcome: MOD I  Depending on your progress and recovery, your program may change. Your Inpatient Rehabilitation Care Coordinator will coordinate services and will keep you informed of any changes. Your Inpatient Rehabilitation Care Coordinator's name and contact numbers are listed  below.  The following services may also be recommended but are not provided by the Inpatient Rehabilitation Center:    Home Health Rehabiltiation Services  Outpatient Rehabilitation Services    Arrangements will be made to provide these services after discharge if needed.  Arrangements include referral to agencies that provide these services.  Your insurance has been verified to be:  UNINSURED Your primary doctor is:  Saguier, Ramon Dredge, PA-C  Pertinent information will be shared  with your doctor and your insurance company.  Inpatient Rehabilitation Care Coordinator:  Lavera Guise, [place] [phone] or [phone]  Information discussed with and copy given to patient by: Andria Rhein, 01/03/2021, 11:19 AM

## 2021-01-03 NOTE — Anesthesia Postprocedure Evaluation (Signed)
Anesthesia Post Note  Patient: Aaron Gibson  Procedure(s) Performed: TRANSESOPHAGEAL ECHOCARDIOGRAM (TEE) (N/A ) BUBBLE STUDY     Patient location during evaluation: Endoscopy Anesthesia Type: MAC Level of consciousness: awake and alert Pain management: pain level controlled Vital Signs Assessment: post-procedure vital signs reviewed and stable Respiratory status: spontaneous breathing, nonlabored ventilation, respiratory function stable and patient connected to nasal cannula oxygen Cardiovascular status: stable and blood pressure returned to baseline Postop Assessment: no apparent nausea or vomiting Anesthetic complications: no   No complications documented.  Last Vitals:  Vitals:   01/02/21 1206 01/02/21 1215  BP: (!) 148/85 (!) 148/85  Pulse: (!) 56 (!) 56  Resp: 16 16  Temp: 36.6 C 36.6 C  SpO2: 98% 98%    Last Pain:  Vitals:   01/02/21 1215  TempSrc: Oral  PainSc:                  [name]

## 2021-01-03 NOTE — Progress Notes (Signed)
Inpatient Rehabilitation Care Coordinator Assessment and Plan Patient Details  Name: Aaron Gibson MRN: [id number] Date of Birth: February 19, 1966  Today's Date: 01/03/2021  Hospital Problems: Principal Problem:   Cerebrovascular accident (CVA) of right basal ganglia [facility])  Past Medical History:  Past Medical History:  Diagnosis Date  . Hypertension    untreated  . Morbid obesity ([place])    Past Surgical History:  Past Surgical History:  Procedure Laterality Date  . BUBBLE STUDY  01/02/2021   Procedure: BUBBLE STUDY;  Surgeon: Freada Bergeron, MD;  Location: [place];  Service: Cardiovascular;;  . TEE WITHOUT CARDIOVERSION N/A 01/02/2021   Procedure: TRANSESOPHAGEAL ECHOCARDIOGRAM (TEE);  Surgeon: Freada Bergeron, MD;  Location: [facility] ENDOSCOPY;  Service: Cardiovascular;  Laterality: N/A;  . UMBILICAL HERNIA REPAIR N/A 03/15/2015   Procedure: UMBILICAL HERNIA REPAIR WITH MESH;  Surgeon: Coralie Keens, MD;  Location: [place];  Service: General;  Laterality: N/A;   Social History:  reports that he has been smoking cigarettes. He has a 29.00 pack-year smoking history. He has never used smokeless tobacco. He reports current alcohol use. He reports that he does not use drugs.  Family / Support Systems Marital Status: Single Patient Roles: Parent Other Supports: Research scientist (life sciences) (Mother), Sister Aaron Gibson) Anticipated Caregiver: Aaron Gibson (sister) Ability/Limitations of Caregiver: none Caregiver Availability: 24/7  Social History Preferred language: English Religion:  Education: Currently in school Read: Yes Write: Yes Employment Status: Employed ([employer]) Name of Employer: [employer]   Abuse/Neglect Abuse/Neglect Assessment Can Be Completed: Yes Physical Abuse: Denies Verbal Abuse: Denies Sexual Abuse: Denies Exploitation of patient/patient's resources: Denies Self-Neglect: Denies  Emotional Status Pt's affect, behavior and adjustment status: no Recent  Psychosocial Issues: no Psychiatric History: no Substance Abuse History: Tobacco  Patient / Family Perceptions, Expectations & Goals Pt/Family understanding of illness & functional limitations: yes Premorbid pt/family roles/activities: Previously independent, working and going to school Anticipated changes in roles/activities/participation: Some assitance from family Pt/family expectations/goals: [name]: None Premorbid Home Care/DME Agencies: None Transportation available at discharge: Family able to transport  Discharge Planning Living Arrangements: Other relatives,Parent Support Systems: Other relatives,Parent Type of Residence: Private residence (2 level townhome (1/2 bath on main)) Insurance Resources: Government social research officer (UNINSURED) Museum/gallery curator Resources: Employment Museum/gallery curator Screen Referred: Yes Living Expenses: Education officer, community Management: Patient Does the patient have any problems obtaining your medications?: No Home Management: Independent Patient/Family Preliminary Plans: Some assistance/independent Care Coordinator Barriers to Discharge: Insurance for SNF coverage Care Coordinator Barriers to Discharge Comments: Patient is UNINSURED Care Coordinator Anticipated Follow Up Needs: HH/OP,SNF Expected length of stay: 10-14 [place]  Clinical Impression SW met patient introduced self. Met with patient briefly, will follow up tomorrow. Patient on phone call.   Aaron Gibson 01/03/2021, 12:36 PM

## 2021-01-03 NOTE — Evaluation (Addendum)
Occupational Therapy Assessment and Plan  Patient Details  Name: Aaron Gibson MRN: [id number] Date of Birth: Aug 25, 1966  OT Diagnosis: hemiplegia affecting non-dominant side and decreased proprioception Rehab Potential: Rehab Potential (ACUTE ONLY): Excellent ELOS: 9 days   Today's Date: 01/03/2021 OT Individual Time: 0945-1100 OT Individual Time Calculation (min): 75 min     Hospital Problem: Principal Problem:   Cerebrovascular accident (CVA) of right basal ganglia ([place])   Past Medical History:  Past Medical History:  Diagnosis Date  . Hypertension    untreated  . Morbid obesity ([place])    Past Surgical History:  Past Surgical History:  Procedure Laterality Date  . BUBBLE STUDY  01/02/2021   Procedure: BUBBLE STUDY;  Surgeon: Freada Bergeron, MD;  Location: [place];  Service: Cardiovascular;;  . TEE WITHOUT CARDIOVERSION N/A 01/02/2021   Procedure: TRANSESOPHAGEAL ECHOCARDIOGRAM (TEE);  Surgeon: Freada Bergeron, MD;  Location: [facility] ENDOSCOPY;  Service: Cardiovascular;  Laterality: N/A;  . UMBILICAL HERNIA REPAIR N/A 03/15/2015   Procedure: UMBILICAL HERNIA REPAIR WITH MESH;  Surgeon: Coralie Keens, MD;  Location: [place];  Service: General;  Laterality: N/A;    Assessment & Plan Clinical Impression:   Aaron Gibson is a 55 year old right-handed male with history of untreated hypertension, chronic back pain, morbid obesity with BMI of 40.47, tobacco abuse.  Per chart review patient lives alone.  Independent prior to admission.  Two-level home bed and bath upstairs.  He does have family in the area.  Presented 12/28/2020 with left-sided weakness and slurred speech.  Cranial CT scan negative for acute changes.  CT angiogram of head and neck with no occlusion or significant stenosis.  Patient did receive TPA.  MRI shows acute infarct within the posterior putamen, posterior body of the caudate and radiating white matter tracts on the right.  No hemorrhage or mass-effect.  EEG  negative for seizure.  Echocardiogram with ejection fraction of 65 to 70% no wall motion abnormalities.  TEE showed ejection fraction of 60 to 65% no thrombus small PFO.  Admission chemistries were unremarkable except glucose 194 hemoglobin A1c 6.5 troponin negative.  Currently maintained on aspirin and Plavix for CVA prophylaxis x3 weeks and aspirin alone.  Subcutaneous Lovenox for DVT prophylaxis.  Due to patient's left sided weakness, slurred speech, and decrease in functional mobility he was admitted for a comprehensive rehab program. He currently has no complaints but he and his sister have a lot of questions about CIR.      Patient transferred to CIR on 01/02/2021 .    Patient currently requires supervision with basic self-care skills secondary to decreased cardiorespiratoy endurance, decreased coordination and decreased proprioception and decreased standing balance, hemiplegia and decreased balance strategies.  Prior to hospitalization, patient was fully independent.  Patient will benefit from skilled intervention to increase independence with basic self-care skills and increase level of independence with iADL prior to discharge home with care partner.  Anticipate patient will require intermittent supervision and follow up outpatient.  OT - End of Session Endurance Deficit: Yes Endurance Deficit Description: he needed a few resting seated breaks OT Assessment Rehab Potential (ACUTE ONLY): Excellent OT Patient demonstrates impairments in the following area(s): Balance;Endurance;Motor;Sensory OT Basic ADL's Functional Problem(s): Grooming;Bathing;Dressing;Toileting OT Advanced ADL's Functional Problem(s): Simple Meal Preparation;Light Housekeeping;Laundry OT Transfers Functional Problem(s): Tub/Shower OT Additional Impairment(s): Fuctional Use of Upper Extremity OT Plan OT Intensity: Minimum of 1-2 x/day, 45 to 90 minutes OT Duration/Estimated Length of Stay: 9 days OT  Treatment/Interventions: Balance/vestibular training;Discharge planning;Functional mobility training;DME/adaptive equipment  instruction;Neuromuscular re-education;Patient/family education;Psychosocial support;Therapeutic Activities;Self Care/advanced ADL retraining;Therapeutic Exercise;UE/LE Strength taining/ROM;UE/LE Coordination activities OT Self Feeding Anticipated Outcome(s): independent OT Basic Self-Care Anticipated Outcome(s): Mod I OT Toileting Anticipated Outcome(s): Mod I OT Bathroom Transfers Anticipated Outcome(s): Mod I OT Recommendation Patient destination: Home Follow Up Recommendations: Outpatient OT Equipment Recommended: Tub/shower seat   OT Evaluation Precautions/Restrictions  Precautions Precautions: Fall Restrictions Weight Bearing Restrictions: No  Pain Pain Assessment Pain Score: 0-No pain Home Living/Prior Functioning Home Living Living Arrangements: Other relatives,Parent Available Help at Discharge: Family,Available 24 hours/day (sister) Type of Home: House (2 story townhome) Home Access: Level entry Home Layout: Two level,1/2 bath on main level Alternate Level Stairs-Number of Steps: flight Alternate Level Stairs-Rails: Right Bathroom Shower/Tub: Chiropodist: Standard  Lives With: Family (2 year old daughter) Prior Function Level of Independence: Independent with basic ADLs,Independent with homemaking with ambulation,Independent with transfers,Independent with gait  Able to Take Stairs?: Yes Driving: Yes Vocation: Unemployed 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; did Theatre manager Baseline Vision/History: Wears glasses Wears Glasses: Distance only Patient Visual Report: No change from baseline Vision Assessment?: Yes;No apparent visual deficits Visual Fields: No apparent deficits Perception  Perception: Within Functional Limits Praxis Praxis: Intact Cognition Overall Cognitive Status: Within Functional Limits  for tasks assessed Arousal/Alertness: Awake/alert Orientation Level: Person;Place;Situation Person: Oriented Place: Oriented Situation: Oriented Year: 2022 Month: January Day of Week: Correct Memory: Appears intact Immediate Memory Recall: Sock;Blue;Bed Memory Recall Sock: Without Cue Memory Recall Blue: Without Cue Memory Recall Bed: Without Cue Awareness: Appears intact Problem Solving: Appears intact Safety/Judgment: Appears intact Sensation Sensation Light Touch: Appears Intact (localization intact with very light touch) Hot/Cold: Appears Intact Proprioception: Impaired by gross assessment Stereognosis: Impaired by gross assessment Additional Comments: pt states his LUE feels numb and heavy Coordination Gross Motor Movements are Fluid and Coordinated: No Fine Motor Movements are Fluid and Coordinated: No Coordination and Movement Description: decreased due to weakness and sensory deficits Finger Nose Finger Test: impaired on L 9 Hole Peg Test: L - 1 min, 17 sec; R - 28 sec   Box and Block L - 56 blocks R - 25 blocks Motor  Motor Motor: Hemiplegia  Trunk/Postural Assessment  Cervical Assessment Cervical Assessment: Within Functional Limits Thoracic Assessment Thoracic Assessment: Within Functional Limits Lumbar Assessment Lumbar Assessment: Within Functional Limits Postural Control Postural Control: Within Functional Limits  Balance Dynamic Sitting Balance Dynamic Sitting - Level of Assistance: 5: Stand by assistance Static Standing Balance Static Standing - Level of Assistance: 5: Stand by assistance Dynamic Standing Balance Dynamic Standing - Level of Assistance: 4: Min assist Extremity/Trunk Assessment RUE Assessment RUE Assessment: Within Functional Limits General Strength Comments: grasp 110 lbs LUE Assessment LUE Assessment: Exceptions to [facility] Passive Range of Motion (PROM) Comments: WFL Active Range of Motion (AROM) Comments: WFL but difficult and  takes pt extra time to achieve full overhead shoulder ROM General Strength Comments: 30 lbs grasp; sh flex 3+/5; elbow flexion 3+/5  Care Tool Care Tool Self Care Eating   Eating Assist Level: Independent    Oral Care    Oral Care Assist Level: Supervision/Verbal cueing (standing at sink)    Bathing   Body parts bathed by patient: Right arm;Left arm;Chest;Abdomen;Front perineal area;Buttocks;Right upper leg;Left upper leg;Right lower leg;Face     Assist Level: Supervision/Verbal cueing    Upper Body Dressing(including orthotics)   What is the patient wearing?: Pull over shirt   Assist Level: Set up assist    Lower Body Dressing (excluding footwear)   What  is the patient wearing?: Underwear/pull up;Pants Assist for lower body dressing: Minimal Assistance - Patient > 75%    Putting on/Taking off footwear   What is the patient wearing?: Socks;Shoes Assist for footwear: Minimal Assistance - Patient > 75%       Care Tool Toileting Toileting activity   Assist for toileting: Supervision/Verbal cueing     Care Tool Bed Mobility Roll left and right activity    Ind    Sit to lying activity    S    Lying to sitting edge of bed activity  S       Care Tool Transfers Sit to stand transfer   Sit to stand assist level: Supervision/Verbal cueing    Chair/bed transfer   Chair/bed transfer assist level: Contact Guard/Touching assist     Toilet transfer   Assist Level: Contact Guard/Touching assist     Care Tool Cognition Expression of Ideas and Wants Expression of Ideas and Wants: Without difficulty (complex and basic) - expresses complex messages without difficulty and with speech that is clear and easy to understand   Understanding Verbal and Non-Verbal Content Understanding Verbal and Non-Verbal Content: Understands (complex and basic) - clear comprehension without cues or repetitions   Memory/Recall Ability *first 3 days only Memory/Recall Ability *first 3 days only:  Current season;Location of own room;Staff names and faces;That he or she is in a hospital/hospital unit    Refer to Care Plan for [place] 1 OT Short Term Goal 1 (Week 1): STGs = LTGs  Recommendations for other services: None    Skilled Therapeutic Intervention ADL ADL Eating: Independent Grooming: Supervision/safety Where Assessed-Grooming: Standing at sink Upper Body Bathing: Setup Where Assessed-Upper Body Bathing: Shower Lower Body Bathing: Supervision/safety Where Assessed-Lower Body Bathing: Shower Upper Body Dressing: Setup Where Assessed-Upper Body Dressing: Chair Lower Body Dressing: Minimal assistance Where Assessed-Lower Body Dressing: Chair Toileting: Supervision/safety Where Assessed-Toileting: Glass blower/designer: Therapist, music Method: Product/process development scientist Method: Heritage manager: Civil engineer, contracting with back Mobility  Transfers Sit to Stand: Supervision/Verbal cueing Stand to Sit: Supervision/Verbal cueing   Pt seen for initial evaluation and ADL training with a focus on balance and use of LUE, followed by LUE strength and coordination exercises.  Pt received in wc and already had his pants on (PT reported that he needed min A).  Pt requested to shower tonight with his sister's help as he is used to doing it at night. Pt agreeable to demonstrating how he could shower. He ambulated to bathroom with out AD with CGA and stepped into shower to sit on seat.  Using a washcloth he "dry washed" to show me how he can use his L arm, sit to stand and reach all areas of his body with S.  He then stepped over to toilet and toileted with distant S.   Donned shirt in standing with set up and then ambulated to sink to brush teeth.  Pt taken to gym via wc using his L arm to push half of the way there. He had difficulty coordinating L hand on wheel but improved with  practice.   In gym worked on [facility] with 9 hole and box and block. Provided pt with med soft theraputty and he practiced using that to improve his grip. Shoulder strength holding 1 lb weight and performing 3 sets of 10 small circles inward and out holding arm at 90 degree angle. Pt  participated extremely well.  Pt taken back to room and opted to rest in wc. Discussed OT goals, POC, ELOS.   Belt alarm on and all needs met.     Discharge Criteria: Patient will be discharged from OT if patient refuses treatment 3 consecutive times without medical reason, if treatment goals not met, if there is a change in medical status, if patient makes no progress towards goals or if patient is discharged from hospital.  The above assessment, treatment plan, treatment alternatives and goals were discussed and mutually agreed upon: by patient  [facility] 01/03/2021, 12:43 PM

## 2021-01-04 ENCOUNTER — Inpatient Hospital Stay (HOSPITAL_COMMUNITY): Payer: Self-pay | Admitting: Physical Therapy

## 2021-01-04 ENCOUNTER — Other Ambulatory Visit: Payer: Self-pay | Admitting: Pulmonary Disease

## 2021-01-04 ENCOUNTER — Inpatient Hospital Stay (HOSPITAL_COMMUNITY): Payer: Self-pay

## 2021-01-04 MED ORDER — PNEUMOCOCCAL VAC POLYVALENT 25 MCG/0.5ML IJ INJ
0.5000 mL | INJECTION | INTRAMUSCULAR | Status: AC
  Administered 2021-01-05: 0.5 mL via INTRAMUSCULAR
  Filled 2021-01-04: qty 0.5

## 2021-01-04 NOTE — Progress Notes (Signed)
Occupational Therapy Session Note  Patient Details  Name: Aaron Gibson MRN: [id number] Date of Birth: 11/12/1966  Today's Date: 01/04/2021 OT Individual Time: 8850-2774 OT Individual Time Calculation (min): 55 min    Short Term Goals: Week 1:  OT Short Term Goal 1 (Week 1): STGs = LTGs  Skilled Therapeutic Interventions/Progress Updates:    Pt sleeping upon arrival but easily aroused. Supine>sit EOB with supervision. Pt requested to use toilet and amb without AD and CGA to bathroom. Pt stood at toilet with CGA to urinate. Pt transported for time management to gym. OT intervention with focus on standing balance and LUE gross motor and FMC tasks including pipe tree, nuts and bolts board, and small beads in theraputty. Pt completed all tasks with more then a reasonable amount of time. Pt states he has good sensation in Lt hand but cannot tell how much pressure to apply. Pt stood at table for pipe tree and nuts/bolts activity. Pt returned to room and remained in w/c. All needs within reach and belt alarm activated.   Therapy Documentation Precautions:  Precautions Precautions: Fall,Other (comment) Precaution Comments: L sided weakness Restrictions Weight Bearing Restrictions: No  Pain:  Pt denies pain this afternoon   Therapy/Group: Individual Therapy  Rich Brave 01/04/2021, 2:40 PM

## 2021-01-04 NOTE — Progress Notes (Signed)
[facility] PHYSICAL MEDICINE & REHABILITATION PROGRESS NOTE   Subjective/Complaints:  DIscussed ECHO results, no pain c/os , has 2-3 steps to enter at home   Review of systems negative chest pain shortness of breath nausea vomiting diarrhea constipation  Objective:   ECHO TEE  Result Date: 01/02/2021    TRANSESOPHOGEAL ECHO REPORT   Patient Name:   Aaron Gibson Date of Exam: 01/02/2021 Medical Rec #:  [id number]     Height:       69.0 in Accession #:    [id number]    Weight:       274.1 lb Date of Birth:  04-07-1966     BSA:          2.362 m Patient Age:    55 years      BP:           134/73 mmHg Patient Gender: M             HR:           80 bpm. Exam Location:  Inpatient Procedure: Transesophageal Echo, Cardiac Doppler and Color Doppler Indications:    CVA  History:        Patient has prior history of Echocardiogram examinations, most                 recent 12/29/2020. Stroke; Risk Factors:Hypertension,                 Dyslipidemia, Current Smoker, Diabetes and Obesity.  Sonographer:    Dustin Flock Referring Phys: [phone] DENISE A WOLFE PROCEDURE: The transesophogeal probe was passed without difficulty through the esophogus of the patient. Local oropharyngeal anesthetic was provided with viscous lidocaine. Sedation performed by different physician. The patient was monitored while under deep sedation. Anesthestetic sedation was provided intravenously by Anesthesiology: 366m of Propofol, 424mof Lidocaine. The patient developed no complications during the procedure. IMPRESSIONS  1. Left ventricular ejection fraction, by estimation, is 60 to 65%. The left ventricle has normal function. The left ventricle has no regional wall motion abnormalities. There is moderate concentric left ventricular hypertrophy.  2. Right ventricular systolic function is normal. The right ventricular size is normal.  3. No left atrial/left atrial appendage thrombus was detected.  4. The mitral valve is normal in structure.  Trivial mitral valve regurgitation.  5. The aortic valve is tricuspid. Aortic valve regurgitation is not visualized. No aortic stenosis is present.  6. There is mild (Grade II) plaque.  7. A small patent foramen ovale is detected by color flow Doppler. 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 FINDINGS  Left Ventricle: Left ventricular ejection fraction, by estimation, is 60 to 65%. The left ventricle has normal function. The left ventricle has no regional wall motion abnormalities. The left ventricular internal cavity size was normal in size. There is  moderate concentric left ventricular hypertrophy. Right Ventricle: The right ventricular size is normal. No increase in right ventricular wall thickness. Right ventricular systolic function is normal. Left Atrium: Left atrial size was normal in size. No left atrial/left atrial appendage thrombus was detected. Right Atrium: Right atrial size was normal in size. Pericardium: There is no evidence of pericardial effusion. Mitral Valve: The mitral valve is normal in structure. Trivial mitral valve regurgitation. Tricuspid Valve: The tricuspid valve is normal in structure. Tricuspid valve regurgitation is trivial. Aortic Valve: The aortic valve is tricuspid. Aortic valve regurgitation is  not visualized. No aortic stenosis is present. Pulmonic Valve: The pulmonic valve was normal in structure. Pulmonic valve regurgitation is not visualized. Aorta: The aortic root and ascending aorta are structurally normal, with no evidence of dilitation. There is mild (Grade II) plaque. IAS/Shunts: Evidence of atrial level shunting detected by color flow Doppler. Agitated saline contrast was given intravenously to evaluate for intracardiac shunting. Agitated saline contrast bubble study was positive with shunting observed within 3-6 cardiac cycles suggestive of interatrial  shunt. A small patent foramen ovale is detected. Gwyndolyn Kaufman MD Electronically signed by Gwyndolyn Kaufman MD Signature Date/Time: 01/02/2021/9:46:54 AM    Final    Recent Labs    01/02/21 1941 01/03/21 0533  WBC 10.4 10.6*  HGB 15.8 15.9  HCT 48.1 46.2  PLT 177 169   Recent Labs    01/02/21 0023 01/02/21 1941 01/03/21 0533  NA 141  --  140  K 3.9  --  4.1  CL 105  --  104  CO2 27  --  27  GLUCOSE 99  --  107*  BUN 10  --  9  CREATININE 1.03 1.03 1.07  CALCIUM 9.3  --  9.1    Intake/Output Summary (Last 24 hours) at 01/04/2021 0818 Last data filed at 01/04/2021 0520 Gross per 24 hour  Intake 355 ml  Output 925 ml  Net -570 ml        Physical Exam: Vital Signs Blood pressure (!) 160/93, pulse 74, temperature 97.9 F (36.6 C), resp. rate 18, SpO2 100 %.    Assessment/Plan: 1. Functional deficits which require 3+ hours per day of interdisciplinary therapy in a comprehensive inpatient rehab setting.  Physiatrist is providing close team supervision and 24 hour management of active medical problems listed below.  Physiatrist and rehab team continue to assess barriers to discharge/monitor patient progress toward functional and medical goals  Care Tool:  Bathing    Body parts bathed by patient: Right arm,Left arm,Chest,Abdomen,Front perineal area,Buttocks,Right upper leg,Left upper leg,Right lower leg,Face         Bathing assist Assist Level: Supervision/Verbal cueing     Upper Body Dressing/Undressing Upper body dressing   What is the patient wearing?: Pull over shirt    Upper body assist Assist Level: Set up assist    Lower Body Dressing/Undressing Lower body dressing      What is the patient wearing?: Underwear/pull up,Pants     Lower body assist Assist for lower body dressing: Minimal Assistance - Patient > 75%     Toileting Toileting    Toileting assist Assist for toileting: Supervision/Verbal cueing Assistive Device Comment:   (urinal)   Transfers Chair/bed transfer  Transfers assist     Chair/bed transfer assist level: Minimal Assistance - Patient > 75%     Locomotion Ambulation   Ambulation assist      Assist level: Contact Guard/Touching assist Assistive device: No Device Max distance: 300   Walk 10 feet activity   Assist     Assist level: Contact Guard/Touching assist Assistive device: No Device   Walk 50 feet activity   Assist    Assist level: Contact Guard/Touching assist Assistive device: No Device    Walk 150 feet activity   Assist    Assist level: Contact Guard/Touching assist Assistive device: No Device    Walk 10 feet on uneven surface  activity   Assist     Assist level: Contact Guard/Touching assist Assistive device: Other (comment) (intermittent HR use)   Wheelchair  Assist Will patient use wheelchair at discharge?: No             Wheelchair 50 feet with 2 turns activity    Assist            Wheelchair 150 feet activity     Assist          Blood pressure (!) 160/93, pulse 74, temperature 97.9 F (36.6 C), resp. rate 18, SpO2 100 %.   Medical Problem List and Plan: 1.  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  Patient did receive TPA.  TEE showed small PFO without thrombus             -patient may shower             -ELOS/Goals: modI in 10-14 days Team conference today please see physician documentation under team conference tab, met with team  to discuss problems,progress, and goals. Formulized individual treatment plan based on medical history, underlying problem and comorbidities.  2.  Antithrombotics: -DVT/anticoagulation: Lovenox 40 mg daily             -antiplatelet therapy: Aspirin 81 mg daily and Plavix 75 mg daily x3 weeks then aspirin alone 3. Pain Management: Lidoderm patch as directed, Robaxin 500 mg every 8 hours. Pain is well controlled,  if continues to be well controlled with therapy, consider making Robaxin PRN.  4. Mood: Trazodone 50 mg nightly             -antipsychotic agents: N/A 5. Neuropsych: This patient is capable of making decisions on his own behalf. 6. Skin/Wound Care: Routine skin checks 7. Fluids/Electrolytes/Nutrition: Routine in and outs with follow-up chemistries 8.  Hypertension.  Labile, continue Norvasc 5 mg daily.  Monitor with increased mobility Vitals:   01/03/21 2034 01/04/21 0522  BP: (!) 165/88 (!) 160/93  Pulse: 74 74  Resp:    Temp: 98.4 F (36.9 C) 97.9 F (36.6 C)  SpO2: 97% 100%    9.  Hyperlipidemia.  Lipitor 10.  Tobacco abuse.  NicoDerm patch.  Counseling 11.  Morbid obesity.  BMI 40.47.  Dietary follow-up 12. Bradycardia: monitor HR TID.  #13.  Small PFO we will ask cardiology, Dr. Burt Knack to see whether he thinks button device closure would be appropriate, will need Neuro to weigh in whether shunt represents a recurrent CVA risk Pt  Current CVA c/w SVD, pt has risk factors of HTN and HLD  LOS: 2 days A FACE TO FACE EVALUATION WAS PERFORMED  Charlett Blake 01/04/2021, 8:18 AM

## 2021-01-04 NOTE — Progress Notes (Signed)
Patient ID: Markese Bloxham, male   DOB: 1966/11/13, 55 y.o.   MRN: [id number] Reviewed role of nurse CM and collaboration with the SW to facilitate preparation for discharge. Reviewed secondary stroke risks and management of HTN, HLD and pre-diabetes with A1c of 6.5 and smoking risks. Reviewed DAPT for 3 weeks per MD then change to ASA solo. Patient given handouts on  DASH diet and carbohydrate counting along with smoking cessation info. Patient reported he did not want to use the nicoderm patch as he did not have cravings for smoking and did not feel he needed the lidoderm patch or mucinex. Patient reviewing handbooks on blood pressure management and exercise and your heart. Continue to follow along to discharge and address educational needs. No other concerns or questions noted at present. Pamelia Hoit

## 2021-01-04 NOTE — Progress Notes (Signed)
Patient ID: Aaron Gibson, male   DOB: 26-Apr-1966, 55 y.o.   MRN: [id number] Team Conference Report to Patient/Family  Team Conference discussion was reviewed with the patient and caregiver, including goals, any changes in plan of care and target discharge date.  Patient and caregiver express understanding and are in agreement.  The patient has a target discharge date of 01/10/21.  Andria Rhein 01/04/2021, 1:19 PM

## 2021-01-04 NOTE — Patient Care Conference (Signed)
Inpatient RehabilitationTeam Conference and Plan of Care Update Date: 01/04/2021   Time: 10:26 AM    Patient Name: Aaron Gibson      Medical Record Number: [id number]  Date of Birth: [birth year] Sex: Male         Room/Bed: 4W12C/4W12C-01 Payor Info: Payor: MEDICAID POTENTIAL / Plan: MEDICAID POTENTIAL / Product Type: *No Product type* /    Admit Date/Time:  01/02/2021  6:28 PM  Primary Diagnosis:  Cerebrovascular accident (CVA) of right basal ganglia [facility])  Hospital Problems: Principal Problem:   Cerebrovascular accident (CVA) of right basal ganglia [facility])    Expected Discharge Date: Expected Discharge Date: 01/10/21  Team Members Present: Care Coodinator Present: Chana Bode, RN, BSN, CRRN;Christina [place], BSW Nurse Present: Despina Hidden, RN PT Present: Grier Rocher, PT OT Present: Other (comment) Annye English, OT) PPS Coordinator present : Edson Snowball, Park Breed, SLP     Current Status/Progress Goal Weekly Team Focus  Bowel/Bladder   Patient is continent bowel /bladder. Last bowel movement 01/03/2021  Patient will remain continent with normal bowel and bladder pattern  Toilet patient as needed   Swallow/Nutrition/ Hydration             ADL's   close S with self care, very min A with LB dressing, light CGA with ambulation to bathroom  mod I  L side coordination, balance, ADL/IADL training, pt education   Mobility   SBA bed mobility, CGA STS and stand pivot transfers no AD, Min A/ CGA for gait up to 320ft no AD, 12 stairs BHR CGA/ Min A  mod I/  supervision overall  transfer training, gait training, stair negotiation, dynamic standing balance, activity tolerance, pt educ, safety awareness   Communication             Safety/Cognition/ Behavioral Observations            Pain   Patient has left sided weakness with muscle pain. Pain manage with Robaxin 500mg , Lidoderm patch.  Pain assessment on qshift and MD notification if pain persist.  Assess patient  for pain q shift and as needed   Skin   No skin breakdown observed  Skin will remain intact witout any breakdown or infection. Assess skin every shift and as needed  Assess skin every shift and as needed     Discharge Planning:  Goal to discharge home with sister to assit. UNINSURED. 2 level home, 1/2 bath on main level   Team Discussion: Cardiology follow up on PFO and SVD. Secondary stroke risk management ongoing.  Patient on target to meet rehab goals: yes, supervision for self care except min assist for lower body care. CGA for toileting and showers. CGA for transfers with Mod I goals  *See Care Plan and progress notes for long and short-term goals.   Revisions to Treatment Plan:  Trial with [facility]  Teaching Needs: Transfers, toileting, medications, secondary stroke risks, etc.  Current Barriers to Discharge: Decreased caregiver support and Home enviroment access/layout  Possible Resolutions to Barriers: Family education ongoing     Medical Summary Current Status: ECHO showing small PFO but Neuro does not feel this is cause of CVA  Barriers to Discharge: Medical stability   Possible Resolutions to Barriers/Weekly Focus: will need to adjust BP meds   Continued Need for Acute Rehabilitation Level of Care: The patient requires daily medical management by a physician with specialized training in physical medicine and rehabilitation for the following reasons: Direction of a multidisciplinary physical rehabilitation program to maximize  functional independence : Yes Medical management of patient stability for increased activity during participation in an intensive rehabilitation regime.: Yes Analysis of laboratory values and/or radiology reports with any subsequent need for medication adjustment and/or medical intervention. : Yes   I attest that I was present, lead the team conference, and concur with the assessment and plan of the team.   Chana Bode B 01/04/2021, 2:48 PM

## 2021-01-04 NOTE — Progress Notes (Signed)
Physical Therapy Session Note  Patient Details  Name: Aaron Gibson MRN: [id number] Date of Birth: 1966/09/24  Today's Date: 01/04/2021 PT Individual Time: 0850-1000 and 1102-1200 PT Individual Time Calculation (min): 70 min and 58 min   Short Term Goals: Week 1:  PT Short Term Goal 1 (Week 1): = to LTG based on ELOS  Skilled Therapeutic Interventions/Progress Updates:  Session 1  Gait training with no AD and supervision assist. Pt noted to have decreased speed and cadence but able to clear LLE throughout with only cues for improve heel contact.  Pt reports mild dizziness after ~154f requiring standing rest break. PT performed orthostatic VS following gait training   170/98. HR92 sitting  151/106. HR 93 standing 0 min   180/106. HR 91 standing 2 min . No hypertensive s/s.   Dynamic balance training on airex pad to perform cross body and lateral reach while standing then toss to target on floor. Supervision assist with cues for improved trunk rotation to the R intermittently. No LOB throughout BP re assess upon completion. 158/93. HR80    Dynamic gait training  Weave through 8 cones x 2  Forward/reverse gait 2 x 12 ft each direction.  Side stepping L and R 2 x 12 ft.  Gait training with SPC 419fx 2 .  Supervision  Assist throughout with min cues for increased step length on the LLE as well improved posture and proper reciprocal gait pattern. Pt able to demonstrate improved reciprocal pattern and posture following cues. PB reassessed following gait training 160/94. [id number] demonstrates increased fall risk as noted by score of   46/56 on Berg Balance Scale.  (<36= high risk for falls, close to 100%; 37-45 significant >80%; 46-51 moderate >50%; 52-55 lower >25%). BP assessed in siting. 154/89 HR 83.   Patient returned to room and [facility] and all needs met.     Session 2.   Pt received sitting in WC and agreeable to PT. Pt transported to rehab  gym. gia ttraining with SPC through gym space with supervision assist for safety. Min cues for reciprocal pattern following turns.  Dynamic gait training with SPC to navigate ramp and mulched surface 2 x 3039fith supervision assist. Min cues for threshold management and improved reciprocal pattern. Pt able to verbalize all mistakes and then self correct following instruction.  Pt also performed threshold management to step over 1 inch obstacles on floor 2 x 4 with supervision assist from PT with min cues for AD management.   Stair negotiation training to ascend and descend with rail on the R and cane on the L with ascent. Performed 4x 2 with supervision assist overall and min cues for AD management and step to gait pattern. No LOB noted.   Dynamic balance/strengthening instructed by PT to perform the [facility] A and B  balance program.   A: sit<>stand x 5, LAQ x 10, hip abductionx 10, HS curl x 10, tandem stance  B: forwards reverse gait with SPC, figure 8 with SPC, sidestepping 29f46fand L, SLS 2 x 10 sec BLE. Tandem walk in parallel bars 2 x 8 ft.   Patient returned to room and left sitting in [facility] call bell in reach and all needs met.         Therapy Documentation Precautions:  Precautions Precautions: Fall,Other (comment) Precaution Comments: L sided weakness Restrictions Weight Bearing Restrictions: No   Pain: Pain Assessment Pain Scale:  0-10 Pain Score: 0-No pain     Balance: Balance Balance Assessed: Yes Standardized Balance Assessment Standardized Balance Assessment: Berg Balance Test Berg Balance Test Sit to Stand: Able to stand without using hands and stabilize independently Standing Unsupported: Able to stand safely 2 minutes Sitting with Back Unsupported but Feet Supported on Floor or Stool: Able to sit safely and securely 2 minutes Stand to Sit: Controls descent by using hands Transfers: Able to transfer safely, definite need of hands Standing Unsupported  with Eyes Closed: Able to stand 10 seconds with supervision Standing Ubsupported with Feet Together: Able to place feet together independently and stand for 1 minute with supervision From Standing, Reach Forward with Outstretched Arm: Can reach confidently >25 cm (10") From Standing Position, Pick up Object from Floor: Able to pick up shoe safely and easily From Standing Position, Turn to Look Behind Over each Shoulder: Looks behind from both sides and weight shifts well Turn 360 Degrees: Able to turn 360 degrees safely but slowly Standing Unsupported, Alternately Place Feet on Step/Stool: Able to complete >2 steps/needs minimal assist Standing Unsupported, One Foot in Front: Able to plae foot ahead of the other independently and hold 30 seconds Standing on One Leg: Able to lift leg independently and hold > 10 seconds Total Score: 46    Therapy/Group: Individual Therapy  Lorie Phenix 01/04/2021, 10:02 AM

## 2021-01-05 ENCOUNTER — Inpatient Hospital Stay (HOSPITAL_COMMUNITY): Payer: Self-pay | Admitting: Physical Therapy

## 2021-01-05 ENCOUNTER — Inpatient Hospital Stay (HOSPITAL_COMMUNITY): Payer: Self-pay

## 2021-01-05 ENCOUNTER — Inpatient Hospital Stay (HOSPITAL_COMMUNITY): Payer: Self-pay | Admitting: *Deleted

## 2021-01-05 MED ORDER — ZOLPIDEM TARTRATE 5 MG PO TABS
5.0000 mg | ORAL_TABLET | Freq: Every evening | ORAL | Status: DC | PRN
  Administered 2021-01-05 – 2021-01-06 (×2): 5 mg via ORAL
  Filled 2021-01-05 (×2): qty 1

## 2021-01-05 NOTE — Progress Notes (Signed)
Physical Therapy Session Note  Patient Details  Name: Aaron Gibson MRN: [id number] Date of Birth: 27-Jul-1966  Today's Date: 01/05/2021 PT Individual Time: 0804-0905 PT Individual Time Calculation (min): 61 min   Short Term Goals: Week 1:  PT Short Term Goal 1 (Week 1): = to LTG based on ELOS  Skilled Therapeutic Interventions/Progress Updates:    Patient seated EOB upon PT arrival with sister (primary caregiver) in room assisting with morning washing and changing clothes. Patient alert and agreeable to PT session. Patient denied pain during session.  Therapeutic Activity: Transfers: Patient performed sit to/from stand throughout session with supervision. Provided verbal cues initially for midline orientation in order to facilitate equal weight bearing and muscle recruitment bilaterally. Pt is focused and conscious of L side use.   Gait Training:  Patient ambulated >200 feet using SPC with SBA. Provided verbal cues for increasing L knee flexion during swing through and improving heel strike. Pt instructed to exaggerate stepping movement in order to increase strength, muscle activation and coordination of overall movement.   Neuromuscular Re-ed: NMR facilitated with seated dynamic reaching activities performed while dressing. Pt is able to reach items in and far outside BOS from floor level to overhead with focus on use of LUE and LLE. No vc required and performed with supervision. Dynamic standing activity using 3 and 4 lb weighted objects at 4-5 feet from pt requiring dynamic step of LLE to reach. Performed with minimal vc for technique. Progressed to reaching for dropped weights as well as small toss to this PT.   Pt guided in dynamic gait activities using floor ladder in order to improve knee/ hip flexion of LLE as well as to promote increased strengthening and control of ankle stabilizers in single leg stance. Ladder drills included stepping both feet into each square leading with LLE,  reciprocating stepping with one foot in each square and increasing single leg stance time with each bout, stepping bilaterally into each square then laterally outside each square, and lateral stepping in both directions. With increasing single leg stance time, pt demonstrates intermittent loss of balance which he is able to correct with lateral step in order to maintain balance in each occurrence.    Pt upgraded after session for discontinuing use of bed and chair alarms d/t good safety awareness. When/ if COVID restrictions allow, pt is instructed to utilize w/c to mobilize about unit in order to increase BLE hamstring strength, increase mobility, and decrease potential for isolation depression.   Patient seated upright in w/c at end of session with brakes locked, and all needs within reach.    Therapy Documentation Precautions:  Precautions Precautions: Fall,Other (comment) Precaution Comments: L sided weakness Restrictions Weight Bearing Restrictions: No  Therapy/Group: Individual Therapy  Loel Dubonnet 01/05/2021, 9:50 AM

## 2021-01-05 NOTE — Evaluation (Signed)
Recreational Therapy Assessment and Plan  Patient Details  Name: Aaron Gibson MRN: [id number] Date of Birth: 1966-07-02 Today's Date: 01/05/2021  Rehab Potential:  Good ELOS:   d/c 01/10/21  Assessment  Hospital Problem: Principal Problem:   Cerebrovascular accident (CVA) of right basal ganglia ([place])   Past Medical History:      Past Medical History:  Diagnosis Date  . Hypertension    untreated  . Morbid obesity ([place])    Past Surgical History:       Past Surgical History:  Procedure Laterality Date  . BUBBLE STUDY  01/02/2021   Procedure: BUBBLE STUDY;  Surgeon: Freada Bergeron, MD;  Location: [place];  Service: Cardiovascular;;  . TEE WITHOUT CARDIOVERSION N/A 01/02/2021   Procedure: TRANSESOPHAGEAL ECHOCARDIOGRAM (TEE);  Surgeon: Freada Bergeron, MD;  Location: [place];  Service: Cardiovascular;  Laterality: N/A;  . UMBILICAL HERNIA REPAIR N/A 03/15/2015   Procedure: UMBILICAL HERNIA REPAIR WITH MESH;  Surgeon: Coralie Keens, MD;  Location: [place];  Service: General;  Laterality: N/A;    Assessment & Plan Clinical Impression: Patient is a 55 y.o. right-handed male with history of untreated hypertension, chronic back pain, morbid obesity with BMI of 40.47, tobacco abuse. Per chart review patient lives alone. Independent prior to admission. Two-level home bed and bath upstairs. He does have family in the area. Presented 12/28/2020 with left-sided weakness and slurred speech. Cranial CT scan negative for acute changes. CT angiogram of head and neck with no occlusion or significant stenosis. Patient did receive TPA. MRI shows acute infarct within the posterior putamen, posterior body of the caudate and radiating white matter tracts on the right. No hemorrhage or mass-effect. EEG negative for seizure. Echocardiogram with ejection fraction of 65 to 70% no wall motion abnormalities. TEE showed ejection fraction of 60 to 65% no thrombus small  PFO. Admission chemistries were unremarkable except glucose 194 hemoglobin A1c 6.5 troponin negative. Currently maintained on aspirin and Plavix for CVA prophylaxis x3 weeks and aspirin alone. Subcutaneous Lovenox for DVT prophylaxis. Due to patient's left sided weakness, slurred speech, and decrease in functional mobility he was admitted for a comprehensive rehab program.  Patient transferred to CIR on 01/02/2021 .   Pt presents with decreased activity tolerance, decreased functional mobility, decreased balance Limiting pt's independence with leisure/community pursuits.  Met with pt to discuss leisure education, stress management/relaxation strategies.  Pt stated understanding of education, had already initiated a plan to increase overall health and wellness at discharge through leisure opportunities, identified his support system and  plans to implement changes once home.  Plan  No further TR   Recommendations for other services: None   Discharge Criteria: Patient will be discharged from TR if patient refuses treatment 3 consecutive times without medical reason.  If treatment goals not met, if there is a change in medical status, if patient makes no progress towards goals or if patient is discharged from hospital.  The above assessment, treatment plan, treatment alternatives and goals were discussed and mutually agreed upon: by patient  [place] 01/05/2021, 4:07 PM

## 2021-01-05 NOTE — Progress Notes (Signed)
Physical Therapy Session Note  Patient Details  Name: Aaron Gibson MRN: [id number] Date of Birth: 01/15/1966  Today's Date: 01/05/2021 PT Individual Time: 1435-1530 PT Individual Time Calculation (min): 55 min   Short Term Goals: Week 1:  PT Short Term Goal 1 (Week 1): = to LTG based on ELOS  Skilled Therapeutic Interventions/Progress Updates:   Pt received sitting EOB with Recreational therapy present, and agreeable to PT. Gait training throughout hospital with [place] 1000, 500, and 325f; supervision assist throughout with only min cues for SW[facility] Hospitalplacement and step height . Gait through hospital gift shop with SW[place]x 202fand then without AD x 20059fMin cues throughout for improved attention to the LUE ot prevent hitting multiple obstacles on L side. Dynamic gait training with SPC over concrete sidewalk x 400f36fin cues for step height on the LLE throughout due to intermittent foot drag; able to correct following cues.   nustep reciprocal endurance training level  6 x 10 min with cues to maintain SPM>45. Pt reports RPE 13/20 upon completion.   Dynamic standing balance on airex pad while performing fine/gross motor task of wii bowling with LUE. Pt performed 6 frames with supervision assist throughout balance training with several near LOB to the L, but able to self correct without assist.   Patient returned to room and left sitting on EOB with call bell in reach and all needs met.               Therapy Documentation Precautions:  Precautions Precautions: Fall,Other (comment) Precaution Comments: L sided weakness Restrictions Weight Bearing Restrictions: No    Vital Signs: Therapy Vitals Temp: (!) 97.5 F (36.4 C) Temp Source: Oral Pulse Rate: 77 Resp: 16 BP: (!) 141/75 Patient Position (if appropriate): Lying Oxygen Therapy SpO2: 98 % O2 Device: Room Air Pain: Pain Assessment Pain Scale: Faces Faces Pain Scale: Hurts a little bit Pain Type: Acute pain Pain Location:  Back    Therapy/Group: Individual Therapy  AustLorie Phenix3/2022, 3:58 PM

## 2021-01-05 NOTE — Progress Notes (Signed)
Occupational Therapy Session Note  Patient Details  Name: Aaron Gibson MRN: [id number] Date of Birth: August 05, 1966  Today's Date: 01/05/2021 OT Individual Time: 1101-1155 OT Individual Time Calculation (min): 54 min    Short Term Goals: Week 1:  OT Short Term Goal 1 (Week 1): STGs = LTGs  Skilled Therapeutic Interventions/Progress Updates:    Pt received in w/c with sister present, agreeable to therapy. Session focus on LUE NMR in prep for improved ADL/IADL performance. Transported to gym in w/c 2/2 time, stand-pivot to mat with close S + SPC. Pt reports he feels his main limitation is his ability to gauge appropriate pressure on objects with his L hand. He reports he cannot feel how "hard he is squeezing things." Pt with no reports/sx of L digit numbness, stereognosis intact when retrieving common items from bag with eyes closed.  Pt completed the following BUE therex 2x10 with the 3, then 4 lb dowel bar: forward/back rows, B shoulder flexion, B shoulder flexion hold for 30 seconds, bicep curls, scapular elevation/protraction. C/o of minimal "pinch" pain below neck with scapular elevation on L side, ceases with activity cessation.   Completed the following exercises 1x10 with least resistive yellow theraputty and provided printed HEP to pt: putty squeezes, key pinch, digit extension, pinch and pull, digit adduction/abduction, lumbrical flexion. Pt additionally retrieved 10 beads from putty with L digits.   Amb with SPC with close S back to room. Pt left in w/c with call bell in reach, and all immediate needs met. Pt cleared by PT/RN earlier this AM on safety plan to be ind in w/c in his room.   Therapy Documentation Precautions:  Precautions Precautions: Fall,Other (comment) Precaution Comments: L sided weakness Restrictions Weight Bearing Restrictions: No Pain: Pain Assessment Pain Scale: Faces Faces Pain Scale: Hurts a little bit Pain Type: Acute pain Pain Location: Back ADL: See  Care Tool for more details.   Therapy/Group: Individual Therapy  Volanda Napoleon, MS, OTR/L 01/05/2021, 12:04 PM

## 2021-01-05 NOTE — Progress Notes (Signed)
Patient ID: Aaron Gibson, male   DOB: 08/01/1966, 55 y.o.   MRN: [id number]   Patient MATCH entered into system, effective 01/09/2021, expires 01/16/2021  Lavera Guise, BSW [phone]

## 2021-01-05 NOTE — Progress Notes (Signed)
PHYSICAL MEDICINE & REHABILITATION PROGRESS NOTE   Subjective/Complaints:  DIscussed ECHO results per neuro PFO very small not cause of CVA   Review of systems negative chest pain shortness of breath nausea vomiting diarrhea constipation  Objective:   No results found. Recent Labs    01/02/21 1941 01/03/21 0533  WBC 10.4 10.6*  HGB 15.8 15.9  HCT 48.1 46.2  PLT 177 169   Recent Labs    01/02/21 1941 01/03/21 0533  NA  --  140  K  --  4.1  CL  --  104  CO2  --  27  GLUCOSE  --  107*  BUN  --  9  CREATININE 1.03 1.07  CALCIUM  --  9.1    Intake/Output Summary (Last 24 hours) at 01/05/2021 0827 Last data filed at 01/05/2021 0720 Gross per 24 hour  Intake 495 ml  Output 1700 ml  Net -1205 ml        Physical Exam: Vital Signs Blood pressure (!) 157/95, pulse 76, temperature (!) 97.5 F (36.4 C), temperature source Oral, resp. rate 18, SpO2 93 %.   General: No acute distress Mood and affect are appropriate Heart: Regular rate and rhythm no rubs murmurs or extra sounds Lungs: Clear to auscultation, breathing unlabored, no rales or wheezes Abdomen: Positive bowel sounds, soft nontender to palpation, nondistended Extremities: No clubbing, cyanosis, or edema Skin: No evidence of breakdown, no evidence of rash Neurologic: Cranial nerves II through XII intact, motor strength is 5/5 in bilateral deltoid, bicep, tricep, grip, hip flexor, knee extensors, ankle dorsiflexor and plantar flexor Sensory exam normal sensation to light touch and proprioception in bilateral upper and lower extremities Cerebellar exam normal finger to nose to finger as well as heel to shin in bilateral upper and lower extremities Musculoskeletal: Full range of motion in all 4 extremities. No joint swelling   Assessment/Plan: 1. Functional deficits which require 3+ hours per day of interdisciplinary therapy in a comprehensive inpatient rehab setting.  Physiatrist is providing close  team supervision and 24 hour management of active medical problems listed below.  Physiatrist and rehab team continue to assess barriers to discharge/monitor patient progress toward functional and medical goals  Care Tool:  Bathing    Body parts bathed by patient: Right arm,Left arm,Chest,Abdomen,Front perineal area,Buttocks,Right upper leg,Left upper leg,Right lower leg,Face         Bathing assist Assist Level: Supervision/Verbal cueing     Upper Body Dressing/Undressing Upper body dressing   What is the patient wearing?: Pull over shirt    Upper body assist Assist Level: Set up assist    Lower Body Dressing/Undressing Lower body dressing      What is the patient wearing?: Underwear/pull up,Pants     Lower body assist Assist for lower body dressing: Minimal Assistance - Patient > 75%     Toileting Toileting    Toileting assist Assist for toileting: Independent with assistive device Assistive Device Comment: urinal   Transfers Chair/bed transfer  Transfers assist     Chair/bed transfer assist level: Minimal Assistance - Patient > 75%     Locomotion Ambulation   Ambulation assist      Assist level: Contact Guard/Touching assist Assistive device: No Device Max distance: 300   Walk 10 feet activity   Assist     Assist level: Contact Guard/Touching assist Assistive device: No Device   Walk 50 feet activity   Assist    Assist level: Contact Guard/Touching assist Assistive device: No  Device    Walk 150 feet activity   Assist    Assist level: Contact Guard/Touching assist Assistive device: No Device    Walk 10 feet on uneven surface  activity   Assist     Assist level: Contact Guard/Touching assist Assistive device: Other (comment) (intermittent HR use)   Wheelchair     Assist Will patient use wheelchair at discharge?: No             Wheelchair 50 feet with 2 turns activity    Assist             Wheelchair 150 feet activity     Assist          Blood pressure (!) 157/95, pulse 76, temperature (!) 97.5 F (36.4 C), temperature source Oral, resp. rate 18, SpO2 93 %.   Medical Problem List and Plan: 1.  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  Patient did receive TPA.  TEE showed small PFO without thrombus             -patient may shower             -ELOS/Goals: modI 1/18   2.  Antithrombotics: -DVT/anticoagulation: Lovenox 40 mg daily             -antiplatelet therapy: Aspirin 81 mg daily and Plavix 75 mg daily x3 weeks then aspirin alone 3. Pain Management: Lidoderm patch as directed, Robaxin 500 mg every 8 hours. Pain is well controlled, if continues to be well controlled with therapy, consider making Robaxin PRN.  4. Mood: Trazodone 50 mg nightly             -antipsychotic agents: N/A 5. Neuropsych: This patient is capable of making decisions on his own behalf. 6. Skin/Wound Care: Routine skin checks 7. Fluids/Electrolytes/Nutrition: Routine in and outs with follow-up chemistries 8.  Hypertension.  Labile, continue Norvasc 5 mg daily.  Monitor with increased mobility Vitals:   01/05/21 0514 01/05/21 0716  BP: (!) 161/96 (!) 157/95  Pulse: 72 76  Resp: 18   Temp: (!) 97.5 F (36.4 C)   SpO2: 98% 93%  systolic elevated will increase amlodipine in am   9.  Hyperlipidemia.  Lipitor 10.  Tobacco abuse.  NicoDerm patch.  Counseling 11.  Morbid obesity.  BMI 40.47.  Dietary follow-up 12. Bradycardia: monitor HR TID.  #13.  Small PFO not cause of CVA per Dr Roda Shutters, no need for closure, cancel cardiology consult  Pt  Current CVA c/w SVD, pt has risk factors of HTN and HLD  LOS: 3 days A FACE TO FACE EVALUATION WAS PERFORMED  Erick Colace 01/05/2021, 8:27 AM

## 2021-01-05 NOTE — Plan of Care (Signed)
  Problem: Consults Goal: RH STROKE PATIENT EDUCATION Description: See Patient Education module for education specifics  Outcome: Progressing   Problem: RH SAFETY Goal: RH STG ADHERE TO SAFETY PRECAUTIONS W/ASSISTANCE/DEVICE Description: STG Adhere to Safety Precautions With min Assistance/Device. Outcome: Progressing   Problem: RH KNOWLEDGE DEFICIT Goal: RH STG INCREASE KNOWLEDGE OF HYPERTENSION Description: Patient will be able to manage HTN using medications, dietary modification using handouts and educational materials independently.  Outcome: Progressing Goal: RH STG INCREASE KNOWLEGDE OF HYPERLIPIDEMIA Description: Patient will be able to manage HLD using medications, dietary modification using handouts and educational materials independently.  Outcome: Progressing Goal: RH STG INCREASE KNOWLEDGE OF STROKE PROPHYLAXIS Description: Patient will be able to manage secondary stroke risks using medications, dietary modification using handouts and educational materials independently.  Outcome: Progressing

## 2021-01-06 ENCOUNTER — Inpatient Hospital Stay (HOSPITAL_COMMUNITY): Payer: Self-pay | Admitting: Physical Therapy

## 2021-01-06 ENCOUNTER — Inpatient Hospital Stay (HOSPITAL_COMMUNITY): Payer: Self-pay

## 2021-01-06 NOTE — Progress Notes (Signed)
[place] PHYSICAL MEDICINE & REHABILITATION PROGRESS NOTE   Subjective/Complaints:  Has a skin area between toes, in therapy now an ddoes not wish to remove socks at this time  Per pt his Sister is concerned about diabetes- normal hgb A1C on admit, CBGs normal No numbness , tingling or burning pain in feet   Review of systems negative chest pain shortness of breath nausea vomiting diarrhea constipation  Objective:   No results found. No results for input(s): WBC, HGB, HCT, PLT in the last 72 hours. No results for input(s): NA, K, CL, CO2, GLUCOSE, BUN, CREATININE, CALCIUM in the last 72 hours.  Intake/Output Summary (Last 24 hours) at 01/06/2021 0808 Last data filed at 01/06/2021 0700 Gross per 24 hour  Intake 600 ml  Output 350 ml  Net 250 ml        Physical Exam: Vital Signs Blood pressure (!) 168/90, pulse 87, temperature 98.3 F (36.8 C), resp. rate 17, SpO2 96 %.    General: No acute distress Mood and affect are appropriate Heart: Regular rate and rhythm no rubs murmurs or extra sounds Lungs: Clear to auscultation, breathing unlabored, no rales or wheezes Abdomen: Positive bowel sounds, soft nontender to palpation, nondistended Extremities: No clubbing, cyanosis, or edema  Neurologic: Cranial nerves II through XII intact, motor strength is 5/5 in bilateral deltoid, bicep, tricep, grip, hip flexor, knee extensors, ankle dorsiflexor and plantar flexor Sensory exam normal sensation to light touchin bilateral upperextremities  Musculoskeletal: Full range of motion in all 4 extremities. No joint swelling    Assessment/Plan: 1. Functional deficits which require 3+ hours per day of interdisciplinary therapy in a comprehensive inpatient rehab setting.  Physiatrist is providing close team supervision and 24 hour management of active medical problems listed below.  Physiatrist and rehab team continue to assess barriers to discharge/monitor patient progress toward  functional and medical goals  Care Tool:  Bathing    Body parts bathed by patient: Right arm,Left arm,Chest,Abdomen,Front perineal area,Buttocks,Right upper leg,Left upper leg,Right lower leg,Face         Bathing assist Assist Level: Supervision/Verbal cueing     Upper Body Dressing/Undressing Upper body dressing   What is the patient wearing?: Pull over shirt    Upper body assist Assist Level: Set up assist    Lower Body Dressing/Undressing Lower body dressing      What is the patient wearing?: Underwear/pull up,Pants     Lower body assist Assist for lower body dressing: Minimal Assistance - Patient > 75%     Toileting Toileting    Toileting assist Assist for toileting: Independent with assistive device Assistive Device Comment: urinal   Transfers Chair/bed transfer  Transfers assist     Chair/bed transfer assist level: Contact Guard/Touching assist     Locomotion Ambulation   Ambulation assist      Assist level: Supervision/Verbal cueing Assistive device: Cane-straight Max distance: 250 ft   Walk 10 feet activity   Assist     Assist level: Supervision/Verbal cueing Assistive device: No Device   Walk 50 feet activity   Assist    Assist level: Supervision/Verbal cueing Assistive device: Cane-straight    Walk 150 feet activity   Assist    Assist level: Supervision/Verbal cueing Assistive device: Cane-straight    Walk 10 feet on uneven surface  activity   Assist     Assist level: Contact Guard/Touching assist Assistive device: Other (comment) (intermittent HR use)   Wheelchair     Assist Will patient use wheelchair at  discharge?: No             Wheelchair 50 feet with 2 turns activity    Assist            Wheelchair 150 feet activity     Assist          Blood pressure (!) 168/90, pulse 87, temperature 98.3 F (36.8 C), resp. rate 17, SpO2 96 %.   Medical Problem List and Plan: 1.   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  Patient did receive TPA.  TEE showed small PFO without thrombus             -patient may shower             -ELOS/Goals: modI 1/18   2.  Antithrombotics: -DVT/anticoagulation: Lovenox 40 mg daily             -antiplatelet therapy: Aspirin 81 mg daily and Plavix 75 mg daily x3 weeks then aspirin alone 3. Pain Management: Lidoderm patch as directed, Robaxin 500 mg every 8 hours. Pain is well controlled, if continues to be well controlled with therapy, consider making Robaxin PRN.  4. Mood: Trazodone 50 mg nightly             -antipsychotic agents: N/A 5. Neuropsych: This patient is capable of making decisions on his own behalf. 6. Skin/Wound Care: Routine skin checks 7. Fluids/Electrolytes/Nutrition: Routine in and outs with follow-up chemistries 8.  Hypertension.  Labile, continue Norvasc 5 mg daily.  Monitor with increased mobility Vitals:   01/05/21 1943 01/06/21 0538  BP: (!) 148/90 (!) 168/90  Pulse: 86 87  Resp: 18 17  Temp: 98 F (36.7 C) 98.3 F (36.8 C)  SpO2: 99% 96%  systolic elevated will increase amlodipine 1/14  9.  Hyperlipidemia.  Lipitor 10.  Tobacco abuse.  NicoDerm patch.  Counseling 11.  Morbid obesity.  BMI 40.47.  Dietary follow-up 12. Bradycardia: monitor HR TID.  #13.  Small PFO not cause of CVA per Dr Roda Shutters, no need for closure, cancel cardiology consult  Pt  Current CVA c/w SVD, pt has risk factors of HTN and HLD  LOS: 4 days A FACE TO FACE EVALUATION WAS PERFORMED  Erick Colace 01/06/2021, 8:08 AM

## 2021-01-06 NOTE — Plan of Care (Signed)
  Problem: Consults Goal: RH STROKE PATIENT EDUCATION Description: See Patient Education module for education specifics  Outcome: Progressing   Problem: RH SAFETY Goal: RH STG ADHERE TO SAFETY PRECAUTIONS W/ASSISTANCE/DEVICE Description: STG Adhere to Safety Precautions With mod I Assistance/Device. Outcome: Progressing   Problem: RH KNOWLEDGE DEFICIT Goal: RH STG INCREASE KNOWLEDGE OF HYPERTENSION Description: Patient will be able to manage HTN using medications, dietary modification using handouts and educational materials independently.  Outcome: Progressing Goal: RH STG INCREASE KNOWLEGDE OF HYPERLIPIDEMIA Description: Patient will be able to manage HLD using medications, dietary modification using handouts and educational materials independently.  Outcome: Progressing Goal: RH STG INCREASE KNOWLEDGE OF STROKE PROPHYLAXIS Description: Patient will be able to manage secondary stroke risks using medications, dietary modification using handouts and educational materials independently.  Outcome: Progressing

## 2021-01-06 NOTE — Progress Notes (Signed)
Physical Therapy Session Note  Patient Details  Name: Aaron Gibson MRN: [id number] Date of Birth: [birth year]  Today's Date: 01/06/2021 PT Individual Time: 1001-1100 PT Individual Time Calculation (min): 59 min   Short Term Goals: Week 1:  PT Short Term Goal 1 (Week 1): = to LTG based on ELOS  Skilled Therapeutic Interventions/Progress Updates:    Patient seated upright in w/c with RLE on bed surface upon PT arrival. Patient alert and agreeable to PT session. Patient denied pain throughout session.  Therapeutic Activity: Transfers: Patient performed sit to/from stand throughout session with supervision. VC initially to focus on equal weight distribution between BLE in order to promote increased LLE contribution, strength and coordination.   Gait Training:  Patient ambulated several bouts >250 feet using no AD with supervision. Ambulated with increased stance time over RLE and increased L lateral pelvic shift with stance. Provided verbal and visual cues for equalizing stance time bilaterally and reducing shift with pt able to improve.  Neuromuscular Re-ed: NMR facilitated during session with focus on dynamic standing balance, safety/ judgement with reaction time, and BLE coordination L>R. Pt guided in star drill activity requiring stepping to targets placed anteriorly and laterally as well as  diagonally to each side at 31ft distance away from pt. Called targets with pt reaching each target in 15 reps for 4 bouts. 97% accuracy demonstrated with only 2 missteps for balance and pt able to self-correct and maintain upright stance. Dynamic reaching activity with pt requiring one step out to reach targets placed from overhead to below knee height. Called targets singly up to multiple targets of up to 4 separate points with pt making judgement of safe foot to use for step with instructed hand to target. Pt demos good judgment and safe stepping/ reaching strategy throughout. Final activity requiring pt  to walk about large obstacles in day room and touching each toe gently to 6 placed cones in tighter spaces. Performed well with no LOB noted.   Therapeutic Exercise: Within // bars for add'l support if needed, pt performed walking forward lunges with return to "ready position" between each lunge. Initally requiring BUE support in order to maintain balance and challenge proprioception and strength. Improves throughout with no UE support and large steps forward. Minimal, intermittent wobbles in balance with no LOB noted. Side stepping activity performed with pt instructed to remain in "ready position" throughout and focus with each step to soft placement of LLE in stepping out laterally to clearing foot with increased knee flexion when returning step medially. Challenged pt with progression to use of Level 2 theraband placed just above knees and pt able to control stepping pattern in each direction. Pt states he was challenged in all exercises this day but enjoys feeling accomplished and feels as though he is getting stronger every day.   Patient seated in w/c at end of session with brakes locked and all needs within reach.    Therapy Documentation Precautions:  Precautions Precautions: Fall,Other (comment) Precaution Comments: L sided weakness Restrictions Weight Bearing Restrictions: No   Therapy/Group: Individual Therapy  Loel Dubonnet 01/06/2021, 4:17 PM

## 2021-01-06 NOTE — IPOC Note (Signed)
Overall Plan of Care [facility]) Patient Details Name: Kawika Bischoff MRN: [id number] DOB: [birth year]  Admitting Diagnosis: Cerebrovascular accident (CVA) of right basal ganglia [facility])  Hospital Problems: Principal Problem:   Cerebrovascular accident (CVA) of right basal ganglia (HCC)     Functional Problem List: Nursing Endurance,Medication Management,Safety  PT Balance,Nutrition,Edema,Endurance,Motor,Sensory,Safety,Perception,Pain  OT Balance,Endurance,Motor,Sensory  SLP    TR         Basic ADL's: OT Grooming,Bathing,Dressing,Toileting     Advanced  ADL's: OT Simple Meal Preparation,Light Housekeeping,Laundry     Transfers: PT Bed Mobility,Bed to [employer]  OT Tub/Shower     Locomotion: PT Ambulation,Stairs     Additional Impairments: OT Fuctional Use of Upper Extremity  SLP        TR      Anticipated Outcomes Item Anticipated Outcome  Self Feeding independent  Swallowing      Basic self-care  Mod I  Toileting  Mod I   Bathroom Transfers Mod I  Bowel/Bladder  n/a  Transfers  Mod I with LRAD  Locomotion  Supervision with LRAD  Communication     Cognition     Pain  n/a  Safety/Judgment  maintain safety with cues/reminders   Therapy Plan: PT Intensity: Minimum of 1-2 x/day ,45 to 90 minutes PT Frequency: 5 out of 7 days PT Duration Estimated Length of Stay: 7-10 days OT Intensity: Minimum of 1-2 x/day, 45 to 90 minutes OT Duration/Estimated Length of Stay: 9 days     Due to the current state of emergency, patients may not be receiving their 3-hours of Medicare-mandated therapy.   Team Interventions: Nursing Interventions Patient/Family Education,Disease Management/Prevention,Medication Management,Discharge Planning  PT interventions Ambulation/gait training,Balance/vestibular training,Discharge planning,Community reintegration,Disease Social research officer, government  re-education,Functional mobility training,Pain management,Patient/family education,Psychosocial support,Stair training,Therapeutic Activities,Splinting/orthotics,Therapeutic Exercise,UE/LE Strength taining/ROM,UE/LE Coordination activities,Visual/perceptual remediation/compensation  OT Interventions Balance/vestibular training,Discharge planning,Functional mobility training,DME/adaptive equipment instruction,Neuromuscular re-education,Patient/family education,Psychosocial support,Therapeutic Activities,Self Care/advanced ADL retraining,Therapeutic Exercise,UE/LE Strength taining/ROM,UE/LE Coordination activities  SLP Interventions    TR Interventions    SW/CM Interventions Discharge Planning,Psychosocial Support,Patient/Family Education   Barriers to Discharge MD  Medical stability  Nursing Decreased caregiver support,Home environment access/layout    PT Home environment access/layout,Decreased caregiver support    OT      SLP      SW Insurance for SNF coverage Patient is UNINSURED   Team Discharge Planning: Destination: PT-Home ,OT- Home , SLP-  Projected Follow-up: PT-Outpatient PT,24 hour supervision/assistance, OT-  Outpatient OT, SLP-  Projected Equipment Needs: PT-To be determined, OT- Tub/shower seat, SLP-  Equipment Details: PT- , OT-  Patient/family involved in discharge planning: PT- Patient,  OT-Patient, SLP-   MD ELOS: 10-14d Medical Rehab Prognosis:  Good Assessment:  55 year old right-handed male with history of untreated hypertension, chronic back pain, morbid obesity with BMI of 40.47, tobacco abuse.  Per chart review patient lives alone.  Independent prior to admission.  Two-level home bed and bath upstairs.  He does have family in the area.  Presented 12/28/2020 with left-sided weakness and slurred speech.  Cranial CT scan negative for acute changes.  CT angiogram of head and neck with no occlusion or significant stenosis.  Patient did receive TPA.  MRI shows acute infarct  within the posterior putamen, posterior body of the caudate and radiating white matter tracts on the right.  No hemorrhage or mass-effect.  EEG negative for seizure.  Echocardiogram with ejection fraction of 65 to 70% no wall motion abnormalities.  TEE showed ejection fraction of 60 to 65% no thrombus small PFO.  Admission chemistries were unremarkable except glucose 194 hemoglobin A1c 6.5 troponin negative.  Currently maintained on aspirin and Plavix for CVA prophylaxis x3 weeks and aspirin alone.  Subcutaneous Lovenox for DVT prophylaxis.  Due to patient's left sided weakness, slurred speech, and decrease in functional mobility    Now requiring 24/7 Rehab RN,MD, as well as CIR level PT, OT and SLP.  Treatment team will focus on ADLs and mobility with goals set at Mod I  See Team Conference Notes for weekly updates to the plan of care

## 2021-01-06 NOTE — Progress Notes (Signed)
Physical Therapy Session Note  Patient Details  Name: Aaron Gibson MRN: [id number] Date of Birth: [birth year]  Today's Date: 01/06/2021 PT Individual Time: 1335-1435 PT Individual Time Calculation (min): 60 min   Short Term Goals: Week 1:  PT Short Term Goal 1 (Week 1): = to LTG based on ELOS  Skilled Therapeutic Interventions/Progress Updates:   Pt received sitting in WC and agreeable to PT. Gait training through rehab unit x 125f, 1571f and [id number] AD and distant supervision assist from PT. Min cues for step length symmetry R and L, but no foot drag noted this PM. PT treatment focused on dynamic balance training:  Standing on airex pad, eyes open SLS 3 x 15 sec BLE; eyes open/eyes closed wide BOS x 30sec and narrow BOS 2 x 30 sec. Min assist for SLS and CGA for all others.  Standing on blue wedge while performing cognitive task of Connect four x 3 bouts with supervision assist form PT with cues for use of ankle strategy to prevent posterior LOB  Biodex LOS with UE x 1 and without UE x 2, weight shift 1 min AP and Lateral control without UE support, and maze control x 1 with UE support and x 1 without.  Standing on Bosu ball AP and Lateral weight shift 2 x 1 min each with UE support on first bout and finger tip support for second. Supervision assist throughout with min assist intermittently to prevent lateral LOB to the R.   Patient returned to room and [facility] and all needs met.         Therapy Documentation Precautions:  Precautions Precautions: Fall,Other (comment) Precaution Comments: L sided weakness Restrictions Weight Bearing Restrictions: No Vital Signs: Therapy Vitals Temp: 97.7 F (36.5 C) Temp Source: Oral Pulse Rate: 83 Resp: 16 BP: (!) 151/81 Patient Position (if appropriate): Sitting Oxygen Therapy SpO2: 99 % O2 Device: Room Air Pain: Pain Assessment Pain Scale: 0-10 Pain Score: 0-No pain    Therapy/Group:  Individual Therapy  AusLorie Phenix14/2022, 2:38 PM

## 2021-01-06 NOTE — Progress Notes (Signed)
Occupational Therapy Session Note  Patient Details  Name: Aaron Gibson MRN: [id number] Date of Birth: 12/11/1966  Today's Date: 01/06/2021 OT Individual Time: 7829-5621 OT Individual Time Calculation (min): 55 min    Short Term Goals: Week 1:  OT Short Term Goal 1 (Week 1): STGs = LTGs  Skilled Therapeutic Interventions/Progress Updates:     Pt received in w/c, agreeable to therapy. Session focus on LUE NMR in prep for improved ADL/IADL performance. Sit > stand with SPC + distant S. Amb to gym with SPC + close S. No noted LOB. Completed 3 trials of dynamometer to measure B grip strength with the following results:  R hand: 105, 95, 110 with 103 lb average  L hand: 60, 65, 80  with 68 lb average  Demonstrating significant improvement in L grip strength since day of eval, when L grip recorded at 30 lbs. Placed and removed resistive clothes pins on horizontal bar from least to most resistive using L three jaw chuck grasp.   Completed the following exercises 3x10 with 5 lb dumb bell, orange level 2 resistance theraband, and 6 lb medicine ball with LUE: forearm pronation/supination, bicep curls, B shoulder flexion, L elbow extension, overhead tricep extension.  Completed 1x10 of the following with pink medium resistance theraputty with L hand: putty squeezes, digit extension, lumbrical flexion, and digit abduction. Amb back to room same manner as before. Pt left seated in w/c, call bell in reach, and all immediate needs met. Pt cleared on safety plan for ind in room at w/c level.   Therapy Documentation Precautions:  Precautions Precautions: Fall,Other (comment) Precaution Comments: L sided weakness Restrictions Weight Bearing Restrictions: No Pain: Pain Assessment Pain Scale: 0-10 Pain Score: 0-No pain ADL: See Care Tool for more details.   Therapy/Group: Individual Therapy  Volanda Napoleon, MS, OTR/L 01/06/2021, 11:32 AM

## 2021-01-06 NOTE — Discharge Summary (Signed)
Occupational Therapy Discharge Summary  Patient Details  Name: Aaron Gibson MRN: [id number] Date of Birth: 16-Jul-1966   Patient has met 12 of 12 long term goals due to improved activity tolerance, improved balance, postural control, ability to compensate for deficits, functional use of  LEFT upper extremity and improved coordination. Pt cont to demonstrate mild LUE hemiparesis on his nondominant side. Patient to discharge at overall Modified Independent level.  Patient's care partner is independent to provide the necessary physical assistance at discharge.   Reasons goals not met: NA  Recommendation:  Patient will benefit from ongoing skilled OT services in outpatient setting to continue to advance functional skills in the area of BADL, iADL and Vocation.  Equipment: shower chair  Reasons for discharge: treatment goals met and discharge from hospital  Patient/family agrees with progress made and goals achieved: Yes  OT Discharge Precautions/Restrictions  Precautions Precautions: Fall;Other (comment) Precaution Comments: L sided weakness Restrictions Weight Bearing Restrictions: No  Pain Pain Assessment Pain Scale: 0-10 Pain Score: 0-No pain ADL ADL Eating: Independent Grooming: Modified independent Where Assessed-Grooming: Standing at sink Upper Body Bathing: Modified independent Where Assessed-Upper Body Bathing: Shower Lower Body Bathing: Modified independent Where Assessed-Lower Body Bathing: Shower Upper Body Dressing: Modified independent (Device) Where Assessed-Upper Body Dressing: Chair Lower Body Dressing: Modified independent Where Assessed-Lower Body Dressing: Chair Toileting: Modified independent Where Assessed-Toileting: Glass blower/designer: Diplomatic Services operational officer Method: Clinical biochemist Method: Heritage manager: Civil engineer, contracting with back Vision Baseline  Vision/History: Wears glasses Wears Glasses: Distance only Patient Visual Report: No change from baseline Vision Assessment?: No apparent visual deficits Eye Alignment: Within Functional Limits Alignment/Gaze Preference: Within Defined Limits Tracking/Visual Pursuits: Able to track stimulus in all quads without difficulty Saccades: Within functional limits Visual Fields: No apparent deficits Perception  Perception: Within Functional Limits Praxis Praxis: Intact Cognition Overall Cognitive Status: Within Functional Limits for tasks assessed Arousal/Alertness: Awake/alert Orientation Level: Oriented X4 Attention: Alternating Alternating Attention: Appears intact Memory: Appears intact Safety/Judgment: Appears intact Sensation Sensation Light Touch: Appears Intact Hot/Cold: Appears Intact Proprioception: Appears Intact Stereognosis: Appears Intact (WFL) Coordination Gross Motor Movements are Fluid and Coordinated: Yes Fine Motor Movements are Fluid and Coordinated: Yes Coordination and Movement Description: [facility] WFL for ADL/IADL tasks Motor  Motor Motor: Hemiplegia Motor - Discharge Observations: mild L hemipareisis, UE>LE Mobility  Transfers Sit to Stand: Independent with assistive device Stand to Sit: Independent with assistive device  Trunk/Postural Assessment  Cervical Assessment Cervical Assessment: Within Functional Limits Thoracic Assessment Thoracic Assessment: Within Functional Limits Lumbar Assessment Lumbar Assessment: Exceptions to [facility] (mild anterior pelvic tilt) Postural Control Postural Control: Within Functional Limits  Balance Balance Balance Assessed: Yes Dynamic Sitting Balance Dynamic Sitting - Balance Support: Feet unsupported Dynamic Sitting - Level of Assistance: 6: Modified independent (Device/Increase time) Static Standing Balance Static Standing - Balance Support: Right upper extremity supported Static Standing - Level of Assistance: 6:  Modified independent (Device/Increase time) Dynamic Standing Balance Dynamic Standing - Balance Support: Right upper extremity supported Dynamic Standing - Level of Assistance: 6: Modified independent (Device/Increase time) Extremity/Trunk Assessment RUE Assessment RUE Assessment: Within Functional Limits General Strength Comments: grasp 103 lbs, 5/5 strength RUE LUE Assessment LUE Assessment: Exceptions to [facility] Passive Range of Motion (PROM) Comments: WFL Active Range of Motion (AROM) Comments: WFL but difficult and takes pt extra time to achieve full overhead shoulder ROM General Strength Comments: 68 lb grasp, 4/5 shoulder flexion LUE Body System: Neuro Brunstrum levels for arm  and hand: Arm;Hand Brunstrum level for arm: Stage V Relative Independence from Synergy Brunstrum level for hand: Stage VI Isolated joint movements   Volanda Napoleon, MS, OTR/L 01/06/2021, 12:52 PM

## 2021-01-07 MED ORDER — METHOCARBAMOL 500 MG PO TABS
500.0000 mg | ORAL_TABLET | Freq: Two times a day (BID) | ORAL | Status: DC
  Administered 2021-01-08: 500 mg via ORAL
  Filled 2021-01-07: qty 1

## 2021-01-07 NOTE — Progress Notes (Signed)
[place] PHYSICAL MEDICINE & REHABILITATION PROGRESS NOTE   Subjective/Complaints: No complaints, sleepy  Review of systems negative chest pain shortness of breath nausea vomiting diarrhea constipation, +fatigue  Objective:   No results found. No results for input(s): WBC, HGB, HCT, PLT in the last 72 hours. No results for input(s): NA, K, CL, CO2, GLUCOSE, BUN, CREATININE, CALCIUM in the last 72 hours.  Intake/Output Summary (Last 24 hours) at 01/07/2021 1957 Last data filed at 01/07/2021 1846 Gross per 24 hour  Intake 320 ml  Output 750 ml  Net -430 ml        Physical Exam: Vital Signs Blood pressure 140/90, pulse 71, temperature 98.1 F (36.7 C), resp. rate 18, height 5\' 9"  (1.753 m), SpO2 96 %.  Gen: no distress, normal appearing HEENT: oral mucosa pink and moist, NCAT Cardio: Reg rate Chest: normal effort, normal rate of breathing Abd: soft, non-distended Ext: no edema Psych: pleasant, normal affect Skin: intact  Neurologic: Cranial nerves II through XII intact, motor strength is 5/5 in bilateral deltoid, bicep, tricep, grip, hip flexor, knee extensors, ankle dorsiflexor and plantar flexor Sensory exam normal sensation to light touchin bilateral upperextremities  Musculoskeletal: Full range of motion in all 4 extremities. No joint swelling    Assessment/Plan: 1. Functional deficits which require 3+ hours per day of interdisciplinary therapy in a comprehensive inpatient rehab setting.  Physiatrist is providing close team supervision and 24 hour management of active medical problems listed below.  Physiatrist and rehab team continue to assess barriers to discharge/monitor patient progress toward functional and medical goals  Care Tool:  Bathing    Body parts bathed by patient: Right arm,Left arm,Chest,Abdomen,Front perineal area,Buttocks,Right upper leg,Left upper leg,Right lower leg,Left lower leg,Face         Bathing assist Assist Level: Independent  with assistive device     Upper Body Dressing/Undressing Upper body dressing   What is the patient wearing?: Pull over shirt    Upper body assist Assist Level: Independent with assistive device    Lower Body Dressing/Undressing Lower body dressing      What is the patient wearing?: Underwear/pull up,Pants     Lower body assist Assist for lower body dressing: Independent with assitive device     Toileting Toileting    Toileting assist Assist for toileting: Independent with assistive device Assistive Device Comment: urinal   Transfers Chair/bed transfer  Transfers assist     Chair/bed transfer assist level: Supervision/Verbal cueing     Locomotion Ambulation   Ambulation assist      Assist level: Supervision/Verbal cueing Assistive device: No Device Max distance: 300 ft   Walk 10 feet activity   Assist     Assist level: Supervision/Verbal cueing Assistive device: No Device   Walk 50 feet activity   Assist    Assist level: Supervision/Verbal cueing Assistive device: No Device    Walk 150 feet activity   Assist    Assist level: Supervision/Verbal cueing Assistive device: No Device    Walk 10 feet on uneven surface  activity   Assist     Assist level: Contact Guard/Touching assist Assistive device: Other (comment) (intermittent HR use)   Wheelchair     Assist Will patient use wheelchair at discharge?: No             Wheelchair 50 feet with 2 turns activity    Assist            Wheelchair 150 feet activity     Assist  Blood pressure 140/90, pulse 71, temperature 98.1 F (36.7 C), resp. rate 18, height 5\' 9"  (1.753 m), SpO2 96 %.   Medical Problem List and Plan: 1.  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  Patient did receive TPA.  TEE showed small PFO without thrombus             -patient may shower             -ELOS/Goals:  modI 1/18  -Continue CIR 2.  Antithrombotics: -DVT/anticoagulation: Lovenox 40 mg daily             -antiplatelet therapy: Aspirin 81 mg daily and Plavix 75 mg daily x3 weeks then aspirin alone 3. Pain Management: Lidoderm patch as directed, Robaxin 500 mg every 8 hours. Pain is well controlled, if continues to be well controlled with therapy, consider making Robaxin PRN.   1/15: denies pain: decrease robaxin to BID PRN.  4. Mood: Trazodone 50 mg nightly. Ambien also added for insomnia.              -antipsychotic agents: N/A 5. Neuropsych: This patient is capable of making decisions on his own behalf. 6. Skin/Wound Care: Routine skin checks 7. Fluids/Electrolytes/Nutrition: Routine in and outs with follow-up chemistries 8.  Hypertension.  Labile, continue Norvasc 5 mg daily.  Monitor with increased mobility Vitals:   01/07/21 0458 01/07/21 1521  BP: (!) 161/98 140/90  Pulse: 74 71  Resp: 14 18  Temp: 98.2 F (36.8 C) 98.1 F (36.7 C)  SpO2: 99% 96%  systolic elevated will increase amlodipine 1/14  1/15: BP continues to be elevated, monitor for effects of yesterday's change  9.  Hyperlipidemia.  Lipitor 10.  Tobacco abuse.  NicoDerm patch.  Counseling 11.  Morbid obesity.  BMI 40.47.  Dietary follow-up 12. Bradycardia: resolved, monitor HR TID.  #13.  Small PFO not cause of CVA per Dr 2/15, no need for closure, cancel cardiology consult  Pt  Current CVA c/w SVD, pt has risk factors of HTN and HLD  LOS: 5 days A FACE TO FACE EVALUATION WAS PERFORMED  Roda Shutters Verner Mccrone 01/07/2021, 7:57 PM

## 2021-01-08 ENCOUNTER — Inpatient Hospital Stay (HOSPITAL_COMMUNITY): Payer: Self-pay

## 2021-01-08 ENCOUNTER — Inpatient Hospital Stay (HOSPITAL_COMMUNITY): Payer: Self-pay | Admitting: Physical Therapy

## 2021-01-08 MED ORDER — AMLODIPINE BESYLATE 10 MG PO TABS
10.0000 mg | ORAL_TABLET | Freq: Every day | ORAL | Status: DC
  Administered 2021-01-09 – 2021-01-10 (×2): 10 mg via ORAL
  Filled 2021-01-08 (×2): qty 1

## 2021-01-08 MED ORDER — TRAZODONE HCL 50 MG PO TABS
100.0000 mg | ORAL_TABLET | Freq: Every evening | ORAL | Status: DC | PRN
  Administered 2021-01-08 – 2021-01-09 (×2): 100 mg via ORAL
  Filled 2021-01-08 (×2): qty 2

## 2021-01-08 MED ORDER — METHOCARBAMOL 500 MG PO TABS
500.0000 mg | ORAL_TABLET | Freq: Every day | ORAL | Status: DC
  Administered 2021-01-09: 500 mg via ORAL
  Filled 2021-01-08: qty 1

## 2021-01-08 NOTE — Progress Notes (Signed)
PHYSICAL MEDICINE & REHABILITATION PROGRESS NOTE   Subjective/Complaints: Patient reports poor sleep despite Ambien. Discussed trying higher dose of Trazodone and he is agreeable. No other complaints.  Review of systems negative chest pain shortness of breath nausea vomiting diarrhea constipation, +fatigue, insomnia.  Objective:   No results found. No results for input(s): WBC, HGB, HCT, PLT in the last 72 hours. No results for input(s): NA, K, CL, CO2, GLUCOSE, BUN, CREATININE, CALCIUM in the last 72 hours.  Intake/Output Summary (Last 24 hours) at 01/08/2021 1504 Last data filed at 01/08/2021 1419 Gross per 24 hour  Intake 600 ml  Output 275 ml  Net 325 ml        Physical Exam: Vital Signs Blood pressure (!) 150/90, pulse 76, temperature 98.1 F (36.7 C), temperature source Oral, resp. rate 15, height 5\' 9"  (1.753 m), SpO2 99 %.  Gen: no distress, normal appearing HEENT: oral mucosa pink and moist, NCAT Cardio: Reg rate Chest: normal effort, normal rate of breathing Abd: soft, non-distended Ext: no edema Psych: pleasant, normal affect  Neurologic: Cranial nerves II through XII intact, motor strength is 5/5 in bilateral deltoid, bicep, tricep, grip, hip flexor, knee extensors, ankle dorsiflexor and plantar flexor Sensory exam normal sensation to light touchin bilateral upperextremities  Musculoskeletal: Full range of motion in all 4 extremities. No joint swelling    Assessment/Plan: 1. Functional deficits which require 3+ hours per day of interdisciplinary therapy in a comprehensive inpatient rehab setting.  Physiatrist is providing close team supervision and 24 hour management of active medical problems listed below.  Physiatrist and rehab team continue to assess barriers to discharge/monitor patient progress toward functional and medical goals  Care Tool:  Bathing    Body parts bathed by patient: Right arm,Left arm,Chest,Abdomen,Front perineal  area,Buttocks,Right upper leg,Left upper leg,Right lower leg,Left lower leg,Face         Bathing assist Assist Level: Independent with assistive device     Upper Body Dressing/Undressing Upper body dressing   What is the patient wearing?: Pull over shirt    Upper body assist Assist Level: Independent with assistive device    Lower Body Dressing/Undressing Lower body dressing      What is the patient wearing?: Underwear/pull up,Pants     Lower body assist Assist for lower body dressing: Independent with assitive device     Toileting Toileting    Toileting assist Assist for toileting: Independent with assistive device Assistive Device Comment: urinal   Transfers Chair/bed transfer  Transfers assist     Chair/bed transfer assist level: Supervision/Verbal cueing     Locomotion Ambulation   Ambulation assist      Assist level: Supervision/Verbal cueing Assistive device: No Device Max distance: 150 ft   Walk 10 feet activity   Assist     Assist level: Supervision/Verbal cueing Assistive device: No Device   Walk 50 feet activity   Assist    Assist level: Supervision/Verbal cueing Assistive device: No Device    Walk 150 feet activity   Assist    Assist level: Supervision/Verbal cueing Assistive device: No Device    Walk 10 feet on uneven surface  activity   Assist     Assist level: Contact Guard/Touching assist Assistive device: Other (comment) (intermittent HR use)   Wheelchair     Assist Will patient use wheelchair at discharge?: No             Wheelchair 50 feet with 2 turns activity    Assist  Wheelchair 150 feet activity     Assist          Blood pressure (!) 150/90, pulse 76, temperature 98.1 F (36.7 C), temperature source Oral, resp. rate 15, height 5\' 9"  (1.753 m), SpO2 99 %.   Medical Problem List and Plan: 1.  Left-sided weakness with dysarthria secondary to right basal ganglia  infarct likely small vessel disease.  Patient did receive TPA.  TEE showed small PFO without thrombus             -patient may shower             -ELOS/Goals: modI 1/18  -Continue CIR 2.  Antithrombotics: -DVT/anticoagulation: Lovenox 40 mg daily             -antiplatelet therapy: Aspirin 81 mg daily and Plavix 75 mg daily x3 weeks then aspirin alone 3. Pain Management: Lidoderm patch as directed, Robaxin 500 mg every 8 hours. Pain is well controlled, if continues to be well controlled with therapy, consider making Robaxin PRN.   1/15: denies pain: decrease robaxin to BID PRN.   1/16: denies pain, decrease Robaxin to HS. 4. Mood: Trazodone 50 mg nightly. Ineffective, increased to 100mg .             -antipsychotic agents: N/A 5. Neuropsych: This patient is capable of making decisions on his own behalf. 6. Skin/Wound Care: Routine skin checks 7. Fluids/Electrolytes/Nutrition: Routine in and outs with follow-up chemistries 8.  Hypertension.  Labile, continue Norvasc 5 mg daily.  Monitor with increased mobility  1/16: elevated: increase Norvasc to 10mg . Vitals:   01/08/21 0523 01/08/21 1417  BP: (!) 157/95 (!) 150/90  Pulse: 71 76  Resp: 18 15  Temp: 98.5 F (36.9 C) 98.1 F (36.7 C)  SpO2: 98% 99%   9.  Hyperlipidemia.  Lipitor 10.  Tobacco abuse.  NicoDerm patch.  Counseling 11.  Morbid obesity.  BMI 40.47.  Dietary follow-up 12. Bradycardia: resolved, monitor HR TID.  #13.  Small PFO not cause of CVA per Dr , no need for closure, cancel cardiology consult  Pt  Current CVA c/w SVD, pt has risk factors of HTN and HLD  LOS: 6 days A FACE TO FACE EVALUATION WAS PERFORMED  01/10/21 P Shabana Armentrout 01/08/2021, 3:04 PM

## 2021-01-08 NOTE — Progress Notes (Signed)
Occupational Therapy Session Note  Patient Details  Name: Aaron Gibson MRN: [id number] Date of Birth: Aug 09, 1966  Today's Date: 01/08/2021 OT Individual Time: 1100-1200 OT Individual Time Calculation (min): 60 min    Short Term Goals: Week 1:  OT Short Term Goal 1 (Week 1): STGs = LTGs  Skilled Therapeutic Interventions/Progress Updates:    Pt resting in bed upon arrival and agreeable to getting OOB for therapy. OT intervention with focus on functional amb with/without SPC, standing balance, LUE strengthening, LUE gross motor and FMC tasks, and safety awareness to increase independence with BADLs. Activities/tasks included carrying towels in LUE while ambulating in hallway, BITS activites in standing using LUE only, boxing in standing, LUE therex on arm bike (3.5 mins in each direction, random load 5), peg board tasks using LUE only, and 9 hole peg test (R=26.2". L=49.2"). No LOB noted during all activites. Pt with 4/5 L shoulder strength. Pt returned to room and remained seated in w/c with all needs within reach.   Therapy Documentation Precautions:  Precautions Precautions: Fall,Other (comment) Precaution Comments: L sided weakness Restrictions Weight Bearing Restrictions: No  Pain:   Pt denies pain this morning  Therapy/Group: Individual Therapy  Rich Brave 01/08/2021, 12:05 PM

## 2021-01-08 NOTE — Progress Notes (Signed)
Physical Therapy Session Note  Patient Details  Name: Aaron Gibson MRN: [id number] Date of Birth: 1966-11-07  Today's Date: 01/08/2021 PT Individual Time: 0800-0915 PT Individual Time Calculation (min): 75 min   Short Term Goals: Week 1:  PT Short Term Goal 1 (Week 1): = to LTG based on ELOS  Skilled Therapeutic Interventions/Progress Updates:    Session 1: Pt was received reclined in bed. Pt supervision for dressing with increased time. Pt ambulated with and without AD with with supervision, 150 ft x2.He performed side stepping cone taps x4 laps with minimal difficulty, SBA. He was able to bend down to pick up cones without LOB, SBA. Pt demonstrated modI with bed mobility and low, soft furniture in rehab apartment. Pt performed 3 versions of an obstacle course with CGA, including 1-2 6 in steps, weaving through cones, cone taps and walking on wedges. He was challenged with side steps and monster walks due to muscle fatigue, performed with SBA. Pt stated difficulty with fine motor tasks. He was able to reach outside BOS to place clothes pins on basketball net with CGA while on foam pad, as well as using the pegboard while standing on a foam pad with CGA. Pt was left seated in recliner with needs in reach.   Therapy Documentation Precautions:  Precautions Precautions: Fall,Other (comment) Precaution Comments: L sided weakness Restrictions Weight Bearing Restrictions: No    Therapy/Group: Individual Therapy  Dyane Dustman, SPT 01/08/2021, 10:22 AM

## 2021-01-08 NOTE — Progress Notes (Signed)
Patient noted in the restroom at the beginning of the shift, family at bedside & assisting patient with his needs. When this nurse returned, patient was still with family & c/o no pain. Returned to give patient his medication. He refused the mucinex stating that he did not need it. No congestion or cough noted. Medication was tolerated well. He was cooperative. Lidoderm patch applied to the right scapula area. He has what looks like a dark, old rash to the same are as the lidoderm patch. He is ambulatory & can make his needs known. Follows commands & movement & strength seems to be almost equal; in all extremities. No acute distress noted. Will continue to monitor for changes.

## 2021-01-08 NOTE — Discharge Summary (Signed)
Physician Discharge Summary  Patient ID: Aaron Gibson Nomura MRN: [id number] DOB/AGE: 53-Nov-1967 53 y.o.  Admit date: 01/02/2021 Discharge date: 01/10/2021  Discharge Diagnoses:  Principal Problem:   Cerebrovascular accident (CVA) of right basal ganglia (HCC) DVT prophylaxis Sleep disturbance Hypertension Hyperlipidemia Tobacco abuse Morbid obesity Small PFO Chronic back pain  Discharged Condition: Stable  Significant Diagnostic Studies: EEG  Result Date: 12/29/2020 Charlsie QuestYadav, Priyanka O, MD     12/29/2020 10:46 AM Patient Name: Aaron Gibson Hughlett MRN: [id number] Epilepsy Attending: Charlsie QuestPriyanka O Yadav Referring Physician/Provider: Dr Erick BlinksSalman Khaliqdina Date: 12/28/2020 Duration: 24.25 mins Patient history: 55 year old male with waxing and waning weakness in left upper extremity and left leg.  EEG to evaluate for seizures. Level of alertness: Awake, asleep AEDs during EEG study: None Technical aspects: This EEG study was done with scalp electrodes positioned according to the 10-20 International system of electrode placement. Electrical activity was acquired at a sampling rate of 500Hz  and reviewed with a high frequency filter of 70Hz  and a low frequency filter of 1Hz . EEG data were recorded continuously and digitally stored. Description: The posterior dominant rhythm consists of 10 Hz activity of moderate voltage (25-35 uV) seen predominantly in posterior head regions, symmetric and reactive to eye opening and eye closing. Sleep was characterized by vertex waves, sleep spindles (12 to 14 Hz), maximal frontocentral region. Hyperventilation and photic stimulation were not performed. IMPRESSION: This study is within normal limits. No seizures or epileptiform discharges were seen throughout the recording. Charlsie QuestPriyanka O Yadav   CT HEAD WO CONTRAST  Result Date: 12/29/2020 CLINICAL DATA:  Stroke, 24 hours post tPA, follow-up EXAM: CT HEAD WITHOUT CONTRAST TECHNIQUE: Contiguous axial images were obtained from the base of the  skull through the vertex without intravenous contrast. COMPARISON:  the previous day's study FINDINGS: Brain: Progressive hypoattenuation in the right basal ganglia infarct. No acute hemorrhage. No new mass effect or midline shift. No hydrocephalus. No new lesion identified. Vascular: No hyperdense vessel or unexpected calcification. Skull: No fracture or other focal lesion. Sinuses/Orbits: Mucosal thickening in maxillary sinuses. Partial opacification of left frontal sinus and bilateral ethmoid air cells. Debris in the sphenoid sinus. Other: None IMPRESSION: Progressive hypoattenuation in the non-hemorrhagic right basal ganglia infarct. No acute findings. Electronically Signed   By: Corlis Leak  Hassell M.D.   On: 12/29/2020 15:01   MR BRAIN WO CONTRAST  Result Date: 12/28/2020 CLINICAL DATA:  Stroke presentation.  Left-sided deficit. EXAM: MRI HEAD WITHOUT CONTRAST TECHNIQUE: Multiplanar, multiecho pulse sequences of the brain and surrounding structures were obtained without intravenous contrast. COMPARISON:  CT studies earlier same day FINDINGS: Brain: Diffusion imaging shows hyperacute infarction within the posterior putamen, posterior body of the caudate and radiating white matter tracts on the right. Early T2 signal abnormality. No mass effect or hemorrhage. The remainder the brain appears normal without evidence of prior insult. No hydrocephalus or extra-axial collection. Vascular: Major vessels at the base of the brain show flow. Skull and upper cervical spine: Negative Sinuses/Orbits: Mucosal inflammatory changes of the paranasal sinuses, which are small on a developmental basis. Orbits negative. Other: None IMPRESSION: 1. Acute infarction within the posterior putamen, posterior body of the caudate and radiating white matter tracts on the right. No hemorrhage or mass effect. The remainder the brain appears normal without evidence of prior insult. 2. Mucosal inflammatory changes of the paranasal sinuses, which are  small on a developmental basis. Electronically Signed   By: Paulina FusiMark  Shogry M.D.   On: 12/28/2020 11:23   DG Chest Portable 1 View  Result Date: 12/28/2020 CLINICAL DATA:  Chest pain. EXAM: PORTABLE CHEST 1 VIEW COMPARISON:  Chest x-ray 09/05/2018. FINDINGS: Mediastinum and hilar structures normal. Heart size normal. Low lung volumes with mild bibasilar atelectasis. No pleural effusion or pneumothorax. IMPRESSION: Low lung volumes with mild bibasilar atelectasis. Electronically Signed   By: Maisie Fus  Register   On: 12/28/2020 13:46   ECHO TEE  Result Date: 01/02/2021    TRANSESOPHOGEAL ECHO REPORT   Patient Name:   ALIC HILBURN Date of Exam: 01/02/2021 Medical Rec #:  [id number]     Height:       69.0 in Accession #:    [id number]    Weight:       274.1 lb Date of Birth:  02-14-1966     BSA:          2.362 m Patient Age:    54 years      BP:           134/73 mmHg Patient Gender: M             HR:           80 bpm. Exam Location:  Inpatient Procedure: Transesophageal Echo, Cardiac Doppler and Color Doppler Indications:    CVA  History:        Patient has prior history of Echocardiogram examinations, most                 recent 12/29/2020. Stroke; Risk Factors:Hypertension,                 Dyslipidemia, Current Smoker, Diabetes and Obesity.  Sonographer:    Lavenia Atlas Referring Phys: [id number] DENISE A WOLFE PROCEDURE: The transesophogeal probe was passed without difficulty through the esophogus of the patient. Local oropharyngeal anesthetic was provided with viscous lidocaine. Sedation performed by different physician. The patient was monitored while under deep sedation. Anesthestetic sedation was provided intravenously by Anesthesiology: 319mg  of Propofol, 40mg  of Lidocaine. The patient developed no complications during the procedure. IMPRESSIONS  1. Left ventricular ejection fraction, by estimation, is 60 to 65%. The left ventricle has normal function. The left ventricle has no regional wall motion abnormalities.  There is moderate concentric left ventricular hypertrophy.  2. Right ventricular systolic function is normal. The right ventricular size is normal.  3. No left atrial/left atrial appendage thrombus was detected.  4. The mitral valve is normal in structure. Trivial mitral valve regurgitation.  5. The aortic valve is tricuspid. Aortic valve regurgitation is not visualized. No aortic stenosis is present.  6. There is mild (Grade II) plaque.  7. A small patent foramen ovale is detected by color flow Doppler. 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 FINDINGS  Left Ventricle: Left ventricular ejection fraction, by estimation, is 60 to 65%. The left ventricle has normal function. The left ventricle has no regional wall motion abnormalities. The left ventricular internal cavity size was normal in size. There is  moderate concentric left ventricular hypertrophy. Right Ventricle: The right ventricular size is normal. No increase in right ventricular wall thickness. Right ventricular systolic function is normal. Left Atrium: Left atrial size was normal in size. No left atrial/left atrial appendage thrombus was detected. Right Atrium: Right atrial size was normal in size. Pericardium: There is no evidence of pericardial effusion. Mitral Valve: The mitral valve is normal in structure. Trivial mitral valve regurgitation. Tricuspid Valve: The tricuspid valve is normal in  structure. Tricuspid valve regurgitation is trivial. Aortic Valve: The aortic valve is tricuspid. Aortic valve regurgitation is not visualized. No aortic stenosis is present. Pulmonic Valve: The pulmonic valve was normal in structure. Pulmonic valve regurgitation is not visualized. Aorta: The aortic root and ascending aorta are structurally normal, with no evidence of dilitation. There is mild (Grade II) plaque. IAS/Shunts: Evidence of atrial level  shunting detected by color flow Doppler. Agitated saline contrast was given intravenously to evaluate for intracardiac shunting. Agitated saline contrast bubble study was positive with shunting observed within 3-6 cardiac cycles suggestive of interatrial shunt. A small patent foramen ovale is detected. Laurance Flatten MD Electronically signed by Laurance Flatten MD Signature Date/Time: 01/02/2021/9:46:54 AM    Final    ECHOCARDIOGRAM COMPLETE BUBBLE STUDY  Result Date: 12/29/2020    ECHOCARDIOGRAM REPORT   Patient Name:   DAYMEIN NUNNERY Date of Exam: 12/29/2020 Medical Rec #:  [id number]     Height:       69.0 in Accession #:    [id number]    Weight:       274.1 lb Date of Birth:  Jan 08, 1966     BSA:          2.362 m Patient Age:    54 years      BP:           162/102 mmHg Patient Gender: M             HR:           72 bpm. Exam Location:  Inpatient Procedure: 2D Echo and Saline Contrast Bubble Study Indications:    stroke 434.91  History:        Patient has no prior history of Echocardiogram examinations.                 Signs/Symptoms:Chest Pain; Risk Factors:Hypertension.  Sonographer:    Delcie Roch Referring Phys: [id number] [facility] KHALIQDINA IMPRESSIONS  1. Left ventricular ejection fraction, by estimation, is 65 to 70%. The left ventricle has normal function. The left ventricle has no regional wall motion abnormalities. There is moderate concentric left ventricular hypertrophy. Left ventricular diastolic parameters are consistent with Grade I diastolic dysfunction (impaired relaxation).  2. Right ventricular systolic function is normal. The right ventricular size is normal.  3. The mitral valve is normal in structure. Mild mitral valve regurgitation. No evidence of mitral stenosis.  4. The aortic valve is normal in structure. Aortic valve regurgitation is not visualized. No aortic stenosis is present.  5. The inferior vena cava is normal in size with greater than 50% respiratory variability, suggesting  right atrial pressure of 3 mmHg. Conclusion(s)/Recommendation(s): No intracardiac source of embolism detected on this transthoracic study. A transesophageal echocardiogram is recommended to exclude cardiac source of embolism if clinically indicated. FINDINGS  Left Ventricle: Left ventricular ejection fraction, by estimation, is 65 to 70%. The left ventricle has normal function. The left ventricle has no regional wall motion abnormalities. The left ventricular internal cavity size was normal in size. There is  moderate concentric left ventricular hypertrophy. Left ventricular diastolic parameters are consistent with Grade I diastolic dysfunction (impaired relaxation). Normal left ventricular filling pressure. Right Ventricle: The right ventricular size is normal. No increase in right ventricular wall thickness. Right ventricular systolic function is normal. Left Atrium: Left atrial size was normal in size. Right Atrium: Right atrial size was normal in size. Pericardium: There is no evidence of pericardial effusion. Mitral Valve: The mitral valve is normal in structure.  Mild mitral valve regurgitation. No evidence of mitral valve stenosis. Tricuspid Valve: The tricuspid valve is normal in structure. Tricuspid valve regurgitation is mild . No evidence of tricuspid stenosis. Aortic Valve: The aortic valve is normal in structure. Aortic valve regurgitation is not visualized. No aortic stenosis is present. Pulmonic Valve: The pulmonic valve was normal in structure. Pulmonic valve regurgitation is not visualized. No evidence of pulmonic stenosis. Aorta: The aortic root is normal in size and structure. Venous: The inferior vena cava is normal in size with greater than 50% respiratory variability, suggesting right atrial pressure of 3 mmHg. IAS/Shunts: No atrial level shunt detected by color flow Doppler. Agitated saline contrast was given intravenously to evaluate for intracardiac shunting. Additional Comments: Negative  bubble study.  LEFT VENTRICLE PLAX 2D LVIDd:         4.50 cm  Diastology LVIDs:         2.50 cm  LV e' medial:    6.74 cm/s LV PW:         1.00 cm  LV E/e' medial:  11.3 LV IVS:        1.20 cm  LV e' lateral:   7.29 cm/s LVOT diam:     2.40 cm  LV E/e' lateral: 10.5 LV SV:         82 LV SV Index:   35 LVOT Area:     4.52 cm  RIGHT VENTRICLE RV S prime:     13.10 cm/s TAPSE (M-mode): 2.3 cm LEFT ATRIUM             Index       RIGHT ATRIUM           Index LA diam:        3.50 cm 1.48 cm/m  RA Area:     15.20 cm LA Vol (A2C):   56.8 ml 24.05 ml/m RA Volume:   37.00 ml  15.66 ml/m LA Vol (A4C):   54.8 ml 23.20 ml/m LA Biplane Vol: 58.7 ml 24.85 ml/m  AORTIC VALVE LVOT Vmax:   85.50 cm/s LVOT Vmean:  63.000 cm/s LVOT VTI:    0.182 m  AORTA Ao Root diam: 3.20 cm Ao Asc diam:  3.40 cm MITRAL VALVE MV Area (PHT): 5.13 cm    SHUNTS MV Decel Time: 148 msec    Systemic VTI:  0.18 m MV E velocity: 76.30 cm/s  Systemic Diam: 2.40 cm MV A velocity: 64.30 cm/s MV E/A ratio:  1.19 Tobias AlexanderKatarina Nelson MD Electronically signed by Tobias AlexanderKatarina Nelson MD Signature Date/Time: 12/29/2020/12:02:11 PM    Final    CT HEAD CODE STROKE WO CONTRAST  Result Date: 12/28/2020 CLINICAL DATA:  Code stroke. EXAM: CT HEAD WITHOUT CONTRAST TECHNIQUE: Contiguous axial images were obtained from the base of the skull through the vertex without intravenous contrast. COMPARISON:  None. FINDINGS: Brain: No evidence of acute infarction, hemorrhage, hydrocephalus, extra-axial collection or mass lesion/mass effect. Vascular: No hyperdense vessel. Skull: Normal. Negative for fracture or focal lesion. Sinuses/Orbits: Mucosal thickening throughout the paranasal sinuses with fluid level within the sphenoid and maxillary sinuses. The orbits are maintained. ASPECTS [facility]([place] Stroke [facility] Early CT Score) - Ganglionic level infarction (caudate, lentiform nuclei, internal capsule, insula, M1-M3 cortex): 7 - Supraganglionic infarction (M4-M6 cortex): 3 Total score  (0-10 with 10 being normal): 10 IMPRESSION: 1. No evidence of acute intracranial abnormality. 2. ASPECTS is 10. 3. Mucosal thickening throughout the paranasal sinuses with fluid level within the sphenoid and maxillary sinuses. Correlate for acute  sinusitis. Results communicated to Dr. Derry Lory on 12/28/2020 at 10:40 a.m. via [facility] paging system. Electronically Signed   By: Baldemar Lenis M.D.   On: 12/28/2020 10:42   CT ANGIO HEAD CODE STROKE  Result Date: 12/28/2020 CLINICAL DATA:  Stroke.  Left-sided deficit. EXAM: CT ANGIOGRAPHY HEAD AND NECK TECHNIQUE: Multidetector CT imaging of the head and neck was performed using the standard protocol during bolus administration of intravenous contrast. Multiplanar CT image reconstructions and MIPs were obtained to evaluate the vascular anatomy. Carotid stenosis measurements (when applicable) are obtained utilizing NASCET criteria, using the distal internal carotid diameter as the denominator. COMPARISON:  None. Head CT December 28, 2020. FINDINGS: CTA NECK FINDINGS Aortic arch: Common origin of the innominate and left common carotid artery from the aortic arch. Imaged portion shows no evidence of aneurysm or dissection. No significant stenosis of the major arch vessel origins. Right carotid system: Minimal atherosclerotic changes in the right carotid bifurcation. No evidence of dissection, stenosis (50% or greater) or occlusion. Left carotid system: No evidence of dissection, stenosis (50% or greater) or occlusion. Vertebral arteries: The right vertebral artery is hypoplastic and ends in PICA. The dominant left vertebral artery has no evidence of dissection, stenosis or occlusion. Skeleton: Negative. Other neck: Negative Upper chest: Negative. Review of the MIP images confirms the above findings CTA HEAD FINDINGS Anterior circulation: Subtle luminal irregularity throughout the bilateral MCA and ACA vascular tree may represent mild intracranial  atherosclerotic disease. No significant stenosis, proximal occlusion, aneurysm, or vascular malformation. Posterior circulation: The right vertebral artery ends in PICA. The left vertebral artery is dominant and has normal caliber. The basilar artery has small caliber throughout its entire course. Hypoplastic right P1/PCA with prominent right posterior communicating artery. Mild luminal irregularities in the bilateral PCAs may represent intracranial atherosclerotic disease without hemodynamically significant stenosis. Venous sinuses: As permitted by contrast timing, patent. Anatomic variants: Hypoplastic right P1 segment. IMPRESSION: 1. No large vessel occlusion or significant stenosis in the neck. 2. Subtle luminal irregularity throughout the bilateral MCA, ACA and PCA vascular tree may represent mild intracranial atherosclerotic disease without hemodynamically significant stenosis. Electronically Signed   By: Baldemar Lenis M.D.   On: 12/28/2020 11:04   CT ANGIO NECK CODE STROKE  Result Date: 12/28/2020 CLINICAL DATA:  Stroke.  Left-sided deficit. EXAM: CT ANGIOGRAPHY HEAD AND NECK TECHNIQUE: Multidetector CT imaging of the head and neck was performed using the standard protocol during bolus administration of intravenous contrast. Multiplanar CT image reconstructions and MIPs were obtained to evaluate the vascular anatomy. Carotid stenosis measurements (when applicable) are obtained utilizing NASCET criteria, using the distal internal carotid diameter as the denominator. COMPARISON:  None. Head CT December 28, 2020. FINDINGS: CTA NECK FINDINGS Aortic arch: Common origin of the innominate and left common carotid artery from the aortic arch. Imaged portion shows no evidence of aneurysm or dissection. No significant stenosis of the major arch vessel origins. Right carotid system: Minimal atherosclerotic changes in the right carotid bifurcation. No evidence of dissection, stenosis (50% or greater) or  occlusion. Left carotid system: No evidence of dissection, stenosis (50% or greater) or occlusion. Vertebral arteries: The right vertebral artery is hypoplastic and ends in PICA. The dominant left vertebral artery has no evidence of dissection, stenosis or occlusion. Skeleton: Negative. Other neck: Negative Upper chest: Negative. Review of the MIP images confirms the above findings CTA HEAD FINDINGS Anterior circulation: Subtle luminal irregularity throughout the bilateral MCA and ACA vascular tree may represent mild  intracranial atherosclerotic disease. No significant stenosis, proximal occlusion, aneurysm, or vascular malformation. Posterior circulation: The right vertebral artery ends in PICA. The left vertebral artery is dominant and has normal caliber. The basilar artery has small caliber throughout its entire course. Hypoplastic right P1/PCA with prominent right posterior communicating artery. Mild luminal irregularities in the bilateral PCAs may represent intracranial atherosclerotic disease without hemodynamically significant stenosis. Venous sinuses: As permitted by contrast timing, patent. Anatomic variants: Hypoplastic right P1 segment. IMPRESSION: 1. No large vessel occlusion or significant stenosis in the neck. 2. Subtle luminal irregularity throughout the bilateral MCA, ACA and PCA vascular tree may represent mild intracranial atherosclerotic disease without hemodynamically significant stenosis. Electronically Signed   By: Baldemar Lenis M.D.   On: 12/28/2020 11:04    Labs:  Basic Metabolic Panel: Recent Labs  Lab 01/09/21 0548  CREATININE 1.06    CBC: No results for input(s): WBC, NEUTROABS, HGB, HCT, MCV, PLT in the last 168 hours.  CBG: No results for input(s): GLUCAP in the last 168 hours.  Family history positive for hypertension and hyperlipidemia.  Denies any colon cancer esophageal cancer or rectal cancer  Brief HPI:   Wyatte Dames is a 55 y.o. right-handed  male with history of untreated hypertension chronic back pain morbid obesity with BMI 40.47 and tobacco abuse.  Lives alone independent prior to admission.  He does have family in the area.  Presented 12/28/2020 with left-sided weakness and slurred speech.  Cranial CT scan negative for acute changes.  CT angiogram of head and neck with no occlusion or significant stenosis.  Patient did receive tPA.  MRI showed acute infarct within the posterior putamen posterior body of the caudate and radiating white matter tracts on the right.  No hemorrhage or mass-effect.  EEG negative for seizure.  Echocardiogram with ejection fraction 65 to 70% no wall motion abnormalities.  TEE showed ejection fraction of 60 to 65% without thrombus small PFO.  Admission chemistries unremarkable except glucose 194 hemoglobin A1c 6.5 troponin negative.  Currently maintained on aspirin Plavix x3 weeks and aspirin alone.  Subcutaneous Lovenox for DVT prophylaxis.  Due to patient's slurred speech left-sided weakness decreased functional mobility he was admitted for a comprehensive rehab program.   Hospital Course: Ediberto Sens was admitted to rehab 01/02/2021 for inpatient therapies to consist of PT, ST and OT at least three hours five days a week. Past admission physiatrist, therapy team and rehab RN have worked together to provide customized collaborative inpatient rehab.  Pertaining to patient's right basal ganglia infarction likely small vessel disease.  Patient did receive tPA.  There was finding of small PFO on TEE felt not to be because of CVA per neurology services.  Lovenox for DVT prophylaxis.  Maintain on aspirin and Plavix x3 weeks for CVA prophylaxis and aspirin alone.  Chronic back pain Lidoderm patch as directed Robaxin scheduled could be changed to as needed as needed.  Patient did have some sleep deprivation did well with trazodone.  Blood pressure controlled on Norvasc titrate as needed would need outpatient follow-up.  Lipitor  for hyperlipidemia.  NicoDerm patch for tobacco abuse as well as counseling.  BMI 40.47 dietary follow-up.  Patient did have a hemoglobin A1c 6.5 placed on a diabetic diet with diabetic teaching although latest blood sugars did improve to [phone] and could receive follow-up with outpatient.   Blood pressures were monitored on TID basis and controlled     Rehab course: During patient's stay in rehab weekly team conferences  were held to monitor patient's progress, set goals and discuss barriers to discharge. At admission, patient required min mod assist 10 feet rolling walker minimal assist sit to stand moderate assist squat pivot transfers.  Minimal assist upper body bathing mod assist lower body bathing mod assist upper body dressing max assist lower body dressing  Physical exam.  Blood pressure 118/70 pulse 80 temperature 99 respirations 18 oxygen saturations 92% room air Constitutional.  No acute distress HEENT Head.  Normocephalic and atraumatic Eyes.  Pupils round and reactive to light no discharge.nystagmus Neck.  Supple nontender no JVD without thyromegaly Cardiac regular rate rhythm no extra sounds or murmur heard Abdomen.  Soft nontender positive bowel sounds without rebound Respiratory effort normal no respiratory distress without wheeze Extremities.  No clubbing cyanosis or edema Skin.  Intact warm and dry Neurologic.  Alert no acute distress mild dysarthria but intelligible.  Left-sided facial droop fair awareness of deficits.  Left upper extremity 3/5 proximal and 4/5 distal.  Left lower extremity 4/5 throughout.  Sensation intact  He/She  has had improvement in activity tolerance, balance, postural control as well as ability to compensate for deficits. He/She has had improvement in functional use RUE/LUE  and RLE/LLE as well as improvement in awareness.  Patient ambulates 250 feet using assistive device supervision.  Provided some verbal cues for equalizing stance.  Dynamic  reaching activities requiring 1 step out to reach targets place from overhead to below knee height with supervision.  Gather his belongings for activities day living and homemaking.  Full family teaching completed plan discharge to home       Disposition: Discharge to home   Diet: Diabetic diet  Special Instructions: No driving smoking or alcohol  Medications at discharge 1.  Tylenol as needed 2.  Norvasc 5 mg daily 3.  Aspirin 81 mg daily 4.  Lipitor 80 mg daily 5.  Plavix 75 mg daily x13 days and stop 6.  Lidoderm patch as directed 7.  Robaxin 500 mg twice daily can change to as needed 8.  NicoDerm patch taper as directed  30-35 minutes were spent completing discharge summary and discharge planning  Discharge Instructions    Ambulatory referral to Neurology   Complete by: As directed    An appointment is requested in approximately right basal ganglia infarction   Ambulatory referral to Physical Medicine Rehab   Complete by: As directed    Moderate complexity follow-up 1 to 2 weeks right basal ganglia infarction       Follow-up Information    Kirsteins, Victorino Sparrow, MD Follow up.   Specialty: Physical Medicine and Rehabilitation Why: Office to call for appointment Contact information: [address] [place] [address] [place] [address] [phone]               Signed: Charlton Amor 01/10/2021, 5:49 AM

## 2021-01-08 NOTE — Progress Notes (Signed)
Physical Therapy Note  Patient Details  Name: Aaron Gibson MRN: [id number] Date of Birth: 10/22/1966 Today's Date: 01/08/2021    Attempted to see pt for scheduled therapy session. Pt refused participation, no reason given. Missed 60 minutes.  [place], SPT 01/08/2021, 4:18 PM

## 2021-01-09 ENCOUNTER — Inpatient Hospital Stay (HOSPITAL_COMMUNITY): Payer: Self-pay | Admitting: Occupational Therapy

## 2021-01-09 ENCOUNTER — Other Ambulatory Visit (HOSPITAL_COMMUNITY): Payer: Self-pay | Admitting: Physician Assistant

## 2021-01-09 ENCOUNTER — Inpatient Hospital Stay (HOSPITAL_COMMUNITY): Payer: Self-pay | Admitting: Physical Therapy

## 2021-01-09 LAB — CREATININE, SERUM
Creatinine, Ser: 1.06 mg/dL (ref 0.61–1.24)
GFR, Estimated: >60 mL/min (ref >60)

## 2021-01-09 MED ORDER — CLOPIDOGREL BISULFATE 75 MG PO TABS: 75.0000 mg | ORAL_TABLET | Freq: Every day | ORAL | 0 refills | Status: DC

## 2021-01-09 MED ORDER — METHOCARBAMOL 500 MG PO TABS: 500.0000 mg | ORAL_TABLET | Freq: Three times a day (TID) | ORAL | 0 refills | Status: DC

## 2021-01-09 MED ORDER — PANTOPRAZOLE SODIUM 40 MG PO TBEC: 40.0000 mg | DELAYED_RELEASE_TABLET | Freq: Every day | ORAL | 0 refills | Status: DC

## 2021-01-09 MED ORDER — AMLODIPINE BESYLATE 10 MG PO TABS: 10.0000 mg | ORAL_TABLET | Freq: Every day | ORAL | 0 refills | Status: DC

## 2021-01-09 MED ORDER — NICOTINE 14 MG/24HR TD PT24: MEDICATED_PATCH | TRANSDERMAL | 0 refills | Status: DC

## 2021-01-09 MED ORDER — LIDOCAINE 5 % EX PTCH: 1.0000 | MEDICATED_PATCH | CUTANEOUS | 0 refills | Status: DC

## 2021-01-09 MED ORDER — TRAZODONE HCL 100 MG PO TABS: 100.0000 mg | ORAL_TABLET | Freq: Every evening | ORAL | 0 refills | Status: DC | PRN

## 2021-01-09 MED ORDER — ATORVASTATIN CALCIUM 80 MG PO TABS: 80.0000 mg | ORAL_TABLET | Freq: Every day | ORAL | 0 refills | Status: DC

## 2021-01-09 MED FILL — traZODone HCL 100 MG TABS: 100 | 30 days supply | Qty: 30 | Fill #0

## 2021-01-09 MED FILL — LIDOCAINE PATCH 5%: 5 | 30 days supply | Qty: 30 | Fill #0

## 2021-01-09 MED FILL — METHOCARBAMOL 500 MG TABS: 500 | 20 days supply | Qty: 60 | Fill #0

## 2021-01-09 MED FILL — ATORVASTATIN CALCIUM 80 MG: 80 | 30 days supply | Qty: 30 | Fill #0

## 2021-01-09 MED FILL — AMLODIPINE BESYLATE 10 MG T: 10 | 30 days supply | Qty: 30 | Fill #0

## 2021-01-09 MED FILL — CLOPIDOGREL 75 MG TABLET: 75 | 13 days supply | Qty: 13 | Fill #0

## 2021-01-09 MED FILL — PANTOPRAZOLE SOD DR 40 MG T: 40 | 30 days supply | Qty: 30 | Fill #0

## 2021-01-09 NOTE — Progress Notes (Signed)
Occupational Therapy Session Note  Patient Details  Name: Aaron Gibson MRN: [id number] Date of Birth: 1966-08-23  Today's Date: 01/09/2021 OT Individual Time: 0623-7628 and 3151-7616 OT Individual Time Calculation (min): 59 min and 73 min  Short Term Goals: Week 1:  OT Short Term Goal 1 (Week 1): STGs = LTGs    Skilled Therapeutic Interventions/Progress Updates:    Pt greeted EOB with no c/o pain. Finishing up breakfast and using his Lt hand to open his milk container. He reported feeling ready for d/c tomorrow, already has enough HEPs for his Lt hand. We discussed his IADL goals for d/c and pt completed bed making, simulated laundry tasks, and simulated meal prep in the therapy kitchen. Pt able to ambulate at independent level without LOBs, stooping to floor/low cabinets, carrying weighted bags, and transporting simulated food items in the kitchen (pt wearing gloves). Pt practiced opening a door with a key using the Lt hand, discussed using his affected hand for faucets, light switches, and doorknobs at home for NMR throughout his daily routine. At end of session pt was left EOB with all needs within reach.   Pt reported completing his ADL routine at Mod I/Indepedent level before OT arrival today.  2nd Session 1:1 tx (73 min) Pt greeted in the w/c, asleep, easily woken with no c/o pain. He wanted to work on his Lt hand during session. Pt ambulated without AD to the dayroom where he then engaged in [facility] activities while seated. When using manipulatives, pt reported the most difficult task involved small coins. Pt requiring increased time to flip and stack pennies, stating that perceived exertion was 9/10 (on a scale of 0-10). Education provided regarding CVA support group in terms of psychosocial health and healing. Pt appeared receptive to education. Worked on [employer] by ambulating to the [employer] and then back to room, focusing on divided attention and natural arm swing. Pt returned to room  and transferred to the w/c, left him with all needs within reach.     Therapy Documentation Precautions:  Precautions Precautions: Fall Precaution Comments: L sided weakness Restrictions Weight Bearing Restrictions: No Pain: Pain Assessment Pain Scale: 0-10 Pain Score: 2  Pain Type: Acute pain Pain Location: Back Pain Orientation: Mid;Lower Pain Descriptors / Indicators: Aching;Sore Pain Onset: With Activity Patients Stated Pain Goal: 2 Multiple Pain Sites: No ADL: ADL Eating: Independent Grooming: Modified independent Where Assessed-Grooming: Standing at sink Upper Body Bathing: Modified independent Where Assessed-Upper Body Bathing: Shower Lower Body Bathing: Modified independent Where Assessed-Lower Body Bathing: Shower Upper Body Dressing: Modified independent (Device) Where Assessed-Upper Body Dressing: Chair Lower Body Dressing: Modified independent Where Assessed-Lower Body Dressing: Chair Toileting: Modified independent Where Assessed-Toileting: Teacher, adult education: Engineer, agricultural Method: Production designer, theatre/television/film Method: Designer, industrial/product: Shower seat with back      Therapy/Group: Individual Therapy  Towana Stenglein A Ron Junco 01/09/2021, 12:02 PM

## 2021-01-09 NOTE — Progress Notes (Signed)
Physical Therapy Discharge Summary  Patient Details  Name: Aaron Gibson MRN: [id number] Date of Birth: Nov 11, 1966  Today's Date: 01/09/2021 PT Individual Time: 1004-1105 PT Individual Time Calculation (min): 61 min    Patient has met 9 of 9 long term goals due to improved activity tolerance, improved balance, increased strength, functional use of  left upper extremity and left lower extremity and improved coordination.  Patient to discharge at an ambulatory level Modified Independent/ Supervision.   Patient's care partner is independent to provide the necessary physical assistance at discharge.  Recommendation:  Patient will benefit from ongoing skilled PT services in outpatient setting to continue to advance safe functional mobility, address ongoing impairments in BLE strength, standing dynamic balance,fine motor for BLE, and minimize fall risk.  Equipment: SPC  Reasons for discharge: treatment goals met and discharge from hospital  Patient/family agrees with progress made and goals achieved: Yes  PT Discharge Precautions/Restrictions Precautions Precautions: Fall Precaution Comments: L sided weakness Restrictions Weight Bearing Restrictions: No Vital Signs  Pain Pain Assessment Pain Scale: 0-10 Pain Score: 2  Pain Type: Acute pain Pain Location: Back Pain Orientation: Mid;Lower Pain Descriptors / Indicators: Aching;Sore Pain Onset: With Activity Patients Stated Pain Goal: 2 Multiple Pain Sites: No Vision/Perception  Vision - Assessment Eye Alignment: Within Functional Limits Perception Perception: Within Functional Limits Praxis Praxis: Intact  Cognition Overall Cognitive Status: Within Functional Limits for tasks assessed Arousal/Alertness: Awake/alert Orientation Level: Oriented X4 Sensation Sensation Light Touch: Appears Intact Coordination Gross Motor Movements are Fluid and Coordinated: Yes Fine Motor Movements are Fluid and Coordinated: Yes Motor   Motor Motor: Hemiplegia Motor - Discharge Observations: mild L hemipareisis, UE>LE  Mobility Bed Mobility Bed Mobility: Supine to Sit;Sit to Supine Left Sidelying to Sit: Independent with assistive device Supine to Sit: Independent with assistive device (Uses bedrail) Sitting - Scoot to Edge of Bed: Independent Sit to Supine: Independent Scooting to [facility]: Independent with assistive device Transfers Transfers: Stand to Sit;Sit to Stand;Stand Pivot Transfers Sit to Stand: Independent Stand to Sit: Independent Stand Pivot Transfers: Independent Transfer (Assistive device): None Locomotion  Gait Ambulation: Yes Gait Assistance: Independent Gait Distance (Feet): 300 Feet Assistive device: None Gait Assistance Details: Ambulating independently for household distances, Mod I with SPC for community distances Gait Gait: Yes Gait Pattern: Impaired Gait Pattern: Step-through pattern;Decreased hip/knee flexion - left Gait velocity: decreased from normal gait speed High Level Ambulation High Level Ambulation: Backwards walking;Side stepping Side Stepping: Improved performance with gentle foot strike and floor clearance on R  and L travelling in either direction Backwards Walking: VC required for increased knee flexion and gentle toe strike; much improved since [facility] Stairs / Additional Locomotion Stairs: Yes Stairs Assistance: Supervision/Verbal cueing Stair Management Technique: One rail Right;Alternating pattern Number of Stairs: 20 Height of Stairs: 6 Ramp: Independent Curb: Supervision/Verbal cueing Wheelchair Mobility Wheelchair Mobility: No  Trunk/Postural Assessment  Cervical Assessment Cervical Assessment: Within Functional Limits Thoracic Assessment Thoracic Assessment: Within Functional Limits Lumbar Assessment Lumbar Assessment: Exceptions to [facility] (anterior pelvic tilt) Postural Control Postural Control: Within Functional Limits  Balance Balance Balance Assessed:  Yes Standardized Balance Assessment Standardized Balance Assessment: Berg Balance Test;Dynamic Gait Index Berg Balance Test Sit to Stand: Able to stand without using hands and stabilize independently Standing Unsupported: Able to stand safely 2 minutes Sitting with Back Unsupported but Feet Supported on Floor or Stool: Able to sit safely and securely 2 minutes Stand to Sit: Sits safely with minimal use of hands Transfers: Able to transfer safely, minor use  of hands Standing Unsupported with Eyes Closed: Able to stand 10 seconds with supervision Standing Ubsupported with Feet Together: Able to place feet together independently and stand 1 minute safely From Standing, Reach Forward with Outstretched Arm: Can reach confidently >25 cm (10") From Standing Position, Pick up Object from Floor: Able to pick up shoe safely and easily From Standing Position, Turn to Look Behind Over each Shoulder: Looks behind from both sides and weight shifts well Turn 360 Degrees: Able to turn 360 degrees safely in 4 seconds or less Standing Unsupported, Alternately Place Feet on Step/Stool: Able to stand independently and safely and complete 8 steps in 20 seconds Standing Unsupported, One Foot in Front: Able to plae foot ahead of the other independently and hold 30 seconds Standing on One Leg: Able to lift leg independently and hold > 10 seconds Total Score: 54 Dynamic Gait Index Level Surface: Normal Change in Gait Speed: Normal Gait with Horizontal Head Turns: Normal Gait with Vertical Head Turns: Normal Gait and Pivot Turn: Moderate Impairment Step Over Obstacle: Normal Step Around Obstacles: Normal Steps: Normal Total Score: 22 Dynamic Sitting Balance Dynamic Sitting - Balance Support: Feet supported Dynamic Sitting - Level of Assistance: 7: Independent Reach (Patient is able to reach ___ inches to right, left, forward, back): Pt able to reach up to 13 inches forward, R, L Dynamic Sitting - Balance  Activities: Lateral lean/weight shifting;Forward lean/weight shifting;Reaching for objects;Reaching across midline Static Standing Balance Static Standing - Balance Support: No upper extremity supported;During functional activity Static Standing - Level of Assistance: 7: Independent Dynamic Standing Balance Dynamic Standing - Balance Support: No upper extremity supported;During functional activity Dynamic Standing - Level of Assistance: 5: Stand by assistance Dynamic Standing - Balance Activities: Lateral lean/weight shifting;Forward lean/weight shifting;[place];Reaching for objects;Reaching for weighted objects;Reaching across midline;Kicking ball;Compliant surfaces Extremity Assessment      RLE Assessment RLE Assessment: Within Functional Limits Active Range of Motion (AROM) Comments: WFL General Strength Comments: grossly 4+/ 5 LLE Assessment LLE Assessment: Exceptions to [facility] Active Range of Motion (AROM) Comments: WFL LLE Strength Left Hip Flexion: 4/5 Left Hip Extension: 4/5 Left Hip ABduction: 4-/5 Left Hip ADduction: 4-/5 Left Knee Flexion: 3+/5 Left Knee Extension: 4/5 Left Ankle Dorsiflexion: 4-/5  Session notes: Pt is a very determined and hard-working individual. Education has been provided to pt re: local probono therapy clinics. He has also signed up for water aerobics classes at the local [employer] that his sister will drive him to.   NMR facilitated this session with focus on dynamic gait and balance. Guided pt in lateral stepping in ready position, backwards stepping, forward high knee with pause stepping. All with vc for improved and exaggerated technique with gentle foot placement. Pt states that his low back does hurt a bit following yesterday's PT session and already has a pain patch applied to low back. Demonstrated forward stretch while seated or kneeling over theraball or ottoman to provide some relief to low back.   Pt seated in w/c with feet propped on bed  surface at end of session. Provided with ice and all needs left within reach including personal cellphone.   Alger Simons 01/09/2021, 11:47 AM

## 2021-01-09 NOTE — Progress Notes (Signed)
[facility] PHYSICAL MEDICINE & REHABILITATION PROGRESS NOTE   Subjective/Complaints:  Pt feels ok toeday no new issues overnite, looking forward to discharge in am , discussed BP meds and need for higher dose   Review of systems negative chest pain shortness of breath nausea vomiting diarrhea constipation, +fatigue, insomnia.  Objective:   No results found. No results for input(s): WBC, HGB, HCT, PLT in the last 72 hours. Recent Labs    01/09/21 0548  CREATININE 1.06    Intake/Output Summary (Last 24 hours) at 01/09/2021 1028 Last data filed at 01/09/2021 0700 Gross per 24 hour  Intake 920 ml  Output 800 ml  Net 120 ml        Physical Exam: Vital Signs Blood pressure (!) 156/87, pulse 80, temperature 98 F (36.7 C), resp. rate 18, height 5\' 9"  (1.753 m), SpO2 95 %.   General: No acute distress Mood and affect are appropriate Heart: Regular rate and rhythm no rubs murmurs or extra sounds Lungs: Clear to auscultation, breathing unlabored, no rales or wheezes Abdomen: Positive bowel sounds, soft nontender to palpation, nondistended Extremities: No clubbing, cyanosis, or edema Skin: No evidence of breakdown, no evidence of rash  Neurologic: Cranial nerves II through XII intact, motor strength is 5/5 in bilateral deltoid, bicep, tricep, grip, hip flexor, knee extensors, ankle dorsiflexor and plantar flexor Sensory exam normal sensation to light touchin bilateral upperextremities  Musculoskeletal: Full range of motion in all 4 extremities. No joint swelling    Assessment/Plan: 1. Functional deficits which require 3+ hours per day of interdisciplinary therapy in a comprehensive inpatient rehab setting.  Physiatrist is providing close team supervision and 24 hour management of active medical problems listed below.  Physiatrist and rehab team continue to assess barriers to discharge/monitor patient progress toward functional and medical goals  Care Tool:  Bathing     Body parts bathed by patient: Right arm,Left arm,Chest,Abdomen,Front perineal area,Buttocks,Right upper leg,Left upper leg,Right lower leg,Left lower leg,Face         Bathing assist Assist Level: Independent with assistive device     Upper Body Dressing/Undressing Upper body dressing   What is the patient wearing?: Pull over shirt    Upper body assist Assist Level: Independent with assistive device    Lower Body Dressing/Undressing Lower body dressing      What is the patient wearing?: Underwear/pull up,Pants     Lower body assist Assist for lower body dressing: Independent with assitive device     Toileting Toileting    Toileting assist Assist for toileting: Independent with assistive device Assistive Device Comment: urinal   Transfers Chair/bed transfer  Transfers assist     Chair/bed transfer assist level: Supervision/Verbal cueing     Locomotion Ambulation   Ambulation assist      Assist level: Supervision/Verbal cueing Assistive device: No Device Max distance: 150 ft   Walk 10 feet activity   Assist     Assist level: Supervision/Verbal cueing Assistive device: No Device   Walk 50 feet activity   Assist    Assist level: Supervision/Verbal cueing Assistive device: No Device    Walk 150 feet activity   Assist    Assist level: Supervision/Verbal cueing Assistive device: No Device    Walk 10 feet on uneven surface  activity   Assist     Assist level: Contact Guard/Touching assist Assistive device: Other (comment) (intermittent HR use)   Wheelchair     Assist Will patient use wheelchair at discharge?: No  Wheelchair 50 feet with 2 turns activity    Assist            Wheelchair 150 feet activity     Assist          Blood pressure (!) 156/87, pulse 80, temperature 98 F (36.7 C), resp. rate 18, height 5\' 9"  (1.753 m), SpO2 95 %.   Medical Problem List and Plan: 1.  Left-sided  weakness with dysarthria secondary to right basal ganglia infarct likely small vessel disease.  Patient did receive TPA.  TEE showed small PFO without thrombus             -patient may shower             -ELOS/Goals: modI 1/18  -Continue CIR PT,OT  2.  Antithrombotics: -DVT/anticoagulation: Lovenox 40 mg daily             -antiplatelet therapy: Aspirin 81 mg daily and Plavix 75 mg daily x3 weeks then aspirin alone- 01/18/2021 ASA alone 3. Pain Management: Lidoderm patch as directed, Robaxin 500 mg every 8 hours. Pain is well controlled, if continues to be well controlled with therapy, consider making Robaxin PRN.   1/15: denies pain: decrease robaxin to BID PRN.   1/16: denies pain, decrease Robaxin to HS. 4. Mood: Trazodone 50 mg nightly. Ineffective, increased to 100mg .             -antipsychotic agents: N/A 5. Neuropsych: This patient is capable of making decisions on his own behalf. 6. Skin/Wound Care: Routine skin checks 7. Fluids/Electrolytes/Nutrition: Routine in and outs with follow-up chemistries 8.  Hypertension.  Labile, continue Norvasc 5 mg daily.  Monitor with increased mobility   increase Norvasc to 10mg  on 1/17. Vitals:   01/08/21 1939 01/09/21 0518  BP: (!) 153/92 (!) 156/87  Pulse: 76 80  Resp: 16 18  Temp: 98.8 F (37.1 C) 98 F (36.7 C)  SpO2: 100% 95%   9.  Hyperlipidemia.  Lipitor 10.  Tobacco abuse.  NicoDerm patch.  Counseling 11.  Morbid obesity.  BMI 40.47.  Dietary follow-up 12. Bradycardia: resolved, monitor HR TID.  #13.  Small PFO not cause of CVA per Dr 2/17, no need for closure, cancel cardiology consult  Pt  Current CVA c/w SVD, pt has risk factors of HTN and HLD  LOS: 7 days A FACE TO FACE EVALUATION WAS PERFORMED  01/10/21 01/09/2021, 10:28 AM

## 2021-01-09 NOTE — Plan of Care (Signed)
  Problem: RH Balance Goal: LTG Patient will maintain dynamic standing with ADLs (OT) Description: LTG:  Patient will maintain dynamic standing balance with assist during activities of daily living (OT)  Outcome: Completed/Met   Problem: RH Grooming Goal: LTG Patient will perform grooming w/assist,cues/equip (OT) Description: LTG: Patient will perform grooming with assist, with/without cues using equipment (OT) Outcome: Completed/Met   Problem: RH Bathing Goal: LTG Patient will bathe all body parts with assist levels (OT) Description: LTG: Patient will bathe all body parts with assist levels (OT) Outcome: Completed/Met   Problem: RH Dressing Goal: LTG Patient will perform upper body dressing (OT) Description: LTG Patient will perform upper body dressing with assist, with/without cues (OT). Outcome: Completed/Met Goal: LTG Patient will perform lower body dressing w/assist (OT) Description: LTG: Patient will perform lower body dressing with assist, with/without cues in positioning using equipment (OT) Outcome: Completed/Met   Problem: RH Toileting Goal: LTG Patient will perform toileting task (3/3 steps) with assistance level (OT) Description: LTG: Patient will perform toileting task (3/3 steps) with assistance level (OT)  Outcome: Completed/Met   Problem: RH Functional Use of Upper Extremity Goal: LTG Patient will use RT/LT upper extremity as a (OT) Description: LTG: Patient will use right/left upper extremity as a stabilizer/gross assist/diminished/nondominant/dominant level with assist, with/without cues during functional activity (OT) Outcome: Completed/Met   Problem: RH Simple Meal Prep Goal: LTG Patient will perform simple meal prep w/assist (OT) Description: LTG: Patient will perform simple meal prep with assistance, with/without cues (OT). Outcome: Completed/Met   Problem: RH Laundry Goal: LTG Patient will perform laundry w/assist, cues (OT) Description: LTG: Patient  will perform laundry with assistance, with/without cues (OT). Outcome: Completed/Met   Problem: RH Light Housekeeping Goal: LTG Patient will perform light housekeeping w/assist (OT) Description: LTG: Patient will perform light housekeeping with assistance, with/without cues (OT). Outcome: Completed/Met   Problem: RH Toilet Transfers Goal: LTG Patient will perform toilet transfers w/assist (OT) Description: LTG: Patient will perform toilet transfers with assist, with/without cues using equipment (OT) Outcome: Completed/Met   Problem: RH Tub/Shower Transfers Goal: LTG Patient will perform tub/shower transfers w/assist (OT) Description: LTG: Patient will perform tub/shower transfers with assist, with/without cues using equipment (OT) Outcome: Completed/Met

## 2021-01-10 NOTE — Progress Notes (Signed)
Inpatient Rehabilitation Care Coordinator Discharge Note  The overall goal for the admission was met for:   Discharge location: Yes  Length of Stay: Yes  Discharge activity level: Yes  Home/community participation: Yes  Services provided included: MD, RD, PT, OT, SLP, RN, CM, TR, Pharmacy and SW  Financial Services: Other: uninsured  Choices offered to/list presented LH:[facility]  Follow-up services arranged: Other: none, pt uninsured  Comments (or additional information):  Patient/Family verbalized understanding of follow-up arrangements: Yes  Individual responsible for coordination of the follow-up plan: self  Confirmed correct DME delivered: Aaron Gibson 01/10/2021    Aaron Gibson

## 2021-01-10 NOTE — Progress Notes (Signed)
[facility] PHYSICAL MEDICINE & REHABILITATION PROGRESS NOTE   Subjective/Complaints:  Discussed rationale for lipid lowering agent vs blood thinner  No new issues overnight, sister will pick him up  ROS- neg CP, SOB, N/V/D  Objective:   No results found. No results for input(s): WBC, HGB, HCT, PLT in the last 72 hours. Recent Labs    01/09/21 0548  CREATININE 1.06    Intake/Output Summary (Last 24 hours) at 01/10/2021 1015 Last data filed at 01/10/2021 0700 Gross per 24 hour  Intake 600 ml  Output 800 ml  Net -200 ml        Physical Exam: Vital Signs Blood pressure (!) 152/81, pulse 89, temperature 98.5 F (36.9 C), temperature source Oral, resp. rate 18, height 5\' 9"  (1.753 m), SpO2 99 %.   General: No acute distress Mood and affect are appropriate Heart: Regular rate and rhythm no rubs murmurs or extra sounds Lungs: Clear to auscultation, breathing unlabored, no rales or wheezes Abdomen: Positive bowel sounds, soft nontender to palpation, nondistended Extremities: No clubbing, cyanosis, or edema Skin: No evidence of breakdown, no evidence of rash Neurologic: Cranial nerves II through XII intact, motor strength is 5/5 in bilateral deltoid, bicep, tricep, grip, hip flexor, knee extensors, ankle dorsiflexor and plantar flexor Decreased fine motor Left finger to thumb opposition Musculoskeletal: Full range of motion in all 4 extremities. No joint swelling     Assessment/Plan:  1. Functional deficits due to CVA Stable for D/C today F/u PCP in 3-4 weeks F/u PM&R 2 weeks See D/C summary  See D/C instructions    Care Tool:  Bathing    Body parts bathed by patient: Right arm,Left arm,Chest,Abdomen,Front perineal area,Buttocks,Right upper leg,Left upper leg,Right lower leg,Left lower leg,Face         Bathing assist Assist Level: Independent with assistive device (per pt report)     Upper Body Dressing/Undressing Upper body dressing   What is the  patient wearing?: Pull over shirt    Upper body assist Assist Level: Independent (per pt report)    Lower Body Dressing/Undressing Lower body dressing      What is the patient wearing?: Underwear/pull up,Pants     Lower body assist Assist for lower body dressing: Independent (per pt report)     Toileting Toileting    Toileting assist Assist for toileting: Independent (per pt report) Assistive Device Comment: urinal   Transfers Chair/bed transfer  Transfers assist     Chair/bed transfer assist level: Independent     Locomotion Ambulation   Ambulation assist      Assist level: Independent Assistive device: No Device Max distance: >300 ft   Walk 10 feet activity   Assist     Assist level: Independent Assistive device: No Device   Walk 50 feet activity   Assist    Assist level: Independent Assistive device: No Device    Walk 150 feet activity   Assist    Assist level: Independent Assistive device: No Device    Walk 10 feet on uneven surface  activity   Assist     Assist level: Independent Assistive device: Other (comment) (No AD or handrails used)   Wheelchair     Assist Will patient use wheelchair at discharge?: No             Wheelchair 50 feet with 2 turns activity    Assist            Wheelchair 150 feet activity     Assist  Blood pressure (!) 152/81, pulse 89, temperature 98.5 F (36.9 C), temperature source Oral, resp. rate 18, height 5\' 9"  (1.753 m), SpO2 99 %.   Medical Problem List and Plan: 1.  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  Patient did receive TPA.  TEE showed small PFO without thrombus          discharge today  2.  Antithrombotics: -DVT/anticoagulation: Lovenox 40 mg daily             -antiplatelet therapy: Aspirin 81 mg daily and Plavix 75 mg daily x3 weeks then aspirin alone- 01/18/2021 ASA alone 3.  Pain Management: Lidoderm patch as directed, Robaxin 500 mg every 8 hours. Pain is well controlled, if continues to be well controlled with therapy, consider making Robaxin PRN.   1/15: denies pain: decrease robaxin to BID PRN.   1/16: denies pain, decrease Robaxin to HS. 4. Mood: Trazodone 50 mg nightly. Ineffective, increased to 100mg .             -antipsychotic agents: N/A 5. Neuropsych: This patient is capable of making decisions on his own behalf. 6. Skin/Wound Care: Routine skin checks 7. Fluids/Electrolytes/Nutrition: Routine in and outs with follow-up chemistries 8.  Hypertension.  Labile, continue Norvasc 5 mg daily.  Monitor with increased mobility   increase Norvasc to 10mg  on 1/17. Vitals:   01/09/21 1919 01/10/21 0518  BP: (!) 149/96 (!) 152/81  Pulse: 85 89  Resp: 18 18  Temp: 98 F (36.7 C) 98.5 F (36.9 C)  SpO2: 99% 99%   9.  Hyperlipidemia.  Lipitor 10.  Tobacco abuse.  NicoDerm patch.  Counseling 11.  Morbid obesity.  BMI 40.47.  Dietary follow-up 12. Bradycardia: resolved, monitor HR TID.  #13.  Small PFO not cause of CVA per Dr 2/17, no need for closure, cancel cardiology consult  Pt  Current CVA c/w SVD, pt has risk factors of HTN and HLD  LOS: 8 days A FACE TO FACE EVALUATION WAS PERFORMED  01/11/21 01/10/2021, 10:15 AM

## 2021-01-10 NOTE — Progress Notes (Signed)
Patient ID: Aaron Gibson, male   DOB: [birth year], 55 y.o.   MRN: [id number] Discharge to home accompanied by sister. Discharge instructions given to patient and sister per PAC. Taken by pharmacy to pick up medications and taken out via w/c. Pamelia Hoit

## 2021-01-10 NOTE — Discharge Instructions (Signed)
Inpatient Rehab Discharge Instructions  Aaron Gibson Discharge date and time: No discharge date for patient encounter.   Activities/Precautions/ Functional Status: Activity: activity as tolerated Diet: Regular Wound Care: Routine skin checks Functional status:  ___ No restrictions     ___ Walk up steps independently ___ 24/7 supervision/assistance   ___ Walk up steps with assistance ___ Intermittent supervision/assistance  ___ Bathe/dress independently ___ Walk with walker     _x__ Bathe/dress with assistance ___ Walk Independently    ___ Shower independently ___ Walk with assistance    ___ Shower with assistance ___ No alcohol     ___ Return to work/school ________  Medical Equipment/Items Ordered: Shower Chair                                                 Agency/Supplier: [employer]  Special Instructions: No driving smoking or alcohol  Continue aspirin 81 mg daily and Plavix 75 mg daily x13 more days then aspirin alone   STROKE/TIA DISCHARGE INSTRUCTIONS SMOKING Cigarette smoking nearly doubles your risk of having a stroke & is the single most alterable risk factor  If you smoke or have smoked in the last 12 months, you are advised to quit smoking for your health.  Most of the excess cardiovascular risk related to smoking disappears within a year of stopping.  Ask you doctor about anti-smoking medications  [place] Quit Line: 1-800-QUIT NOW  Free Smoking Cessation Classes [phone]  CHOLESTEROL Know your levels; limit fat & cholesterol in your diet  Lipid Panel     Component Value Date/Time   CHOL 192 12/29/2020 0415   TRIG 78 12/29/2020 0415   HDL 38 (L) 12/29/2020 0415   CHOLHDL 5.1 12/29/2020 0415   VLDL 16 12/29/2020 0415   LDLCALC 138 (H) 12/29/2020 0415      Many patients benefit from treatment even if their cholesterol is at goal.  Goal: Total Cholesterol (CHOL) less than 160  Goal:  Triglycerides (TRIG) less than 150  Goal:  HDL greater  than 40  Goal:  LDL (LDLCALC) less than 100   BLOOD PRESSURE American Stroke Association blood pressure target is less that 120/80 mm/Hg  Your discharge blood pressure is:  BP: (!) 150/95  Monitor your blood pressure  Limit your salt and alcohol intake  Many individuals will require more than one medication for high blood pressure  DIABETES (A1c is a blood sugar average for last 3 months) Goal HGBA1c is under 7% (HBGA1c is blood sugar average for last 3 months)  Diabetes: No known diagnosis of diabetes    Lab Results  Component Value Date   HGBA1C 6.5 (H) 12/29/2020     Your HGBA1c can be lowered with medications, healthy diet, and exercise.  Check your blood sugar as directed by your physician  Call your physician if you experience unexplained or low blood sugars.  PHYSICAL ACTIVITY/REHABILITATION Goal is 30 minutes at least 4 days per week  Activity: Increase activity slowly, Therapies: Physical Therapy: Home Health Return to work:   Activity decreases your risk of heart attack and stroke and makes your heart stronger.  It helps control your weight and blood pressure; helps you relax and can improve your mood.  Participate in a regular exercise program.  Talk with your doctor about the best form of exercise for you (dancing, walking, swimming,  cycling).  DIET/WEIGHT Goal is to maintain a healthy weight  Your discharge diet is:  Diet Order            Diet regular Room service appropriate? Yes; Fluid consistency: Thin  Diet effective now                 liquids Your height is:    Your current weight is:   Your Body Mass Index (BMI) is:     Following the type of diet specifically designed for you will help prevent another stroke.  Your goal weight range is:    Your goal Body Mass Index (BMI) is 19-24.  Healthy food habits can help reduce 3 risk factors for stroke:  High cholesterol, hypertension, and excess weight.  RESOURCES Stroke/Support Group:  Call  [phone]   STROKE EDUCATION PROVIDED/REVIEWED AND GIVEN TO PATIENT Stroke warning signs and symptoms How to activate emergency medical system (call 911). Medications prescribed at discharge. Need for follow-up after discharge. Personal risk factors for stroke. Pneumonia vaccine given:  Flu vaccine given:  My questions have been answered, the writing is legible, and I understand these instructions.  I will adhere to these goals & educational materials that have been provided to me after my discharge from the hospital.      My questions have been answered and I understand these instructions. I will adhere to these goals and the provided educational materials after my discharge from the hospital.  Patient/Caregiver Signature _______________________________ Date __________  Clinician Signature _______________________________________ Date __________  Please bring this form and your medication list with you to all your follow-up doctor's appointments.

## 2021-01-12 ENCOUNTER — Telehealth: Payer: Self-pay | Admitting: Registered Nurse

## 2021-01-12 NOTE — Telephone Encounter (Signed)
Transitional Care call  Patient name: Aaron Gibson DOB: 10-01-1966 1. Are you/is patient experiencing any problems since coming home? No a. Are there any questions regarding any aspect of care? No 2. Are there any questions regarding medications administration/dosing? No a. Are meds being taken as prescribed? Yes b. "Patient should review meds with caller to confirm" Medication List Reviewed. 3. Have there been any falls? No 4. Has Home Health been to the house and/or have they contacted you? He's uninsured: Following HE[facility] as instructed.  a. If not, have you tried to contact them? NA b. Can we help you contact them? NA 5. Are bowels and bladder emptying properly? Yes a. Are there any unexpected incontinence issues? No b. If applicable, is patient following bowel/bladder programs? NA 6. Any fevers, problems with breathing, unexpected pain? No 7. Are there any skin problems or new areas of breakdown? No 8. Has the patient/family member arranged specialty MD follow up (ie cardiology/neurology/renal/surgical/etc.)?  He has a scheduled appointment with Dr Pearlean Brownie. He was instructed to call his PCP to schedule HFU appointment he verbalizes understanding.  a. Can we help arrange? No 9. Does the patient need any other services or support that we can help arrange? No 10. Are caregivers following through as expected in assisting the patient? Self-Care 11. Has the patient quit smoking, drinking alcohol, or using drugs as recommended? (                        )  Appointment date/time 01/17/2021  arrival time 1:40 for 2:00 appointment with Dr Wynn Banker. At [address] [facility] [address]

## 2021-01-13 ENCOUNTER — Telehealth (HOSPITAL_COMMUNITY): Payer: Self-pay

## 2021-01-13 NOTE — Telephone Encounter (Signed)
Pharmacy Transitions of Care Follow-up Telephone Call  Date of discharge: 01/09/21 Discharge Diagnosis: CVA  How have you been since you were released from the hospital? Patient is well. No issues with meds at this time. Understands s/sx of bleeding.  Medication changes made at discharge: yes  Medication changes obtained and verified? yes    Medication Accessibility:  Home Pharmacy: [facility] [address]  Was the patient provided with refills on discharged medications? No - patient has follow up on 1/25 and has been instructed to ask for refill at this visit  . Is the patient able to afford medications? Was discharge on [facility] program. May need to switch to [facility] for future fills.    Medication Review:  CLOPIDOGREL (PLAVIX) Clopidogrel 75 mg once daily.  - Advised patient of medications to avoid (NSAIDs, ASA)  - Educated that Tylenol (acetaminophen) will be the preferred analgesic to prevent risk of bleeding  - Emphasized importance of monitoring for signs and symptoms of bleeding (abnormal bruising, prolonged bleeding, nose bleeds, bleeding from gums, discolored urine, black tarry stools)     Follow-up Appointments:  Scheduled to see Dr. Wynn Banker with Physical Medicine on 01/17/21 and Dr. Pearlean Brownie with Neurology on 03/02/21  If their condition worsens, is the pt aware to call PCP or go to the Emergency Dept.? yes  Final Patient Assessment: Patient is well. Has follow up scheduled and should receive future refills at follow up. No issues with medications at this time.

## 2021-01-17 ENCOUNTER — Encounter: Payer: Medicaid Other | Attending: Physical Medicine & Rehabilitation | Admitting: Physical Medicine & Rehabilitation

## 2021-01-17 ENCOUNTER — Telehealth: Payer: Self-pay | Admitting: Medical

## 2021-01-17 ENCOUNTER — Encounter: Payer: Self-pay | Admitting: Physical Medicine & Rehabilitation

## 2021-01-17 ENCOUNTER — Other Ambulatory Visit: Payer: Self-pay

## 2021-01-17 VITALS — BP 123/81 | HR 98 | Temp 98.8°F | Ht 69.0 in | Wt 247.0 lb

## 2021-01-17 DIAGNOSIS — I6381 Other cerebral infarction due to occlusion or stenosis of small artery: Secondary | ICD-10-CM | POA: Diagnosis not present

## 2021-01-17 NOTE — Telephone Encounter (Signed)
Recent hospitalization for stroke. Please get scheduled for follow up.

## 2021-01-17 NOTE — Patient Instructions (Addendum)
Graduated return to driving instructions were provided. It is recommended that the patient first drives with another licensed driver in an empty parking lot. If the patient does well with this, and they can drive on a quiet street with the licensed driver. If the patient does well with this they can drive on a busy street with a licensed driver. If the patient does well with this, the next time out they can go by himself. For the first month after resuming driving, I recommend no nighttime or Interstate driving.   Trigger Point Injection Trigger points are areas where you have pain. A trigger point injection is a shot given in the trigger point to help relieve pain for a few days to a few months. Common places for trigger points include:  The neck.  The shoulders.  The upper back.  The lower back. A trigger point injection will not cure long-term (chronic) pain permanently. These injections do not always work for every person. For some people, they can help to relieve pain for a few days to a few months. Tell a health care provider about:  Any allergies you have.  All medicines you are taking, including vitamins, herbs, eye drops, creams, and over-the-counter medicines.  Any problems you or family members have had with anesthetic medicines.  Any blood disorders you have.  Any surgeries you have had.  Any medical conditions you have. What are the risks? Generally, this is a safe procedure. However, problems may occur, including:  Infection.  Bleeding or bruising.  Allergic reaction to the injected medicine.  Irritation of the skin around the injection site. What happens before the procedure? Ask your health care provider about:  Changing or stopping your regular medicines. This is especially important if you are taking diabetes medicines or blood thinners.  Taking medicines such as aspirin and ibuprofen. These medicines can thin your blood. Do not take these medicines unless  your health care provider tells you to take them.  Taking over-the-counter medicines, vitamins, herbs, and supplements. What happens during the procedure?  Your health care provider will feel for trigger points. A marker may be used to circle the area for the injection.  The skin over the trigger point will be washed with a germ-killing (antiseptic) solution.  A thin needle is used for the injection. You may feel pain or a twitching feeling when the needle enters the trigger point.  A numbing solution may be injected into the trigger point. Sometimes a medicine to keep down inflammation is also injected.  Your health care provider may move the needle around the area where the trigger point is located until the tightness and twitching goes away.  After the injection, your health care provider may put gentle pressure over the injection site.  The injection site will be covered with a bandage (dressing). The procedure may vary among health care providers and hospitals.   What can I expect after treatment? After treatment, you may have:  Soreness and stiffness for 1-2 days.  A dressing. This can be taken off in a few hours or as told by your health care provider. Follow these instructions at home: Injection site care  Remove your dressing as told by your health care provider.  Check your injection site every day for signs of infection. Check for: ? Redness, swelling, or pain. ? Fluid or blood. ? Warmth. ? Pus or a bad smell. Managing pain, stiffness, and swelling  If directed, put ice on the affected area. ?  Put ice in a plastic bag. ? Place a towel between your skin and the bag. ? Leave the ice on for 20 minutes, 2-3 times a day. General instructions  If you were asked to stop your regular medicines, ask your health care provider when you may start taking them again.  Return to your normal activities as told by your health care provider. Ask your health care provider what  activities are safe for you.  Do not take baths, swim, or use a hot tub until your health care provider approves.  You may be asked to see an occupational or physical therapist for exercises that reduce muscle strain and stretch the area of the trigger point.  Keep all follow-up visits as told by your health care provider. This is important. Contact a health care provider if:  Your pain comes back, and it is worse than before the injection. You may need more injections.  You have chills or a fever.  The injection site becomes more painful, red, swollen, or warm to the touch. Summary  A trigger point injection is a shot given in the trigger point to help relieve pain for a few days to a few months.  Common places for trigger point injections are the neck, shoulder, upper back, and lower back.  These injections do not always work for every person, but for some people, the injections can help to relieve pain for a few days to a few months.  Contact a health care provider if symptoms come back or they are worse than before treatment. Also, get help if the injection site becomes more painful, red, swollen, or warm to the touch. This information is not intended to replace advice given to you by your health care provider. Make sure you discuss any questions you have with your health care provider. Document Revised: 01/21/2019 Document Reviewed: 01/21/2019 Elsevier Patient Education  2021 [employer].

## 2021-01-17 NOTE — Progress Notes (Signed)
Subjective:    Patient ID: Aaron Gibson, male    DOB: May 18, 1966, 55 y.o.   MRN: [id number] 55 y.o. right-handed male with history of untreated hypertension chronic back pain morbid obesity with BMI 40.47 and tobacco abuse.  Lives alone independent prior to admission.  He does have family in the area.  Presented 12/28/2020 with left-sided weakness and slurred speech.  Cranial CT scan negative for acute changes.  CT angiogram of head and neck with no occlusion or significant stenosis.  Patient did receive tPA.  MRI showed acute infarct within the posterior putamen posterior body of the caudate and radiating white matter tracts on the right.  No hemorrhage or mass-effect.  EEG negative for seizure.  Echocardiogram with ejection fraction 65 to 70% no wall motion abnormalities.  TEE showed ejection fraction of 60 to 65% without thrombus small PFO.  Admission chemistries unremarkable except glucose 194 hemoglobin A1c 6.5 troponin negative.  Currently maintained on aspirin Plavix x3 weeks and aspirin alone.  Subcutaneous Lovenox for DVT prophylaxis.  Due to patient's slurred speech left-sided weakness decreased functional mobility he was admitted for a comprehensive rehab program.  Admit date: 01/02/2021 Discharge date: 01/10/2021  HPI Patient is living at home assisted by his sister who accompanies him today.  The patient states that he is independent with all his self-care and mobility at this time.  He does not use an assistive device for ambulation.  He has not started physical therapy thus far.  He has not made an appointment with his PCP with thus far.  He does have an appoint with his neurologist. No falls at home Still has some pain around the right shoulder blade area  The patient has quit smoking!  He is taking his blood pressure medication his blood pressure looks good today Pain Inventory Average Pain 3 Pain Right Now 2 My pain is constant, dull and aching  LOCATION OF PAIN  Under right  shoulder blade & Mid back  BOWEL Number of stools per week: 7-10 Oral laxative use No  Type of laxative None Enema or suppository use No  History of colostomy No  Incontinent No   BLADDER Normal In and out cath, frequency None Able to self cath No  Bladder incontinence No  Frequent urination Yes  Leakage with coughing No  Difficulty starting stream No  Incomplete bladder emptying Yes    Mobility use a cane how many minutes can you walk? Unknown ability to climb steps?  yes do you drive?  no Do you have any goals in this area?  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  yes  Neuro/Psych bladder control problems weakness numbness  Prior Studies Any changes since last visit?  no New Patient  Physicians involved in your care Any changes since last visit?  no   No family history on file. Social History   Socioeconomic History  . Marital status: Single    Spouse name: Not on file  . Number of children: Not on file  . Years of education: Not on file  . Highest education level: Not on file  Occupational History  . Not on file  Tobacco Use  . Smoking status: Former Smoker    Packs/day: 1.00    Years: 29.00    Pack years: 29.00    Types: Cigarettes  .  Smokeless tobacco: Never Used  . Tobacco comment: quit 12/28/2020  Vaping Use  . Vaping Use: Never used  Substance and Sexual Activity  . Alcohol use: Yes    Comment: occasionally holidays  . Drug use: No  . Sexual activity: Yes  Other Topics Concern  . Not on file  Social History Narrative  . Not on file   Social Determinants of Health   Financial Resource Strain: Not on file  Food Insecurity: Not on file  Transportation Needs: Not on file  Physical Activity: Not on file  Stress: Not on file  Social Connections: Not on file   Past Surgical  History:  Procedure Laterality Date  . BUBBLE STUDY  01/02/2021   Procedure: BUBBLE STUDY;  Surgeon: Meriam Sprague, MD;  Location: [facility] ENDOSCOPY;  Service: Cardiovascular;;  . TEE WITHOUT CARDIOVERSION N/A 01/02/2021   Procedure: TRANSESOPHAGEAL ECHOCARDIOGRAM (TEE);  Surgeon: Meriam Sprague, MD;  Location: [facility] ENDOSCOPY;  Service: Cardiovascular;  Laterality: N/A;  . UMBILICAL HERNIA REPAIR N/A 03/15/2015   Procedure: UMBILICAL HERNIA REPAIR WITH MESH;  Surgeon: Abigail Miyamoto, MD;  Location: [facility] OR;  Service: General;  Laterality: N/A;   Past Medical History:  Diagnosis Date  . Hypertension    untreated  . Morbid obesity (HCC)    BP 123/81   Pulse 98   Temp 98.8 F (37.1 C)   Ht 5\' 9"  (1.753 m)   Wt 247 lb (112 kg)   SpO2 98%   BMI 36.48 kg/m   Opioid Risk Score:   Fall Risk Score:  `1  Depression screen PHQ 2/9  No flowsheet data found. Review of Systems  HENT: Positive for drooling.   Genitourinary:       Frequent voids  Neurological: Positive for weakness and numbness.  All other systems reviewed and are negative.      Objective:   Physical Exam Vitals and nursing note reviewed.  Constitutional:      Appearance: He is obese.  HENT:     Head: Normocephalic and atraumatic.  Eyes:     Extraocular Movements: Extraocular movements intact.     Conjunctiva/sclera: Conjunctivae normal.     Pupils: Pupils are equal, round, and reactive to light.  Cardiovascular:     Rate and Rhythm: Normal rate and regular rhythm.     Pulses: Normal pulses.     Heart sounds: Normal heart sounds. No murmur heard.   Pulmonary:     Effort: Pulmonary effort is normal. No respiratory distress.     Breath sounds: Normal breath sounds.  Abdominal:     General: Abdomen is flat. Bowel sounds are normal. There is no distension.     Palpations: Abdomen is soft.     Tenderness: There is no abdominal tenderness.  Musculoskeletal:        General: Tenderness present.      Comments: Tenderness at the medial border of the right scapula near the scapular angle  Skin:    General: Skin is warm and dry.  Neurological:     Mental Status: He is alert and oriented to person, place, and time.     Comments: Motor strength is 5/5 in the right deltoid, bicep, tricep, grip, hip flexor, knee extensor ankle dorsiflexor and plantar flexor 4/5 in the left deltoid, bicep, tricep, grip 4+ in the left hip flexor, knee extensor, ankle dorsiflexor and plantar flexor Sensation intact to light touch bilateral upper and lower limbs Tone is normal bilateral upper and lower limbs Ambulates without  assistive device no evidence of toe drag or knee instability wide-based support.  Psychiatric:        Mood and Affect: Mood normal.        Behavior: Behavior normal.        Thought Content: Thought content normal.        Judgment: Judgment normal.    Left Grip 80lb RIght grip 120 lb  Able to oppose finger to thumb bilaterally but slower on the left side than on the right side      Assessment & Plan:  #1.  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  Patient has made excellent functional recovery he is only 2 weeks post stroke.  Will make referral to outpatient therapy.  Will ask patient to see his PCP Patient already has an appointment with neurology.  I do think he is able to go back to driving with the following instructions  Graduated return to driving instructions were provided. It is recommended that the patient first drives with another licensed driver in an empty parking lot. If the patient does well with this, and they can drive on a quiet street with the licensed driver. If the patient does well with this they can drive on a busy street with a licensed driver. If the patient does well with this, the next time out they can go by himself. For the first month after resuming driving, I recommend no  nighttime or Interstate driving.   We also discussed return to school.  He is currently enrolled in an [employer] certification course.  His ultimate goal is to work for the [place] [employer] fixing up equipment prior to shifting it back to [place].  At this point he is not very optimistic that he can fill job requirements however he is only 2 weeks post stroke and we discussed that he may have some further improvement that may allow him to go back to full employment. Physical medicine rehab follow-up in 6 weeks

## 2021-01-18 NOTE — Telephone Encounter (Signed)
Called pt and lvm to return call to schedule

## 2021-01-25 ENCOUNTER — Other Ambulatory Visit: Payer: Self-pay

## 2021-01-26 ENCOUNTER — Encounter: Payer: Self-pay | Admitting: Medical

## 2021-01-26 ENCOUNTER — Ambulatory Visit: Payer: Medicaid Other | Admitting: Medical

## 2021-01-26 VITALS — BP 120/80 | HR 85 | Temp 98.9°F | Resp 18 | Ht 69.0 in | Wt 248.0 lb

## 2021-01-26 DIAGNOSIS — E785 Hyperlipidemia, unspecified: Secondary | ICD-10-CM

## 2021-01-26 DIAGNOSIS — F17200 Nicotine dependence, unspecified, uncomplicated: Secondary | ICD-10-CM | POA: Diagnosis not present

## 2021-01-26 DIAGNOSIS — I10 Essential (primary) hypertension: Secondary | ICD-10-CM

## 2021-01-26 DIAGNOSIS — Z1211 Encounter for screening for malignant neoplasm of colon: Secondary | ICD-10-CM

## 2021-01-26 DIAGNOSIS — I63311 Cerebral infarction due to thrombosis of right middle cerebral artery: Secondary | ICD-10-CM | POA: Diagnosis not present

## 2021-01-26 DIAGNOSIS — E1169 Type 2 diabetes mellitus with other specified complication: Secondary | ICD-10-CM | POA: Diagnosis not present

## 2021-01-26 DIAGNOSIS — E118 Type 2 diabetes mellitus with unspecified complications: Secondary | ICD-10-CM

## 2021-01-26 DIAGNOSIS — Z125 Encounter for screening for malignant neoplasm of prostate: Secondary | ICD-10-CM | POA: Diagnosis not present

## 2021-01-26 MED ORDER — METFORMIN HCL 500 MG PO TABS: 500.0000 mg | ORAL_TABLET | Freq: Two times a day (BID) | ORAL | 3 refills | Status: DC

## 2021-01-26 MED ORDER — BUPROPION HCL ER (XL) 150 MG PO TB24: 150.0000 mg | ORAL_TABLET | Freq: Every day | ORAL | 3 refills | Status: AC

## 2021-01-26 NOTE — Patient Instructions (Addendum)
Follow-up from hospital for recent stroke.  Glad to see that you left-sided deficits have improved remarkably.  For history of stroke continue amlodipine 10 mg, aspirin 81 mg daily and atorvastatin 80 mg daily.  Important to keep blood pressure controlled.  Recommend checking BPs daily.  Discussed technique and try to relax prior to checking.  Also discussed importance of keeping cholesterol levels controlled.  Continue atorvastatin and eat low-cholesterol diet.  Recent A1c of 6.5 which puts her in the diabetic range.  Advise low sugar diet and prescribed Metformin.  Rx advisement given.  Recent increase liver enzymes are hospitalized.  Will repeat metabolic panel today.  Also included PSAs to 4 screening prostate cancer.  Discussed portance of smoking cessation.  Prescribed Wellbutrin.  Keep follow-up appointment with neurologist.  Follow-up with me in 4 to 6 weeks or sooner if needed

## 2021-01-26 NOTE — Progress Notes (Signed)
Subjective:    Patient ID: Aaron Gibson, male    DOB: Dec 17, 1966, 55 y.o.   MRN: [id number]  HPI  Pt in for follow from hospitalization.  Admit date: 01/02/2021 Discharge date: 01/10/2021  Discharge Diagnoses:  Principal Problem:   Cerebrovascular accident (CVA) of right basal ganglia (HCC) DVT prophylaxis Sleep disturbance Hypertension Hyperlipidemia Tobacco abuse Morbid obesity Small PFO Chronic back pain  Discharged Condition: Stable  Significant Diagnostic Studies:  Imaging Results  EEG  Result Date: 12/29/2020 Charlsie Quest, MD     12/29/2020 10:46 AM Patient Name: Aaron Gibson MRN: [id number] Epilepsy Attending: Charlsie Quest Referring Physician/Provider: Dr Erick Blinks Date: 12/28/2020 Duration: 24.25 mins Patient history: 55 year old male with waxing and waning weakness in left upper extremity and left leg.  EEG to evaluate for seizures. Level of alertness: Awake, asleep AEDs during EEG study: None Technical aspects: This EEG study was done with scalp electrodes positioned according to the 10-20 International system of electrode placement. Electrical activity was acquired at a sampling rate of 500Hz  and reviewed with a high frequency filter of 70Hz  and a low frequency filter of 1Hz . EEG data were recorded continuously and digitally stored. Description: The posterior dominant rhythm consists of 10 Hz activity of moderate voltage (25-35 uV) seen predominantly in posterior head regions, symmetric and reactive to eye opening and eye closing. Sleep was characterized by vertex waves, sleep spindles (12 to 14 Hz), maximal frontocentral region. Hyperventilation and photic stimulation were not performed. IMPRESSION: This study is within normal limits. No seizures or epileptiform discharges were seen throughout the recording. Charlsie Quest   CT HEAD WO CONTRAST  Result Date: 12/29/2020 CLINICAL DATA:  Stroke, 24 hours post tPA, follow-up EXAM: CT HEAD WITHOUT CONTRAST  TECHNIQUE: Contiguous axial images were obtained from the base of the skull through the vertex without intravenous contrast. COMPARISON:  the previous day's study FINDINGS: Brain: Progressive hypoattenuation in the right basal ganglia infarct. No acute hemorrhage. No new mass effect or midline shift. No hydrocephalus. No new lesion identified. Vascular: No hyperdense vessel or unexpected calcification. Skull: No fracture or other focal lesion. Sinuses/Orbits: Mucosal thickening in maxillary sinuses. Partial opacification of left frontal sinus and bilateral ethmoid air cells. Debris in the sphenoid sinus. Other: None IMPRESSION: Progressive hypoattenuation in the non-hemorrhagic right basal ganglia infarct. No acute findings. Electronically Signed   By: Corlis Leak M.D.   On: 12/29/2020 15:01   MR BRAIN WO CONTRAST  Result Date: 12/28/2020 CLINICAL DATA:  Stroke presentation.  Left-sided deficit. EXAM: MRI HEAD WITHOUT CONTRAST TECHNIQUE: Multiplanar, multiecho pulse sequences of the brain and surrounding structures were obtained without intravenous contrast. COMPARISON:  CT studies earlier same day FINDINGS: Brain: Diffusion imaging shows hyperacute infarction within the posterior putamen, posterior body of the caudate and radiating white matter tracts on the right. Early T2 signal abnormality. No mass effect or hemorrhage. The remainder the brain appears normal without evidence of prior insult. No hydrocephalus or extra-axial collection. Vascular: Major vessels at the base of the brain show flow. Skull and upper cervical spine: Negative Sinuses/Orbits: Mucosal inflammatory changes of the paranasal sinuses, which are small on a developmental basis. Orbits negative. Other: None IMPRESSION: 1. Acute infarction within the posterior putamen, posterior body of the caudate and radiating white matter tracts on the right. No hemorrhage or mass effect. The remainder the brain appears normal without evidence of prior  insult. 2. Mucosal inflammatory changes of the paranasal sinuses, which are small on a developmental basis. Electronically  Signed   By: Paulina Fusi M.D.   On: 12/28/2020 11:23   DG Chest Portable 1 View  Result Date: 12/28/2020 CLINICAL DATA:  Chest pain. EXAM: PORTABLE CHEST 1 VIEW COMPARISON:  Chest x-ray 09/05/2018. FINDINGS: Mediastinum and hilar structures normal. Heart size normal. Low lung volumes with mild bibasilar atelectasis. No pleural effusion or pneumothorax. IMPRESSION: Low lung volumes with mild bibasilar atelectasis. Electronically Signed   By: Maisie Fus  Register   On: 12/28/2020 13:46   ECHO TEE  Result Date: 01/02/2021    TRANSESOPHOGEAL ECHO REPORT   Patient Name:   Aaron Gibson Date of Exam: 01/02/2021 Medical Rec #:  [id number]     Height:       69.0 in Accession #:    [id number]    Weight:       274.1 lb Date of Birth:  03/16/1966     BSA:          2.362 m Patient Age:    54 years      BP:           134/73 mmHg Patient Gender: M             HR:           80 bpm. Exam Location:  Inpatient Procedure: Transesophageal Echo, Cardiac Doppler and Color Doppler Indications:    CVA  History:        Patient has prior history of Echocardiogram examinations, most                 recent 12/29/2020. Stroke; Risk Factors:Hypertension,                 Dyslipidemia, Current Smoker, Diabetes and Obesity.  Sonographer:    Lavenia Atlas Referring Phys: [phone] DENISE A WOLFE PROCEDURE: The transesophogeal probe was passed without difficulty through the esophogus of the patient. Local oropharyngeal anesthetic was provided with viscous lidocaine. Sedation performed by different physician. The patient was monitored while under deep sedation. Anesthestetic sedation was provided intravenously by Anesthesiology: 319mg  of Propofol, 40mg  of Lidocaine. The patient developed no complications during the procedure. IMPRESSIONS  1. Left ventricular ejection fraction, by estimation, is 60 to 65%. The left ventricle  has normal function. The left ventricle has no regional wall motion abnormalities. There is moderate concentric left ventricular hypertrophy.  2. Right ventricular systolic function is normal. The right ventricular size is normal.  3. No left atrial/left atrial appendage thrombus was detected.  4. The mitral valve is normal in structure. Trivial mitral valve regurgitation.  5. The aortic valve is tricuspid. Aortic valve regurgitation is not visualized. No aortic stenosis is present.  6. There is mild (Grade II) plaque.  7. A small patent foramen ovale is detected by color flow Doppler. 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 FINDINGS  Left Ventricle: Left ventricular ejection fraction, by estimation, is 60 to 65%. The left ventricle has normal function. The left ventricle has no regional wall motion abnormalities. The left ventricular internal cavity size was normal in size. There is  moderate concentric left ventricular hypertrophy. Right Ventricle: The right ventricular size is normal. No increase in right ventricular wall thickness. Right ventricular systolic function is normal. Left Atrium: Left atrial size was normal in size. No left atrial/left atrial appendage thrombus was detected. Right Atrium: Right atrial size was normal in size. Pericardium: There is no evidence of pericardial effusion.  Mitral Valve: The mitral valve is normal in structure. Trivial mitral valve regurgitation. Tricuspid Valve: The tricuspid valve is normal in structure. Tricuspid valve regurgitation is trivial. Aortic Valve: The aortic valve is tricuspid. Aortic valve regurgitation is not visualized. No aortic stenosis is present. Pulmonic Valve: The pulmonic valve was normal in structure. Pulmonic valve regurgitation is not visualized. Aorta: The aortic root and ascending aorta are structurally normal, with no evidence  of dilitation. There is mild (Grade II) plaque. IAS/Shunts: Evidence of atrial level shunting detected by color flow Doppler. Agitated saline contrast was given intravenously to evaluate for intracardiac shunting. Agitated saline contrast bubble study was positive with shunting observed within 3-6 cardiac cycles suggestive of interatrial shunt. A small patent foramen ovale is detected. Laurance Flatten MD Electronically signed by Laurance Flatten MD Signature Date/Time: 01/02/2021/9:46:54 AM    Final    ECHOCARDIOGRAM COMPLETE BUBBLE STUDY  Result Date: 12/29/2020    ECHOCARDIOGRAM REPORT   Patient Name:   Aaron Gibson Date of Exam: 12/29/2020 Medical Rec #:  [id number]     Height:       69.0 in Accession #:    [id number]    Weight:       274.1 lb Date of Birth:  02/26/1966     BSA:          2.362 m Patient Age:    54 years      BP:           162/102 mmHg Patient Gender: M             HR:           72 bpm. Exam Location:  Inpatient Procedure: 2D Echo and Saline Contrast Bubble Study Indications:    stroke 434.91  History:        Patient has no prior history of Echocardiogram examinations.                 Signs/Symptoms:Chest Pain; Risk Factors:Hypertension.  Sonographer:    Delcie Roch Referring Phys: [id number] [facility] [place] KHALIQDINA IMPRESSIONS  1. Left ventricular ejection fraction, by estimation, is 65 to 70%. The left ventricle has normal function. The left ventricle has no regional wall motion abnormalities. There is moderate concentric left ventricular hypertrophy. Left ventricular diastolic parameters are consistent with Grade I diastolic dysfunction (impaired relaxation).  2. Right ventricular systolic function is normal. The right ventricular size is normal.  3. The mitral valve is normal in structure. Mild mitral valve regurgitation. No evidence of mitral stenosis.  4. The aortic valve is normal in structure. Aortic valve regurgitation is not visualized. No aortic stenosis is present.  5. The inferior  vena cava is normal in size with greater than 50% respiratory variability, suggesting right atrial pressure of 3 mmHg. Conclusion(s)/Recommendation(s): No intracardiac source of embolism detected on this transthoracic study. A transesophageal echocardiogram is recommended to exclude cardiac source of embolism if clinically indicated. FINDINGS  Left Ventricle: Left ventricular ejection fraction, by estimation, is 65 to 70%. The left ventricle has normal function. The left ventricle has no regional wall motion abnormalities. The left ventricular internal cavity size was normal in size. There is  moderate concentric left ventricular hypertrophy. Left ventricular diastolic parameters are consistent with Grade I diastolic dysfunction (impaired relaxation). Normal left ventricular filling pressure. Right Ventricle: The right ventricular size is normal. No increase in right ventricular wall thickness. Right ventricular systolic function is normal. Left Atrium: Left atrial size was normal in size. Right Atrium: Right atrial size  was normal in size. Pericardium: There is no evidence of pericardial effusion. Mitral Valve: The mitral valve is normal in structure. Mild mitral valve regurgitation. No evidence of mitral valve stenosis. Tricuspid Valve: The tricuspid valve is normal in structure. Tricuspid valve regurgitation is mild . No evidence of tricuspid stenosis. Aortic Valve: The aortic valve is normal in structure. Aortic valve regurgitation is not visualized. No aortic stenosis is present. Pulmonic Valve: The pulmonic valve was normal in structure. Pulmonic valve regurgitation is not visualized. No evidence of pulmonic stenosis. Aorta: The aortic root is normal in size and structure. Venous: The inferior vena cava is normal in size with greater than 50% respiratory variability, suggesting right atrial pressure of 3 mmHg. IAS/Shunts: No atrial level shunt detected by color flow Doppler. Agitated saline contrast was given  intravenously to evaluate for intracardiac shunting. Additional Comments: Negative bubble study.  LEFT VENTRICLE PLAX 2D LVIDd:         4.50 cm  Diastology LVIDs:         2.50 cm  LV e' medial:    6.74 cm/s LV PW:         1.00 cm  LV E/e' medial:  11.3 LV IVS:        1.20 cm  LV e' lateral:   7.29 cm/s LVOT diam:     2.40 cm  LV E/e' lateral: 10.5 LV SV:         82 LV SV Index:   35 LVOT Area:     4.52 cm  RIGHT VENTRICLE RV S prime:     13.10 cm/s TAPSE (M-mode): 2.3 cm LEFT ATRIUM             Index       RIGHT ATRIUM           Index LA diam:        3.50 cm 1.48 cm/m  RA Area:     15.20 cm LA Vol (A2C):   56.8 ml 24.05 ml/m RA Volume:   37.00 ml  15.66 ml/m LA Vol (A4C):   54.8 ml 23.20 ml/m LA Biplane Vol: 58.7 ml 24.85 ml/m  AORTIC VALVE LVOT Vmax:   85.50 cm/s LVOT Vmean:  63.000 cm/s LVOT VTI:    0.182 m  AORTA Ao Root diam: 3.20 cm Ao Asc diam:  3.40 cm MITRAL VALVE MV Area (PHT): 5.13 cm    SHUNTS MV Decel Time: 148 msec    Systemic VTI:  0.18 m MV E velocity: 76.30 cm/s  Systemic Diam: 2.40 cm MV A velocity: 64.30 cm/s MV E/A ratio:  1.19 Tobias Alexander MD Electronically signed by Tobias Alexander MD Signature Date/Time: 12/29/2020/12:02:11 PM    Final    CT HEAD CODE STROKE WO CONTRAST  Result Date: 12/28/2020 CLINICAL DATA:  Code stroke. EXAM: CT HEAD WITHOUT CONTRAST TECHNIQUE: Contiguous axial images were obtained from the base of the skull through the vertex without intravenous contrast. COMPARISON:  None. FINDINGS: Brain: No evidence of acute infarction, hemorrhage, hydrocephalus, extra-axial collection or mass lesion/mass effect. Vascular: No hyperdense vessel. Skull: Normal. Negative for fracture or focal lesion. Sinuses/Orbits: Mucosal thickening throughout the paranasal sinuses with fluid level within the sphenoid and maxillary sinuses. The orbits are maintained. ASPECTS [facility] Stroke [facility] Early CT Score) - Ganglionic level infarction (caudate, lentiform nuclei, internal capsule,  insula, M1-M3 cortex): 7 - Supraganglionic infarction (M4-M6 cortex): 3 Total score (0-10 with 10 being normal): 10 IMPRESSION: 1. No evidence of acute intracranial abnormality. 2. ASPECTS  is 10. 3. Mucosal thickening throughout the paranasal sinuses with fluid level within the sphenoid and maxillary sinuses. Correlate for acute sinusitis. Results communicated to Dr. Derry Lory on 12/28/2020 at 10:40 a.m. via [facility] paging system. Electronically Signed   By: Baldemar Lenis M.D.   On: 12/28/2020 10:42   CT ANGIO HEAD CODE STROKE  Result Date: 12/28/2020 CLINICAL DATA:  Stroke.  Left-sided deficit. EXAM: CT ANGIOGRAPHY HEAD AND NECK TECHNIQUE: Multidetector CT imaging of the head and neck was performed using the standard protocol during bolus administration of intravenous contrast. Multiplanar CT image reconstructions and MIPs were obtained to evaluate the vascular anatomy. Carotid stenosis measurements (when applicable) are obtained utilizing NASCET criteria, using the distal internal carotid diameter as the denominator. COMPARISON:  None. Head CT December 28, 2020. FINDINGS: CTA NECK FINDINGS Aortic arch: Common origin of the innominate and left common carotid artery from the aortic arch. Imaged portion shows no evidence of aneurysm or dissection. No significant stenosis of the major arch vessel origins. Right carotid system: Minimal atherosclerotic changes in the right carotid bifurcation. No evidence of dissection, stenosis (50% or greater) or occlusion. Left carotid system: No evidence of dissection, stenosis (50% or greater) or occlusion. Vertebral arteries: The right vertebral artery is hypoplastic and ends in PICA. The dominant left vertebral artery has no evidence of dissection, stenosis or occlusion. Skeleton: Negative. Other neck: Negative Upper chest: Negative. Review of the MIP images confirms the above findings CTA HEAD FINDINGS Anterior circulation: Subtle luminal irregularity  throughout the bilateral MCA and ACA vascular tree may represent mild intracranial atherosclerotic disease. No significant stenosis, proximal occlusion, aneurysm, or vascular malformation. Posterior circulation: The right vertebral artery ends in PICA. The left vertebral artery is dominant and has normal caliber. The basilar artery has small caliber throughout its entire course. Hypoplastic right P1/PCA with prominent right posterior communicating artery. Mild luminal irregularities in the bilateral PCAs may represent intracranial atherosclerotic disease without hemodynamically significant stenosis. Venous sinuses: As permitted by contrast timing, patent. Anatomic variants: Hypoplastic right P1 segment. IMPRESSION: 1. No large vessel occlusion or significant stenosis in the neck. 2. Subtle luminal irregularity throughout the bilateral MCA, ACA and PCA vascular tree may represent mild intracranial atherosclerotic disease without hemodynamically significant stenosis. Electronically Signed   By: Baldemar Lenis M.D.   On: 12/28/2020 11:04   CT ANGIO NECK CODE STROKE  Result Date: 12/28/2020 CLINICAL DATA:  Stroke.  Left-sided deficit. EXAM: CT ANGIOGRAPHY HEAD AND NECK TECHNIQUE: Multidetector CT imaging of the head and neck was performed using the standard protocol during bolus administration of intravenous contrast. Multiplanar CT image reconstructions and MIPs were obtained to evaluate the vascular anatomy. Carotid stenosis measurements (when applicable) are obtained utilizing NASCET criteria, using the distal internal carotid diameter as the denominator. COMPARISON:  None. Head CT December 28, 2020. FINDINGS: CTA NECK FINDINGS Aortic arch: Common origin of the innominate and left common carotid artery from the aortic arch. Imaged portion shows no evidence of aneurysm or dissection. No significant stenosis of the major arch vessel origins. Right carotid system: Minimal atherosclerotic changes in the  right carotid bifurcation. No evidence of dissection, stenosis (50% or greater) or occlusion. Left carotid system: No evidence of dissection, stenosis (50% or greater) or occlusion. Vertebral arteries: The right vertebral artery is hypoplastic and ends in PICA. The dominant left vertebral artery has no evidence of dissection, stenosis or occlusion. Skeleton: Negative. Other neck: Negative Upper chest: Negative. Review of the MIP images confirms the  above findings CTA HEAD FINDINGS Anterior circulation: Subtle luminal irregularity throughout the bilateral MCA and ACA vascular tree may represent mild intracranial atherosclerotic disease. No significant stenosis, proximal occlusion, aneurysm, or vascular malformation. Posterior circulation: The right vertebral artery ends in PICA. The left vertebral artery is dominant and has normal caliber. The basilar artery has small caliber throughout its entire course. Hypoplastic right P1/PCA with prominent right posterior communicating artery. Mild luminal irregularities in the bilateral PCAs may represent intracranial atherosclerotic disease without hemodynamically significant stenosis. Venous sinuses: As permitted by contrast timing, patent. Anatomic variants: Hypoplastic right P1 segment. IMPRESSION: 1. No large vessel occlusion or significant stenosis in the neck. 2. Subtle luminal irregularity throughout the bilateral MCA, ACA and PCA vascular tree may represent mild intracranial atherosclerotic disease without hemodynamically significant stenosis. Electronically Signed   By: Baldemar LenisKatyucia  De Macedo Rodrigues M.D.   On: 12/28/2020 11:04     Labs:  Basic Metabolic Panel: Last Labs      Recent Labs  Lab 01/09/21 0548  CREATININE 1.06      CBC: Last Labs   No results for input(s): WBC, NEUTROABS, HGB, HCT, MCV, PLT in the last 168 hours.    CBG: Last Labs   No results for input(s): GLUCAP in the last 168 hours.    Family history positive for  hypertension and hyperlipidemia.  Denies any colon cancer esophageal cancer or rectal cancer   Family history positive for hypertension and hyperlipidemia.  Denies any colon cancer esophageal cancer or rectal cancer  Brief HPI:   Aaron Gibson is a 55 y.o. right-handed male with history of untreated hypertension chronic back pain morbid obesity with BMI 40.47 and tobacco abuse.  Lives alone independent prior to admission.  He does have family in the area.  Presented 12/28/2020 with left-sided weakness and slurred speech.  Cranial CT scan negative for acute changes.  CT angiogram of head and neck with no occlusion or significant stenosis.  Patient did receive tPA.  MRI showed acute infarct within the posterior putamen posterior body of the caudate and radiating white matter tracts on the right.  No hemorrhage or mass-effect.  EEG negative for seizure.  Echocardiogram with ejection fraction 65 to 70% no wall motion abnormalities.  TEE showed ejection fraction of 60 to 65% without thrombus small PFO.  Admission chemistries unremarkable except glucose 194 hemoglobin A1c 6.5 troponin negative.  Currently maintained on aspirin Plavix x3 weeks and aspirin alone.  Subcutaneous Lovenox for DVT prophylaxis.  Due to patient's slurred speech left-sided weakness decreased functional mobility he was admitted for a comprehensive rehab program.    Pt since left hospital states has done well. Has gross motor/sensory function left upper and lower ext. Pt has some rare slurred speech is speaks for long time.  Hx of a1c 6.5 in hospital. Some increase frequency of urination at night.   Pt mentions that he has decreased taste. He can taste but seems decreased. Noticed maybe around 01/09/2020.   Pt has had covid vaccine.  Pt has history of smoking. Pt stats smoking about 2 cigarettes a day. Pt willing to try wellbutrin. I had written.    Review of Systems  Constitutional: Negative for chills, fatigue and fever.   HENT: Negative for congestion.   Respiratory: Negative for cough, chest tightness, shortness of breath and wheezing.   Cardiovascular: Negative for chest pain and palpitations.  Gastrointestinal: Negative for abdominal pain.  Genitourinary: Negative for enuresis.  Musculoskeletal: Negative for back pain.  Skin: Negative for  rash.  Neurological: Negative for dizziness, speech difficulty, weakness, numbness and headaches.  Hematological: Negative for adenopathy. Does not bruise/bleed easily.  Psychiatric/Behavioral: Negative for behavioral problems and confusion.   Past Medical History:  Diagnosis Date  . Hypertension    untreated  . Morbid obesity (HCC)      Social History   Socioeconomic History  . Marital status: Single    Spouse name: Not on file  . Number of children: Not on file  . Years of education: Not on file  . Highest education level: Not on file  Occupational History  . Not on file  Tobacco Use  . Smoking status: Former Smoker    Packs/day: 1.00    Years: 29.00    Pack years: 29.00    Types: Cigarettes  . Smokeless tobacco: Never Used  . Tobacco comment: quit 12/28/2020  Vaping Use  . Vaping Use: Never used  Substance and Sexual Activity  . Alcohol use: Yes    Comment: occasionally holidays  . Drug use: No  . Sexual activity: Yes  Other Topics Concern  . Not on file  Social History Narrative  . Not on file   Social Determinants of Health   Financial Resource Strain: Not on file  Food Insecurity: Not on file  Transportation Needs: Not on file  Physical Activity: Not on file  Stress: Not on file  Social Connections: Not on file  Intimate Partner Violence: Not on file    Past Surgical History:  Procedure Laterality Date  . BUBBLE STUDY  01/02/2021   Procedure: BUBBLE STUDY;  Surgeon: Meriam Sprague, MD;  Location: [facility] ENDOSCOPY;  Service: Cardiovascular;;  . TEE WITHOUT CARDIOVERSION N/A 01/02/2021   Procedure: TRANSESOPHAGEAL ECHOCARDIOGRAM  (TEE);  Surgeon: Meriam Sprague, MD;  Location[facility] ENDOSCOPY;  Service: Cardiovascular;  Laterality: N/A;  . UMBILICAL HERNIA REPAIR N/A 03/15/2015   Procedure: UMBILICAL HERNIA REPAIR WITH MESH;  Surgeon: Abigail Miyamoto, MD;  Location: [facility] OR;  Service: General;  Laterality: N/A;    No family history on file.  No Known Allergies  Current Outpatient Medications on File Prior to Visit  Medication Sig Dispense Refill  . acetaminophen (TYLENOL) 325 MG tablet Take 2 tablets (650 mg total) by mouth every 4 (four) hours as needed for mild pain (or temp > 37.5 C (99.5 F)).    Marland Kitchen amLODipine (NORVASC) 10 MG tablet Take 1 tablet (10 mg total) by mouth daily. 30 tablet 0  . aspirin 81 MG chewable tablet Chew 1 tablet (81 mg total) by mouth daily.    Marland Kitchen atorvastatin (LIPITOR) 80 MG tablet Take 1 tablet (80 mg total) by mouth daily. 30 tablet 0  . clopidogrel (PLAVIX) 75 MG tablet Take 1 tablet (75 mg total) by mouth daily. 13 tablet 0  . lidocaine (LIDODERM) 5 % Place 1 patch onto the skin daily. Remove & Discard patch within 12 hours or as directed by MD 30 patch 0  . methocarbamol (ROBAXIN) 500 MG tablet Take 1 tablet (500 mg total) by mouth every 8 (eight) hours. 60 tablet 0  . nicotine (NICODERM CQ - DOSED IN MG/24 HOURS) 14 mg/24hr patch 14 mg patch daily x2 weeks then 7 mg patch daily x3 weeks and stop 28 patch 0  . pantoprazole (PROTONIX) 40 MG tablet Take 1 tablet (40 mg total) by mouth daily. 30 tablet 0  . traZODone (DESYREL) 100 MG tablet Take 1 tablet (100 mg total) by mouth at bedtime as needed for sleep. 30  tablet 0   No current facility-administered medications on file prior to visit.    BP 120/80   Pulse 85   Temp 98.9 F (37.2 C) (Oral)   Resp 18   Ht 5\' 9"  (1.753 m)   Wt 248 lb (112.5 kg)   SpO2 96%   BMI 36.62 kg/m    Objective:   Physical Exam  General Mental Status- Alert. General Appearance- Not in acute distress.   Skin General: Color- Normal Color.  Moisture- Normal Moisture.  Neck Carotid Arteries- Normal color. Moisture- Normal Moisture. No carotid bruits. No JVD.  Chest and Lung Exam Auscultation: Breath Sounds:-Normal.  Cardiovascular Auscultation:Rythm- Regular. Murmurs & Other Heart Sounds:Auscultation of the heart reveals- No Murmurs.  Abdomen Inspection:-Inspeection Normal. Palpation/Percussion:Note:No mass. Palpation and Percussion of the abdomen reveal- Non Tender, Non Distended + BS, no rebound or guarding.    Neurologic Cranial Nerve exam:- CN III-XII intact(No nystagmus), symmetric smile. Drift Test:- No drift. Romberg Exam:- Negative.  Heal to Toe Gait exam:-Normal. Finger to Nose:- Normal/Intact Strength:-Left upper extremity strength about 4.5 out of 5.  Right side strength 5 out of 5.  Patient does note fine motor skills left hand decreased.  Lower extremity strength I think appear symmetric 5 out of 5.      Assessment & Plan:  Follow-up from hospital for recent stroke.  Glad to see that you left-sided deficits have improved remarkably.  For history of stroke continue amlodipine 10 mg, aspirin 81 mg daily and atorvastatin 80 mg daily.  Important to keep blood pressure controlled.  Recommend checking BPs daily.  Discussed technique and try to relax prior to checking.  Also discussed importance of keeping cholesterol levels controlled.  Continue atorvastatin and eat low-cholesterol diet.  Recent A1c of 6.5 which puts her in the diabetic range.  Advise low sugar diet and prescribed Metformin.  Rx advisement given.  Recent increase liver enzymes are hospitalized.  Will repeat metabolic panel today.  Also included PSAs to 4 screening prostate cancer.  Discussed portance of smoking cessation.  Prescribed Wellbutrin.  Keep follow-up appointment with neurologist.  Follow-up with me in 4 to 6 weeks or sooner if needed  .Esperanza Richters, PA-C   Time spent with patient today was  40 + minutes which  consisted of chart review, discussing diagnosie, work up, treatment and documentation.

## 2021-01-27 LAB — COMPREHENSIVE METABOLIC PANEL
ALT: 44 U/L (ref 0–53)
AST: 20 U/L (ref 0–37)
Albumin: 4.1 g/dL (ref 3.5–5.2)
Alkaline Phosphatase: 102 U/L (ref 39–117)
BUN: 11 mg/dL (ref 6–23)
CO2: 27 mEq/L (ref 19–32)
Calcium: 9.5 mg/dL (ref 8.4–10.5)
Chloride: 103 mEq/L (ref 96–112)
Creatinine, Ser: 1 mg/dL (ref 0.40–1.50)
GFR: 86.62 mL/min (ref >60.00)
Glucose, Bld: 110 mg/dL — ABNORMAL HIGH (ref 70–99)
Potassium: 4.3 mEq/L (ref 3.5–5.1)
Sodium: 140 mEq/L (ref 135–145)
Total Bilirubin: 0.7 mg/dL (ref 0.2–1.2)
Total Protein: 7.1 g/dL (ref 6.0–8.3)

## 2021-01-27 LAB — PSA: PSA: 0.76 ng/mL (ref 0.10–4.00)

## 2021-02-06 ENCOUNTER — Emergency Department (HOSPITAL_COMMUNITY)
Admission: EM | Admit: 2021-02-06 | Discharge: 2021-02-07 | Disposition: A | Discharge status: Home / Self Care | Payer: Medicaid Other | Attending: Emergency Medicine | Admitting: Emergency Medicine

## 2021-02-06 ENCOUNTER — Telehealth: Payer: Self-pay | Admitting: Medical

## 2021-02-06 ENCOUNTER — Encounter (HOSPITAL_COMMUNITY): Payer: Self-pay | Admitting: Emergency Medicine

## 2021-02-06 ENCOUNTER — Emergency Department (HOSPITAL_COMMUNITY): Payer: Medicaid Other

## 2021-02-06 DIAGNOSIS — R0902 Hypoxemia: Secondary | ICD-10-CM | POA: Diagnosis not present

## 2021-02-06 DIAGNOSIS — R531 Weakness: Secondary | ICD-10-CM | POA: Diagnosis not present

## 2021-02-06 DIAGNOSIS — Z5321 Procedure and treatment not carried out due to patient leaving prior to being seen by health care provider: Secondary | ICD-10-CM | POA: Diagnosis not present

## 2021-02-06 DIAGNOSIS — I10 Essential (primary) hypertension: Secondary | ICD-10-CM | POA: Diagnosis not present

## 2021-02-06 DIAGNOSIS — R0602 Shortness of breath: Secondary | ICD-10-CM | POA: Diagnosis not present

## 2021-02-06 DIAGNOSIS — R29810 Facial weakness: Secondary | ICD-10-CM | POA: Diagnosis not present

## 2021-02-06 LAB — CBC
HCT: 46.7 % (ref 39.0–52.0)
Hemoglobin: 15.1 g/dL (ref 13.0–17.0)
MCH: 28.6 pg (ref 26.0–34.0)
MCHC: 32.3 g/dL (ref 30.0–36.0)
MCV: 88.4 fL (ref 80.0–100.0)
Platelets: 168 10*3/uL (ref 150–400)
RBC: 5.28 MIL/uL (ref 4.22–5.81)
RDW: 12.5 % (ref 11.5–15.5)
WBC: 11.7 10*3/uL — ABNORMAL HIGH (ref 4.0–10.5)
nRBC: 0 % (ref 0.0–0.2)

## 2021-02-06 LAB — DIFFERENTIAL
Abs Immature Granulocytes: 0.03 10*3/uL (ref 0.00–0.07)
Basophils Absolute: 0 10*3/uL (ref 0.0–0.1)
Basophils Relative: 0 %
Eosinophils Absolute: 0.1 10*3/uL (ref 0.0–0.5)
Eosinophils Relative: 1 %
Immature Granulocytes: 0 %
Lymphocytes Relative: 22 %
Lymphs Abs: 2.6 10*3/uL (ref 0.7–4.0)
Monocytes Absolute: 0.9 10*3/uL (ref 0.1–1.0)
Monocytes Relative: 8 %
Neutro Abs: 8 10*3/uL — ABNORMAL HIGH (ref 1.7–7.7)
Neutrophils Relative %: 69 %

## 2021-02-06 LAB — COMPREHENSIVE METABOLIC PANEL
ALT: 34 U/L (ref 0–44)
AST: 21 U/L (ref 15–41)
Albumin: 3.5 g/dL (ref 3.5–5.0)
Alkaline Phosphatase: 90 U/L (ref 38–126)
Anion gap: 10 (ref 5–15)
BUN: 12 mg/dL (ref 6–20)
CO2: 24 mmol/L (ref 22–32)
Calcium: 9.1 mg/dL (ref 8.9–10.3)
Chloride: 106 mmol/L (ref 98–111)
Creatinine, Ser: 1.09 mg/dL (ref 0.61–1.24)
GFR, Estimated: >60 mL/min (ref >60)
Glucose, Bld: 189 mg/dL — ABNORMAL HIGH (ref 70–99)
Potassium: 3.8 mmol/L (ref 3.5–5.1)
Sodium: 140 mmol/L (ref 135–145)
Total Bilirubin: 0.5 mg/dL (ref 0.3–1.2)
Total Protein: 6.6 g/dL (ref 6.5–8.1)

## 2021-02-06 LAB — I-STAT CHEM 8, ED
BUN: 14 mg/dL (ref 6–20)
Calcium, Ion: 1.15 mmol/L (ref 1.15–1.40)
Chloride: 104 mmol/L (ref 98–111)
Creatinine, Ser: 0.9 mg/dL (ref 0.61–1.24)
Glucose, Bld: 183 mg/dL — ABNORMAL HIGH (ref 70–99)
HCT: 47 % (ref 39.0–52.0)
Hemoglobin: 16 g/dL (ref 13.0–17.0)
Potassium: 3.8 mmol/L (ref 3.5–5.1)
Sodium: 141 mmol/L (ref 135–145)
TCO2: 25 mmol/L (ref 22–32)

## 2021-02-06 LAB — CBG MONITORING, ED: Glucose-Capillary: 184 mg/dL — ABNORMAL HIGH (ref 70–99)

## 2021-02-06 LAB — PROTIME-INR
INR: 1.1 (ref 0.8–1.2)
Prothrombin Time: 14 seconds (ref 11.4–15.2)

## 2021-02-06 LAB — APTT: aPTT: 29 seconds (ref 24–36)

## 2021-02-06 IMAGING — CT CT HEAD W/O CM
4 series · 16 of 47 positions shown, 18 images · non-contrast
Comparison: CT head [DATE]

CLINICAL DATA: Neuro deficit, acute, stroke suspected. left arm
weakness this evening onset 9 pm , alert and [I9] , no
weakness

EXAM:
CT HEAD WITHOUT CONTRAST
TECHNIQUE: Contiguous axial images were obtained from the base of the skull
through the vertex without intravenous contrast.

[Series 3: head wo · axial · 0.42mm/px · z∈[-171,-56]mm · 6 of 33 slices shown, 8 images]
[im 5/33  brain]
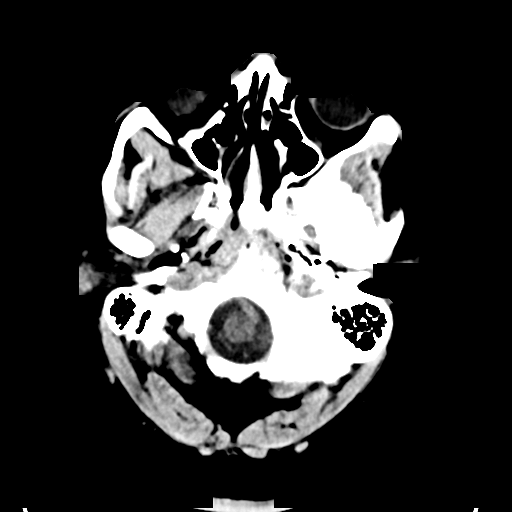
[im 5/33  bone]
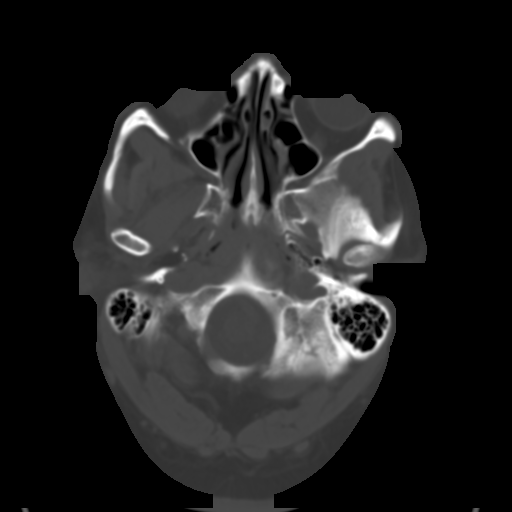
[im 10/33  brain]
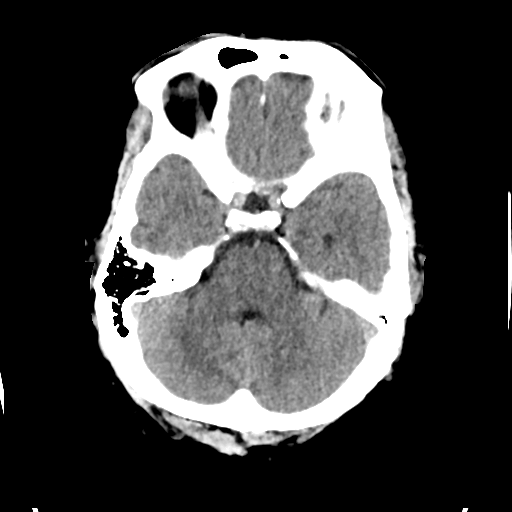
[im 14/33  brain]
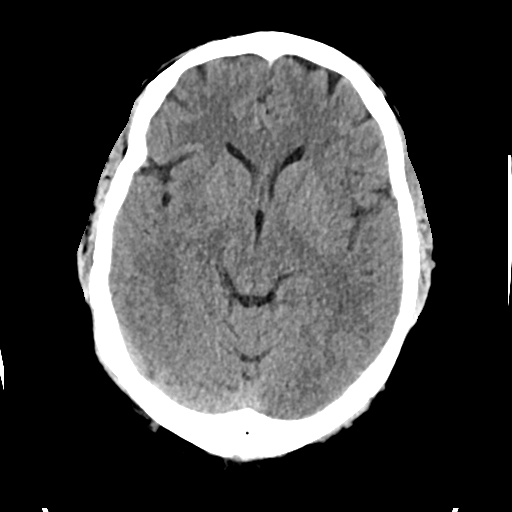
[im 19/33  brain]
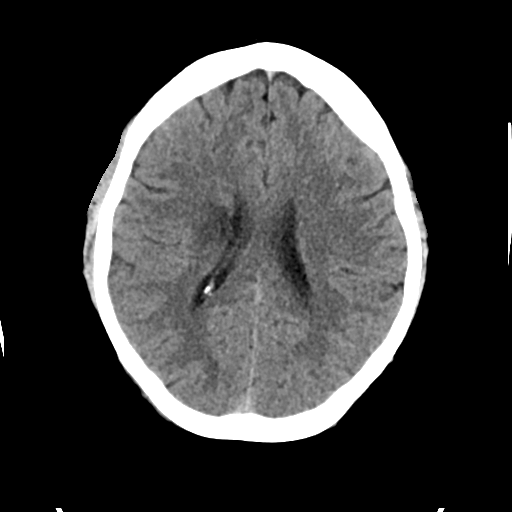
[im 23/33  brain]
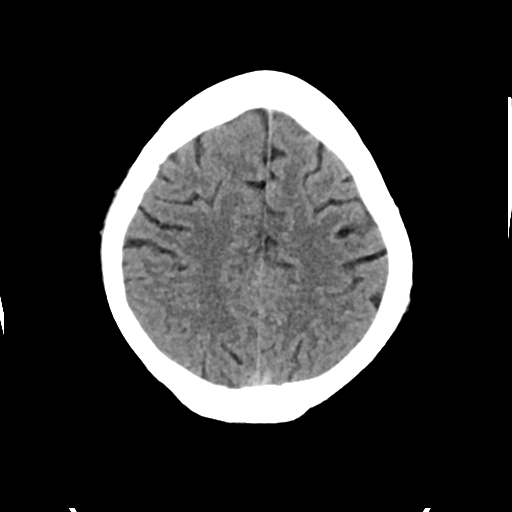
[im 23/33  bone]
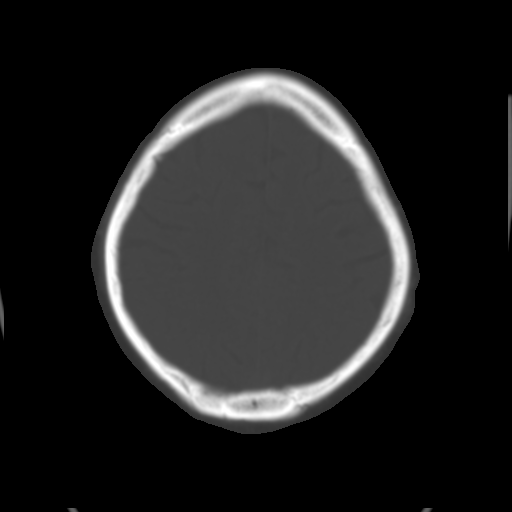
[im 28/33  brain]
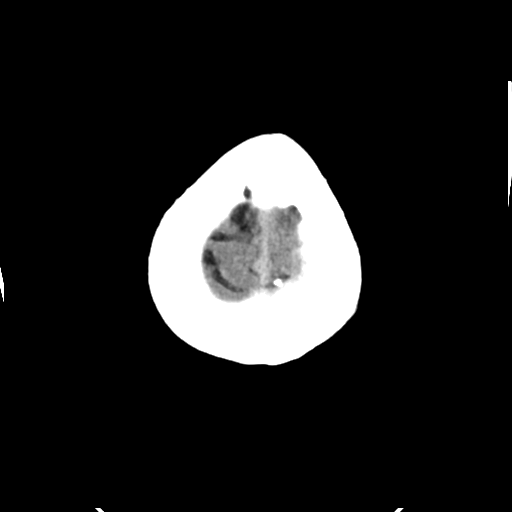

[Series 4: head bone · axial · 0.42mm/px · z∈[-177,-121]mm · 4 of 84 slices shown]
[im 8/84  bone]
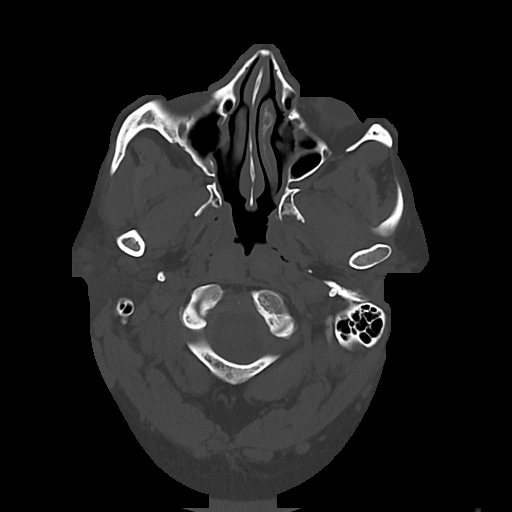
[im 16/84  bone]
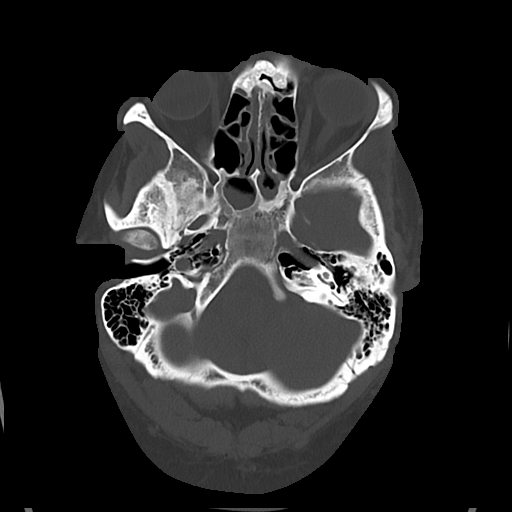
[im 28/84  bone]
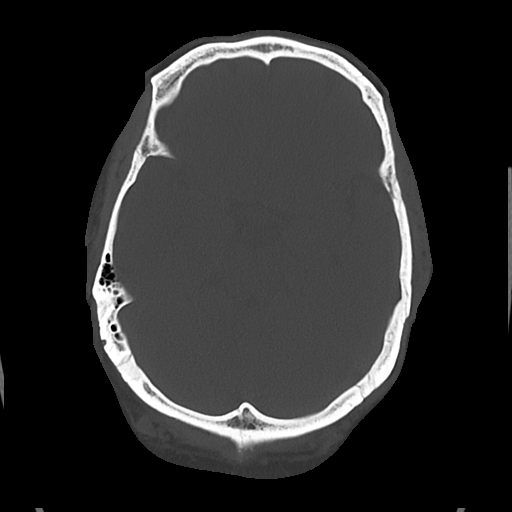
[im 36/84  bone]
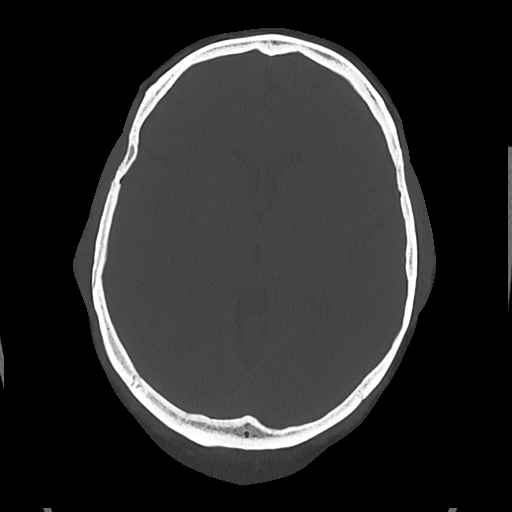

[Series 5: cor soft · coronal · 0.34mm/px · 3 of 71 slices shown]
[im 24/71  brain]
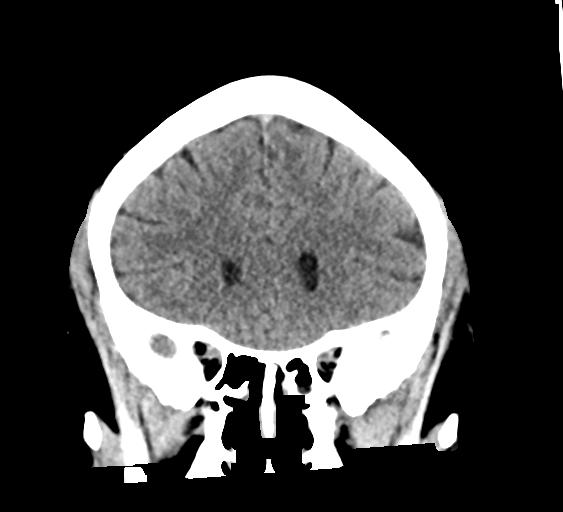
[im 32/71  brain]
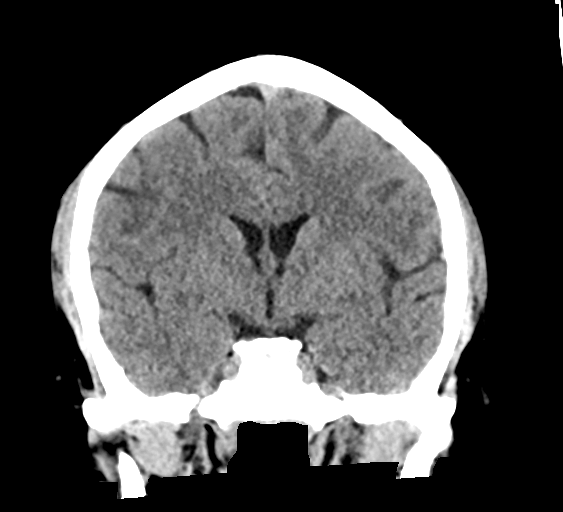
[im 39/71  brain]
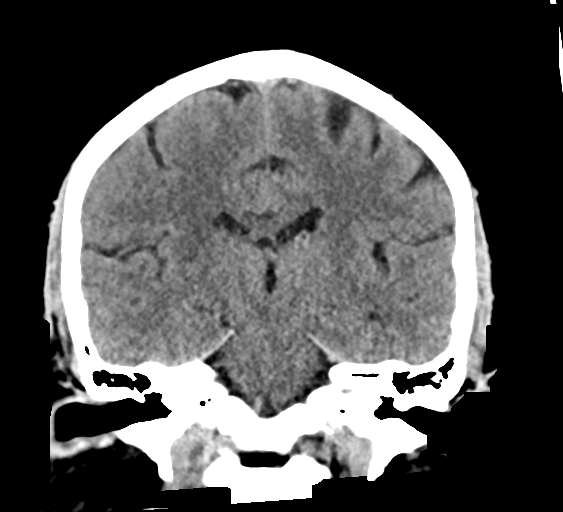

[Series 6: sag soft · sagittal · 0.34mm/px · 3 of 62 slices shown]
[im 21/62  brain]
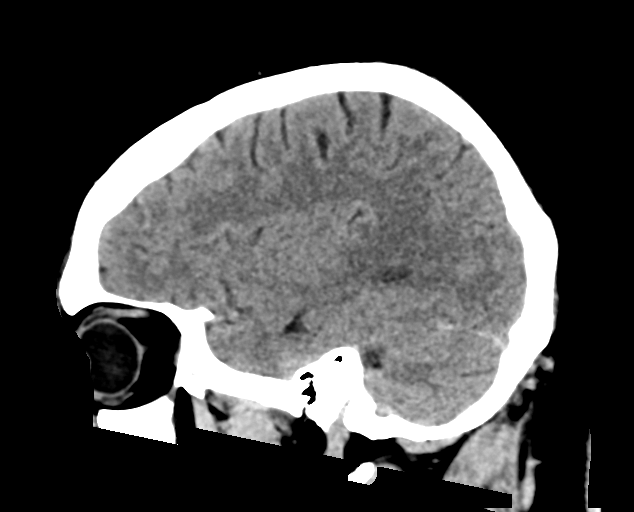
[im 31/62  brain]
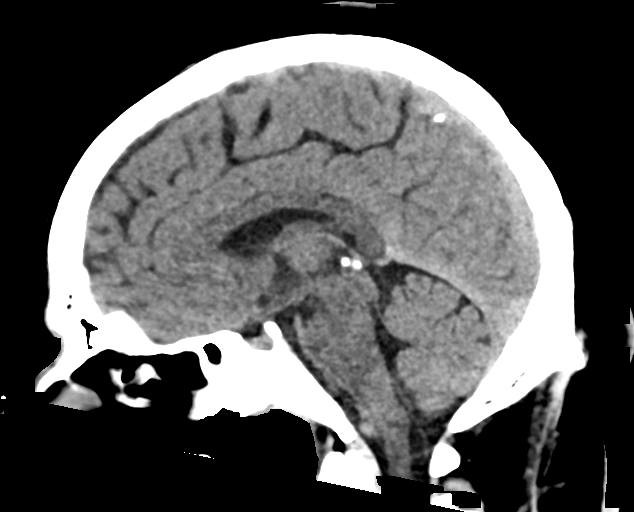
[im 41/62  brain]
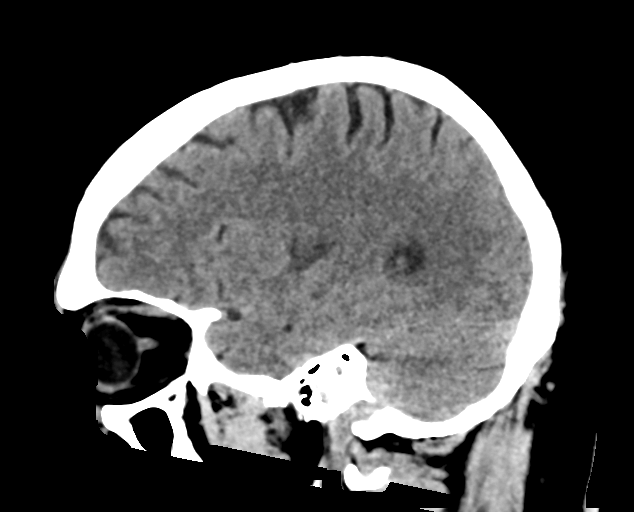

[16 of 47 positions shown; findings below may reference images not displayed]

FINDINGS: Brain:

Her physical in hypodensity of the right basal ganglia. No evidence
of large-territorial acute infarction. No parenchymal hemorrhage. No
mass lesion. No extra-axial collection.

No mass effect or midline shift. No hydrocephalus. Basilar cisterns
are patent.

Vascular: No hyperdense vessel.

Skull: No acute fracture or focal lesion.

Sinuses/Orbits: Mucosal thickening of all the paranasal sinuses. The
mastoid air cells are clear. The orbits are unremarkable.

Other: None.
IMPRESSION: No acute intracranial abnormality in a patient with chronic right
basal ganglia lacunar infarction.

## 2021-02-06 MED ORDER — SODIUM CHLORIDE 0.9% FLUSH
3.0000 mL | Freq: Once | INTRAVENOUS | Status: DC
Start: 2021-02-06 — End: 2021-02-07

## 2021-02-06 MED ORDER — ATORVASTATIN CALCIUM 80 MG PO TABS: 80.0000 mg | ORAL_TABLET | Freq: Every day | ORAL | 0 refills | Status: DC

## 2021-02-06 MED ORDER — AMLODIPINE BESYLATE 10 MG PO TABS: 10.0000 mg | ORAL_TABLET | Freq: Every day | ORAL | 0 refills | Status: DC

## 2021-02-06 NOTE — ED Triage Notes (Addendum)
Patient arrived with EMS from home reports brief left arm weakness this evening onset 9 pm , alert and orientedx4 , no weakness at arrival , speech clear/no facial asymmetry  with no arm drift  , respirations unlabored , no fever or chills . Equal strong grips . LSN 7 pm this evening , history of stroke.

## 2021-02-06 NOTE — Telephone Encounter (Signed)
Rx sent 

## 2021-02-06 NOTE — Telephone Encounter (Signed)
Medication:    amLODipine (NORVASC) 10 MG tablet [[id number]]   atorvastatin (LIPITOR) 80 MG tablet [[id number]]   Has the patient contacted their pharmacy?  (If no, request that the patient contact the pharmacy for the refill.) (If yes, when and what did the pharmacy advise?)     Preferred Pharmacy (with phone number or street name):  [id number] [place], [place] - [phone] [facility] [address] [facility] [address]  [address] [name] [address]  Phone:  [phone] Fax:  [phone]  DEA #:  [id number]    Agent: Please be advised that RX refills may take up to 3 business days. We ask that you follow-up with your pharmacy.

## 2021-02-07 NOTE — ED Notes (Signed)
Pt did not respond when called for vitals check 

## 2021-02-07 NOTE — ED Notes (Signed)
Pt did not respond when called for vitals check X3  

## 2021-02-17 ENCOUNTER — Encounter: Payer: Self-pay | Admitting: Medical

## 2021-02-17 MED ORDER — ACCU-CHEK GUIDE ME W/DEVICE KIT: PACK | 0 refills | Status: AC

## 2021-02-17 MED ORDER — ACCU-CHEK SOFTCLIX LANCETS MISC: 1 refills | Status: DC

## 2021-02-17 MED ORDER — ACCU-CHEK GUIDE VI STRP: ORAL_STRIP | 1 refills | Status: AC

## 2021-02-17 NOTE — Telephone Encounter (Signed)
Patient is scheduled for return visit on 03/03/2020 and states his insurance will cover the accu chek glucometer will send to pharmacy.

## 2021-02-28 ENCOUNTER — Encounter: Payer: Medicaid Other | Attending: Physical Medicine & Rehabilitation | Admitting: Physical Medicine & Rehabilitation

## 2021-02-28 ENCOUNTER — Other Ambulatory Visit: Payer: Self-pay

## 2021-02-28 ENCOUNTER — Encounter: Payer: Self-pay | Admitting: Physical Medicine & Rehabilitation

## 2021-02-28 VITALS — BP 148/82 | HR 84 | Temp 99.0°F | Ht 69.5 in | Wt 240.0 lb

## 2021-02-28 DIAGNOSIS — I6381 Other cerebral infarction due to occlusion or stenosis of small artery: Secondary | ICD-10-CM

## 2021-02-28 NOTE — Patient Instructions (Signed)

## 2021-02-28 NOTE — Progress Notes (Signed)
Subjective:    Patient ID: Aaron Gibson, male    DOB: 11/12/1966, 55 y.o.   MRN: [id number] 55 y.o. right-handed male with history of untreated hypertension chronic back pain morbid obesity with BMI 40.47 and tobacco abuse.  Lives alone independent prior to admission.  He does have family in the area.  Presented 12/28/2020 with left-sided weakness and slurred speech.  Cranial CT scan negative for acute changes.  CT angiogram of head and neck with no occlusion or significant stenosis.  Patient did receive tPA.  MRI showed acute infarct within the posterior putamen posterior body of the caudate and radiating white matter tracts on the right.  No hemorrhage or mass-effect.  EEG negative for seizure.  Echocardiogram with ejection fraction 65 to 70% no wall motion abnormalities.  TEE showed ejection fraction of 60 to 65% without thrombus small PFO.  Admission chemistries unremarkable except glucose 194 hemoglobin A1c 6.5 troponin negative.  Currently maintained on aspirin Plavix x3 weeks and aspirin alone.  Subcutaneous Lovenox for DVT prophylaxis.  Due to patient's slurred speech left-sided weakness decreased functional mobility he was admitted for a comprehensive rehab program.  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 RSima Matasin the past while hospitalized.  He would like to see him again and has appointment already scheduled Pain Inventory Average Pain 1 Pain Right Now 1 My pain is dull  LOCATION OF PAIN    BOWEL Number of stools per week: 7 Oral laxative use No  Type of laxative  Enema or suppository use No  History of colostomy No   Incontinent No   BLADDER Normal In and out cath, frequency  Able to self cath n/a Bladder incontinence No  Frequent urination Yes  Leakage with coughing No  Difficulty starting stream No  Incomplete bladder emptying No    Mobility walk with assistance use a cane how many minutes can you walk? 60 ability to climb steps?  yes do you drive?  yes Do you have any goals in this area?  yes  Function not employed: date last employed .  Neuro/Psych trouble walking  Prior Studies Any changes since last visit?  no  Physicians involved in your care Any changes since last visit?  no   No family history on file. Social History   Socioeconomic History  . Marital status: Single    Spouse name: Not on file  . Number of children: Not on file  . Years of education: Not on file  . Highest education level: Not on file  Occupational History  . Not on file  Tobacco Use  . Smoking status: Former Smoker    Packs/day: 1.00    Years: 29.00    Pack years: 29.00    Types: Cigarettes  . Smokeless tobacco: Never Used  . Tobacco comment: quit 12/28/2020  Vaping Use  . Vaping Use: Never used  Substance and Sexual Activity  . Alcohol use: Yes    Comment: occasionally holidays  . Drug use: No  . Sexual activity: Yes  Other Topics Concern  . Not on file  Social History Narrative  . Not on file   Social Determinants of Health   Financial Resource Strain: Not on file  Food Insecurity: Not on file  Transportation Needs: Not on file  Physical Activity: Not on file  Stress: Not on file  Social Connections: Not on file   Past Surgical History:  Procedure Laterality Date  . BUBBLE STUDY  01/02/2021   Procedure: BUBBLE STUDY;  Surgeon: Freada Bergeron, MD;  Location: [facility];  Service: Cardiovascular;;  . TEE WITHOUT CARDIOVERSION N/A 01/02/2021   Procedure: TRANSESOPHAGEAL ECHOCARDIOGRAM (TEE);  Surgeon: Freada Bergeron, MD;  Location: [facility];  Service:  Cardiovascular;  Laterality: N/A;  . UMBILICAL HERNIA REPAIR N/A 03/15/2015   Procedure: UMBILICAL HERNIA REPAIR WITH MESH;  Surgeon: Coralie Keens, MD;  Location: [facility];  Service: General;  Laterality: N/A;   Past Medical History:  Diagnosis Date  . Hypertension    untreated  . Morbid obesity (HCC)    BP (!) 148/82   Pulse 84   Temp 99 F (37.2 C)   Ht 5' 9.5" (1.765 m)   Wt 240 lb (108.9 kg)   SpO2 96%   BMI 34.93 kg/m   Opioid Risk Score:   Fall Risk Score:  `1  Depression screen PHQ 2/9  Depression screen PHQ 2/9 01/17/2021  Decreased Interest 0  Down, Depressed, Hopeless 0  PHQ - 2 Score 0  Altered sleeping 1  Tired, decreased energy 1  Change in appetite 1  Feeling bad or failure about yourself  0  Trouble concentrating 0  Moving slowly or fidgety/restless 0  Suicidal thoughts 0  PHQ-9 Score 3    Review of Systems  Constitutional: Negative.   HENT: Negative.   Eyes: Negative.   Respiratory: Negative.   Cardiovascular: Negative.   Gastrointestinal: Negative.   Endocrine: Negative.   Genitourinary: Negative.   Musculoskeletal: Positive for gait problem.  Skin: Negative.   Allergic/Immunologic: Negative.   Hematological: Negative.   Psychiatric/Behavioral: Negative.   All other systems reviewed and are negative.      Objective:   Physical Exam Constitutional:      Appearance: He is obese.  HENT:     Head: Normocephalic and atraumatic.  Eyes:     General: No visual field deficit.    Extraocular Movements: Extraocular movements intact.     Conjunctiva/sclera: Conjunctivae normal.     Pupils: Pupils are equal, round, and reactive to light.  Musculoskeletal:        General: No tenderness or deformity.     Right lower leg: No edema.     Left lower leg: No edema.  Skin:    General: Skin is warm and dry.  Neurological:     Mental Status: He is alert and oriented to person, place, and time.     Cranial Nerves: No cranial nerve deficit,  dysarthria or facial asymmetry.     Sensory: Sensation is intact.     Motor: Motor function is intact.     Coordination: Coordination normal.     Gait: Gait and tandem walk normal.     Comments: Motor 5/5 in bilateral Delt, BI, Tri , Grip, HF, KE, ADF  Psychiatric:        Mood and Affect: Mood normal.        Behavior: Behavior normal.   Patient has normal lumbar flexion and extension.  Mild pain to palpation L4 paraspinal area Negative straight leg raise Normal sensation of the lower extremities  Assessment & Plan:  #1.  History of right caudate and putamen infarcts with no significant residual deficit.  The patient notes that with weight lifting he still is weaker on the left side by approximately 50%.  He has no abnormal motor tone or apparent motor control issues.  He would have difficulty in a job that requires moderate to heavy physical lifting capabilities.  He will follow up with neuropsych.  The patient will call me to set up an appointment. We discussed exercise principles including proper form, not cheating.  His core strength is probably suboptimal at this point  #2.  Back pain without sciatica.  Pain does not appear to be directional.  It is at the L4 paraspinal area.  I have given some exercises to do on a daily basis.

## 2021-03-02 ENCOUNTER — Ambulatory Visit: Payer: Medicaid Other | Admitting: Neurology

## 2021-03-02 ENCOUNTER — Encounter: Payer: Self-pay | Admitting: Neurology

## 2021-03-02 VITALS — BP 137/88 | HR 77 | Ht 69.0 in | Wt 239.8 lb

## 2021-03-02 DIAGNOSIS — I639 Cerebral infarction, unspecified: Secondary | ICD-10-CM

## 2021-03-02 DIAGNOSIS — R29898 Other symptoms and signs involving the musculoskeletal system: Secondary | ICD-10-CM | POA: Diagnosis not present

## 2021-03-02 NOTE — Patient Instructions (Signed)
I had a long d/w patient about his recent  Cryptogenic stroke, sleep apnoea risk for recurrent stroke/TIAs, personally independently reviewed imaging studies and stroke evaluation results and answered questions.Continue aspirin 81 mg daily  for secondary stroke prevention and maintain strict control of hypertension with blood pressure goal below 130/90, diabetes with hemoglobin A1c goal below 6.5% and lipids with LDL cholesterol goal below 70 mg/dL. I also advised the patient to eat a healthy diet with plenty of whole grains, cereals, fruits and vegetables, exercise regularly and maintain ideal body weight .plan to check 30-day heart monitor for paroxysmal A. fib.  Continue follow-up for the sleep smart stroke prevention study for sleep apnea as per study protocol.  Followup in the future with my nurse practitioner Shanda Bumps in 6 months or call earlier if necessary.   Stroke Prevention Some medical conditions and behaviors are associated with a higher chance of having a stroke. You can help prevent a stroke by making nutrition, lifestyle, and other changes, including managing any medical conditions you may have. What nutrition changes can be made?  Eat healthy foods. You can do this by: ? Choosing foods high in fiber, such as fresh fruits and vegetables and whole grains. ? Eating at least 5 or more servings of fruits and vegetables a day. Try to fill half of your plate at each meal with fruits and vegetables. ? Choosing lean protein foods, such as lean cuts of meat, poultry without skin, fish, tofu, beans, and nuts. ? Eating low-fat dairy products. ? Avoiding foods that are high in salt (sodium). This can help lower blood pressure. ? Avoiding foods that have saturated fat, trans fat, and cholesterol. This can help prevent high cholesterol. ? Avoiding processed and premade foods.  Follow your health care provider's specific guidelines for losing weight, controlling high blood pressure (hypertension),  lowering high cholesterol, and managing diabetes. These may include: ? Reducing your daily calorie intake. ? Limiting your daily sodium intake to 1,500 milligrams (mg). ? Using only healthy fats for cooking, such as olive oil, canola oil, or sunflower oil. ? Counting your daily carbohydrate intake.   What lifestyle changes can be made?  Maintain a healthy weight. Talk to your health care provider about your ideal weight.  Get at least 30 minutes of moderate physical activity at least 5 days a week. Moderate activity includes brisk walking, biking, and swimming.  Do not use any products that contain nicotine or tobacco, such as cigarettes and e-cigarettes. If you need help quitting, ask your health care provider. It may also be helpful to avoid exposure to secondhand smoke.  Limit alcohol intake to no more than 1 drink a day for nonpregnant women and 2 drinks a day for men. One drink equals 12 oz of beer, 5 oz of wine, or 1 oz of hard liquor.  Stop any illegal drug use.  Avoid taking birth control pills. Talk to your health care provider about the risks of taking birth control pills if: ? You are over 47 years old. ? You smoke. ? You get migraines. ? You have ever had a blood clot. What other changes can be made?  Manage your cholesterol levels. ? Eating a healthy diet is important for preventing high cholesterol. If cholesterol cannot be managed through diet alone, you may also need to take medicines. ? Take any prescribed medicines to control your cholesterol as told by your health care provider.  Manage your diabetes. ? Eating a healthy diet and exercising regularly are  important parts of managing your blood sugar. If your blood sugar cannot be managed through diet and exercise, you may need to take medicines. ? Take any prescribed medicines to control your diabetes as told by your health care provider.  Control your hypertension. ? To reduce your risk of stroke, try to keep your  blood pressure below 130/80. ? Eating a healthy diet and exercising regularly are an important part of controlling your blood pressure. If your blood pressure cannot be managed through diet and exercise, you may need to take medicines. ? Take any prescribed medicines to control hypertension as told by your health care provider. ? Ask your health care provider if you should monitor your blood pressure at home. ? Have your blood pressure checked every year, even if your blood pressure is normal. Blood pressure increases with age and some medical conditions.  Get evaluated for sleep disorders (sleep apnea). Talk to your health care provider about getting a sleep evaluation if you snore a lot or have excessive sleepiness.  Take over-the-counter and prescription medicines only as told by your health care provider. Aspirin or blood thinners (antiplatelets or anticoagulants) may be recommended to reduce your risk of forming blood clots that can lead to stroke.  Make sure that any other medical conditions you have, such as atrial fibrillation or atherosclerosis, are managed. What are the warning signs of a stroke? The warning signs of a stroke can be easily remembered as BEFAST.  B is for balance. Signs include: ? Dizziness. ? Loss of balance or coordination. ? Sudden trouble walking.  E is for eyes. Signs include: ? A sudden change in vision. ? Trouble seeing.  F is for face. Signs include: ? Sudden weakness or numbness of the face. ? The face or eyelid drooping to one side.  A is for arms. Signs include: ? Sudden weakness or numbness of the arm, usually on one side of the body.  S is for speech. Signs include: ? Trouble speaking (aphasia). ? Trouble understanding.  T is for time. ? These symptoms may represent a serious problem that is an emergency. Do not wait to see if the symptoms will go away. Get medical help right away. Call your local emergency services (911 in the [place] Do not  drive yourself to the hospital.  Other signs of stroke may include: ? A sudden, severe headache with no known cause. ? Nausea or vomiting. ? Seizure. Where to find more information For more information, visit:  American Stroke Association: www.strokeassociation.org  National Stroke Association: www.stroke.org Summary  You can prevent a stroke by eating healthy, exercising, not smoking, limiting alcohol intake, and managing any medical conditions you may have.  Do not use any products that contain nicotine or tobacco, such as cigarettes and e-cigarettes. If you need help quitting, ask your health care provider. It may also be helpful to avoid exposure to secondhand smoke.  Remember BEFAST for warning signs of stroke. Get help right away if you or a loved one has any of these signs. This information is not intended to replace advice given to you by your health care provider. Make sure you discuss any questions you have with your health care provider. Document Revised: 11/22/2017 Document Reviewed: 01/15/2017 Elsevier Patient Education  2021 [employer].

## 2021-03-02 NOTE — Progress Notes (Signed)
[facility] [address] [place]. [place] [address] [phone]       OFFICE FOLLOW-UP NOTE  Mr. Tylar Amborn Date of Birth:  [birth year] Medical Record Number:  [id number]   HPI: Mr. Nall is a 55 year old African-American male seen today for initial office follow-up visit following hospital admission for stroke in January 2022.Kameron Blethen is a 55 y.o. male who was evaluated as a Code Stroke in the ED. Hypertensive crisis noted with BP  220/110. TPA was administered. Head CT showed no acute intracranial abnormality. MR Brain revealed acute infarction within the posterior putamen, posterior body of the caudate and radiating white matter tracts on the right. CTA head/ neck showed no large vessel occlusion. EEG was normal without seizure activity. TEE showed a small patent foramen ovale and EF 60-75%.   Patient had a low rope score of 3-4 hence was not considered for PFO closure.  LDL cholesterol was elevated at 138 mg percent and he was started on Lipitor 80 mg daily.  Hemoglobin A1c was 6.5. Patient was not eligible for loop recorder due to lack of health insurance coverage. DAPT was provided throughout his stay. Covid vaccine was administered.  He remained hemodynamically and neurologically stable throughout his stay. Some improvement in left sided weakness was appreciated. Physical, speech, and occupational therapists were consulted and provided evaluation, treatment and discharge planning recommendations. He was recommended for inpatient rehabilitation which was arranged by the care management team. . Patient states is done well since discharge.  He still has mild left grip weakness and diminished fine motor skills but otherwise is getting better.  He did not do any outpatient therapies.  He has discontinued Plavix and is on aspirin alone which is tolerating well without bruising or bleeding.  His blood pressure is well controlled today it is 137/88.  His fasting sugars are  also okay and he is plans to see his primary care physician this week to discuss further management.  He remains on Lipitor 80 mg which is tolerating well without muscle aches and pains.  Patient and could not afford a loop recorder monitoring for A. fib.  He was enrolled in the sleep smart study but randomized to the medical treatment. ROS:   14 system review of systems is positive for hand weakness, diminished fine motor skills all other systems negative  PMH:  Past Medical History:  Diagnosis Date  . Hypertension    untreated  . Morbid obesity ([place])     Social History:  Social History   Socioeconomic History  . Marital status: Single    Spouse name: Not on file  . Number of children: Not on file  . Years of education: Not on file  . Highest education level: Not on file  Occupational History  . Occupation: Unemployed   Tobacco Use  . Smoking status: Former Smoker    Packs/day: 1.00    Years: 29.00    Pack years: 29.00    Types: Cigarettes  . Smokeless tobacco: Never Used  . Tobacco comment: quit 12/28/2020  Vaping Use  . Vaping Use: Never used  Substance and Sexual Activity  . Alcohol use: Yes    Comment: occasionally holidays  . Drug use: No  . Sexual activity: Yes  Other Topics Concern  . Not on file  Social History Narrative   Lives with daughter   Right Handed   Caffeine 2x week   Social Determinants of Health   Financial Resource Strain: Not on file  Food  Insecurity: Not on file  Transportation Needs: Not on file  Physical Activity: Not on file  Stress: Not on file  Social Connections: Not on file  Intimate Partner Violence: Not on file    Medications:   Current Outpatient Medications on File Prior to Visit  Medication Sig Dispense Refill  . Accu-Chek Softclix Lancets lancets Use to check Blood sugars once a day. Dx E11.8 100 each 1  . acetaminophen (TYLENOL) 325 MG tablet Take 2 tablets (650 mg total) by mouth every 4 (four) hours as needed for mild  pain (or temp > 37.5 C (99.5 F)).    Marland Kitchen amLODipine (NORVASC) 10 MG tablet Take 1 tablet (10 mg total) by mouth daily. 30 tablet 0  . aspirin 81 MG chewable tablet Chew 1 tablet (81 mg total) by mouth daily.    Marland Kitchen atorvastatin (LIPITOR) 80 MG tablet Take 1 tablet (80 mg total) by mouth daily. 30 tablet 0  . Blood Glucose Monitoring Suppl (ACCU-CHEK GUIDE ME) w/Device KIT Use to check Blood sugars once a day. Dx E11.8 1 kit 0  . buPROPion (WELLBUTRIN XL) 150 MG 24 hr tablet Take 1 tablet (150 mg total) by mouth daily. 30 tablet 3  . clopidogrel (PLAVIX) 75 MG tablet Take 1 tablet (75 mg total) by mouth daily. 13 tablet 0  . glucose blood (ACCU-CHEK GUIDE) test strip Use to check Blood sugars once a day. Dx E11.8 100 each 1  . lidocaine (LIDODERM) 5 % Place 1 patch onto the skin daily. Remove & Discard patch within 12 hours or as directed by MD 30 patch 0  . metFORMIN (GLUCOPHAGE) 500 MG tablet Take 1 tablet (500 mg total) by mouth 2 (two) times daily with a meal. 60 tablet 3  . methocarbamol (ROBAXIN) 500 MG tablet Take 1 tablet (500 mg total) by mouth every 8 (eight) hours. 60 tablet 0  . pantoprazole (PROTONIX) 40 MG tablet Take 1 tablet (40 mg total) by mouth daily. 30 tablet 0  . traZODone (DESYREL) 100 MG tablet Take 1 tablet (100 mg total) by mouth at bedtime as needed for sleep. 30 tablet 0   No current facility-administered medications on file prior to visit.    Allergies:  No Known Allergies  Physical Exam General: Obese middle-aged African-American male seated, in no evident distress Head: head normocephalic and atraumatic.  Neck: supple with no carotid or supraclavicular bruits Cardiovascular: regular rate and rhythm, no murmurs Musculoskeletal: no deformity Skin:  no rash/petichiae Vascular:  Normal pulses all extremities Vitals:   03/02/21 0916  BP: 137/88  Pulse: 77   Neurologic Exam Mental Status: Awake and fully alert. Oriented to place and time. Recent and remote memory  intact. Attention span, concentration and fund of knowledge appropriate. Mood and affect appropriate.  Cranial Nerves: Fundoscopic exam reveals sharp disc margins. Pupils equal, briskly reactive to light. Extraocular movements full without nystagmus. Visual fields full to confrontation. Hearing intact. Facial sensation intact. Face, tongue, palate moves normally and symmetrically.  Motor: Normal bulk and tone. Normal strength in all tested extremity muscles except mild weakness of left grip and intrinsic hand muscles.  Diminished fine finger movements on the left.  Orbits right over left upper extremity.. Sensory.: intact to touch ,pinprick .position and vibratory sensation.  Coordination: Rapid alternating movements normal in all extremities. Finger-to-nose and heel-to-shin performed accurately bilaterally. Gait and Station: Arises from chair without difficulty. Stance is normal. Gait demonstrates normal stride length and balance . Able to heel, toe and tandem  walk without difficulty.  Reflexes: 1+ and symmetric. Toes downgoing.   NIHSS  0 Modified Rankin  2   ASSESSMENT: 55 year old African-American male with right large basal ganglia infarct of cryptogenic etiology in January 2022.  Vascular risk factors of obesity, sleep apnea, diabetes, hypertension, hyperlipidemia and a small PFO.     PLAN: I had a long d/w patient about his recent  Cryptogenic stroke, sleep apnoea risk for recurrent stroke/TIAs, personally independently reviewed imaging studies and stroke evaluation results and answered questions.Continue aspirin 81 mg daily  for secondary stroke prevention and maintain strict control of hypertension with blood pressure goal below 130/90, diabetes with hemoglobin A1c goal below 6.5% and lipids with LDL cholesterol goal below 70 mg/dL. I also advised the patient to eat a healthy diet with plenty of whole grains, cereals, fruits and vegetables, exercise regularly and maintain ideal body weight  .plan to check 30-day heart monitor for paroxysmal A. fib.  Continue follow-up for the sleep smart stroke prevention study for sleep apnea as per study protocol.  Followup in the future with my nurse practitioner Janett Billow in 6 months or call earlier if necessary.  Greater than 50% of time during this 25 minute visit was spent on counseling,explanation of diagnosis of cryptogenic stroke, planning of further management, discussion with patient and family and coordination of care Antony Contras, MD Note: This document was prepared with digital dictation and possible smart phrase technology. Any transcriptional errors that result from this process are unintentional

## 2021-03-03 ENCOUNTER — Other Ambulatory Visit: Payer: Self-pay

## 2021-03-03 ENCOUNTER — Encounter: Payer: Self-pay | Admitting: Internal Medicine

## 2021-03-03 ENCOUNTER — Ambulatory Visit (INDEPENDENT_AMBULATORY_CARE_PROVIDER_SITE_OTHER): Payer: Medicaid Other | Admitting: Medical

## 2021-03-03 VITALS — BP 132/82 | HR 97 | Resp 20 | Ht 69.0 in | Wt 238.4 lb

## 2021-03-03 DIAGNOSIS — I63311 Cerebral infarction due to thrombosis of right middle cerebral artery: Secondary | ICD-10-CM

## 2021-03-03 DIAGNOSIS — E785 Hyperlipidemia, unspecified: Secondary | ICD-10-CM

## 2021-03-03 DIAGNOSIS — I10 Essential (primary) hypertension: Secondary | ICD-10-CM | POA: Diagnosis not present

## 2021-03-03 DIAGNOSIS — F17200 Nicotine dependence, unspecified, uncomplicated: Secondary | ICD-10-CM | POA: Diagnosis not present

## 2021-03-03 DIAGNOSIS — E1169 Type 2 diabetes mellitus with other specified complication: Secondary | ICD-10-CM | POA: Diagnosis not present

## 2021-03-03 DIAGNOSIS — E118 Type 2 diabetes mellitus with unspecified complications: Secondary | ICD-10-CM | POA: Diagnosis not present

## 2021-03-03 NOTE — Patient Instructions (Signed)
History of hypertension.  Your blood pressure is controlled today.  Continue amlodipine 10 mg daily.  History of hyperlipidemia.  Continue atorvastatin 80 mg daily.  History of stroke with good neurologic exam today.  Continue on aspirin and other recommendations advised by neurologist on yesterday's visit.   History of diabetes and last A1c was 6.5.  Continue low sugar diet and Metformin 500 mg twice daily.  Glad to hear that you have reduced amount of smoking.  Can continue Wellbutrin.  Stopping smoking completely will help reduce your overall cardiovascular risk.  Do recommend follow through with Zio patch monitor advised by neurologist.  Placing future CMP, lipid panel and A1c.  I recommend getting scheduled third week of April.  Follow-up in approximately 2 months or as needed.

## 2021-03-03 NOTE — Progress Notes (Signed)
Subjective:    Patient ID: Aaron Gibson, male    DOB: November 28, 1966, 55 y.o.   MRN: [id number]  HPI  Pt in for follow up.  Pt saw Dr Leonie Man yesterday.   A/P from neuro reads below. "I had a long d/w patient about his recent  Cryptogenic stroke, sleep apnoea risk for recurrent stroke/TIAs, personally independently reviewed imaging studies and stroke evaluation results and answered questions.Continue aspirin 81 mg daily  for secondary stroke prevention and maintain strict control of hypertension with blood pressure goal below 130/90, diabetes with hemoglobin A1c goal below 6.5% and lipids with LDL cholesterol goal below 70 mg/dL. I also advised the patient to eat a healthy diet with plenty of whole grains, cereals, fruits and vegetables, exercise regularly and maintain ideal body weight .plan to check 30-day heart monitor for paroxysmal A. fib.  Continue follow-up for the sleep smart stroke prevention study for sleep apnea as per study protocol.  Followup in the future with my nurse practitioner Janett Billow in 6 months or call earlier if necessary."   Pt has htn. He is on amlodipine 10 mg daily.  High cholesterol. On atorvastatin 80 mg daily.  Diabetes hx. On metformin 500 mg twice a day. Last a1c was 6.5 2 months ago.  Pt has history of smoking. I prescribed wellbutrin. Now only 3-4 cigarettes. He was smoking a pack a day.   Post stroke he states altered taste. He states taste sensation is coming back slowly.  Pt just now getting scheduled.   Review of Systems  Constitutional: Negative for chills, fatigue and fever.  Respiratory: Negative for cough, chest tightness, shortness of breath, wheezing and stridor.   Cardiovascular: Negative for chest pain and palpitations.  Gastrointestinal: Negative for abdominal pain, constipation, diarrhea, nausea and vomiting.  Genitourinary: Negative for difficulty urinating and enuresis.  Musculoskeletal: Negative for back pain and myalgias.  Skin:  Negative for rash.  Neurological: Negative for dizziness, speech difficulty, weakness, numbness and headaches.  Hematological: Negative for adenopathy. Does not bruise/bleed easily.  Psychiatric/Behavioral: Negative for behavioral problems and confusion.     Past Medical History:  Diagnosis Date  . Hypertension    untreated  . Morbid obesity ([place])      Social History   Socioeconomic History  . Marital status: Single    Spouse name: Not on file  . Number of children: Not on file  . Years of education: Not on file  . Highest education level: Not on file  Occupational History  . Occupation: Unemployed   Tobacco Use  . Smoking status: Former Smoker    Packs/day: 1.00    Years: 29.00    Pack years: 29.00    Types: Cigarettes  . Smokeless tobacco: Never Used  . Tobacco comment: quit 12/28/2020  Vaping Use  . Vaping Use: Never used  Substance and Sexual Activity  . Alcohol use: Yes    Comment: occasionally holidays  . Drug use: No  . Sexual activity: Yes  Other Topics Concern  . Not on file  Social History Narrative   Lives with daughter   Right Handed   Caffeine 2x week   Social Determinants of Health   Financial Resource Strain: Not on file  Food Insecurity: Not on file  Transportation Needs: Not on file  Physical Activity: Not on file  Stress: Not on file  Social Connections: Not on file  Intimate Partner Violence: Not on file    Past Surgical History:  Procedure Laterality Date  . BUBBLE  STUDY  01/02/2021   Procedure: BUBBLE STUDY;  Surgeon: Freada Bergeron, MD;  Location: [facility];  Service: Cardiovascular;;  . TEE WITHOUT CARDIOVERSION N/A 01/02/2021   Procedure: TRANSESOPHAGEAL ECHOCARDIOGRAM (TEE);  Surgeon: Freada Bergeron, MD;  Location[facility];  Service: Cardiovascular;  Laterality: N/A;  . UMBILICAL HERNIA REPAIR N/A 03/15/2015   Procedure: UMBILICAL HERNIA REPAIR WITH MESH;  Surgeon: Coralie Keens, MD;  Location: [facility];  Service:  General;  Laterality: N/A;    No family history on file.  No Known Allergies  Current Outpatient Medications on File Prior to Visit  Medication Sig Dispense Refill  . Accu-Chek Softclix Lancets lancets Use to check Blood sugars once a day. Dx E11.8 100 each 1  . acetaminophen (TYLENOL) 325 MG tablet Take 2 tablets (650 mg total) by mouth every 4 (four) hours as needed for mild pain (or temp > 37.5 C (99.5 F)).    Marland Kitchen amLODipine (NORVASC) 10 MG tablet Take 1 tablet (10 mg total) by mouth daily. 30 tablet 0  . aspirin 81 MG chewable tablet Chew 1 tablet (81 mg total) by mouth daily.    Marland Kitchen atorvastatin (LIPITOR) 80 MG tablet Take 1 tablet (80 mg total) by mouth daily. 30 tablet 0  . Blood Glucose Monitoring Suppl (ACCU-CHEK GUIDE ME) w/Device KIT Use to check Blood sugars once a day. Dx E11.8 1 kit 0  . buPROPion (WELLBUTRIN XL) 150 MG 24 hr tablet Take 1 tablet (150 mg total) by mouth daily. 30 tablet 3  . clopidogrel (PLAVIX) 75 MG tablet Take 1 tablet (75 mg total) by mouth daily. 13 tablet 0  . glucose blood (ACCU-CHEK GUIDE) test strip Use to check Blood sugars once a day. Dx E11.8 100 each 1  . lidocaine (LIDODERM) 5 % Place 1 patch onto the skin daily. Remove & Discard patch within 12 hours or as directed by MD 30 patch 0  . metFORMIN (GLUCOPHAGE) 500 MG tablet Take 1 tablet (500 mg total) by mouth 2 (two) times daily with a meal. 60 tablet 3  . methocarbamol (ROBAXIN) 500 MG tablet Take 1 tablet (500 mg total) by mouth every 8 (eight) hours. 60 tablet 0  . pantoprazole (PROTONIX) 40 MG tablet Take 1 tablet (40 mg total) by mouth daily. 30 tablet 0  . traZODone (DESYREL) 100 MG tablet Take 1 tablet (100 mg total) by mouth at bedtime as needed for sleep. 30 tablet 0   No current facility-administered medications on file prior to visit.    BP 132/82   Pulse 97   Resp 20   Ht '5\' 9"'  (1.753 m)   Wt 238 lb 6.4 oz (108.1 kg)   SpO2 99%   BMI 35.21 kg/m       Objective:   Physical  Exam  General Mental Status- Alert. General Appearance- Not in acute distress.   Skin General: Color- Normal Color. Moisture- Normal Moisture.  Neck Carotid Arteries- Normal color. Moisture- Normal Moisture. No carotid bruits. No JVD.  Chest and Lung Exam Auscultation: Breath Sounds:-Normal.  Cardiovascular Auscultation:Rythm- Regular. Murmurs & Other Heart Sounds:Auscultation of the heart reveals- No Murmurs.  Abdomen Inspection:-Inspeection Normal. Palpation/Percussion:Note:No mass. Palpation and Percussion of the abdomen reveal- Non Tender, Non Distended + BS, no rebound or guarding.   Neurologic Cranial Nerve exam:- CN III-XII intact(No nystagmus), symmetric smile. Strength:- 5/5 equal and symmetric strength both upper and lower extremities.       Assessment & Plan:  History of hypertension.  Your blood  pressure is controlled today.  Continue amlodipine 10 mg daily.  History of hyperlipidemia.  Continue atorvastatin 80 mg daily.  History of stroke with good neurologic exam today.  Continue on aspirin and other recommendations advised by neurologist on yesterday's visit.   History of diabetes and last A1c was 6.5.  Continue low sugar diet and Metformin 500 mg twice daily.  Glad to hear that you have reduced amount of smoking.  Can continue Wellbutrin.  Stopping smoking completely will help reduce your overall cardiovascular risk.  Do recommend follow through with Zio patch monitor advised by neurologist.  Placing future CMP, lipid panel and A1c.  I recommend getting scheduled third week of April.  Follow-up in approximately 2 months or as needed.  Mackie Pai, PA-C

## 2021-03-05 ENCOUNTER — Other Ambulatory Visit: Payer: Self-pay | Admitting: Medical

## 2021-03-14 ENCOUNTER — Ambulatory Visit (INDEPENDENT_AMBULATORY_CARE_PROVIDER_SITE_OTHER): Payer: Medicaid Other

## 2021-03-14 DIAGNOSIS — I4891 Unspecified atrial fibrillation: Secondary | ICD-10-CM | POA: Diagnosis not present

## 2021-03-14 DIAGNOSIS — I639 Cerebral infarction, unspecified: Secondary | ICD-10-CM

## 2021-03-28 ENCOUNTER — Other Ambulatory Visit: Payer: Self-pay | Admitting: Medical

## 2021-03-28 MED ORDER — AMLODIPINE BESYLATE 10 MG PO TABS: 10.0000 mg | ORAL_TABLET | Freq: Every day | ORAL | 1 refills | Status: DC

## 2021-03-28 MED ORDER — ATORVASTATIN CALCIUM 80 MG PO TABS: 80.0000 mg | ORAL_TABLET | Freq: Every day | ORAL | 1 refills | Status: DC

## 2021-04-05 ENCOUNTER — Other Ambulatory Visit: Payer: Self-pay

## 2021-04-05 NOTE — Patient Outreach (Signed)
[facility]) Care Management  04/05/2021  Aaron Gibson 1966-06-08 [id number]   Telephone outreach to patient to obtain mRS was successfully completed. MRS= 1  Thank you, Vanice Sarah [facility] Care Management Assistant

## 2021-04-17 ENCOUNTER — Other Ambulatory Visit: Payer: Self-pay | Admitting: Neurology

## 2021-04-25 ENCOUNTER — Telehealth: Payer: Self-pay

## 2021-04-25 NOTE — Telephone Encounter (Signed)
Dr. Leone Payor:  In working up the chart for this pt, It was noted he was on Plavix and had a Cryptogenic stroke on 12/28/20.  Talked to the patient who states that he is no longer on the blood thinner--only ASA 81 mg dose daily.  Actual stoke event was on 12/28/20.  He is going to rehab to build up left sided strength, but he states he has full movement on that side and feels he has no limits to having the colonoscopy done.  Just wanted you view on this to confirm or not that its safe to proceed.  Thank you very much.

## 2021-04-25 NOTE — Telephone Encounter (Signed)
To proceed with direct colonoscopy in the [facility]

## 2021-04-26 ENCOUNTER — Encounter: Payer: Self-pay | Admitting: *Deleted

## 2021-04-26 NOTE — Progress Notes (Signed)
Kindly inform the patient that heart monitor study showed no evidence of any significant arrhythmias or atrial fibrillation.

## 2021-05-04 ENCOUNTER — Telehealth: Payer: Self-pay

## 2021-05-04 NOTE — Telephone Encounter (Signed)
Virtual PV scheduled for 11 am today.  Attempted to call patient at number provided in records at 10:58, 11:02, and again at 11:10.  No answer at any time.  Left message each call.  Requested patient to call [phone] today before 5 pm to get his PV rescheduled.

## 2021-05-04 NOTE — Telephone Encounter (Signed)
Patient never called to reschedule today's missed PV appointment.  No Show letter generated and mailed to the patient as well as on MyChart.  5/24 colonoscopy appointment has been cancelled.

## 2021-05-16 ENCOUNTER — Encounter: Payer: Medicaid Other | Admitting: Internal Medicine

## 2021-05-30 ENCOUNTER — Encounter: Payer: Self-pay | Admitting: Physical Medicine & Rehabilitation

## 2021-05-30 ENCOUNTER — Other Ambulatory Visit: Payer: Self-pay

## 2021-05-30 ENCOUNTER — Encounter: Payer: Medicaid Other | Attending: Physical Medicine & Rehabilitation | Admitting: Physical Medicine & Rehabilitation

## 2021-05-30 VITALS — BP 155/94 | HR 83 | Temp 98.6°F | Ht 69.0 in | Wt 229.6 lb

## 2021-05-30 DIAGNOSIS — M7502 Adhesive capsulitis of left shoulder: Secondary | ICD-10-CM | POA: Insufficient documentation

## 2021-05-30 DIAGNOSIS — I6381 Other cerebral infarction due to occlusion or stenosis of small artery: Secondary | ICD-10-CM | POA: Insufficient documentation

## 2021-05-30 DIAGNOSIS — M545 Low back pain, unspecified: Secondary | ICD-10-CM | POA: Insufficient documentation

## 2021-05-30 DIAGNOSIS — G8929 Other chronic pain: Secondary | ICD-10-CM | POA: Insufficient documentation

## 2021-05-30 NOTE — Patient Instructions (Signed)
Adhesive Capsulitis  Adhesive capsulitis, also called frozen shoulder, causes the shoulder to become stiff and painful to move. This condition happens when there is inflammation of the tendons and ligaments that surround the shoulder joint (shoulder capsule). What are the causes? This condition may be caused by:  An injury to your shoulder joint.  Straining your shoulder.  Not moving your shoulder for a period of time. This can happen if your arm was injured or in a sling.  Long-standing conditions, such as: ? Diabetes. ? Thyroid problems. ? Heart disease. ? Stroke. ? Rheumatoid arthritis. ? Lung disease. In some cases, the cause is not known. What increases the risk? You are more likely to develop this condition if you are:  A woman.  Older than 53 years of age. What are the signs or symptoms? Symptoms of this condition include:  Pain in your shoulder when you move your arm. There may also be pain when parts of your shoulder are touched. The pain may be worse at night or when you are resting.  A sore or aching shoulder.  The inability to move your shoulder normally.  Muscle spasms. How is this diagnosed? This condition is diagnosed with a physical exam and imaging tests, such as an X-ray or MRI. How is this treated? This condition may be treated with:  Treatment of the underlying cause or condition.  Medicine. Medicine may be given to relieve pain, inflammation, or muscle spasms.  Steroid injections into the shoulder joint.  Physical therapy. This involves performing exercises to get the shoulder moving again.  Acupuncture. This is a type of treatment that involves stimulating specific points on your body by inserting thin needles through your skin.  Shoulder manipulation. This is a procedure to move the shoulder into another position. It is done after you are given a medicine to make you fall asleep (general anesthetic). The joint may also be injected with salt  water at high pressure to break down scarring.  Surgery. This may be done in severe cases when other treatments have failed. Although most people recover completely from adhesive capsulitis, some may not regain full shoulder movement. Follow these instructions at home: Managing pain, stiffness, and swelling  If directed, put ice on the injured area: ? Put ice in a plastic bag. ? Place a towel between your skin and the bag. ? Leave the ice on for 20 minutes, 2-3 times per day.  If directed, apply heat to the affected area before you exercise. Use the heat source that your health care provider recommends, such as a moist heat pack or a heating pad. ? Place a towel between your skin and the heat source. ? Leave the heat on for 20-30 minutes. ? Remove the heat if your skin turns bright red. This is especially important if you are unable to feel pain, heat, or cold. You may have a greater risk of getting burned.      General instructions  Take over-the-counter and prescription medicines only as told by your health care provider.  If you are being treated with physical therapy, follow instructions from your physical therapist.  Avoid exercises that put a lot of demand on your shoulder, such as throwing. These exercises can make pain worse.  Keep all follow-up visits as told by your health care provider. This is important. Contact a health care provider if:  You develop new symptoms.  Your symptoms get worse. Summary  Adhesive capsulitis, also called frozen shoulder, causes the shoulder to  become stiff and painful to move.  You are more likely to have this condition if you are a woman and over age 40.  It is treated with physical therapy, medicines, and sometimes surgery. This information is not intended to replace advice given to you by your health care provider. Make sure you discuss any questions you have with your health care provider. Document Revised: 05/16/2018 Document  Reviewed: 05/16/2018 Elsevier Patient Education  2021 [employer]  

## 2021-05-30 NOTE — Progress Notes (Signed)
Subjective:    Patient ID: Aaron Gibson, male    DOB: Feb 08, 1966, 55 y.o.   MRN: [id number] 55 y.o. right-handed male with history of untreated hypertension chronic back pain morbid obesity with BMI 40.47 and tobacco abuse.  Lives alone independent prior to admission.  He does have family in the area.  Presented 12/28/2020 with left-sided weakness and slurred speech.  Cranial CT scan negative for acute changes.  CT angiogram of head and neck with no occlusion or significant stenosis.  Patient did receive tPA.  MRI showed acute infarct within the posterior putamen posterior body of the caudate and radiating white matter tracts on the right.  No hemorrhage or mass-effect.  EEG negative for seizure.  Echocardiogram with ejection fraction 65 to 70% no wall motion abnormalities.  TEE showed ejection fraction of 60 to 65% without thrombus small PFO.  Admission chemistries unremarkable except glucose 194 hemoglobin A1c 6.5 troponin negative.  Currently maintained on aspirin Plavix x3 weeks and aspirin alone.  Subcutaneous Lovenox for DVT prophylaxis.  Due to patient's slurred speech left-sided weakness decreased functional mobility he was admitted for a comprehensive rehab program.  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  He has pain when he raises his shoulder up as well as externally rotates his left arm. He has been trying to work out at home.  Because of insurance he has not had any outpatient therapy since discharge from the hospital.  He now has Medicaid. He has not returned to work yet. He has secondary complaints of low back pain.  He actually points to his tailbone area and he denies any recent falls although he states he played sports all his life. He has no pain radiating down his lower extremities no bowel or bladder dysfunction Pain Inventory Average Pain  4 Pain Right Now 3 My pain is sharp and aching   Pain in back and left shoulder. Left leg is still weak  In the last 24 hours, has pain interfered with the following? General activity 5 Relation with others 5 Enjoyment of life 5 What TIME of day is your pain at its worst? morning , daytime, evening and night Sleep (in general) Fair  Pain is worse with: some activites Pain improves with: injections Relief from Meds: 5  No family history on file. Social History   Socioeconomic History  . Marital status: Single    Spouse name: Not on file  . Number of children: Not on file  . Years of education: Not on file  . Highest education level: Not on file  Occupational History  . Occupation: Unemployed   Tobacco Use  . Smoking status: Former Smoker    Packs/day: 1.00    Years: 29.00    Pack years: 29.00    Types: Cigarettes  . Smokeless tobacco: Never Used  . Tobacco comment: quit 12/28/2020  Vaping Use  . Vaping Use: Never used  Substance and Sexual Activity  . Alcohol use: Yes    Comment: occasionally holidays  . Drug use: No  . Sexual activity: Yes  Other Topics Concern  . Not on file  Social History Narrative   Lives with daughter   Right Handed   Caffeine 2x week   Social Determinants of Health   Financial Resource Strain: Not on file  Food Insecurity: Not on file  Transportation Needs: Not on file  Physical Activity:  Not on file  Stress: Not on file  Social Connections: Not on file   Past Surgical History:  Procedure Laterality Date  . BUBBLE STUDY  01/02/2021   Procedure: BUBBLE STUDY;  Surgeon: Meriam Sprague, MD;  Location: [facility] ENDOSCOPY;  Service: Cardiovascular;;  . TEE WITHOUT CARDIOVERSION N/A 01/02/2021   Procedure: TRANSESOPHAGEAL ECHOCARDIOGRAM (TEE);  Surgeon: Meriam Sprague, MD;  Location: [facility] ENDOSCOPY;  Service: Cardiovascular;  Laterality: N/A;  . UMBILICAL HERNIA REPAIR N/A 03/15/2015   Procedure: UMBILICAL HERNIA REPAIR WITH MESH;   Surgeon: Abigail Miyamoto, MD;  Location: [facility] OR;  Service: General;  Laterality: N/A;   Past Surgical History:  Procedure Laterality Date  . BUBBLE STUDY  01/02/2021   Procedure: BUBBLE STUDY;  Surgeon: Meriam Sprague, MD;  Location: [facility] ENDOSCOPY;  Service: Cardiovascular;;  . TEE WITHOUT CARDIOVERSION N/A 01/02/2021   Procedure: TRANSESOPHAGEAL ECHOCARDIOGRAM (TEE);  Surgeon: Meriam Sprague, MD;  Location: [facility] ENDOSCOPY;  Service: Cardiovascular;  Laterality: N/A;  . UMBILICAL HERNIA REPAIR N/A 03/15/2015   Procedure: UMBILICAL HERNIA REPAIR WITH MESH;  Surgeon: Abigail Miyamoto, MD;  Location: [facility] OR;  Service: General;  Laterality: N/A;   Past Medical History:  Diagnosis Date  . Hypertension    untreated  . Morbid obesity (HCC)    BP (!) 155/94   Pulse 83   Temp 98.6 F (37 C)   Ht 5\' 9"  (1.753 m)   Wt 229 lb 9.6 oz (104.1 kg)   SpO2 96%   BMI 33.91 kg/m   Opioid Risk Score:   Fall Risk Score:  `1  Depression screen PHQ 2/9  Depression screen [facility] 2/9 05/30/2021 01/17/2021  Decreased Interest 1 0  Down, Depressed, Hopeless 1 0  PHQ - 2 Score 2 0  Altered sleeping - 1  Tired, decreased energy - 1  Change in appetite - 1  Feeling bad or failure about yourself  - 0  Trouble concentrating - 0  Moving slowly or fidgety/restless - 0  Suicidal thoughts - 0  PHQ-9 Score - 3    Review of Systems  Constitutional: Negative.   HENT: Negative.   Eyes: Negative.   Respiratory: Negative.   Cardiovascular: Negative.   Gastrointestinal: Negative.   Endocrine: Negative.   Genitourinary: Negative.   Musculoskeletal: Positive for back pain.       Left shoulder pain  Skin: Negative.   Allergic/Immunologic: Negative.   Neurological: Positive for weakness.       Left leg  Hematological: Negative.   Psychiatric/Behavioral: Positive for dysphoric mood.  All other systems reviewed and are negative.      Objective:   Physical Exam Vitals reviewed.  Constitutional:       Appearance: Normal appearance.  HENT:     Head: Normocephalic and atraumatic.  Eyes:     Extraocular Movements: Extraocular movements intact.     Conjunctiva/sclera: Conjunctivae normal.     Pupils: Pupils are equal, round, and reactive to light.  Musculoskeletal:     Comments: Patient has mild tenderness palpation over the coccygeal region. No tenderness over the sacrum or along the lumbar paraspinals. Left shoulder has positive impingement signs at 90 degrees the patient also has pain with external rotation of approximately 30 degrees. Negative drop arm test  Neurological:     Mental Status: He is alert and oriented to person, place, and time.     Comments: Motor strength is 5/5 in the right deltoid, bicep, tricep, grip, hip flexor, knee extensor, ankle dorsiflexor plantar  flexor Motor strength is 4/5 in the left deltoid, bicep, tricep, grip 5/5 in the left hip flexor knee extensor ankle dorsiflexor and plantar flexor Negative straight leg raising bilaterally Sensation normal bilateral upper and lower limbs  Psychiatric:        Mood and Affect: Mood normal.        Behavior: Behavior normal.           Assessment & Plan:  1.  History of right subcortical infarct with residual mild left hemiparesis.  He still has not returned to baseline in terms of strength on the left side.  Refer to outpatient PT he is independent with all self-care and mobility. 2.  Hemiplegic shoulder pain likely related to abnormal activation patterns in the shoulder on the left side.  Exam is most consistent with adhesive capsulitis as well as impingement syndrome Recommend corticosteroid injections today and referral to OT  Shoulder injection left glenohumeral   Indication: Left shoulder pain not relieved by medication management and other conservative care.  Informed consent was obtained after describing risks and benefits of the procedure with the patient, this includes bleeding, bruising, infection  and medication side effects. The patient wishes to proceed and has given written consent. Patient was placed in a seated position. The left shoulder was marked and prepped with betadine in the subacromial area. A 25-gauge 1-1/2 inch needle was inserted into the subacromial area. After negative draw back for blood, a solution containing 1 mL of 6 mg per ML betamethasone and 4 mL of 1% lidocaine was injected. A band aid was applied. The patient tolerated the procedure well. Post procedure instructions were given.  #3.  Low back pain mainly sacrococcygeal junction we will check lumbosacral spine x-ray, PT can address this as well. I will see the patient back in 4 weeks to reeval

## 2021-05-31 ENCOUNTER — Telehealth: Payer: Self-pay | Admitting: *Deleted

## 2021-05-31 NOTE — Telephone Encounter (Signed)
Per Dr Wynn Banker,  "He may use ice 30 min, every 3hours as needed. Acetaminophen 500mg  1-2 tablets 3 times a day". I have left him a detailed message on his cell phone per DPR.

## 2021-05-31 NOTE — Telephone Encounter (Signed)
Aaron Gibson was in to see you yesterday and he received a steroid injection in his shoulder and he called back today and says that it hurts worse today than yesterday.  Please advise.

## 2021-06-07 ENCOUNTER — Other Ambulatory Visit: Payer: Self-pay

## 2021-06-07 ENCOUNTER — Ambulatory Visit: Payer: Medicaid Other | Admitting: Occupational Therapy

## 2021-06-07 ENCOUNTER — Ambulatory Visit: Payer: Medicaid Other | Attending: Physical Medicine & Rehabilitation | Admitting: Physical Therapy

## 2021-06-07 ENCOUNTER — Encounter: Payer: Self-pay | Admitting: Physical Therapy

## 2021-06-07 ENCOUNTER — Encounter: Payer: Self-pay | Admitting: *Deleted

## 2021-06-07 VITALS — BP 136/95 | HR 72

## 2021-06-07 DIAGNOSIS — M6281 Muscle weakness (generalized): Secondary | ICD-10-CM | POA: Insufficient documentation

## 2021-06-07 DIAGNOSIS — R2681 Unsteadiness on feet: Secondary | ICD-10-CM | POA: Insufficient documentation

## 2021-06-07 DIAGNOSIS — I69354 Hemiplegia and hemiparesis following cerebral infarction affecting left non-dominant side: Secondary | ICD-10-CM | POA: Diagnosis not present

## 2021-06-07 DIAGNOSIS — M79602 Pain in left arm: Secondary | ICD-10-CM

## 2021-06-07 DIAGNOSIS — R278 Other lack of coordination: Secondary | ICD-10-CM | POA: Diagnosis not present

## 2021-06-07 DIAGNOSIS — M25642 Stiffness of left hand, not elsewhere classified: Secondary | ICD-10-CM | POA: Insufficient documentation

## 2021-06-07 DIAGNOSIS — R2689 Other abnormalities of gait and mobility: Secondary | ICD-10-CM | POA: Diagnosis not present

## 2021-06-07 NOTE — Therapy (Signed)
[facility] [address] [place], [place], [address] Phone: [phone]   Fax:  [phone]  Occupational Therapy Evaluation  Patient Details  Name: Aaron [name] MRN: [id number] Date of Birth: 09-Jan-1966 Referring Provider (OT): Dr Claudette LawsAndrew Kirsteins   Encounter Date: 06/07/2021   OT End of Session - 06/07/21 1143     Visit Number 1    Number of Visits 20   Need MCD Auth   Date for OT Re-Evaluation 08/16/21    Authorization Type Pt is multi D - Medicaid, OT requesting 2x/week for 10 weeks, PT requesting intial 3 visits at 1x/week x6 weeks.    Authorization - Visit Number 1   Eval 06/07/21   OT Start Time 0846    OT Stop Time 0928    OT Time Calculation (min) 42 min    Activity Tolerance Patient tolerated treatment well    Behavior During Therapy [facility] for tasks assessed/performed;Flat affect             Past Medical History:  Diagnosis Date   Hypertension    untreated   Morbid obesity (HCC)     Past Surgical History:  Procedure Laterality Date   BUBBLE STUDY  01/02/2021   Procedure: BUBBLE STUDY;  Surgeon: Meriam SpraguePemberton, Heather E, MD;  [facility];  Service: Cardiovascular;;   TEE WITHOUT CARDIOVERSION N/A 01/02/2021   Procedure: TRANSESOPHAGEAL ECHOCARDIOGRAM (TEE);  Surgeon: Meriam SpraguePemberton, Heather E, MD;  [facility];  Service: Cardiovascular;  Laterality: N/A;   UMBILICAL HERNIA REPAIR N/A 03/15/2015   Procedure: UMBILICAL HERNIA REPAIR WITH MESH;  Surgeon: Abigail Miyamotoouglas Blackman, MD;  [facility];  Service: General;  Laterality: N/A;    There were no vitals filed for this visit.   Subjective Assessment - 06/07/21 0851     Subjective  Pt is a a 55 y/o right handed male s/p CVA w/ Left hemiparesis/weakness  in 12/28/2020. He went to in-pt rehab and is referred today by Dr Wynn BankerKirsteins secondary to generalized weakness in L UE as well as shoulder pain/shoulder impingement/adhesive capsulitis.     Patient is accompanied by: Family member   Sister - Jamie   Pertinent History HTN, obesiety, L weakness following CVA in 12/28/2020    Patient Stated Goals Pt would like for decresaed shoulder pain, increased fine motor skills.    Currently in Pain? Yes    Pain Location Shoulder    Pain Orientation Left    Pain Descriptors / Indicators Sharp    Pain Type Chronic pain    Pain Onset More than a month ago    Pain Frequency Intermittent    Aggravating Factors  Certain movement and positions    Pain Relieving Factors Avoiding certain movement/positions    Multiple Pain Sites No               [facility] OT Assessment - 06/07/21 0001       Assessment   Medical Diagnosis L Hemiparesis from R CVA    Referring Provider (OT) Dr Claudette LawsAndrew Kirsteins    Onset Date/Surgical Date 12/28/20    Hand Dominance Right    Next MD Visit 07/13/2021    Prior Therapy In-pt Rehab, did not have HHOT or out-pt OT      Precautions   Precautions Other (comment)   HTN, chronic back pain, obesity, L hemiparesis with impingement and decreased coordination     Restrictions   Weight Bearing Restrictions No      Balance Screen   Has  the patient fallen in the past 6 months No    Has the patient had a decrease in activity level because of a fear of falling?  No    Is the patient reluctant to leave their home because of a fear of falling?  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   Vocation Requirements --   [employer], coordination, tool use etc.   Leisure --   Be with my kids, park     ADL   Eating/Feeding Independent   taste esnsation has diminished   Grooming Modified independent    Lower Body Bathing Independent     Upper Body Dressing Independent    Lower Body Dressing Increased time;Modified independent    Toilet Transfer Independent    Toileting - Clothing Manipulation Modified independent;Increased time    Toileting -  Database administrator Modified independent   Step in tub, with shower chair   ADL comments Overall Mod I - Independent with increassed time for taks requiring FMC, coordination. Buttons, fasteners, tying shoes etc.      IADL   Shopping Takes care of all shopping needs independently    Light Housekeeping Does personal laundry completely;Maintains house alone or with occasional assistance    Community Mobility --   Has cane for distances but uses sparingly   Medication Management Is responsible for taking medication in correct dosages at correct time    Development worker, community financial matters independently (budgets, writes checks, pays rent, bills goes to bank), collects and keeps track of income      Mobility   Mobility Status Independent   Has cane only uses occasionally     Written Expression   Dominant Hand Right      Vision - History   Baseline Vision Other (comment)    Visual History --   Wears glasses for TV/driving. Reports some diminished vision to be further assessed   Patient Visual Report Other (comment)   see above     Vision Assessment   Tracking/Visual Pursuits Able to track stimulus in all quads without difficulty    Convergence Within functional limits    Diplopia Assessment --   Denies   Comment Cont to assess in functional context of clinic setting      Cognition   Overall Cognitive Status Within Functional Limits for tasks assessed      Sensation   Light Touch Appears Intact   Appears intact however pt reports that light touch feels dull w/ gross assessment.   Semmes Weinstein Monofilament Scale --   Pt able to feel 3.61 monofilament, N w/ 2.83 as it was not available   Hot/Cold Not tested   Pt repotrs no changes when  comparing right to left   Additional Comments Pt reports paresthesias in middle, ring and small fingers, but states can sometimes be all (1-5)      Coordination   Gross Motor Movements are Fluid and Coordinated No    Fine Motor Movements are Fluid and Coordinated No    9 Hole Peg Test Left;Right    Right 9 Hole Peg Test 27.25  Left 9 Hole Peg Test 35.72    Other Pt with pain left shoulder in flexion (@90 *), ABD (~100*), ER c/o RTC pain and when reaching behind head to simulate washing hair.    Coordination impaired      ROM / Strength   AROM / PROM / Strength AROM;Strength      AROM   Overall AROM  Deficits   Left shoulder deficits, impaired in shoulder flexion, ABD, ER. C/O @ RTC     Strength   Overall Strength Other (comment)   R UE gross assessment 5/5; Left Impaired     Hand Function   Right Hand Grip (lbs) 115    Left Hand Grip (lbs) 37.9                             OT Education - 06/07/21 1142     Education Details OT evaluation, findings and recommendations: Rec 2x/week for 10 weeks.    Person(s) Educated Patient;Other (comment)   Sister   Methods Explanation;Demonstration;Verbal cues    Comprehension Verbalized understanding;Need further instruction              OT Short Term Goals - 06/07/21 1205       OT SHORT TERM GOAL #1   Title Pt will be Mod I with initial HEP L UE    Baseline Dependent    Time 5    Period Weeks    Status New    Target Date 07/12/21      OT SHORT TERM GOAL #2   Title Pt will be Mod I coordination HEP left UE    Baseline Dependent    Time 5    Period Weeks    Status New    Target Date 07/12/21      OT SHORT TERM GOAL #3   Title Pt will reports decreased pain in L UE during functional reach as evidenced by rating of 4/10 or less during simulated reach for UB Dressing.    Baseline 7/10    Time 5    Period Weeks    Status New    Target Date 07/12/21      OT SHORT TERM GOAL #4   Title Pt will I'ly state  possible warning s/s of CVA    Baseline 06/07/2021 Requires Min VC's    Time 5    Period Weeks    Status New    Target Date 07/12/21               OT Long Term Goals - 06/07/21 1208       OT LONG TERM GOAL #1   Title Pt will be Mod I with upgraded HEP for coordination, neuromuscular retraining/rehab L UE    Baseline Dependent    Time 10    Period Weeks    Status New    Target Date 08/16/21      OT LONG TERM GOAL #2   Title Pt will demonstrate improved coordination as seen by improved 9 hole peg score on left hand by 5 seconds or more    Baseline 06/07/2021: left 35.71 seconds    Time 10    Period Weeks    Status New    Target Date 08/16/21      OT LONG TERM GOAL #3   Title Pt will demonstrate painfree active ROM LE UE to 120* flexion during simulated activities/reach    Baseline 06/07/2021 L - pain  at 89*    Time 10    Period Weeks    Status New    Target Date 08/16/21      OT LONG TERM GOAL #4   Title Pt will demonstrate overall improved grip strength left UE by 15# or more per JAMAR dynamometer.    Baseline 06/07/2021 L = 37.9# vs R = 115#    Time 10    Period Weeks    Status New    Target Date 08/16/21                   Plan - 06/07/21 1146     Clinical Impression Statement Pt is a pleasant 55 y/o R HD male s/p L hemiparesis following R CVA on 12/28/2020. He had therapy in acute care followed by in-pt rehab and was d/c on 01/10/2021. PMH includes but is not limited to: HTN, obesity, back pain. Please see chart for detailed PMH. He did not have HHOT or Out-pt therapy when he was discharged from in-pt rehab and he presents today per Dr Wynn Banker for out-pt OT secondary to hemiplegic shoulder pain, secondary to abnormal activation patterns in the left shoulder. His initial assessment reveals impingement syndrome, adhesive capsulitis as well as generalized weakness, decreased strength, coordination and pain in LUE. 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    OT Occupational Profile and History Problem Focused Assessment - Including review of records relating to presenting problem    Occupational performance deficits (Please refer to evaluation for details): ADL's    Body Structure / Function / Physical Skills ADL;Obesity;Strength;Dexterity;Pain;UE functional use;ROM;Coordination;Flexibility;Sensation;FMC;Decreased knowledge of precautions;Cardiopulmonary status limiting activity;Decreased knowledge of use of DME    Rehab Potential Good    Clinical Decision Making Limited treatment options, no task modification necessary    Comorbidities Affecting Occupational Performance: May have comorbidities impacting occupational performance    Modification or Assistance to Complete Evaluation  No modification of tasks or assist necessary to complete eval    OT Frequency 2x / week    OT Duration Other (comment)   10 weeks   OT Treatment/Interventions Self-care/ADL training;Fluidtherapy;Moist Heat;DME and/or AE instruction;Therapeutic activities;Ultrasound;Therapeutic exercise;Coping strategies training;Cryotherapy;Neuromuscular education;Passive range of motion;Electrical Stimulation;Manual Therapy;Patient/family education;Other (comment)   ? Iontophoresis L RTC area if appropriate using dexamethasone sodium phosphate.   Plan Issue initial HEP for L UE hemiparesis with impingement, coordination activities.    Consulted and Agree with Plan of Care Patient;Other (Comment)   Sister            Patient will benefit from skilled therapeutic intervention in order to improve the following deficits and impairments:   Body Structure / Function / Physical Skills: ADL, Obesity, Strength, Dexterity, Pain, UE functional use, ROM, Coordination, Flexibility, Sensation, FMC, Decreased knowledge of precautions, Cardiopulmonary status limiting activity, Decreased knowledge of use of  DME       Visit Diagnosis: Hemiplegia and 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) - Plan: Ot plan of care cert/re-cert  Muscle weakness (generalized) - Plan: Ot plan of care cert/re-cert  Pain in left arm - Plan: Ot plan of care cert/re-cert  Other lack of coordination - Plan: Ot plan of care cert/re-cert  Stiffness of left hand, not elsewhere classified - Plan: Ot plan of care cert/re-cert    Problem List Patient Active Problem List   Diagnosis Date Noted   Adhesive capsulitis of left  shoulder 05/30/2021   Cerebrovascular accident (CVA) of right basal ganglia (HCC) 01/02/2021   CVA (cerebral vascular accident) (HCC) 12/29/2020   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

## 2021-06-07 NOTE — Therapy (Addendum)
[facility] [address] [place], [place], [address] Phone: [phone]   Fax:  [phone]  Physical Therapy Evaluation  Patient Details  Name: Aaron Gibson MRN: [id number] Date of Birth: Sep 01, 1966 Referring Provider (PT): Wynn Banker Victorino Sparrow, MD   Encounter Date: 06/07/2021   PT End of Session - 06/07/21 1744     Visit Number 1    Number of Visits 7    Date for PT Re-Evaluation 07/27/21    Authorization Type Medicaid    PT Start Time 0931    PT Stop Time 1015    PT Time Calculation (min) 44 min    Equipment Utilized During Treatment Gait belt    Activity Tolerance Patient tolerated treatment well    Behavior During Therapy [facility] for tasks assessed/performed             Past Medical History:  Diagnosis Date   Hypertension    untreated   Morbid obesity (HCC)     Past Surgical History:  Procedure Laterality Date   BUBBLE STUDY  01/02/2021   Procedure: BUBBLE STUDY;  Surgeon: Meriam Sprague, MD;  Location: [facility] ENDOSCOPY;  Service: Cardiovascular;;   TEE WITHOUT CARDIOVERSION N/A 01/02/2021   Procedure: TRANSESOPHAGEAL ECHOCARDIOGRAM (TEE);  Surgeon: Meriam Sprague, MD;  Location: [facility] ENDOSCOPY;  Service: Cardiovascular;  Laterality: N/A;   UMBILICAL HERNIA REPAIR N/A 03/15/2015   Procedure: UMBILICAL HERNIA REPAIR WITH MESH;  Surgeon: Abigail Miyamoto, MD;  Location: [facility] OR;  Service: General;  Laterality: N/A;    Vitals:   06/07/21 0935  BP: (!) 136/95  Pulse: 72      Subjective Assessment - 06/07/21 0933     Subjective Presented on 12/28/20 with L sided weakness/slurred speech. Found to have R CVA, received inpatient rehab and was discharged on 01/10/21. Has not been able to receive therapy since because of no insurance, now has Medicaid. Reports walking everyday - walking in [employer] and has been holding a cart. Reports strength feels more diminished and balance feels a lot better. Having a lot  of L shoulder pain and ROM is diminished. No falls. After a couple hours will notice that L side gets a bit weaker and gets a "dip" in his walk or walk more of his tip toe/drags a little bit.    Patient is accompained by: Family member   sister   Pertinent History HTN, obesity, chronic back pain.    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 No/denies                [facility] PT Assessment - 06/07/21 0943       Assessment   Medical Diagnosis L Hemiparesis from R CVA    Referring Provider (PT) Wynn Banker Victorino Sparrow, MD    Onset Date/Surgical Date 12/28/20    Hand Dominance Right    Prior Therapy inpatient rehab, no HHPT      Balance Screen   Has the patient fallen in the past 6 months No    Has the patient had a decrease in activity level because of a fear of falling?  No    Is the patient reluctant to leave their home because of a fear of falling?  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#), bending down onto the ground    Leisure being with his kids and family, go to the park      Sensation   Light Touch Impaired by gross assessment   can feel it more with RLE   Additional Comments reports entire L side feels a type of soreness after working out      Water engineer Movements  are Fluid and Coordinated No    Heel Shin Test slower to perform with LLE      ROM / Strength   AROM / PROM / Strength Strength      Strength   Strength Assessment Site Hip;Knee;Ankle    Right/Left Hip Right;Left    Right Hip Flexion 5/5    Left Hip Flexion 3/5    Right/Left Knee Right;Left    Right Knee Flexion 5/5    Right Knee Extension 5/5    Left Knee Flexion 4-/5    Left Knee Extension 4-/5    Right/Left Ankle Right;Left    Right Ankle Dorsiflexion 4+/5    Left Ankle Dorsiflexion 5/5      Transfers   Transfers Sit to Stand;Stand to Sit    Sit to Stand 5: Supervision    Five time sit to stand comments  15.68 seconds with no UE support from chair    Stand to Sit 5: Supervision      Ambulation/Gait   Ambulation/Gait Yes    Ambulation/Gait Assistance 5: Supervision    Assistive device None    Gait Pattern Step-through pattern;Decreased step length - left;Decreased hip/knee flexion - left;Decreased dorsiflexion - left;Decreased weight shift to left;Left foot flat;Poor foot clearance - left    Ambulation Surface Level;Indoor    Gait velocity 12.1 seconds = 2.71 ft/sec    Stairs Yes    Stairs Assistance 5: Supervision    Stairs Assistance Details (indicate cue type and reason) pt reports at home will sometimes just get his L toe on the step vs. having his full foot    Stair Management Technique No rails;Forwards;Alternating pattern    Number of Stairs 4    Height of Stairs 6      Functional Gait  Assessment   Gait assessed  Yes    Gait Level Surface Walks 20 ft, slow speed, abnormal gait pattern, evidence for imbalance or deviates 10-15 in outside of the 12 in walkway width. Requires more than 7 sec to ambulate 20 ft.   7.59   Change in Gait Speed Able to change speed, demonstrates mild gait deviations, deviates 6-10 in outside of the 12 in walkway width, or no gait deviations, unable to achieve a major change in velocity, or uses a change in velocity, or uses an assistive  device.    Gait with Horizontal Head Turns Performs head turns smoothly with no change in gait. Deviates no  more than 6 in outside 12 in walkway width    Gait with Vertical Head Turns Performs task with slight change in gait velocity (eg, minor disruption to smooth gait path), deviates 6 - 10 in outside 12 in walkway width or uses assistive device    Gait and Pivot Turn Pivot turns safely within 3 sec and stops quickly with no loss of balance.    Step Over Obstacle Is able to step over 2 stacked shoe boxes taped together (9 in total height) without changing gait speed. No evidence of imbalance.    Gait with Narrow Base of Support Is able to ambulate for 10 steps heel to toe with no staggering.    Gait with Eyes Closed Walks 20 ft, slow speed, abnormal gait pattern, evidence for imbalance, deviates 10-15 in outside 12 in walkway width. Requires more than 9 sec to ambulate 20 ft.   13.41 seconds   Ambulating Backwards Walks 20 ft, uses assistive device, slower speed, mild gait deviations, deviates 6-10 in outside 12 in walkway width.   15.19 seconds   Steps Alternating feet, no rail.    Total Score 23    FGA comment: 23/30 = moderate fall risk                        Objective measurements completed on examination: See above findings.               PT Education - 06/07/21 1743     Education Details clinical findings, POC, medicaid visit limit.    Person(s) Educated Patient   sister   Methods Explanation    Comprehension Verbalized understanding              PT Short Term Goals - 06/08/21 0812       PT SHORT TERM GOAL #1   Title Pt will be independent with initial HEP in order to build upon functional gains made in therapy. ALL STGS DUE AFTER 4TH VISIT.    Baseline currently dependent.    Time 4   visits   Period --    Status New    Target Date 07/06/21      PT SHORT TERM GOAL #2   Title Pt will undergo further assessment of L foot up brace to assist  with foot clearance during gait when pt is more fatigued.    Baseline not yet assessed.    Time 4   VISITS   Status New      PT SHORT TERM GOAL #3   Title Pt will decr 5x sit <> stand time to 13 seconds or less without UE support to demo improved functional BLE strength.    Baseline 15.68 seconds no UE support    Time 4   visits   Status New      PT SHORT TERM GOAL #4   Title Perform 6MWT with LTG written to demo improved walking endurance for community distances.    Baseline not yet assessed.    Time 4   visits   Status New               PT Long Term Goals - 06/08/21 0816       PT LONG TERM GOAL #1   Title Pt will be independent with final HEP in order to build upon functional gains made in therapy.    Baseline currently dependent    Time 7   visits   Status New  PT LONG TERM GOAL #2   Title goal to be written as appropriate to demo improved walking endurance.    Baseline not yet assessed.    Time 7   visits   Status New      PT LONG TERM GOAL #3   Title Pt will improve gait speed to at least 3.0 ft/sec to demo improved community mobility.    Baseline 2.71 ft/sec    Time 7   visits   Status New      PT LONG TERM GOAL #4   Title Pt will improve FGA score to at least a 26/30 in order to demo decr fall risk.    Baseline 23/30 on 06/08/21    Time 7   visits   Status New                    Plan - 06/08/21 9390     Clinical Impression Statement Patient is a 55 year old male referred to Neuro OPPT for L hemiparesis due to R MCA on 12/28/20. Pt was discharged from inpatient rehab on 01/10/21. Has not had any follow up therapy due to not having insurance (now has Medicaid)  Pt's PMH is significant for: HTN, obesity, chronic back pain. The following deficits were present during the exam: impaired strength, impaired balance, decr coordination, gait abnormalities. Based on FGA and 5x sit <>stand, pt is an incr risk for falls. Pt would benefit from skilled PT  to address these impairments and functional limitations to maximize functional mobility independence    Personal Factors and Comorbidities Comorbidity 3+;Profession;Past/Current Experience    Comorbidities HTN, obesity, chronic back pain.    Examination-Activity Limitations Locomotion Level;Squat    Examination-Participation Restrictions Occupation;Community Activity    Stability/Clinical Decision Making Stable/Uncomplicated    Clinical Decision Making Low    Rehab Potential Good    PT Frequency 1x / week   requesting initial Medicaid 1x week for 3 weeks, followed by 1x week for 3 weeks   PT Duration 3 weeks   requesting initial Medicaid 1x week for 3 weeks, followed by 1x week for 3 weeks   PT Treatment/Interventions ADLs/Self Care Home Management;Gait training;Therapeutic activities;Balance training;Stair training;Therapeutic exercise;Functional mobility training;Neuromuscular re-education;Orthotic Fit/Training;Patient/family education;Vestibular    PT Next Visit Plan perform with goal written. initial HEP for strength/balance. further assess hip abd strength    Consulted and Agree with Plan of Care Patient;Family member/caregiver    Family Member Consulted pt's sister             Patient will benefit from skilled therapeutic intervention in order to improve the following deficits and impairments:  Abnormal gait, Decreased activity tolerance, Decreased balance, Decreased coordination, Decreased endurance, Decreased strength  Visit Diagnosis: Unsteadiness on feet  Muscle weakness (generalized)  Other abnormalities of gait and mobility     Problem List Patient Active Problem List   Diagnosis Date Noted   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   Cerebrovascular accident (CVA) of right basal ganglia (HCC) 01/02/2021   CVA (cerebral vascular accident) (HCC) 12/29/2020   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

## 2021-06-08 NOTE — Addendum Note (Signed)
Addended by: Drake Leach on: 06/08/2021 08:26 AM   Modules accepted: Orders

## 2021-06-13 NOTE — Therapy (Signed)
Managed medicaid CPT codes: 75883- Therapeutic Exercise, [phone]- Neuro Re-education, [phone] - Manual Therapy, 97530 - Therapeutic Activities, [phone] - Self Care, [phone] - Iontophoresis, 97035 - Ultrasound, [id number] - Fluidotherapy, [id number] - Contrast bath, [id number] -  Paraffin, and Other Estim   Addended by, Kallie Edward, MOT, OTR/L

## 2021-06-19 ENCOUNTER — Encounter: Payer: Self-pay | Admitting: Physical Therapy

## 2021-06-19 ENCOUNTER — Other Ambulatory Visit: Payer: Self-pay

## 2021-06-19 ENCOUNTER — Ambulatory Visit: Payer: Medicaid Other | Admitting: Physical Therapy

## 2021-06-19 ENCOUNTER — Ambulatory Visit: Payer: Medicaid Other | Admitting: Occupational Therapy

## 2021-06-19 VITALS — BP 143/84 | HR 80

## 2021-06-19 DIAGNOSIS — M25642 Stiffness of left hand, not elsewhere classified: Secondary | ICD-10-CM | POA: Diagnosis not present

## 2021-06-19 DIAGNOSIS — M6281 Muscle weakness (generalized): Secondary | ICD-10-CM

## 2021-06-19 DIAGNOSIS — I69354 Hemiplegia and hemiparesis following cerebral infarction affecting left non-dominant side: Secondary | ICD-10-CM

## 2021-06-19 DIAGNOSIS — R278 Other lack of coordination: Secondary | ICD-10-CM | POA: Diagnosis not present

## 2021-06-19 DIAGNOSIS — M79602 Pain in left arm: Secondary | ICD-10-CM | POA: Diagnosis not present

## 2021-06-19 DIAGNOSIS — R2689 Other abnormalities of gait and mobility: Secondary | ICD-10-CM | POA: Diagnosis not present

## 2021-06-19 DIAGNOSIS — R2681 Unsteadiness on feet: Secondary | ICD-10-CM

## 2021-06-19 NOTE — Therapy (Signed)
[facility] [address] [place], [place], [address] Phone: [phone]   Fax:  [phone]  Physical Therapy Treatment  Patient Details  Name: Aaron Gibson MRN: [id number] Date of Birth: 1966/04/18 Referring Provider (PT): Wynn Banker Victorino Sparrow, MD   Encounter Date: 06/19/2021   PT End of Session - 06/19/21 1115     Visit Number 2    Number of Visits 7    Date for PT Re-Evaluation 07/27/21    Authorization Type Medicaid    PT Start Time 0847    PT Stop Time 0928    PT Time Calculation (min) 41 min    Equipment Utilized During Treatment Gait belt    Activity Tolerance Patient tolerated treatment well    Behavior During Therapy [facility] for tasks assessed/performed             Past Medical History:  Diagnosis Date   Hypertension    untreated   Morbid obesity (HCC)     Past Surgical History:  Procedure Laterality Date   BUBBLE STUDY  01/02/2021   Procedure: BUBBLE STUDY;  Surgeon: Meriam Sprague, MD;  Location: [facility] ENDOSCOPY;  Service: Cardiovascular;;   TEE WITHOUT CARDIOVERSION N/A 01/02/2021   Procedure: TRANSESOPHAGEAL ECHOCARDIOGRAM (TEE);  Surgeon: Meriam Sprague, MD;  Location: [facility] ENDOSCOPY;  Service: Cardiovascular;  Laterality: N/A;   UMBILICAL HERNIA REPAIR N/A 03/15/2015   Procedure: UMBILICAL HERNIA REPAIR WITH MESH;  Surgeon: Abigail Miyamoto, MD;  Location: [facility] OR;  Service: General;  Laterality: N/A;    Vitals:   06/19/21 0852  BP: (!) 143/84  Pulse: 80     Subjective Assessment - 06/19/21 0849     Subjective No falls, no changes.    Patient is accompained by: Family member   sister   Pertinent History HTN, obesity, chronic back pain.    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 Yes    Pain Score 4    at rest, when he goes to move it its an 8/10   Pain Location Shoulder    Pain  Orientation Left    Pain Descriptors / Indicators Sharp    Pain Type Chronic pain    Aggravating Factors  certain movement and positions    Pain Relieving Factors not moving it much                [facility] PT Assessment - 06/19/21 0854       Strength   Left Hip ABduction 3+/5      6 Minute Walk- Baseline   6 Minute Walk- Baseline yes    BP (mmHg) 143/84    HR (bpm) 80    02 Sat (%RA) 98 %    Modified Borg Scale for Dyspnea 0- Nothing at all      6 Minute walk- Post Test   6 Minute Walk Post Test yes    BP (mmHg) 138/87    HR (bpm) 91    02 Sat (%RA) 100 %    Modified Borg Scale for Dyspnea 0.5- Very, very slight shortness of breath    Perceived Rate of Exertion (Borg) 9- very light      6 minute walk test results    Aerobic Endurance Distance Walked 982    Endurance additional comments afterwards felt tightness in front of L hip and B calves  [facility] Adult PT Treatment/Exercise - 06/19/21 0913       Ambulation/Gait   Ambulation/Gait Yes    Ambulation/Gait Assistance 5: Supervision    Ambulation Distance (Feet) 982 Feet   during   Assistive device None    Gait Pattern Step-through pattern;Decreased step length - left;Decreased hip/knee flexion - left;Decreased dorsiflexion - left;Decreased weight shift to left;Left foot flat;Poor foot clearance - left;Decreased arm swing - left    Ambulation Surface Level;Indoor                Access Code: [email or web address] URL: [email or web address] Date: 06/19/2021 Prepared by: Sherlie Ban  Initiated HEP - see MedBridge for further details.   Exercises Gastroc Stretch on Wall - 2 x daily - 5 x weekly - 3 sets - 20-30 hold Heel Toe Raises with Counter Support - 1 x daily - 5 x weekly - 2 sets - 10 reps Standing Marching - 1 x daily - 5 x weekly - 2 sets - 10 reps Clamshell - 1 x daily - 5 x weekly - 2 sets - 10 reps    PT Education - 06/19/21 0930      Education Details results of , initial HEP    Person(s) Educated Patient    Methods Explanation;Demonstration;Handout    Comprehension Verbalized understanding;Returned demonstration              PT Short Term Goals - 06/19/21 1117       PT SHORT TERM GOAL #1   Title Pt will be independent with initial HEP in order to build upon functional gains made in therapy. ALL STGS DUE AFTER 4TH VISIT.    Baseline currently dependent.    Time 4   visits   Status New    Target Date 07/06/21      PT SHORT TERM GOAL #2   Title Pt will undergo further assessment of L foot up brace to assist with foot clearance during gait when pt is more fatigued.    Baseline not yet assessed.    Time 4   VISITS   Status New      PT SHORT TERM GOAL #3   Title Pt will decr 5x sit <> stand time to 13 seconds or less without UE support to demo improved functional BLE strength.    Baseline 15.68 seconds no UE support    Time 4   visits   Status New      PT SHORT TERM GOAL #4   Title Perform with LTG written to demo improved walking endurance for community distances.    Baseline 982' on 06/19/21 - LTG written    Time 4   visits   Status Achieved               PT Long Term Goals - 06/19/21 1118       PT LONG TERM GOAL #1   Title Pt will be independent with final HEP in order to build upon functional gains made in therapy.    Baseline currently dependent    Time 7   visits   Status New      PT LONG TERM GOAL #2   Title Pt will improve distance by at least 150' in order to demo improved walking endurance for community distances.    Baseline 928'    Time 7   visits   Status Revised      PT LONG TERM GOAL #3   Title Pt will improve gait  speed to at least 3.0 ft/sec to demo improved community mobility.    Baseline 2.71 ft/sec    Time 7   visits   Status New      PT LONG TERM GOAL #4   Title Pt will improve FGA score to at least a 26/30 in order to demo decr fall risk.     Baseline 23/30 on 06/08/21    Time 7   visits   Status New                   Plan - 06/19/21 1118     Clinical Impression Statement Performed the today with pt able to ambulate 982' with no AD. When more fatigued pt demonstrating episodes of decr foot clearance and decr ankle DF/hip flexion with LLE. Remainder of session focused on initial HEP for strength/ROM. Pt tolerated session well, will continue to progress towards LTGs.    Personal Factors and Comorbidities Comorbidity 3+;Profession;Past/Current Experience    Comorbidities HTN, obesity, chronic back pain.    Examination-Activity Limitations Locomotion Level;Squat    Examination-Participation Restrictions Occupation;Community Activity    Stability/Clinical Decision Making Stable/Uncomplicated    Rehab Potential Good    PT Frequency 1x / week   requesting initial Medicaid 1x week for 3 weeks, followed by 1x week for 3 weeks   PT Duration 3 weeks   requesting initial Medicaid 1x week for 3 weeks, followed by 1x week for 3 weeks   PT Treatment/Interventions ADLs/Self Care Home Management;Gait training;Therapeutic activities;Balance training;Stair training;Therapeutic exercise;Functional mobility training;Neuromuscular re-education;Orthotic Fit/Training;Patient/family education;Vestibular    PT Next Visit Plan perform hip flexion stretch for LLE, add balance for pt's HEP (EC, head nods, unlevel surfaces), strength for L hip ABD, flexors.    Consulted and Agree with Plan of Care Patient;Family member/caregiver    Family Member Consulted pt's sister             Patient will benefit from skilled therapeutic intervention in order to improve the following deficits and impairments:  Abnormal gait, Decreased activity tolerance, Decreased balance, Decreased coordination, Decreased endurance, Decreased strength  Visit Diagnosis: Hemiplegia and hemiparesis following cerebral infarction affecting left non-dominant side  (HCC)  Muscle weakness (generalized)  Unsteadiness on feet     Problem List Patient Active Problem List   Diagnosis Date Noted   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   Cerebrovascular accident (CVA) of right basal ganglia (HCC) 01/02/2021   CVA (cerebral vascular accident) (HCC) 12/29/2020   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

## 2021-06-19 NOTE — Patient Instructions (Signed)
1. Grip Strengthening (Resistive Putty)   Squeeze putty using thumb and all fingers. Repeat _20___ times. Do __2__ sessions per day.   2. Roll putty into tube on table and pinch between first two fingers and thumb x 10, then ring and small finger w/ thumb x 10 reps each. Do 2 sessions per day.    Coordination Activities  Perform the following activities for 10 minutes 2 times per day with left hand(s).  Rotate ball in fingertips (clockwise and counter-clockwise). Toss ball in air and catch with the same hand. Flip cards 1 at a time as fast as you can. Deal cards with your thumb (Hold deck in hand and push card off top with thumb). Rotate 1 card in hand (clockwise and counter-clockwise). Pick up coins one at a time until you get 5 in your hand, then move coins from palm to fingertips to stack one at a time. (3 stacks of 5) Practice typing.   SUPINE: Shoulder Flexion Bilateral (Cane)    Lie on back with knees bent. Hold cane with both hands. Raise both arms overhead, keep elbows straight. _10__ reps per set, _2__ sets per day  ROM: External Rotation - Wand (Supine)    Lie on back holding wand with elbows bent to 90. Rotate forearms over head as far as possible (to forehead), then rotate to belly button.  Repeat __10__ times per set.  Do __2__ sessions per day.

## 2021-06-19 NOTE — Therapy (Signed)
[facility] [address] [place], [place], [address] Phone: [phone]   Fax:  [phone]  Occupational Therapy Treatment  Patient Details  Name: Aaron Gibson MRN: [id number] Date of Birth: Aug 03, 1966 Referring Provider (OT): Dr Claudette Laws   Encounter Date: 06/19/2021   OT End of Session - 06/19/21 1030     Visit Number 2    Number of Visits 20   Need MCD Auth   Date for OT Re-Evaluation 08/16/21    Authorization Type Pt is multi D - Medicaid, OT requesting 2x/week for 10 weeks, PT requesting intial 3 visits at 1x/week x6 weeks.    Authorization - Visit Number --   Eval 06/07/21   OT Start Time 0930    OT Stop Time 1015    OT Time Calculation (min) 45 min    Activity Tolerance Patient tolerated treatment well    Behavior During Therapy [facility] for tasks assessed/performed;Flat affect             Past Medical History:  Diagnosis Date   Hypertension    untreated   Morbid obesity (HCC)     Past Surgical History:  Procedure Laterality Date   BUBBLE STUDY  01/02/2021   Procedure: BUBBLE STUDY;  Surgeon: Meriam Sprague, MD;  Location: [facility] ENDOSCOPY;  Service: Cardiovascular;;   TEE WITHOUT CARDIOVERSION N/A 01/02/2021   Procedure: TRANSESOPHAGEAL ECHOCARDIOGRAM (TEE);  Surgeon: Meriam Sprague, MD;  Location: [facility] ENDOSCOPY;  Service: Cardiovascular;  Laterality: N/A;   UMBILICAL HERNIA REPAIR N/A 03/15/2015   Procedure: UMBILICAL HERNIA REPAIR WITH MESH;  Surgeon: Abigail Miyamoto, MD;  Location: [facility] OR;  Service: General;  Laterality: N/A;    There were no vitals filed for this visit.   Subjective Assessment - 06/19/21 0937     Patient is accompanied by: Family member   Sister - Asher Muir   Pertinent History HTN, obesiety, L weakness following CVA in 12/28/2020    Patient Stated Goals Pt would like for decresaed shoulder pain, increased fine motor skills.    Currently in Pain? Yes    Pain Score  10-Worst pain ever   0/10 AT REST   Pain Location Shoulder    Pain Orientation Left    Pain Descriptors / Indicators Sharp    Pain Type Chronic pain    Pain Onset More than a month ago    Pain Frequency Intermittent    Aggravating Factors  Flex and abd  > 90*, sh ER even worse    Pain Relieving Factors rest               see pt instructions for details on HEP for coordination, hand strength, and shoulder ROM. Pt also issued Lt handed typing words                   OT Education - 06/19/21 1011     Education Details coordination HEP, putty HEP, Shoulder HEP    Person(s) Educated Patient    Methods Explanation;Demonstration;Verbal cues;Handout    Comprehension Verbalized understanding;Returned demonstration;Verbal cues required              OT Short Term Goals - 06/19/21 1034       OT SHORT TERM GOAL #1   Title Pt will be Mod I with initial HEP L UE    Baseline Dependent    Time 5    Period Weeks    Status On-going    Target Date 07/12/21  OT SHORT TERM GOAL #2   Title Pt will be Mod I coordination HEP left UE    Baseline Dependent    Time 5    Period Weeks    Status On-going    Target Date 07/12/21      OT SHORT TERM GOAL #3   Title Pt will reports decreased pain in L UE during functional reach as evidenced by rating of 4/10 or less during simulated reach for UB Dressing.    Baseline 7/10    Time 5    Period Weeks    Status New    Target Date 07/12/21      OT SHORT TERM GOAL #4   Title Pt will I'ly state possible warning s/s of CVA    Baseline 06/07/2021 Requires Min VC's    Time 5    Period Weeks    Status New    Target Date 07/12/21               OT Long Term Goals - 06/07/21 1208       OT LONG TERM GOAL #1   Title Pt will be Mod I with upgraded HEP for coordination, neuromuscular retraining/rehab L UE    Baseline Dependent    Time 10    Period Weeks    Status New    Target Date 08/16/21      OT LONG TERM GOAL #2    Title Pt will demonstrate improved coordination as seen by improved 9 hole peg score on left hand by 5 seconds or more    Baseline 06/07/2021: left 35.71 seconds    Time 10    Period Weeks    Status New    Target Date 08/16/21      OT LONG TERM GOAL #3   Title Pt will demonstrate painfree active ROM LE UE to 120* flexion during simulated activities/reach    Baseline 06/07/2021 L - pain at 90*    Time 10    Period Weeks    Status New    Target Date 08/16/21      OT LONG TERM GOAL #4   Title Pt will demonstrate overall improved grip strength left UE by 15# or more per JAMAR dynamometer.    Baseline 06/07/2021 L = 37.9# vs R = 115#    Time 10    Period Weeks    Status New    Target Date 08/16/21                   Plan - 06/19/21 1034     Clinical Impression Statement Pt's biggest complaint is pain Lt shoulder particularly with active sh abd and ER. This therapist recommended getting PCP to refer to orthopedist since pain has been present since stroke in January.    OT Occupational Profile and History Problem Focused Assessment - Including review of records relating to presenting problem    Occupational performance deficits (Please refer to evaluation for details): ADL's    Body Structure / Function / Physical Skills ADL;Obesity;Strength;Dexterity;Pain;UE functional use;ROM;Coordination;Flexibility;Sensation;FMC;Decreased knowledge of precautions;Cardiopulmonary status limiting activity;Decreased knowledge of use of DME    Rehab Potential Good    Clinical Decision Making Limited treatment options, no task modification necessary    Comorbidities Affecting Occupational Performance: May have comorbidities impacting occupational performance    Modification or Assistance to Complete Evaluation  No modification of tasks or assist necessary to complete eval    OT Frequency 2x / week    OT Duration  Other (comment)   10 weeks   OT Treatment/Interventions Self-care/ADL  training;Fluidtherapy;Moist Heat;DME and/or AE instruction;Therapeutic activities;Ultrasound;Therapeutic exercise;Coping strategies training;Cryotherapy;Neuromuscular education;Passive range of motion;Electrical Stimulation;Manual Therapy;Patient/family education;Other (comment)   ? Iontophoresis L RTC area if appropriate using dexamethasone sodium phosphate.   Plan review HEP's prn, continue to address shoulder pain and consider AA/ROM activities    Consulted and Agree with Plan of Care Patient;Other (Comment)   Sister            Patient will benefit from skilled therapeutic intervention in order to improve the following deficits and impairments:   Body Structure / Function / Physical Skills: ADL, Obesity, Strength, Dexterity, Pain, UE functional use, ROM, Coordination, Flexibility, Sensation, FMC, Decreased knowledge of precautions, Cardiopulmonary status limiting activity, Decreased knowledge of use of DME       Visit Diagnosis: Hemiplegia and 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)  Muscle weakness (generalized)  Pain in left arm  Other lack of coordination    Problem List Patient Active Problem List   Diagnosis Date Noted   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   Cerebrovascular accident (CVA) of right basal ganglia (HCC) 01/02/2021   CVA (cerebral vascular accident) (HCC) 12/29/2020   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 [place], [place], [address] Phone: [phone]   Fax:  [phone]  Name: Aaron Gibson MRN: [id number] Date of Birth: Mar 17, 1966

## 2021-06-20 ENCOUNTER — Ambulatory Visit: Payer: Medicaid Other | Admitting: Occupational Therapy

## 2021-06-20 DIAGNOSIS — I69354 Hemiplegia and hemiparesis following cerebral infarction affecting left non-dominant side: Secondary | ICD-10-CM | POA: Diagnosis not present

## 2021-06-20 DIAGNOSIS — R278 Other lack of coordination: Secondary | ICD-10-CM | POA: Diagnosis not present

## 2021-06-20 DIAGNOSIS — R2681 Unsteadiness on feet: Secondary | ICD-10-CM | POA: Diagnosis not present

## 2021-06-20 DIAGNOSIS — M79602 Pain in left arm: Secondary | ICD-10-CM | POA: Diagnosis not present

## 2021-06-20 DIAGNOSIS — M6281 Muscle weakness (generalized): Secondary | ICD-10-CM | POA: Diagnosis not present

## 2021-06-20 DIAGNOSIS — M25642 Stiffness of left hand, not elsewhere classified: Secondary | ICD-10-CM | POA: Diagnosis not present

## 2021-06-20 DIAGNOSIS — R2689 Other abnormalities of gait and mobility: Secondary | ICD-10-CM | POA: Diagnosis not present

## 2021-06-20 NOTE — Therapy (Signed)
[facility] [address] [place], [place], [address] Phone: [phone]   Fax:  [phone]  Occupational Therapy Treatment  Patient Details  Name: Aaron Gibson MRN: [id number] Date of Birth: 06-May-1966 Referring Provider (OT): Dr Claudette Laws   Encounter Date: 06/20/2021   OT End of Session - 06/20/21 0918     Visit Number 3    Number of Visits 20   Need MCD Auth   Date for OT Re-Evaluation 08/16/21    Authorization Type Pt is multi D - Medicaid, OT requesting 2x/week for 10 weeks, PT requesting intial 3 visits at 1x/week x6 weeks.    Authorization - Visit Number --   Eval 06/07/21   OT Start Time 0845    OT Stop Time 0930    OT Time Calculation (min) 45 min    Activity Tolerance Patient tolerated treatment well    Behavior During Therapy [facility] for tasks assessed/performed;Flat affect             Past Medical History:  Diagnosis Date   Hypertension    untreated   Morbid obesity (HCC)     Past Surgical History:  Procedure Laterality Date   BUBBLE STUDY  01/02/2021   Procedure: BUBBLE STUDY;  Surgeon: Meriam Sprague, MD;  Location: [facility] ENDOSCOPY;  Service: Cardiovascular;;   TEE WITHOUT CARDIOVERSION N/A 01/02/2021   Procedure: TRANSESOPHAGEAL ECHOCARDIOGRAM (TEE);  Surgeon: Meriam Sprague, MD;  Location: [facility] ENDOSCOPY;  Service: Cardiovascular;  Laterality: N/A;   UMBILICAL HERNIA REPAIR N/A 03/15/2015   Procedure: UMBILICAL HERNIA REPAIR WITH MESH;  Surgeon: Abigail Miyamoto, MD;  Location: [facility] OR;  Service: General;  Laterality: N/A;    There were no vitals filed for this visit.   Subjective Assessment - 06/20/21 0847     Subjective  I did putty ex's at home yesterday. Shoulder feels a bit better today but I put a lidocine patch on it last night    Patient is accompanied by: Family member   Sister - Jamie   Pertinent History HTN, obesiety, L weakness following CVA in 12/28/2020    Patient  Stated Goals Pt would like for decresaed shoulder pain, increased fine motor skills.    Currently in Pain? Yes    Pain Score --   fluctuates   Pain Location Shoulder    Pain Orientation Left    Pain Descriptors / Indicators Sharp    Pain Type Chronic pain    Pain Onset More than a month ago    Pain Frequency Intermittent    Aggravating Factors  abd and ER    Pain Relieving Factors REST              Reviewed shoulder flexion ex and horizontal abd w/ ER within pain free range w/ cues to prevent shoulder hiking.  Prone: scapula retraction for posterior girdle strengthening w/ weakness noted Lt side. Standing in door frame for pects stretch Lt shoulder. Seated: BUE stretch in ext and ER with scapula retraction and depression. Followed by AA/ROM in high level sh flex LUE w/ UE Ranger.   Coordination: translating stress balls, isolated finger ext ex's, rotating sharpie b/t fingers, and finger walks/pen rolling.                     OT Education - 06/20/21 0911     Education Details pects stretch, posture/scapula ex prone    Person(s) Educated Patient    Methods Explanation;Demonstration;Verbal cues;Handout  Comprehension Verbalized understanding;Returned demonstration;Verbal cues required              OT Short Term Goals - 06/20/21 0921       OT SHORT TERM GOAL #1   Title Pt will be Mod I with initial HEP L UE    Baseline Dependent    Time 5    Period Weeks    Status On-going    Target Date 07/12/21      OT SHORT TERM GOAL #2   Title Pt will be Mod I coordination HEP left UE    Baseline Dependent    Time 5    Period Weeks    Status On-going    Target Date 07/12/21      OT SHORT TERM GOAL #3   Title Pt will reports decreased pain in L UE during functional reach as evidenced by rating of 4/10 or less during simulated reach for UB Dressing.    Baseline 7/10    Time 5    Period Weeks    Status On-going    Target Date 07/12/21      OT SHORT TERM  GOAL #4   Title Pt will I'ly state possible warning s/s of CVA    Baseline 06/07/2021 Requires Min VC's    Time 5    Period Weeks    Status New    Target Date 07/12/21               OT Long Term Goals - 06/07/21 1208       OT LONG TERM GOAL #1   Title Pt will be Mod I with upgraded HEP for coordination, neuromuscular retraining/rehab L UE    Baseline Dependent    Time 10    Period Weeks    Status New    Target Date 08/16/21      OT LONG TERM GOAL #2   Title Pt will demonstrate improved coordination as seen by improved 9 hole peg score on left hand by 5 seconds or more    Baseline 06/07/2021: left 35.71 seconds    Time 10    Period Weeks    Status New    Target Date 08/16/21      OT LONG TERM GOAL #3   Title Pt will demonstrate painfree active ROM LE UE to 120* flexion during simulated activities/reach    Baseline 06/07/2021 L - pain at 90*    Time 10    Period Weeks    Status New    Target Date 08/16/21      OT LONG TERM GOAL #4   Title Pt will demonstrate overall improved grip strength left UE by 15# or more per JAMAR dynamometer.    Baseline 06/07/2021 L = 37.9# vs R = 115#    Time 10    Period Weeks    Status New    Target Date 08/16/21                   Plan - 06/20/21 2595     Clinical Impression Statement Pt tolerating stretches and ex's well for Lt shoulder.    OT Occupational Profile and History Problem Focused Assessment - Including review of records relating to presenting problem    Occupational performance deficits (Please refer to evaluation for details): ADL's    Body Structure / Function / Physical Skills ADL;Obesity;Strength;Dexterity;Pain;UE functional use;ROM;Coordination;Flexibility;Sensation;FMC;Decreased knowledge of precautions;Cardiopulmonary status limiting activity;Decreased knowledge of use of DME    Rehab Potential Good  Clinical Decision Making Limited treatment options, no task modification necessary    Comorbidities  Affecting Occupational Performance: May have comorbidities impacting occupational performance    Modification or Assistance to Complete Evaluation  No modification of tasks or assist necessary to complete eval    OT Frequency 2x / week    OT Duration Other (comment)   10 weeks   OT Treatment/Interventions Self-care/ADL training;Fluidtherapy;Moist Heat;DME and/or AE instruction;Therapeutic activities;Ultrasound;Therapeutic exercise;Coping strategies training;Cryotherapy;Neuromuscular education;Passive range of motion;Electrical Stimulation;Manual Therapy;Patient/family education;Other (comment)   ? Iontophoresis L RTC area if appropriate using dexamethasone sodium phosphate.   Plan continue to address shoulder pain and consider AA/ROM activities, gripper activity    Consulted and Agree with Plan of Care Patient;Other (Comment)   Sister            Patient will benefit from skilled therapeutic intervention in order to improve the following deficits and impairments:   Body Structure / Function / Physical Skills: ADL, Obesity, Strength, Dexterity, Pain, UE functional use, ROM, Coordination, Flexibility, Sensation, FMC, Decreased knowledge of precautions, Cardiopulmonary status limiting activity, Decreased knowledge of use of DME       Visit Diagnosis: Hemiplegia and 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)  Pain in left arm  Other lack of coordination    Problem List Patient Active Problem List   Diagnosis Date Noted   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   Cerebrovascular accident (CVA) of right basal ganglia (HCC) 01/02/2021   CVA (cerebral vascular accident) (HCC) 12/29/2020   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 [address] [place], [place], [address] Phone: [phone]   Fax:  [phone]  Name: Aaron Gibson MRN: [id number] Date of Birth: 06-10-1966

## 2021-06-20 NOTE — Patient Instructions (Signed)
Scapular Retraction (Prone)    Lie with arms at sides. Pinch shoulder blades together and towards back pockets. Repeat __10__ times per set. Do __2__ sessions per day.  Pectoral Stretch    With arms behind doorjamb, gently lean forward. Stretch is felt across chest. Hold _10___ seconds. Repeat __5__ times. Do __2__ sessions per day.

## 2021-07-04 ENCOUNTER — Telehealth: Payer: Self-pay

## 2021-07-04 ENCOUNTER — Ambulatory Visit: Payer: Medicaid Other | Attending: Physical Medicine & Rehabilitation | Admitting: Physical Therapy

## 2021-07-04 ENCOUNTER — Ambulatory Visit: Payer: Medicaid Other | Admitting: Occupational Therapy

## 2021-07-04 DIAGNOSIS — I69354 Hemiplegia and hemiparesis following cerebral infarction affecting left non-dominant side: Secondary | ICD-10-CM | POA: Insufficient documentation

## 2021-07-04 DIAGNOSIS — R2681 Unsteadiness on feet: Secondary | ICD-10-CM | POA: Insufficient documentation

## 2021-07-04 DIAGNOSIS — M79602 Pain in left arm: Secondary | ICD-10-CM | POA: Insufficient documentation

## 2021-07-04 DIAGNOSIS — R278 Other lack of coordination: Secondary | ICD-10-CM | POA: Insufficient documentation

## 2021-07-04 DIAGNOSIS — R2689 Other abnormalities of gait and mobility: Secondary | ICD-10-CM | POA: Insufficient documentation

## 2021-07-04 DIAGNOSIS — M6281 Muscle weakness (generalized): Secondary | ICD-10-CM | POA: Insufficient documentation

## 2021-07-04 NOTE — Telephone Encounter (Signed)
Called and left message for patient re: missed appointments today and reminded of next appointment this Friday 07/07/21. If pt unable to come, asked that he call back and provided clinic's number.

## 2021-07-07 ENCOUNTER — Ambulatory Visit: Payer: Medicaid Other | Admitting: Occupational Therapy

## 2021-07-07 ENCOUNTER — Other Ambulatory Visit: Payer: Self-pay

## 2021-07-07 DIAGNOSIS — M6281 Muscle weakness (generalized): Secondary | ICD-10-CM

## 2021-07-07 DIAGNOSIS — R2689 Other abnormalities of gait and mobility: Secondary | ICD-10-CM | POA: Diagnosis not present

## 2021-07-07 DIAGNOSIS — R2681 Unsteadiness on feet: Secondary | ICD-10-CM | POA: Diagnosis not present

## 2021-07-07 DIAGNOSIS — R278 Other lack of coordination: Secondary | ICD-10-CM

## 2021-07-07 DIAGNOSIS — I69354 Hemiplegia and hemiparesis following cerebral infarction affecting left non-dominant side: Secondary | ICD-10-CM

## 2021-07-07 DIAGNOSIS — M79602 Pain in left arm: Secondary | ICD-10-CM | POA: Diagnosis not present

## 2021-07-07 NOTE — Therapy (Signed)
[facility] [address] [place], [place], [address] Phone: [phone]   Fax:  [phone]  Occupational Therapy Treatment  Patient Details  Name: Aaron Gibson MRN: [id number] Date of Birth: 11-03-1966 Referring Provider (OT): Dr Claudette Laws   Encounter Date: 07/07/2021   OT End of Session - 07/07/21 0858     Visit Number 4    Number of Visits 20    Date for OT Re-Evaluation 08/16/21    Authorization Type Pt is multi D - Medicaid, OT requesting 2x/week for 10 weeks, PT requesting intial 3 visits at 1x/week x6 weeks.    Authorization - Visit Number 3    Authorization - Number of Visits 20    OT Start Time [phone]    OT Stop Time 0930    OT Time Calculation (min) 40 min    Activity Tolerance Patient tolerated treatment well    Behavior During Therapy [facility] for tasks assessed/performed;Flat affect             Past Medical History:  Diagnosis Date   Hypertension    untreated   Morbid obesity (HCC)     Past Surgical History:  Procedure Laterality Date   BUBBLE STUDY  01/02/2021   Procedure: BUBBLE STUDY;  Surgeon: Meriam Sprague, MD;  Location: [facility] ENDOSCOPY;  Service: Cardiovascular;;   TEE WITHOUT CARDIOVERSION N/A 01/02/2021   Procedure: TRANSESOPHAGEAL ECHOCARDIOGRAM (TEE);  Surgeon: Meriam Sprague, MD;  Location: [facility] ENDOSCOPY;  Service: Cardiovascular;  Laterality: N/A;   UMBILICAL HERNIA REPAIR N/A 03/15/2015   Procedure: UMBILICAL HERNIA REPAIR WITH MESH;  Surgeon: Abigail Miyamoto, MD;  Location: [facility] OR;  Service: General;  Laterality: N/A;    There were no vitals filed for this visit.   Subjective Assessment - 07/07/21 0857     Subjective  Pt reports continued shoulder pain with reaching    Pertinent History HTN, obesiety, L weakness following CVA in 12/28/2020    Patient Stated Goals Pt would like for decresaed shoulder pain, increased fine motor skills.    Currently in Pain? Yes     Pain Score 4     Pain Location Shoulder    Pain Orientation Left    Pain Descriptors / Indicators Sharp    Pain Type Chronic pain    Pain Onset More than a month ago    Pain Frequency Intermittent    Aggravating Factors  shoulder flexion greater than 90    Pain Relieving Factors rest                   Treatment:Supine unilateral shoulder flexion and circumduction with min-mod facilitation for shoulder positioning Ice pack applied to left shoulder while pt performed fine motor coordination tasks, no adverse reactions Pt placed and removed grooved pegs for pegboard while holding several pegs in hand min difficulty/ v.c               OT Education - 07/07/21 0924     Education Details supine cane exercises shoulder flexion, chest press and internal external rotation 10 reps each, min v.c facilitation for proper positioning, discussed avoiding overhead reach to minimize shoulder pain    Person(s) Educated Patient    Methods Explanation;Demonstration;Verbal cues    Comprehension Verbalized understanding;Returned demonstration;Verbal cues required              OT Short Term Goals - 07/07/21 0927       OT SHORT TERM GOAL #1   Title Pt will  be Mod I with initial HEP L UE    Baseline Dependent    Time 5    Period Weeks    Status On-going    Target Date 07/12/21      OT SHORT TERM GOAL #2   Title Pt will be Mod I coordination HEP left UE    Baseline Dependent    Time 5    Period Weeks    Status On-going    Target Date 07/12/21      OT SHORT TERM GOAL #3   Title Pt will reports decreased pain in L UE during functional reach as evidenced by rating of 4/10 or less during simulated reach for UB Dressing.    Baseline 7/10    Time 5    Period Weeks    Status On-going    Target Date 07/12/21      OT SHORT TERM GOAL #4   Title Pt will I'ly state possible warning s/s of CVA    Baseline 06/07/2021 Requires Min VC's    Time 5    Period Weeks    Status New     Target Date 07/12/21               OT Long Term Goals - 06/07/21 1208       OT LONG TERM GOAL #1   Title Pt will be Mod I with upgraded HEP for coordination, neuromuscular retraining/rehab L UE    Baseline Dependent    Time 10    Period Weeks    Status New    Target Date 08/16/21      OT LONG TERM GOAL #2   Title Pt will demonstrate improved coordination as seen by improved 9 hole peg score on left hand by 5 seconds or more    Baseline 06/07/2021: left 35.71 seconds    Time 10    Period Weeks    Status New    Target Date 08/16/21      OT LONG TERM GOAL #3   Title Pt will demonstrate painfree active ROM LE UE to 120* flexion during simulated activities/reach    Baseline 06/07/2021 L - pain at 90*    Time 10    Period Weeks    Status New    Target Date 08/16/21      OT LONG TERM GOAL #4   Title Pt will demonstrate overall improved grip strength left UE by 15# or more per JAMAR dynamometer.    Baseline 06/07/2021 L = 37.9# vs R = 115#    Time 10    Period Weeks    Status New    Target Date 08/16/21                   Plan - 07/07/21 0926     Clinical Impression Statement Pt is progressing towards goals. He requires v.c for proper postioning.    OT Occupational Profile and History Problem Focused Assessment - Including review of records relating to presenting problem    Occupational performance deficits (Please refer to evaluation for details): ADL's    Body Structure / Function / Physical Skills ADL;Obesity;Strength;Dexterity;Pain;UE functional use;ROM;Coordination;Flexibility;Sensation;FMC;Decreased knowledge of precautions;Cardiopulmonary status limiting activity;Decreased knowledge of use of DME    Rehab Potential Good    Clinical Decision Making Limited treatment options, no task modification necessary    Comorbidities Affecting Occupational Performance: May have comorbidities impacting occupational performance    Modification or Assistance to Complete  Evaluation  No modification of tasks  or assist necessary to complete eval    OT Frequency 2x / week    OT Duration Other (comment)   10 weeks   OT Treatment/Interventions Self-care/ADL training;Fluidtherapy;Moist Heat;DME and/or AE instruction;Therapeutic activities;Ultrasound;Therapeutic exercise;Coping strategies training;Cryotherapy;Neuromuscular education;Passive range of motion;Electrical Stimulation;Manual Therapy;Patient/family education;Other (comment)   ? Iontophoresis L RTC area if appropriate using dexamethasone sodium phosphate.   Plan continue to address shoulder pain, consider [place]    Consulted and Agree with Plan of Care Patient   Sister            Patient will benefit from skilled therapeutic intervention in order to improve the following deficits and impairments:   Body Structure / Function / Physical Skills: ADL, Obesity, Strength, Dexterity, Pain, UE functional use, ROM, Coordination, Flexibility, Sensation, FMC, Decreased knowledge of precautions, Cardiopulmonary status limiting activity, Decreased knowledge of use of DME       Visit Diagnosis: Hemiplegia and 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)  Pain in left arm  Other lack of coordination  Muscle weakness (generalized)    Problem List Patient Active Problem List   Diagnosis Date Noted   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   Cerebrovascular accident (CVA) of right basal ganglia (HCC) 01/02/2021   CVA (cerebral vascular accident) (HCC) 12/29/2020   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 [place], [place], [address] Phone: [phone]   Fax:  [phone]  Name: Shaurya Rawdon MRN:  [id number] Date of Birth: 15-Dec-1966

## 2021-07-10 ENCOUNTER — Other Ambulatory Visit: Payer: Self-pay

## 2021-07-10 ENCOUNTER — Ambulatory Visit: Payer: Medicaid Other | Admitting: Physical Therapy

## 2021-07-10 ENCOUNTER — Ambulatory Visit: Payer: Medicaid Other | Admitting: Occupational Therapy

## 2021-07-10 VITALS — BP 141/86 | HR 82

## 2021-07-10 DIAGNOSIS — R2689 Other abnormalities of gait and mobility: Secondary | ICD-10-CM | POA: Diagnosis not present

## 2021-07-10 DIAGNOSIS — R278 Other lack of coordination: Secondary | ICD-10-CM | POA: Diagnosis not present

## 2021-07-10 DIAGNOSIS — M6281 Muscle weakness (generalized): Secondary | ICD-10-CM

## 2021-07-10 DIAGNOSIS — M79602 Pain in left arm: Secondary | ICD-10-CM

## 2021-07-10 DIAGNOSIS — I69354 Hemiplegia and hemiparesis following cerebral infarction affecting left non-dominant side: Secondary | ICD-10-CM | POA: Diagnosis not present

## 2021-07-10 DIAGNOSIS — R2681 Unsteadiness on feet: Secondary | ICD-10-CM | POA: Diagnosis not present

## 2021-07-10 NOTE — Therapy (Signed)
[facility] [address] [place] , [place], [address] Phone: [phone]   Fax:  [phone]  Occupational Therapy Treatment  Patient Details  Name: Aaron Gibson MRN: [id number] Date of Birth: 1966-06-01 Referring Provider (OT): Dr Alysia Penna   Encounter Date: 07/10/2021   OT End of Session - 07/10/21 0955     Visit Number 5    Number of Visits 20    Date for OT Re-Evaluation 08/16/21    Authorization Type Pt is multi D - Medicaid, OT requesting 2x/week for 10 weeks, PT requesting intial 3 visits at 1x/week x6 weeks.    Authorization - Visit Number 4    Authorization - Number of Visits 20    OT Start Time 0930    OT Stop Time 1015    OT Time Calculation (min) 45 min    Activity Tolerance Patient tolerated treatment well    Behavior During Therapy WFL for tasks assessed/performed;Flat affect             Past Medical History:  Diagnosis Date   Hypertension    untreated   Morbid obesity ([place])     Past Surgical History:  Procedure Laterality Date   BUBBLE STUDY  01/02/2021   Procedure: BUBBLE STUDY;  Surgeon: Freada Bergeron, MD;  Location: [facility];  Service: Cardiovascular;;   TEE WITHOUT CARDIOVERSION N/A 01/02/2021   Procedure: TRANSESOPHAGEAL ECHOCARDIOGRAM (TEE);  Surgeon: Freada Bergeron, MD;  Location: [facility] ENDOSCOPY;  Service: Cardiovascular;  Laterality: N/A;   UMBILICAL HERNIA REPAIR N/A 03/15/2015   Procedure: UMBILICAL HERNIA REPAIR WITH MESH;  Surgeon: Coralie Keens, MD;  Location: [facility];  Service: General;  Laterality: N/A;    There were no vitals filed for this visit.   Subjective Assessment - 07/10/21 0933     Subjective  Shoulder still the same, maybe a little improvement    Pertinent History HTN, obesiety, L weakness following CVA in 12/28/2020    Patient Stated Goals Pt would like for decresaed shoulder pain, increased fine motor skills.    Currently in Pain? Yes     Pain Score 4     Pain Location Shoulder    Pain Orientation Left    Pain Descriptors / Indicators Sore   strain/pull   Pain Type Chronic pain    Pain Onset More than a month ago    Aggravating Factors  sh flexion > 90*    Pain Relieving Factors rest             Supine: bilateral shoulder flex/ex and ER/IR w/ horizontal abd using cane. Prone: bilateral scapula retraction x 10, followed by LUE sh extension (arm off mat) w/ min assist required due to weakness posterior sh girdle.   Gripper set at level 3 resistance to pick up blocks for sustained grip strength Lt hand with 3-4 rest breaks and extra time required.   UBE x 8 min. Level 3 for UE strength/endurance                         OT Short Term Goals - 07/10/21 0956       OT SHORT TERM GOAL #1   Title Pt will be Mod I with initial HEP L UE    Baseline Dependent    Time 5    Period Weeks    Status Achieved    Target Date 07/12/21      OT SHORT TERM GOAL #2  Title Pt will be Mod I coordination HEP left UE    Baseline Dependent    Time 5    Period Weeks    Status Achieved    Target Date 07/12/21      OT SHORT TERM GOAL #3   Title Pt will reports decreased pain in L UE during functional reach as evidenced by rating of 4/10 or less during simulated reach for UB Dressing.    Baseline 7/10    Time 5    Period Weeks    Status Achieved    Target Date 07/12/21      OT SHORT TERM GOAL #4   Title Pt will I'ly state possible warning s/s of CVA    Baseline 06/07/2021 Requires Min VC's    Time 5    Period Weeks    Status Achieved    Target Date 07/12/21               OT Long Term Goals - 06/07/21 1208       OT LONG TERM GOAL #1   Title Pt will be Mod I with upgraded HEP for coordination, neuromuscular retraining/rehab L UE    Baseline Dependent    Time 10    Period Weeks    Status New    Target Date 08/16/21      OT LONG TERM GOAL #2   Title Pt will demonstrate improved coordination  as seen by improved 9 hole peg score on left hand by 5 seconds or more    Baseline 06/07/2021: left 35.71 seconds    Time 10    Period Weeks    Status New    Target Date 08/16/21      OT LONG TERM GOAL #3   Title Pt will demonstrate painfree active ROM LE UE to 120* flexion during simulated activities/reach    Baseline 06/07/2021 L - pain at 90*    Time 10    Period Weeks    Status New    Target Date 08/16/21      OT LONG TERM GOAL #4   Title Pt will demonstrate overall improved grip strength left UE by 15# or more per JAMAR dynamometer.    Baseline 06/07/2021 L = 37.9# vs R = 115#    Time 10    Period Weeks    Status New    Target Date 08/16/21                   Plan - 07/10/21 8110     Clinical Impression Statement Pt has met all STG's at this time. Shoulder pain and ROM gradually improving. Posterior sh girdle weak    OT Occupational Profile and History Problem Focused Assessment - Including review of records relating to presenting problem    Occupational performance deficits (Please refer to evaluation for details): ADL's    Body Structure / Function / Physical Skills ADL;Obesity;Strength;Dexterity;Pain;UE functional use;ROM;Coordination;Flexibility;Sensation;FMC;Decreased knowledge of precautions;Cardiopulmonary status limiting activity;Decreased knowledge of use of DME    Rehab Potential Good    Clinical Decision Making Limited treatment options, no task modification necessary    Comorbidities Affecting Occupational Performance: May have comorbidities impacting occupational performance    Modification or Assistance to Complete Evaluation  No modification of tasks or assist necessary to complete eval    OT Frequency 2x / week    OT Duration Other (comment)   10 weeks   OT Treatment/Interventions Self-care/ADL training;Fluidtherapy;Moist Heat;DME and/or AE instruction;Therapeutic activities;Ultrasound;Therapeutic exercise;Coping strategies  training;Cryotherapy;Neuromuscular  education;Passive range of motion;Electrical Stimulation;Manual Therapy;Patient/family education;Other (comment)   ? Iontophoresis L RTC area if appropriate using dexamethasone sodium phosphate.   Plan continue to address shoulder pain, Lt hand coordination, consider [place] or taping    Consulted and Agree with Plan of Care Patient   Sister            Patient will benefit from skilled therapeutic intervention in order to improve the following deficits and impairments:   Body Structure / Function / Physical Skills: ADL, Obesity, Strength, Dexterity, Pain, UE functional use, ROM, Coordination, Flexibility, Sensation, FMC, Decreased knowledge of precautions, Cardiopulmonary status limiting activity, Decreased knowledge of use of DME       Visit Diagnosis: Hemiplegia and 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)  Pain in left arm  Muscle weakness (generalized)    Problem List Patient Active Problem List   Diagnosis Date Noted   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   Cerebrovascular accident (CVA) of right basal ganglia ([place]) 01/02/2021   CVA (cerebral vascular accident) ([place]) 12/29/2020   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 [place], [place], [address] Phone: [phone]   Fax:  [phone]  Name: Aaron Gibson MRN: [id number] Date of Birth: 1966-03-31

## 2021-07-10 NOTE — Therapy (Signed)
[facility] [address] [place], [place], [address] Phone: [phone]   Fax:  [phone]  Physical Therapy Treatment  Patient Details  Name: Aaron Gibson MRN: [id number] Date of Birth: Nov 26, 1966 Referring Provider (PT): Wynn Banker Victorino Sparrow, MD   Encounter Date: 07/10/2021   PT End of Session - 07/10/21 1306     Visit Number 3    Number of Visits 7    Date for PT Re-Evaluation 07/27/21    Authorization Type Medicaid    PT Start Time 0847    PT Stop Time 0929    PT Time Calculation (min) 42 min    Equipment Utilized During Treatment Gait belt    Activity Tolerance Patient tolerated treatment well    Behavior During Therapy [facility] for tasks assessed/performed             Past Medical History:  Diagnosis Date   Hypertension    untreated   Morbid obesity (HCC)     Past Surgical History:  Procedure Laterality Date   BUBBLE STUDY  01/02/2021   Procedure: BUBBLE STUDY;  Surgeon: Meriam Sprague, MD;  Location: [facility] ENDOSCOPY;  Service: Cardiovascular;;   TEE WITHOUT CARDIOVERSION N/A 01/02/2021   Procedure: TRANSESOPHAGEAL ECHOCARDIOGRAM (TEE);  Surgeon: Meriam Sprague, MD;  Location: [facility] ENDOSCOPY;  Service: Cardiovascular;  Laterality: N/A;   UMBILICAL HERNIA REPAIR N/A 03/15/2015   Procedure: UMBILICAL HERNIA REPAIR WITH MESH;  Surgeon: Abigail Miyamoto, MD;  Location: [facility] OR;  Service: General;  Laterality: N/A;    Vitals:   07/10/21 0851  BP: (!) 141/86  Pulse: 82     Subjective Assessment - 07/10/21 1305     Subjective No changes since he was last here, exercises are going slow but are going ok.    Patient is accompained by: Family member   sister   Pertinent History HTN, obesity, chronic back pain.    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 No/denies                                [facility] Adult PT Treatment/Exercise - 07/10/21 1308       Ambulation/Gait   Ambulation/Gait Yes    Ambulation/Gait Assistance 5: Supervision    Ambulation/Gait Assistance Details trialed L foot up brace in shoe today to assist with LLE foot clearance when pt is more fatigued, pt with improved heel strike throughout and pt reporting he felt like it helped to pick up his foot. pt indifferent about using one at the moment, discussed that it would not be covered by insurance and be out of pocket. will continue to determine whether or not to pursue    Assistive device None    Gait Pattern Step-through pattern;Decreased step length - left;Decreased hip/knee flexion - left;Decreased dorsiflexion - left;Decreased weight shift to left;Left foot flat;Poor foot clearance - left;Decreased arm swing - left    Ambulation Surface Level;Indoor      Exercises   Exercises Other Exercises    Other Exercises  due to incr in L anterior hip discomfort after during last session, performed modified thomas test with L hip flexor/quad stretch off mat table 3 x 45 seconds with pt reporting feeling relief. educated pt that he can perform at home off of his bed for HEP.  Reviewed clamshells for LLE 2 x 10 reps -with red tband, upgraded to HEP. also provided yellow tband just in case there is a day where pt might be more fatigued and need lighter resistance                 Balance Exercises - 07/10/21 0001       Balance Exercises: Standing   Other Standing Exercises Forward/backwards marching down and back x4 reps with yellow tband around thighs, no UE support, cues for full ROM    Other Standing Exercises Comments in corner on blue air ex: feet together eyes closed 2 x 30 seconds, eyes closed -  narrow BOS head turns 2 x 10 reps, 2 x 10 reps head nods.               PT Education - 07/10/21 1305     Education Details addition of supine hip flexor stretch to HEP  (unable to print off picture due to internet being down but pt verbalized/demonstrated understanding).    Person(s) Educated Patient    Methods Explanation;Demonstration    Comprehension Verbalized understanding;Returned demonstration              PT Short Term Goals - 06/19/21 1117       PT SHORT TERM GOAL #1   Title Pt will be independent with initial HEP in order to build upon functional gains made in therapy. ALL STGS DUE AFTER 4TH VISIT.    Baseline currently dependent.    Time 4   visits   Status New    Target Date 07/06/21      PT SHORT TERM GOAL #2   Title Pt will undergo further assessment of L foot up brace to assist with foot clearance during gait when pt is more fatigued.    Baseline not yet assessed.    Time 4   VISITS   Status New      PT SHORT TERM GOAL #3   Title Pt will decr 5x sit <> stand time to 13 seconds or less without UE support to demo improved functional BLE strength.    Baseline 15.68 seconds no UE support    Time 4   visits   Status New      PT SHORT TERM GOAL #4   Title Perform with LTG written to demo improved walking endurance for community distances.    Baseline 982' on 06/19/21 - LTG written    Time 4   visits   Status Achieved               PT Long Term Goals - 06/19/21 1118       PT LONG TERM GOAL #1   Title Pt will be independent with final HEP in order to build upon functional gains made in therapy.    Baseline currently dependent    Time 7   visits   Status New      PT LONG TERM GOAL #2   Title Pt will improve distance by at least 150' in order to demo improved walking endurance for community distances.    Baseline 928'    Time 7   visits   Status Revised      PT LONG TERM GOAL #3   Title Pt will improve gait speed to at least 3.0 ft/sec to demo improved community mobility.    Baseline 2.71 ft/sec    Time 7   visits   Status New  PT LONG TERM GOAL #4   Title Pt will improve FGA score to at least a  26/30 in order to demo decr fall risk.    Baseline 23/30 on 06/08/21    Time 7   visits   Status New                   Plan - 07/10/21 1312     Clinical Impression Statement Today's skilled session continued to focus on LLE hip strengthening (notably hip flexors and hip ABD) and standing balance strategies on compliant surfaces. Pt tolerated session well. Trialed use of L foot up brace in session today with pt demonstrating improved foot clearance with LLE (would use more so on days when walking longer distances). Will continue to trial/determine in session if pt would benefit from one.    Personal Factors and Comorbidities Comorbidity 3+;Profession;Past/Current Experience    Comorbidities HTN, obesity, chronic back pain.    Examination-Activity Limitations Locomotion Level;Squat    Examination-Participation Restrictions Occupation;Community Activity    Stability/Clinical Decision Making Stable/Uncomplicated    Rehab Potential Good    PT Frequency 1x / week   requesting initial Medicaid 1x week for 3 weeks, followed by 1x week for 3 weeks   PT Duration 3 weeks   requesting initial Medicaid 1x week for 3 weeks, followed by 1x week for 3 weeks   PT Treatment/Interventions ADLs/Self Care Home Management;Gait training;Therapeutic activities;Balance training;Stair training;Therapeutic exercise;Functional mobility training;Neuromuscular re-education;Orthotic Fit/Training;Patient/family education;Vestibular    PT Next Visit Plan STGs due. add balance for pt's HEP (EC, head nods, unlevel surfaces), strength for L hip ABD, flexors. foot up brace?    Consulted and Agree with Plan of Care Patient;Family member/caregiver    Family Member Consulted pt's sister             Patient will benefit from skilled therapeutic intervention in order to improve the following deficits and impairments:  Abnormal gait, Decreased activity tolerance, Decreased balance, Decreased coordination, Decreased  endurance, Decreased strength  Visit Diagnosis: Hemiplegia and 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)  Muscle weakness (generalized)  Unsteadiness on feet  Other abnormalities of gait and mobility     Problem List Patient Active Problem List   Diagnosis Date Noted   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   Cerebrovascular accident (CVA) of right basal ganglia (HCC) 01/02/2021   CVA (cerebral vascular accident) (HCC) 12/29/2020   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 [place], [place], [address] Phone: [phone]   Fax:  [phone]  Name: Benyamin Jeff MRN: [id number] Date of Birth: 1966/02/21

## 2021-07-12 ENCOUNTER — Other Ambulatory Visit: Payer: Self-pay

## 2021-07-12 ENCOUNTER — Ambulatory Visit: Payer: Medicaid Other | Admitting: Occupational Therapy

## 2021-07-12 DIAGNOSIS — R2681 Unsteadiness on feet: Secondary | ICD-10-CM | POA: Diagnosis not present

## 2021-07-12 DIAGNOSIS — M79602 Pain in left arm: Secondary | ICD-10-CM

## 2021-07-12 DIAGNOSIS — I69354 Hemiplegia and hemiparesis following cerebral infarction affecting left non-dominant side: Secondary | ICD-10-CM | POA: Diagnosis not present

## 2021-07-12 DIAGNOSIS — R2689 Other abnormalities of gait and mobility: Secondary | ICD-10-CM | POA: Diagnosis not present

## 2021-07-12 DIAGNOSIS — M6281 Muscle weakness (generalized): Secondary | ICD-10-CM

## 2021-07-12 DIAGNOSIS — R278 Other lack of coordination: Secondary | ICD-10-CM | POA: Diagnosis not present

## 2021-07-12 NOTE — Therapy (Signed)
[facility] [address] [place], [place], [address] Phone: [phone]   Fax:  [phone]  Occupational Therapy Treatment  Patient Details  Name: Aaron Gibson MRN: [id number] Date of Birth: 1966/08/17 Referring Provider (OT): Dr Claudette Laws   Encounter Date: 07/12/2021   OT End of Session - 07/12/21 1134     Visit Number 6    Number of Visits 20    Date for OT Re-Evaluation 08/16/21    Authorization Type Pt is multi D - Medicaid, OT requesting 2x/week for 10 weeks, PT requesting intial 3 visits at 1x/week x6 weeks.    Authorization - Visit Number 5    Authorization - Number of Visits 20    OT Start Time 0845    OT Stop Time 0930    OT Time Calculation (min) 45 min    Activity Tolerance Patient tolerated treatment well    Behavior During Therapy WFL for tasks assessed/performed;Flat affect             Past Medical History:  Diagnosis Date   Hypertension    untreated   Morbid obesity (HCC)     Past Surgical History:  Procedure Laterality Date   BUBBLE STUDY  01/02/2021   Procedure: BUBBLE STUDY;  Surgeon: Meriam Sprague, MD;  Location: [facility] ENDOSCOPY;  Service: Cardiovascular;;   TEE WITHOUT CARDIOVERSION N/A 01/02/2021   Procedure: TRANSESOPHAGEAL ECHOCARDIOGRAM (TEE);  Surgeon: Meriam Sprague, MD;  Location: [facility] ENDOSCOPY;  Service: Cardiovascular;  Laterality: N/A;   UMBILICAL HERNIA REPAIR N/A 03/15/2015   Procedure: UMBILICAL HERNIA REPAIR WITH MESH;  Surgeon: Abigail Miyamoto, MD;  Location: [facility] OR;  Service: General;  Laterality: N/A;    There were no vitals filed for this visit.   Subjective Assessment - 07/12/21 1133     Subjective  Shoulder still the same, maybe a little improvement    Pertinent History HTN, obesiety, L weakness following CVA in 12/28/2020    Patient Stated Goals Pt would like for decresaed shoulder pain, increased fine motor skills.    Currently in Pain? Yes     Pain Score 4     Pain Location Shoulder    Pain Orientation Left    Pain Descriptors / Indicators Sore    Pain Type Chronic pain    Pain Onset More than a month ago    Pain Frequency Intermittent    Aggravating Factors  sh flexion > 90*, abd and ER    Pain Relieving Factors rest              Ultrasound to anterior Lt shoulder at pain site x 8 min, 3 Mhz, continuous at 1.0wts/cm2 for pain relief. Followed by application of kinesiotape to relax biceps, upper traps, and middle deltoid. Pt educated in wear and care.   Prone: scapula retraction bilaterally with min tactile cues. Progressed to LUE sh extension w/ mod assist required and cues to prevent compensations/sh hiking Standing: bilateral sh extension with yellow theraband w/ tactile cues to prevent uppper trap activation.                       OT Short Term Goals - 07/10/21 0956       OT SHORT TERM GOAL #1   Title Pt will be Mod I with initial HEP L UE    Baseline Dependent    Time 5    Period Weeks    Status Achieved    Target Date 07/12/21  OT SHORT TERM GOAL #2   Title Pt will be Mod I coordination HEP left UE    Baseline Dependent    Time 5    Period Weeks    Status Achieved    Target Date 07/12/21      OT SHORT TERM GOAL #3   Title Pt will reports decreased pain in L UE during functional reach as evidenced by rating of 4/10 or less during simulated reach for UB Dressing.    Baseline 7/10    Time 5    Period Weeks    Status Achieved    Target Date 07/12/21      OT SHORT TERM GOAL #4   Title Pt will I'ly state possible warning s/s of CVA    Baseline 06/07/2021 Requires Min VC's    Time 5    Period Weeks    Status Achieved    Target Date 07/12/21               OT Long Term Goals - 06/07/21 1208       OT LONG TERM GOAL #1   Title Pt will be Mod I with upgraded HEP for coordination, neuromuscular retraining/rehab L UE    Baseline Dependent    Time 10    Period Weeks     Status New    Target Date 08/16/21      OT LONG TERM GOAL #2   Title Pt will demonstrate improved coordination as seen by improved 9 hole peg score on left hand by 5 seconds or more    Baseline 06/07/2021: left 35.71 seconds    Time 10    Period Weeks    Status New    Target Date 08/16/21      OT LONG TERM GOAL #3   Title Pt will demonstrate painfree active ROM LE UE to 120* flexion during simulated activities/reach    Baseline 06/07/2021 L - pain at 90*    Time 10    Period Weeks    Status New    Target Date 08/16/21      OT LONG TERM GOAL #4   Title Pt will demonstrate overall improved grip strength left UE by 15# or more per JAMAR dynamometer.    Baseline 06/07/2021 L = 37.9# vs R = 115#    Time 10    Period Weeks    Status New    Target Date 08/16/21                   Plan - 07/12/21 1135     Clinical Impression Statement Pt with decreased posterior sh girdle strength LUE. Pt also compensates into sh hiking and activating upper traps w/ sh extension    OT Occupational Profile and History Problem Focused Assessment - Including review of records relating to presenting problem    Occupational performance deficits (Please refer to evaluation for details): ADL's    Body Structure / Function / Physical Skills ADL;Obesity;Strength;Dexterity;Pain;UE functional use;ROM;Coordination;Flexibility;Sensation;FMC;Decreased knowledge of precautions;Cardiopulmonary status limiting activity;Decreased knowledge of use of DME    Rehab Potential Good    Clinical Decision Making Limited treatment options, no task modification necessary    Comorbidities Affecting Occupational Performance: May have comorbidities impacting occupational performance    Modification or Assistance to Complete Evaluation  No modification of tasks or assist necessary to complete eval    OT Frequency 2x / week    OT Duration Other (comment)   10 weeks   OT Treatment/Interventions Self-care/ADL  training;Fluidtherapy;Moist Heat;DME and/or AE instruction;Therapeutic activities;Ultrasound;Therapeutic exercise;Coping strategies training;Cryotherapy;Neuromuscular education;Passive range of motion;Electrical Stimulation;Manual Therapy;Patient/family education;Other (comment)   ? Iontophoresis L RTC area if appropriate using dexamethasone sodium phosphate.   Plan assess taping - if helps continue to tape to relax upper traps and biceps and activate scapula retraction and depression, review theraband ex for bilateral sh extension, coordination Lt hand    Consulted and Agree with Plan of Care Patient   Sister            Patient will benefit from skilled therapeutic intervention in order to improve the following deficits and impairments:   Body Structure / Function / Physical Skills: ADL, Obesity, Strength, Dexterity, Pain, UE functional use, ROM, Coordination, Flexibility, Sensation, FMC, Decreased knowledge of precautions, Cardiopulmonary status limiting activity, Decreased knowledge of use of DME       Visit Diagnosis: Hemiplegia and 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)  Pain in left arm  Muscle weakness (generalized)    Problem List Patient Active Problem List   Diagnosis Date Noted   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   Cerebrovascular accident (CVA) of right basal ganglia (HCC) 01/02/2021   CVA (cerebral vascular accident) (HCC) 12/29/2020   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 [place], [place], [address] Phone: [phone]   Fax:  [phone]  Name: Aaron Gibson MRN: [id number] Date of Birth: 1965/12/29

## 2021-07-13 ENCOUNTER — Encounter: Payer: Self-pay | Admitting: Physical Medicine & Rehabilitation

## 2021-07-13 ENCOUNTER — Encounter: Payer: Medicaid Other | Admitting: Physical Medicine & Rehabilitation

## 2021-07-13 VITALS — BP 155/90 | Ht 69.0 in | Wt 229.0 lb

## 2021-07-13 DIAGNOSIS — I6381 Other cerebral infarction due to occlusion or stenosis of small artery: Secondary | ICD-10-CM

## 2021-07-13 NOTE — Progress Notes (Signed)
Subjective:    Patient ID: Aaron Gibson, male    DOB: 1966-01-01, 55 y.o.   MRN: [id number]  HPI Attempted video visit with my chart app , pt was not on the visit, will need to reschedule Pain Inventory Average Pain 5 Pain Right Now 4 My pain is intermittent, sharp, and dull  In the last 24 hours, has pain interfered with the following? General activity 4 Relation with others 4 Enjoyment of life 4 What TIME of day is your pain at its worst? varies Sleep (in general) Fair  Pain is worse with: some activites Pain improves with: rest Relief from Meds:  na  No family history on file. Social History   Socioeconomic History   Marital status: Single    Spouse name: Not on file   Number of children: Not on file   Years of education: Not on file   Highest education level: Not on file  Occupational History   Occupation: Unemployed   Tobacco Use   Smoking status: Former    Packs/day: 1.00    Years: 29.00    Pack years: 29.00    Types: Cigarettes   Smokeless tobacco: Never   Tobacco comments:    quit 12/28/2020  Vaping Use   Vaping Use: Never used  Substance and Sexual Activity   Alcohol use: Yes    Comment: occasionally holidays   Drug use: No   Sexual activity: Yes  Other Topics Concern   Not on file  Social History Narrative   Lives with daughter   Right Handed   Caffeine 2x week   Social Determinants of Health   Financial Resource Strain: Not on file  Food Insecurity: Not on file  Transportation Needs: Not on file  Physical Activity: Not on file  Stress: Not on file  Social Connections: Not on file   Past Surgical History:  Procedure Laterality Date   BUBBLE STUDY  01/02/2021   Procedure: BUBBLE STUDY;  Surgeon: Meriam Sprague, MD;  Location: [facility] ENDOSCOPY;  Service: Cardiovascular;;   TEE WITHOUT CARDIOVERSION N/A 01/02/2021   Procedure: TRANSESOPHAGEAL ECHOCARDIOGRAM (TEE);  Surgeon: Meriam Sprague, MD;  Location: [facility] ENDOSCOPY;  Service:  Cardiovascular;  Laterality: N/A;   UMBILICAL HERNIA REPAIR N/A 03/15/2015   Procedure: UMBILICAL HERNIA REPAIR WITH MESH;  Surgeon: Abigail Miyamoto, MD;  Location: [facility] OR;  Service: General;  Laterality: N/A;   Past Surgical History:  Procedure Laterality Date   BUBBLE STUDY  01/02/2021   Procedure: BUBBLE STUDY;  Surgeon: Meriam Sprague, MD;  Location: [facility] ENDOSCOPY;  Service: Cardiovascular;;   TEE WITHOUT CARDIOVERSION N/A 01/02/2021   Procedure: TRANSESOPHAGEAL ECHOCARDIOGRAM (TEE);  Surgeon: Meriam Sprague, MD;  Location: [facility] ENDOSCOPY;  Service: Cardiovascular;  Laterality: N/A;   UMBILICAL HERNIA REPAIR N/A 03/15/2015   Procedure: UMBILICAL HERNIA REPAIR WITH MESH;  Surgeon: Abigail Miyamoto, MD;  Location: [facility] OR;  Service: General;  Laterality: N/A;   Past Medical History:  Diagnosis Date   Hypertension    untreated   Morbid obesity (HCC)    BP (!) 155/90 Comment: patient reported, virtual visit  Ht 5\' 9"  (1.753 m)   Wt 229 lb (103.9 kg)   BMI 33.82 kg/m   Opioid Risk Score:   Fall Risk Score:  `1  Depression screen PHQ 2/9  Depression screen [facility] 2/9 05/30/2021 01/17/2021  Decreased Interest 1 0  Down, Depressed, Hopeless 1 0  PHQ - 2 Score 2 0  Altered sleeping - 1  Tired, decreased  energy - 1  Change in appetite - 1  Feeling bad or failure about yourself  - 0  Trouble concentrating - 0  Moving slowly or fidgety/restless - 0  Suicidal thoughts - 0  PHQ-9 Score - 3     Review of Systems  Musculoskeletal:        Left shoulder pain  Neurological:  Positive for weakness.  All other systems reviewed and are negative.     Objective:   Physical Exam        Assessment & Plan:

## 2021-07-18 ENCOUNTER — Encounter: Payer: Self-pay | Admitting: Physical Therapy

## 2021-07-18 ENCOUNTER — Ambulatory Visit: Payer: Medicaid Other | Admitting: Physical Therapy

## 2021-07-18 ENCOUNTER — Other Ambulatory Visit: Payer: Self-pay

## 2021-07-18 ENCOUNTER — Encounter: Payer: Medicaid Other | Attending: Physical Medicine & Rehabilitation | Admitting: Physical Medicine & Rehabilitation

## 2021-07-18 ENCOUNTER — Ambulatory Visit: Payer: Medicaid Other | Admitting: Occupational Therapy

## 2021-07-18 VITALS — BP 146/86 | HR 83

## 2021-07-18 DIAGNOSIS — M79602 Pain in left arm: Secondary | ICD-10-CM | POA: Diagnosis not present

## 2021-07-18 DIAGNOSIS — M6281 Muscle weakness (generalized): Secondary | ICD-10-CM

## 2021-07-18 DIAGNOSIS — R2681 Unsteadiness on feet: Secondary | ICD-10-CM | POA: Diagnosis not present

## 2021-07-18 DIAGNOSIS — R278 Other lack of coordination: Secondary | ICD-10-CM

## 2021-07-18 DIAGNOSIS — G8929 Other chronic pain: Secondary | ICD-10-CM | POA: Insufficient documentation

## 2021-07-18 DIAGNOSIS — I69354 Hemiplegia and hemiparesis following cerebral infarction affecting left non-dominant side: Secondary | ICD-10-CM

## 2021-07-18 DIAGNOSIS — R2689 Other abnormalities of gait and mobility: Secondary | ICD-10-CM | POA: Diagnosis not present

## 2021-07-18 DIAGNOSIS — I6381 Other cerebral infarction due to occlusion or stenosis of small artery: Secondary | ICD-10-CM | POA: Insufficient documentation

## 2021-07-18 DIAGNOSIS — M545 Low back pain, unspecified: Secondary | ICD-10-CM | POA: Insufficient documentation

## 2021-07-18 DIAGNOSIS — M7502 Adhesive capsulitis of left shoulder: Secondary | ICD-10-CM | POA: Insufficient documentation

## 2021-07-18 NOTE — Therapy (Addendum)
[facility] [address] [place], [place], [address] Phone: [phone]   Fax:  [phone]  Physical Therapy Treatment  Patient Details  Name: Aaron Gibson MRN: [id number] Date of Birth: 1966-03-23 Referring Provider (PT): Letta Pate Luanna Salk, MD   Encounter Date: 07/18/2021   PT End of Session - 07/18/21 1016     Visit Number 4    Number of Visits 7    Date for PT Re-Evaluation 07/27/21    Authorization Type Medicaid    PT Start Time 0935   pt running late from OT   PT Stop Time 1015    PT Time Calculation (min) 40 min    Equipment Utilized During Treatment Gait belt    Activity Tolerance Patient tolerated treatment well    Behavior During Therapy [facility] for tasks assessed/performed             Past Medical History:  Diagnosis Date   Hypertension    untreated   Morbid obesity ([place])     Past Surgical History:  Procedure Laterality Date   BUBBLE STUDY  01/02/2021   Procedure: BUBBLE STUDY;  Surgeon: Freada Bergeron, MD;  Location: [place];  Service: Cardiovascular;;   TEE WITHOUT CARDIOVERSION N/A 01/02/2021   Procedure: TRANSESOPHAGEAL ECHOCARDIOGRAM (TEE);  Surgeon: Freada Bergeron, MD;  Location: [place];  Service: Cardiovascular;  Laterality: N/A;   UMBILICAL HERNIA REPAIR N/A 03/15/2015   Procedure: UMBILICAL HERNIA REPAIR WITH MESH;  Surgeon: Coralie Keens, MD;  Location: [facility];  Service: General;  Laterality: N/A;    Vitals:   07/18/21 0940  BP: (!) 146/86  Pulse: 83     Subjective Assessment - 07/18/21 0935     Subjective Feels like he is moving better.    Patient is accompained by: Family member   sister   Pertinent History HTN, obesity, chronic back pain.    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 No/denies   not in pain until he moves his shoulder                               [facility] Adult PT Treatment/Exercise - 07/18/21 0959       Transfers   Five time sit to stand comments  14.69 seconds no UE support from chair      Exercises   Exercises Other Exercises    Other Exercises  with green tband around ankles; side stepping down and back at countertop x3 reps, forwards/backwards monster walks down and back x2 reps , with red tband arond thighs: forwards/backwards marching down and back x3 reps                 Balance Exercises - 07/18/21 0943       Balance Exercises: Standing   SLS Eyes open;Foam/compliant surface;Limitations    SLS Limitations stepping up on rockerboard with LLE and tapping RLE to forward/cross body cone and then progressing to adding 1" foam on rockerboard x12 reps no UE support    Rockerboard Anterior/posterior;Limitations    Rockerboard Limitations eyes open: weight shifting no UE support x10 reps, x10 reps head turns, x10 reps head nods. eyes closed x30 seconds: eyes closed x5 reps head nods, x5 reps head turns    Step Ups Forward;Lateral;6 inch;Limitations    Step Ups  Limitations with LLE: x10 reps forward floating non-stance leg, 2 x 10 reps lateral step ups floating non stance leg with no UE support    Sit to Stand Foam/compliant surface;Limitations    Sit to Stand Limitations with RLE staggered posteriorly x5 reps with eyes open and x5 reps eyes closed on blue air ex               PT Education - 07/18/21 1150     Education Details making PCP aware if diastolic BP value remains elevated (pt reports at home is sometimes between 90-95)    Person(s) Educated Patient    Methods Explanation    Comprehension Verbalized understanding              PT Short Term Goals - 07/18/21 0947       PT SHORT TERM GOAL #1   Title Pt will be independent with initial HEP in order to build upon functional gains made in therapy. ALL STGS DUE AFTER 4TH VISIT.    Baseline pt reports compliance  with HEP.    Time 4   visits   Status Achieved    Target Date 07/06/21      PT SHORT TERM GOAL #2   Title Pt will undergo further assessment of L foot up brace to assist with foot clearance during gait when pt is more fatigued.    Baseline met    Time 4   VISITS   Status Achieved      PT SHORT TERM GOAL #3   Title Pt will decr 5x sit <> stand time to 13 seconds or less without UE support to demo improved functional BLE strength.    Baseline 15.68 seconds no UE support; 14.7 seconds on 07/18/21    Time 4   visits   Status Not Met      PT SHORT TERM GOAL #4   Title Perform 6MWT with LTG written to demo improved walking endurance for community distances.    Baseline 982' on 06/19/21 - LTG written    Time 4   visits   Status Achieved               PT Long Term Goals - 06/19/21 1118       PT LONG TERM GOAL #1   Title Pt will be independent with final HEP in order to build upon functional gains made in therapy.    Baseline currently dependent    Time 7   visits   Status New      PT LONG TERM GOAL #2   Title Pt will improve 6MWT distance by at least 150' in order to demo improved walking endurance for community distances.    Baseline 928'    Time 7   visits   Status Revised      PT LONG TERM GOAL #3   Title Pt will improve gait speed to at least 3.0 ft/sec to demo improved community mobility.    Baseline 2.71 ft/sec    Time 7   visits   Status New      PT LONG TERM GOAL #4   Title Pt will improve FGA score to at least a 26/30 in order to demo decr fall risk.    Baseline 23/30 on 06/08/21    Time 7   visits   Status New                   Plan - 07/18/21 1150  Clinical Impression Statement Today's skilled session focused on LLE hip strengthening, and standing balance on compliant surfaces working on SLS and with vision removed. Pt reporting incr LLE fatigue at end of session. Pt did not meet STG in regards to 5 sit <> stand - performed in 14.7 seconds with  no UE support, improved from eval, but not quite to goal level. Will continue to progress towards LTGs.    Personal Factors and Comorbidities Comorbidity 3+;Profession;Past/Current Experience    Comorbidities HTN, obesity, chronic back pain.    Examination-Activity Limitations Locomotion Level;Squat    Examination-Participation Restrictions Occupation;Community Activity    Stability/Clinical Decision Making Stable/Uncomplicated    Rehab Potential Good    PT Frequency 1x / week   requesting initial Medicaid 1x week for 3 weeks, followed by 1x week for 3 weeks   PT Duration 3 weeks   requesting initial Medicaid 1x week for 3 weeks, followed by 1x week for 3 weeks   PT Treatment/Interventions ADLs/Self Care Home Management;Gait training;Therapeutic activities;Balance training;Stair training;Therapeutic exercise;Functional mobility training;Neuromuscular re-education;Orthotic Fit/Training;Patient/family education;Vestibular    PT Next Visit Plan check BP. try warmup on elliptical. balance on unlevel surfaces, vision removed. strength for L hip ABD, flexors.    Consulted and Agree with Plan of Care Patient;Family member/caregiver    Family Member Consulted pt's sister             Patient will benefit from skilled therapeutic intervention in order to improve the following deficits and impairments:  Abnormal gait, Decreased activity tolerance, Decreased balance, Decreased coordination, Decreased endurance, Decreased strength  Visit Diagnosis: Hemiplegia and 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)  Muscle weakness (generalized)  Unsteadiness on feet     Problem List Patient Active Problem List   Diagnosis Date Noted   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   Cerebrovascular accident (CVA) of right basal ganglia ([place]) 01/02/2021   CVA (cerebral vascular accident) ([place]) 12/29/2020   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

## 2021-07-18 NOTE — Therapy (Signed)
[facility] [address] [place], [place], [address] Phone: [phone]   Fax:  [phone]  Occupational Therapy Treatment  Patient Details  Name: Aaron Gibson MRN: [id number] Date of Birth: July 20, 1966 Referring Provider (OT): Dr Alysia Penna   Encounter Date: 07/18/2021   OT End of Session - 07/18/21 0859     Visit Number 7    Number of Visits 20    Date for OT Re-Evaluation 08/16/21    Authorization Type Approved 20 O.T. visits 06/19/21 - 10/23/21    Authorization - Visit Number 6    Authorization - Number of Visits 20    OT Start Time 0850    OT Stop Time 0930    OT Time Calculation (min) 40 min    Activity Tolerance Patient tolerated treatment well    Behavior During Therapy [facility] for tasks assessed/performed;Flat affect             Past Medical History:  Diagnosis Date   Hypertension    untreated   Morbid obesity ([place])     Past Surgical History:  Procedure Laterality Date   BUBBLE STUDY  01/02/2021   Procedure: BUBBLE STUDY;  Surgeon: Freada Bergeron, MD;  Location: [place];  Service: Cardiovascular;;   TEE WITHOUT CARDIOVERSION N/A 01/02/2021   Procedure: TRANSESOPHAGEAL ECHOCARDIOGRAM (TEE);  Surgeon: Freada Bergeron, MD;  Location: [facility] ENDOSCOPY;  Service: Cardiovascular;  Laterality: N/A;   UMBILICAL HERNIA REPAIR N/A 03/15/2015   Procedure: UMBILICAL HERNIA REPAIR WITH MESH;  Surgeon: Coralie Keens, MD;  Location: [facility];  Service: General;  Laterality: N/A;    There were no vitals filed for this visit.   Subjective Assessment - 07/18/21 0850     Subjective  Shoulder still the same. No real difference with taping    Pertinent History HTN, obesiety, L weakness following CVA in 12/28/2020    Patient Stated Goals Pt would like for decresaed shoulder pain, increased fine motor skills.    Currently in Pain? Yes    Pain Score 4     Pain Location Shoulder    Pain Orientation Left     Pain Descriptors / Indicators Sore    Pain Type Chronic pain    Pain Onset More than a month ago    Pain Frequency Intermittent    Aggravating Factors  sh flexion > 95*, abd and ER             Pt reports taping didn't seem to make a difference in shoulder pain, therefore did not continue. Recommended getting referred to orthopedist due to continued sh pain. Pt to also bring in theraband to review sh ext ex next visit.   Focused on coordination this session:  Rotating ball in hand, rotating stress balls, and working medium and small sized pegs for in hand manipulation and to increase ring and small finger use.   UBE x 5 min, level 3 for UB conditioning and normal reciprocal movement pattern                       OT Short Term Goals - 07/10/21 0956       OT SHORT TERM GOAL #1   Title Pt will be Mod I with initial HEP L UE    Baseline Dependent    Time 5    Period Weeks    Status Achieved    Target Date 07/12/21      OT SHORT TERM GOAL #2  Title Pt will be Mod I coordination HEP left UE    Baseline Dependent    Time 5    Period Weeks    Status Achieved    Target Date 07/12/21      OT SHORT TERM GOAL #3   Title Pt will reports decreased pain in L UE during functional reach as evidenced by rating of 4/10 or less during simulated reach for UB Dressing.    Baseline 7/10    Time 5    Period Weeks    Status Achieved    Target Date 07/12/21      OT SHORT TERM GOAL #4   Title Pt will I'ly state possible warning s/s of CVA    Baseline 06/07/2021 Requires Min VC's    Time 5    Period Weeks    Status Achieved    Target Date 07/12/21               OT Long Term Goals - 07/18/21 0923       OT LONG TERM GOAL #1   Title Pt will be Mod I with upgraded HEP for coordination, neuromuscular retraining/rehab L UE    Baseline Dependent    Time 10    Period Weeks    Status On-going      OT LONG TERM GOAL #2   Title Pt will demonstrate improved  coordination as seen by improved 9 hole peg score on left hand by 5 seconds or more    Baseline 06/07/2021: left 35.71 seconds    Time 10    Period Weeks    Status Achieved   07/18/21: 26.65 sec     OT LONG TERM GOAL #3   Title Pt will demonstrate painfree active ROM LE UE to 120* flexion during simulated activities/reach    Baseline 06/07/2021 L - pain at 90*    Time 10    Period Weeks    Status On-going      OT LONG TERM GOAL #4   Title Pt will demonstrate overall improved grip strength left UE by 15# or more per JAMAR dynamometer.    Baseline 06/07/2021 L = 37.9# vs R = 115#    Time 10    Period Weeks    Status Achieved   07/18/21: 61.2 lbs                  Plan - 07/18/21 0924     Clinical Impression Statement Pt has met 2/4 LTG's improving in grip strength and coordination    OT Occupational Profile and History Problem Focused Assessment - Including review of records relating to presenting problem    Occupational performance deficits (Please refer to evaluation for details): ADL's    Body Structure / Function / Physical Skills ADL;Obesity;Strength;Dexterity;Pain;UE functional use;ROM;Coordination;Flexibility;Sensation;FMC;Decreased knowledge of precautions;Cardiopulmonary status limiting activity;Decreased knowledge of use of DME    Rehab Potential Good    Clinical Decision Making Limited treatment options, no task modification necessary    Comorbidities Affecting Occupational Performance: May have comorbidities impacting occupational performance    Modification or Assistance to Complete Evaluation  No modification of tasks or assist necessary to complete eval    OT Frequency 2x / week    OT Duration Other (comment)   10 weeks   OT Treatment/Interventions Self-care/ADL training;Fluidtherapy;Moist Heat;DME and/or AE instruction;Therapeutic activities;Ultrasound;Therapeutic exercise;Coping strategies training;Cryotherapy;Neuromuscular education;Passive range of  motion;Electrical Stimulation;Manual Therapy;Patient/family education;Other (comment)   ? Iontophoresis L RTC area if appropriate using dexamethasone sodium phosphate.  Plan review theraband ex in sh extension (to bring band next session), [place]ner pegs for coordination    Consulted and Agree with Plan of Care Patient   Sister            Patient will benefit from skilled therapeutic intervention in order to improve the following deficits and impairments:   Body Structure / Function / Physical Skills: ADL, Obesity, Strength, Dexterity, Pain, UE functional use, ROM, Coordination, Flexibility, Sensation, FMC, Decreased knowledge of precautions, Cardiopulmonary status limiting activity, Decreased knowledge of use of DME       Visit Diagnosis: Hemiplegia and 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)  Pain in left arm  Other lack of coordination    Problem List Patient Active Problem List   Diagnosis Date Noted   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   Cerebrovascular accident (CVA) of right basal ganglia ([place]) 01/02/2021   CVA (cerebral vascular accident) ([place]) 12/29/2020   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  12/28/2020   Chest pain, musculoskeletal 12/28/2020   Morbid obesity ([place])    Hypertension     Carey Bullocks, OTR/L 07/18/2021, 10:18 AM  [facility] [address] [place], [place], [address] Phone: [phone]   Fax:  [phone]  Name: Aaron Gibson MRN: [id number] Date of Birth: 01/09/1966

## 2021-07-20 ENCOUNTER — Ambulatory Visit: Payer: Medicaid Other | Admitting: Occupational Therapy

## 2021-07-20 ENCOUNTER — Other Ambulatory Visit: Payer: Self-pay

## 2021-07-20 DIAGNOSIS — R278 Other lack of coordination: Secondary | ICD-10-CM

## 2021-07-20 DIAGNOSIS — R2681 Unsteadiness on feet: Secondary | ICD-10-CM | POA: Diagnosis not present

## 2021-07-20 DIAGNOSIS — I69354 Hemiplegia and hemiparesis following cerebral infarction affecting left non-dominant side: Secondary | ICD-10-CM | POA: Diagnosis not present

## 2021-07-20 DIAGNOSIS — M79602 Pain in left arm: Secondary | ICD-10-CM

## 2021-07-20 DIAGNOSIS — M6281 Muscle weakness (generalized): Secondary | ICD-10-CM | POA: Diagnosis not present

## 2021-07-20 DIAGNOSIS — R2689 Other abnormalities of gait and mobility: Secondary | ICD-10-CM | POA: Diagnosis not present

## 2021-07-20 NOTE — Therapy (Signed)
[facility] [address] [place], [place], [address] Phone: [phone]   Fax:  [phone]  Occupational Therapy Treatment  Patient Details  Name: Aaron Gibson MRN: [id number] Date of Birth: 09-25-1966 Referring Provider (OT): Dr Alysia Penna   Encounter Date: 07/20/2021   OT End of Session - 07/20/21 0858     Visit Number 8    Number of Visits 20    Date for OT Re-Evaluation 08/16/21    Authorization Type Approved 20 O.T. visits 06/19/21 - 10/23/21    Authorization - Visit Number 7    Authorization - Number of Visits 20    OT Start Time [phone]    OT Stop Time 0930    OT Time Calculation (min) 38 min    Activity Tolerance Patient tolerated treatment well    Behavior During Therapy WFL for tasks assessed/performed;Flat affect             Past Medical History:  Diagnosis Date   Hypertension    untreated   Morbid obesity ([place])     Past Surgical History:  Procedure Laterality Date   BUBBLE STUDY  01/02/2021   Procedure: BUBBLE STUDY;  Surgeon: Freada Bergeron, MD;  Location: [facility];  Service: Cardiovascular;;   TEE WITHOUT CARDIOVERSION N/A 01/02/2021   Procedure: TRANSESOPHAGEAL ECHOCARDIOGRAM (TEE);  Surgeon: Freada Bergeron, MD;  Location: [facility] ENDOSCOPY;  Service: Cardiovascular;  Laterality: N/A;   UMBILICAL HERNIA REPAIR N/A 03/15/2015   Procedure: UMBILICAL HERNIA REPAIR WITH MESH;  Surgeon: Coralie Keens, MD;  Location: [facility];  Service: General;  Laterality: N/A;    There were no vitals filed for this visit.   Subjective Assessment - 07/20/21 0856     Subjective  Shoulder still the same. No real difference with taping    Pertinent History HTN, obesiety, L weakness following CVA in 12/28/2020    Patient Stated Goals Pt would like for decresaed shoulder pain, increased fine motor skills.    Currently in Pain? Yes    Pain Score 4     Pain Location Shoulder    Pain Orientation Left     Pain Descriptors / Indicators Sore    Pain Type Chronic pain    Pain Onset More than a month ago    Pain Frequency Intermittent    Aggravating Factors  ONLY with movement above midrange    Pain Relieving Factors rest, lower level reaching              Pt placing O'Conner pegs in pegboard Lt hand then removing using tweezers w/ min difficulty. Pt placing grooved pegs in pegboard Lt hand w/ min difficulty.   Supine: bilateral sh flexion w/ cane overhead, followed by holding 2 lb weight (palms facing) for same ex.  Seated: LUE sh flexion with visual cue to prevent compensations. Pt with improved ROM. BUE sh extension stretch wt bearing through hands with trunk extension. Prone: scapula retraction bilaterally  UBE x 5 min, level 1 (going backwards)                      OT Short Term Goals - 07/10/21 0956       OT SHORT TERM GOAL #1   Title Pt will be Mod I with initial HEP L UE    Baseline Dependent    Time 5    Period Weeks    Status Achieved    Target Date 07/12/21      OT  SHORT TERM GOAL #2   Title Pt will be Mod I coordination HEP left UE    Baseline Dependent    Time 5    Period Weeks    Status Achieved    Target Date 07/12/21      OT SHORT TERM GOAL #3   Title Pt will reports decreased pain in L UE during functional reach as evidenced by rating of 4/10 or less during simulated reach for UB Dressing.    Baseline 7/10    Time 5    Period Weeks    Status Achieved    Target Date 07/12/21      OT SHORT TERM GOAL #4   Title Pt will I'ly state possible warning s/s of CVA    Baseline 06/07/2021 Requires Min VC's    Time 5    Period Weeks    Status Achieved    Target Date 07/12/21               OT Long Term Goals - 07/18/21 0923       OT LONG TERM GOAL #1   Title Pt will be Mod I with upgraded HEP for coordination, neuromuscular retraining/rehab L UE    Baseline Dependent    Time 10    Period Weeks    Status On-going      OT LONG  TERM GOAL #2   Title Pt will demonstrate improved coordination as seen by improved 9 hole peg score on left hand by 5 seconds or more    Baseline 06/07/2021: left 35.71 seconds    Time 10    Period Weeks    Status Achieved   07/18/21: 26.65 sec     OT LONG TERM GOAL #3   Title Pt will demonstrate painfree active ROM LE UE to 120* flexion during simulated activities/reach    Baseline 06/07/2021 L - pain at 90*    Time 10    Period Weeks    Status On-going      OT LONG TERM GOAL #4   Title Pt will demonstrate overall improved grip strength left UE by 15# or more per JAMAR dynamometer.    Baseline 06/07/2021 L = 37.9# vs R = 115#    Time 10    Period Weeks    Status Achieved   07/18/21: 61.2 lbs                  Plan - 07/20/21 0903     Clinical Impression Statement Pt has met 2/4 LTG's improving in grip strength and coordination. Shoulder pain remains, overall decreased from 7/10 to 4/10. Pt also demo increased sh flexion against gravity today to approx 115*    OT Occupational Profile and History Problem Focused Assessment - Including review of records relating to presenting problem    Occupational performance deficits (Please refer to evaluation for details): ADL's    Body Structure / Function / Physical Skills ADL;Obesity;Strength;Dexterity;Pain;UE functional use;ROM;Coordination;Flexibility;Sensation;FMC;Decreased knowledge of precautions;Cardiopulmonary status limiting activity;Decreased knowledge of use of DME    Rehab Potential Good    Clinical Decision Making Limited treatment options, no task modification necessary    Comorbidities Affecting Occupational Performance: May have comorbidities impacting occupational performance    Modification or Assistance to Complete Evaluation  No modification of tasks or assist necessary to complete eval    OT Frequency 2x / week    OT Duration Other (comment)   10 weeks   OT Treatment/Interventions Self-care/ADL  training;Fluidtherapy;Moist Heat;DME and/or AE instruction;Therapeutic  activities;Ultrasound;Therapeutic exercise;Coping strategies training;Cryotherapy;Neuromuscular education;Passive range of motion;Electrical Stimulation;Manual Therapy;Patient/family education;Other (comment)   ? Iontophoresis L RTC area if appropriate using dexamethasone sodium phosphate.   Plan review theraband ex in sh extension avoiding sh hiking (to bring band next session), in hand manipulation (stress balls, rotating ball in fingertips, putting coins in hand = fingertip to/from palm translation), UBE    Consulted and Agree with Plan of Care Patient   Sister            Patient will benefit from skilled therapeutic intervention in order to improve the following deficits and impairments:   Body Structure / Function / Physical Skills: ADL, Obesity, Strength, Dexterity, Pain, UE functional use, ROM, Coordination, Flexibility, Sensation, FMC, Decreased knowledge of precautions, Cardiopulmonary status limiting activity, Decreased knowledge of use of DME       Visit Diagnosis: Hemiplegia and 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)  Pain in left arm  Other lack of coordination  Muscle weakness (generalized)    Problem List Patient Active Problem List   Diagnosis Date Noted   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   Cerebrovascular accident (CVA) of right basal ganglia ([place]) 01/02/2021   CVA (cerebral vascular accident) ([place]) 12/29/2020   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

## 2021-07-25 ENCOUNTER — Ambulatory Visit: Payer: Medicaid Other | Attending: Physical Medicine & Rehabilitation | Admitting: Physical Therapy

## 2021-07-25 ENCOUNTER — Other Ambulatory Visit: Payer: Self-pay

## 2021-07-25 ENCOUNTER — Encounter: Payer: Self-pay | Admitting: Physical Therapy

## 2021-07-25 ENCOUNTER — Encounter: Payer: Self-pay | Admitting: Occupational Therapy

## 2021-07-25 ENCOUNTER — Ambulatory Visit: Payer: Medicaid Other | Admitting: Occupational Therapy

## 2021-07-25 VITALS — BP 139/85 | HR 82

## 2021-07-25 DIAGNOSIS — R278 Other lack of coordination: Secondary | ICD-10-CM | POA: Insufficient documentation

## 2021-07-25 DIAGNOSIS — R2681 Unsteadiness on feet: Secondary | ICD-10-CM | POA: Diagnosis not present

## 2021-07-25 DIAGNOSIS — R2689 Other abnormalities of gait and mobility: Secondary | ICD-10-CM

## 2021-07-25 DIAGNOSIS — M79602 Pain in left arm: Secondary | ICD-10-CM | POA: Insufficient documentation

## 2021-07-25 DIAGNOSIS — M25642 Stiffness of left hand, not elsewhere classified: Secondary | ICD-10-CM | POA: Insufficient documentation

## 2021-07-25 DIAGNOSIS — I69354 Hemiplegia and hemiparesis following cerebral infarction affecting left non-dominant side: Secondary | ICD-10-CM | POA: Diagnosis not present

## 2021-07-25 DIAGNOSIS — M6281 Muscle weakness (generalized): Secondary | ICD-10-CM | POA: Diagnosis not present

## 2021-07-25 NOTE — Therapy (Signed)
[facility] [address] [place], [place], [address] Phone: [phone]   Fax:  [phone]  Occupational Therapy Treatment  Patient Details  Name: Aaron Gibson MRN: [id number] Date of Birth: Jan 28, 1966 Referring Provider (OT): Dr Claudette Laws   Encounter Date: 07/25/2021   OT End of Session - 07/25/21 0925     Visit Number 9    Number of Visits 20    Date for OT Re-Evaluation 08/16/21    Authorization Type Approved 20 O.T. visits 06/19/21 - 10/23/21    Authorization - Visit Number 8    Authorization - Number of Visits 20    OT Start Time 0848    OT Stop Time 0928    OT Time Calculation (min) 40 min    Activity Tolerance Patient tolerated treatment well    Behavior During Therapy [facility] for tasks assessed/performed;Flat affect             Past Medical History:  Diagnosis Date   Hypertension    untreated   Morbid obesity (HCC)     Past Surgical History:  Procedure Laterality Date   BUBBLE STUDY  01/02/2021   Procedure: BUBBLE STUDY;  Surgeon: Meriam Sprague, MD;  Location: [facility] ENDOSCOPY;  Service: Cardiovascular;;   TEE WITHOUT CARDIOVERSION N/A 01/02/2021   Procedure: TRANSESOPHAGEAL ECHOCARDIOGRAM (TEE);  Surgeon: Meriam Sprague, MD;  Location[facility] ENDOSCOPY;  Service: Cardiovascular;  Laterality: N/A;   UMBILICAL HERNIA REPAIR N/A 03/15/2015   Procedure: UMBILICAL HERNIA REPAIR WITH MESH;  Surgeon: Abigail Miyamoto, MD;  Location: [facility] OR;  Service: General;  Laterality: N/A;    There were no vitals filed for this visit.   Subjective Assessment - 07/25/21 0853     Subjective  Pt eports shoulder pain only with malpositioning    Pertinent History HTN, obesiety, L weakness following CVA in 12/28/2020    Patient Stated Goals Pt would like for decresaed shoulder pain, increased fine motor skills.    Currently in Pain? Yes    Pain Score 3     Pain Location Shoulder    Pain Orientation Left     Pain Descriptors / Indicators Aching    Pain Type Chronic pain    Pain Onset More than a month ago    Pain Frequency Intermittent    Aggravating Factors  only with high level reaching    Pain Relieving Factors rest lower level reach                   Treatment: Fine motor coordination activity placing grooved pegs into pegboard with finger tips then removing with tweezers for sustained pinch, min difficulty/ v.c  Arm bike x 8 mins level 3 for conditioning               OT Education - 07/25/21 0915     Education Details Yellow theraband HEP, hanout issued, 10-15 reps each, min v.c    Person(s) Educated Patient    Methods Explanation;Demonstration;Verbal cues;Handout    Comprehension Verbalized understanding;Returned demonstration;Verbal cues required              OT Short Term Goals - 07/10/21 0956       OT SHORT TERM GOAL #1   Title Pt will be Mod I with initial HEP L UE    Baseline Dependent    Time 5    Period Weeks    Status Achieved    Target Date 07/12/21      OT SHORT  TERM GOAL #2   Title Pt will be Mod I coordination HEP left UE    Baseline Dependent    Time 5    Period Weeks    Status Achieved    Target Date 07/12/21      OT SHORT TERM GOAL #3   Title Pt will reports decreased pain in L UE during functional reach as evidenced by rating of 4/10 or less during simulated reach for UB Dressing.    Baseline 7/10    Time 5    Period Weeks    Status Achieved    Target Date 07/12/21      OT SHORT TERM GOAL #4   Title Pt will I'ly state possible warning s/s of CVA    Baseline 06/07/2021 Requires Min VC's    Time 5    Period Weeks    Status Achieved    Target Date 07/12/21               OT Long Term Goals - 07/18/21 0923       OT LONG TERM GOAL #1   Title Pt will be Mod I with upgraded HEP for coordination, neuromuscular retraining/rehab L UE    Baseline Dependent    Time 10    Period Weeks    Status On-going      OT LONG  TERM GOAL #2   Title Pt will demonstrate improved coordination as seen by improved 9 hole peg score on left hand by 5 seconds or more    Baseline 06/07/2021: left 35.71 seconds    Time 10    Period Weeks    Status Achieved   07/18/21: 26.65 sec     OT LONG TERM GOAL #3   Title Pt will demonstrate painfree active ROM LE UE to 120* flexion during simulated activities/reach    Baseline 06/07/2021 L - pain at 90*    Time 10    Period Weeks    Status On-going      OT LONG TERM GOAL #4   Title Pt will demonstrate overall improved grip strength left UE by 15# or more per JAMAR dynamometer.    Baseline 06/07/2021 L = 37.9# vs R = 115#    Time 10    Period Weeks    Status Achieved   07/18/21: 61.2 lbs                  Plan - 07/25/21 0916     Clinical Impression Statement Pt is progressing towards goals with improving shoulder pain and grip strength.    OT Occupational Profile and History Problem Focused Assessment - Including review of records relating to presenting problem    Occupational performance deficits (Please refer to evaluation for details): ADL's    Body Structure / Function / Physical Skills ADL;Obesity;Strength;Dexterity;Pain;UE functional use;ROM;Coordination;Flexibility;Sensation;FMC;Decreased knowledge of precautions;Cardiopulmonary status limiting activity;Decreased knowledge of use of DME    Rehab Potential Good    Clinical Decision Making Limited treatment options, no task modification necessary    Comorbidities Affecting Occupational Performance: May have comorbidities impacting occupational performance    Modification or Assistance to Complete Evaluation  No modification of tasks or assist necessary to complete eval    OT Frequency 2x / week    OT Duration Other (comment)   10 weeks   OT Treatment/Interventions Self-care/ADL training;Fluidtherapy;Moist Heat;DME and/or AE instruction;Therapeutic activities;Ultrasound;Therapeutic exercise;Coping strategies  training;Cryotherapy;Neuromuscular education;Passive range of motion;Electrical Stimulation;Manual Therapy;Patient/family education;Other (comment)   ? Iontophoresis L RTC area if appropriate  using dexamethasone sodium phosphate.   Plan in hand manipulation (stress balls, rotating ball in hand, putting coins in hand = fingertip to/from palm translation), UBE    Consulted and Agree with Plan of Care Patient   Sister            Patient will benefit from skilled therapeutic intervention in order to improve the following deficits and impairments:   Body Structure / Function / Physical Skills: ADL, Obesity, Strength, Dexterity, Pain, UE functional use, ROM, Coordination, Flexibility, Sensation, FMC, Decreased knowledge of precautions, Cardiopulmonary status limiting activity, Decreased knowledge of use of DME       Visit Diagnosis: Hemiplegia and 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)  Pain in left arm  Other lack of coordination  Muscle weakness (generalized)  Stiffness of left hand, not elsewhere classified  Other abnormalities of gait and mobility    Problem List Patient Active Problem List   Diagnosis Date Noted   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   Cerebrovascular accident (CVA) of right basal ganglia (HCC) 01/02/2021   CVA (cerebral vascular accident) (HCC) 12/29/2020   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 [place], [place], [address] Phone: [phone]   Fax:  [phone]  Name: Aaron Gibson MRN: [id number] Date of Birth: 12/10/1966

## 2021-07-25 NOTE — Patient Instructions (Signed)
Keep arm low for all exercises, only perform in the pain free ROM Strengthening: Resisted Flexion   Hold tubing with _left____ arm(s) at side. Pull forward and up. Move shoulder through pain-free range of motion. Repeat __10__ times per set.  Do _1-2_ sessions per day , every other day   Strengthening: Resisted Extension   Hold tubing in _left____ hand(s), arm forward. Pull arm back, elbow straight. Repeat _10___ times per set. Do _1-2___ sessions per day, every other day.   Resisted Horizontal Abduction: Bilateral   Sit or stand, tubing in both hands, arms out in front. Keeping arms straight, pinch shoulder blades together and stretch arms out. Repeat _10___ times per set. Do _1-2___ sessions per day, every other day.   Elbow Flexion: Resisted   With tubing held in __left____ hand(s) and other end secured under foot, curl arm up as far as possible. Repeat _10___ times per set. Do _1-2___ sessions per day, every other day.    Elbow Extension: Resisted   Sit or stand,  hold with other hand) and ___left____ elbow bent. Straighten elbow. Repeat _10___ times per set.  Do _1-2___ sessions per day, every other day.   Copyright  VHI. All rights reserved.

## 2021-07-25 NOTE — Therapy (Signed)
[facility] [address] [place], [place], [address] Phone: [phone]   Fax:  [phone]  Physical Therapy Treatment/Re-Cert  Patient Details  Name: Aaron Gibson MRN: [id number] Date of Birth: [birth year] Referring Provider (PT): Letta Pate Luanna Salk, MD   Encounter Date: 07/25/2021   PT End of Session - 07/25/21 1355     Visit Number 5    Number of Visits 7    Date for PT Re-Evaluation 08/22/21    Authorization Type Medicaid    PT Start Time 0931    PT Stop Time 1015    PT Time Calculation (min) 44 min    Equipment Utilized During Treatment --    Activity Tolerance Patient tolerated treatment well    Behavior During Therapy [facility] for tasks assessed/performed             Past Medical History:  Diagnosis Date   Hypertension    untreated   Morbid obesity ([place])     Past Surgical History:  Procedure Laterality Date   BUBBLE STUDY  01/02/2021   Procedure: BUBBLE STUDY;  Surgeon: Freada Bergeron, MD;  Location: [facility];  Service: Cardiovascular;;   TEE WITHOUT CARDIOVERSION N/A 01/02/2021   Procedure: TRANSESOPHAGEAL ECHOCARDIOGRAM (TEE);  Surgeon: Freada Bergeron, MD;  Location: [facility] ENDOSCOPY;  Service: Cardiovascular;  Laterality: N/A;   UMBILICAL HERNIA REPAIR N/A 03/15/2015   Procedure: UMBILICAL HERNIA REPAIR WITH MESH;  Surgeon: Coralie Keens, MD;  Location: [facility];  Service: General;  Laterality: N/A;    Vitals:   07/25/21 0935  BP: 139/85  Pulse: 82     Subjective Assessment - 07/25/21 0932     Subjective Doing well, pain is getting better.    Patient is accompained by: Family member   sister   Pertinent History HTN, obesity, chronic back pain.    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 No/denies               07/25/21 0936  Assessment  Medical Diagnosis L Hemiparesis  from R CVA  Referring Provider (PT) Kirsteins, Luanna Salk, MD  Functional Gait  Assessment  Gait assessed  Yes  Gait Level Surface 2 (7.0)  Change in Gait Speed 2  Gait with Horizontal Head Turns 3  Gait with Vertical Head Turns 2  Gait and Pivot Turn 3  Step Over Obstacle 3  Gait with Narrow Base of Support 3  Gait with Eyes Closed 2 (7.94)  Ambulating Backwards 3  Steps 3  Total Score 26  FGA comment: 26/30 = low fall risk     [facility] PT Assessment - 07/25/21 0943       Assessment   Medical Diagnosis L Hemiparesis from R CVA    Referring Provider (PT) Charlett Blake, MD      Strength   Left Hip Flexion 4-/5    Left Hip Extension 4-/5    Left Hip ABduction 4+/5    Left Knee Flexion 5/5    Left Knee Extension 4+/5    Left Ankle Plantar Flexion 3-/5                           [facility] Adult PT Treatment/Exercise - 07/25/21 0943       Transfers   Comments x10 reps staggered stance sit <> stands with  LLE posteriorly on blue air ex      Ambulation/Gait   Ambulation/Gait Yes    Ambulation/Gait Assistance 5: Supervision    Assistive device None    Gait Pattern Step-through pattern;Decreased dorsiflexion - left;Decreased weight shift to left;Left foot flat;Decreased arm swing - left   decr push off LLE   Ambulation Surface Level;Indoor    Gait velocity 11.47 seconds = 2.85 ft/sec      Exercises   Exercises Other Exercises    Other Exercises  with blue tband around thighs at countertop; side stepping down and back x2 reps, marching down and back x2 reps with cues for slowed and controlled for SLS. at countertop: performed mini squats and raise up onto heels x10 reps, attempted performing mini squat and then a mini jump 2 x 5 reps, pt with incr difficulty with this                Access Code: [email or web address] URL: [email or web address] Date: 07/25/2021 Prepared by: Janann August  Reviewed HEP and upgraded bolded below:    Exercises Gastroc Stretch on Wall - 2 x daily - 5 x weekly - 3 sets - 20-30 hold Heel Toe Raises with Counter Support - 1 x daily - 5 x weekly - 2 sets - 10 reps Standing Marching - 1 x daily - 5 x weekly - 2 sets - 10 reps Clamshell - 1 x daily - 5 x weekly - 2 sets - 10 reps Mini Squat with Counter Support - 1 x daily - 5 x weekly - 2 sets - 10 reps - adding in mini squat with heel raise for PF strengthening  Marching Bridge - 1 x daily - 5 x weekly - 2 sets - 5 reps - lifting into bridge with LLE and marching RLE, and then repeating with other side for hip flexion strengthening for LLE    PT Education - 07/25/21 1354     Education Details progress towards goals, updated to HEP    Person(s) Educated Patient    Methods Explanation;Demonstration;Handout    Comprehension Verbalized understanding;Returned demonstration              PT Short Term Goals - 07/18/21 0947       PT SHORT TERM GOAL #1   Title Pt will be independent with initial HEP in order to build upon functional gains made in therapy. ALL STGS DUE AFTER 4TH VISIT.    Baseline pt reports compliance with HEP.    Time 4   visits   Status Achieved    Target Date 07/06/21      PT SHORT TERM GOAL #2   Title Pt will undergo further assessment of L foot up brace to assist with foot clearance during gait when pt is more fatigued.    Baseline met    Time 4   VISITS   Status Achieved      PT SHORT TERM GOAL #3   Title Pt will decr 5x sit <> stand time to 13 seconds or less without UE support to demo improved functional BLE strength.    Baseline 15.68 seconds no UE support; 14.7 seconds on 07/18/21    Time 4   visits   Status Not Met      PT SHORT TERM GOAL #4   Title Perform 6MWT with LTG written to demo improved walking endurance for community distances.    Baseline 982' on 06/19/21 - LTG written    Time 4   visits  Status Achieved               PT Long Term Goals - 07/25/21 0944       PT LONG TERM GOAL  #1   Title Pt will be independent with final HEP in order to build upon functional gains made in therapy.    Baseline pt performing exercise program consistently - will continue to benefit from additions/updates.    Time 7   visits   Status On-going      PT LONG TERM GOAL #2   Title Pt will improve 6MWT distance by at least 150' in order to demo improved walking endurance for community distances.    Baseline 928'    Time 7   visits   Status On-going      PT LONG TERM GOAL #3   Title Pt will improve gait speed to at least 3.0 ft/sec to demo improved community mobility.    Baseline 2.71 ft/sec - 2.85 ft/sec on 07/25/21    Time 7   visits   Status Not Met      PT LONG TERM GOAL #4   Title Pt will improve FGA score to at least a 26/30 in order to demo decr fall risk.    Baseline 23/30 on 06/08/21 - 26/30 on 07/25/21    Time 7   visits   Status Achieved            Revised/updated LTGs for re-cert:    PT Long Term Goals - 07/25/21 1429       PT LONG TERM GOAL #1   Title Pt will be independent with final HEP in order to build upon functional gains made in therapy.    Baseline pt performing exercise program consistently - will continue to benefit from additions/updates.    Time 7   visits   Status On-going      PT LONG TERM GOAL #2   Title Pt will improve 6MWT distance by at least 150' in order to demo improved walking endurance for community distances.    Baseline 928'    Time 7   visits   Status On-going      PT LONG TERM GOAL #3   Title Pt will improve gait speed to at least 3.0 ft/sec to demo improved community mobility.    Baseline 2.71 ft/sec - 2.85 ft/sec on 07/25/21    Time 7   visits   Status On-going      PT LONG TERM GOAL #4   Title Pt will improve FGA score to at least a 28/30 in order to demo decr fall risk.    Baseline 23/30 on 06/08/21 - 26/30 on 07/25/21    Time 7   visits   Status Revised                  Plan - 07/25/21 1401     Clinical  Impression Statement Began to check pt's LTGs today Updated pt's HEP and pt is performing consistently at home - will benefit from ongoing additions and updates. Pt did not meet LTG #3 (performed in 2.85 ft/sec today and was previously 2.71 ft/sec). Pt met LTG #4 - improved FGA score to a 26/30 and is now at a low risk for falls (previously was 23/30). Will need to re-cert to cover remaining 2 visits in pt's original POC to continue to work on balance, strength, endurance, and gait to improve functional mobility. LTGs updated/revised as appropriate.  Personal Factors and Comorbidities Comorbidity 3+;Profession;Past/Current Experience    Comorbidities HTN, obesity, chronic back pain.    Examination-Activity Limitations Locomotion Level;Squat    Examination-Participation Restrictions Occupation;Community Activity    Stability/Clinical Decision Making Stable/Uncomplicated    Rehab Potential Good    PT Frequency 1x / week   2 visits over 4 weeks   PT Duration 4 weeks   2 visits over 4 weeks   PT Treatment/Interventions ADLs/Self Care Home Management;Gait training;Therapeutic activities;Balance training;Stair training;Therapeutic exercise;Functional mobility training;Neuromuscular re-education;Orthotic Fit/Training;Patient/family education;Vestibular    PT Next Visit Plan check BP.strength for L hip extensors, hip flexors and PF. try warmup on elliptical. balance on unlevel surfaces, vision removed.    Consulted and Agree with Plan of Care Patient;Family member/caregiver    Family Member Consulted pt's sister             Patient will benefit from skilled therapeutic intervention in order to improve the following deficits and impairments:  Abnormal gait, Decreased activity tolerance, Decreased balance, Decreased coordination, Decreased endurance, Decreased strength  Visit Diagnosis: Hemiplegia and 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)  Muscle weakness  (generalized)  Unsteadiness on feet     Problem List Patient Active Problem List   Diagnosis Date Noted   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 [place], [place], [address] Phone: [phone]   Fax:  [phone]  Name: Lorin Gawron MRN: [id number] Date of Birth: May 19, 1966

## 2021-07-27 ENCOUNTER — Ambulatory Visit: Payer: Medicaid Other | Admitting: Physical Therapy

## 2021-07-27 ENCOUNTER — Ambulatory Visit: Payer: Medicaid Other | Admitting: Occupational Therapy

## 2021-07-31 ENCOUNTER — Other Ambulatory Visit: Payer: Self-pay

## 2021-07-31 ENCOUNTER — Ambulatory Visit: Payer: Medicaid Other | Admitting: Occupational Therapy

## 2021-07-31 DIAGNOSIS — M25642 Stiffness of left hand, not elsewhere classified: Secondary | ICD-10-CM | POA: Diagnosis not present

## 2021-07-31 DIAGNOSIS — M6281 Muscle weakness (generalized): Secondary | ICD-10-CM | POA: Diagnosis not present

## 2021-07-31 DIAGNOSIS — R278 Other lack of coordination: Secondary | ICD-10-CM | POA: Diagnosis not present

## 2021-07-31 DIAGNOSIS — R2689 Other abnormalities of gait and mobility: Secondary | ICD-10-CM | POA: Diagnosis not present

## 2021-07-31 DIAGNOSIS — R2681 Unsteadiness on feet: Secondary | ICD-10-CM | POA: Diagnosis not present

## 2021-07-31 DIAGNOSIS — M79602 Pain in left arm: Secondary | ICD-10-CM | POA: Diagnosis not present

## 2021-07-31 DIAGNOSIS — I69354 Hemiplegia and hemiparesis following cerebral infarction affecting left non-dominant side: Secondary | ICD-10-CM | POA: Diagnosis not present

## 2021-07-31 NOTE — Therapy (Signed)
[facility] [address] [place], [place], [address] Phone: [phone]   Fax:  [phone]  Occupational Therapy Treatment  Patient Details  Name: Aaron Gibson MRN: [id number] Date of Birth: [birth year] Referring Provider (OT): Dr Claudette Laws   Encounter Date: 07/31/2021   OT End of Session - 07/31/21 0913     Visit Number 10    Number of Visits 20    Date for OT Re-Evaluation 08/16/21    Authorization Type Approved 20 O.T. visits 06/19/21 - 10/23/21    Authorization - Visit Number 9    Authorization - Number of Visits 20    OT Start Time 0845    OT Stop Time 0923    OT Time Calculation (min) 38 min    Activity Tolerance Patient tolerated treatment well    Behavior During Therapy [facility] for tasks assessed/performed;Flat affect             Past Medical History:  Diagnosis Date   Hypertension    untreated   Morbid obesity (HCC)     Past Surgical History:  Procedure Laterality Date   BUBBLE STUDY  01/02/2021   Procedure: BUBBLE STUDY;  Surgeon: Meriam Sprague, MD;  Location: [facility] ENDOSCOPY;  Service: Cardiovascular;;   TEE WITHOUT CARDIOVERSION N/A 01/02/2021   Procedure: TRANSESOPHAGEAL ECHOCARDIOGRAM (TEE);  Surgeon: Meriam Sprague, MD;  Location: [facility] ENDOSCOPY;  Service: Cardiovascular;  Laterality: N/A;   UMBILICAL HERNIA REPAIR N/A 03/15/2015   Procedure: UMBILICAL HERNIA REPAIR WITH MESH;  Surgeon: Abigail Miyamoto, MD;  Location: [facility] OR;  Service: General;  Laterality: N/A;    There were no vitals filed for this visit.   Subjective Assessment - 07/31/21 0847     Subjective  Pt eports shoulder pain only with malpositioning    Pertinent History HTN, obesiety, L weakness following CVA in 12/28/2020    Patient Stated Goals Pt would like for decresaed shoulder pain, increased fine motor skills.    Currently in Pain? No/denies    Pain Onset More than a month ago                           OT Treatments/Exercises (OP) - 07/31/21 0001       Exercises   Exercises Hand;Shoulder      Shoulder Exercises: ROM/Strengthening   UBE (Upper Arm Bike) x 8 min (4 min forward, 4 min backward), level 5 resistance      Hand Exercises   Other Hand Exercises Gripper set at level 4 resistance to pick up blocks Lt hand for sustained grip strength      Fine Motor Coordination (Hand/Wrist)   Fine Motor Coordination In hand manipuation training;Manipulating coins    In Hand Manipulation Training Translating stress balls in Lt hand, rotating small ball in Lt fingertips clockwise and counterclockwise, fingertip to/from palm translation with marker    Manipulating coins Manipulating up to 5 coins in hand for fingertip to/from palm translation             Discussed d/c next session - pt in agreement.  Lt grip = 61.9 lbs (Rt = 117 lbs)         OT Short Term Goals - 07/10/21 0956       OT SHORT TERM GOAL #1   Title Pt will be Mod I with initial HEP L UE    Baseline Dependent    Time 5    Period Weeks  Status Achieved    Target Date 07/12/21      OT SHORT TERM GOAL #2   Title Pt will be Mod I coordination HEP left UE    Baseline Dependent    Time 5    Period Weeks    Status Achieved    Target Date 07/12/21      OT SHORT TERM GOAL #3   Title Pt will reports decreased pain in L UE during functional reach as evidenced by rating of 4/10 or less during simulated reach for UB Dressing.    Baseline 7/10    Time 5    Period Weeks    Status Achieved    Target Date 07/12/21      OT SHORT TERM GOAL #4   Title Pt will I'ly state possible warning s/s of CVA    Baseline 06/07/2021 Requires Min VC's    Time 5    Period Weeks    Status Achieved    Target Date 07/12/21               OT Long Term Goals - 07/31/21 0910       OT LONG TERM GOAL #1   Title Pt will be Mod I with upgraded HEP for coordination, neuromuscular retraining/rehab L UE     Baseline Dependent    Time 10    Period Weeks    Status Achieved      OT LONG TERM GOAL #2   Title Pt will demonstrate improved coordination as seen by improved 9 hole peg score on left hand by 5 seconds or more    Baseline 06/07/2021: left 35.71 seconds    Time 10    Period Weeks    Status Achieved   07/18/21: 26.65 sec     OT LONG TERM GOAL #3   Title Pt will demonstrate painfree active ROM LE UE to 120* flexion during simulated activities/reach    Baseline 06/07/2021 L - pain at 90*    Time 10    Period Weeks    Status On-going      OT LONG TERM GOAL #4   Title Pt will demonstrate overall improved grip strength left UE by 15# or more per JAMAR dynamometer.    Baseline 06/07/2021 L = 37.9# vs R = 115#    Time 10    Period Weeks    Status Achieved   07/18/21: 61.2 lbs                  Plan - 07/31/21 0910     Clinical Impression Statement Pt is progressing towards goals with improving shoulder pain and grip strength.    OT Occupational Profile and History Problem Focused Assessment - Including review of records relating to presenting problem    Occupational performance deficits (Please refer to evaluation for details): ADL's    Body Structure / Function / Physical Skills ADL;Obesity;Strength;Dexterity;Pain;UE functional use;ROM;Coordination;Flexibility;Sensation;FMC;Decreased knowledge of precautions;Cardiopulmonary status limiting activity;Decreased knowledge of use of DME    Rehab Potential Good    Clinical Decision Making Limited treatment options, no task modification necessary    Comorbidities Affecting Occupational Performance: May have comorbidities impacting occupational performance    Modification or Assistance to Complete Evaluation  No modification of tasks or assist necessary to complete eval    OT Frequency 2x / week    OT Duration Other (comment)   10 weeks   OT Treatment/Interventions Self-care/ADL training;Fluidtherapy;Moist Heat;DME and/or AE  instruction;Therapeutic activities;Ultrasound;Therapeutic exercise;Coping strategies training;Cryotherapy;Neuromuscular education;Passive range  of motion;Electrical Stimulation;Manual Therapy;Patient/family education;Other (comment)   ? Iontophoresis L RTC area if appropriate using dexamethasone sodium phosphate.   Plan assess remaining LTG, work on shoulder, d/c next session    Consulted and Agree with Plan of Care Patient   Sister            Patient will benefit from skilled therapeutic intervention in order to improve the following deficits and impairments:   Body Structure / Function / Physical Skills: ADL, Obesity, Strength, Dexterity, Pain, UE functional use, ROM, Coordination, Flexibility, Sensation, FMC, Decreased knowledge of precautions, Cardiopulmonary status limiting activity, Decreased knowledge of use of DME       Visit Diagnosis: Other lack of coordination  Muscle weakness (generalized)    Problem List Patient Active Problem List   Diagnosis Date Noted   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   Cerebrovascular accident (CVA) of right basal ganglia (HCC) 01/02/2021   CVA (cerebral vascular accident) (HCC) 12/29/2020   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 [place], [place], [address] Phone: [phone]   Fax:  [phone]  Name: Albaro Deviney MRN: [id number] Date of Birth: April 10, 1966

## 2021-08-02 ENCOUNTER — Other Ambulatory Visit: Payer: Self-pay

## 2021-08-02 ENCOUNTER — Ambulatory Visit: Payer: Medicaid Other | Admitting: Occupational Therapy

## 2021-08-02 DIAGNOSIS — I69354 Hemiplegia and hemiparesis following cerebral infarction affecting left non-dominant side: Secondary | ICD-10-CM | POA: Diagnosis not present

## 2021-08-02 DIAGNOSIS — M6281 Muscle weakness (generalized): Secondary | ICD-10-CM | POA: Diagnosis not present

## 2021-08-02 DIAGNOSIS — R2681 Unsteadiness on feet: Secondary | ICD-10-CM | POA: Diagnosis not present

## 2021-08-02 DIAGNOSIS — M25642 Stiffness of left hand, not elsewhere classified: Secondary | ICD-10-CM | POA: Diagnosis not present

## 2021-08-02 DIAGNOSIS — M79602 Pain in left arm: Secondary | ICD-10-CM | POA: Diagnosis not present

## 2021-08-02 DIAGNOSIS — R2689 Other abnormalities of gait and mobility: Secondary | ICD-10-CM | POA: Diagnosis not present

## 2021-08-02 DIAGNOSIS — R278 Other lack of coordination: Secondary | ICD-10-CM | POA: Diagnosis not present

## 2021-08-02 NOTE — Therapy (Signed)
Aaron Gibson [address] [place], [place], [address] Phone: [phone]   Fax:  [phone]  Occupational Therapy Treatment  Patient Details  Name: Aaron Gibson MRN: [id number] Date of Birth: 05/08/1966 Referring Provider (OT): Dr Alysia Penna   Encounter Date: 08/02/2021   OT End of Session - 08/02/21 0919     Visit Number 11    Number of Visits 20    Date for OT Re-Evaluation 08/16/21    Authorization Type Approved 20 O.T. visits 06/19/21 - 10/23/21    Authorization - Visit Number 10    Authorization - Number of Visits 20    OT Start Time 0850    OT Stop Time 0930    OT Time Calculation (min) 40 min    Activity Tolerance Patient tolerated treatment well    Behavior During Therapy [facility] for tasks assessed/performed;Flat affect             Past Medical History:  Diagnosis Date   Hypertension    untreated   Morbid obesity ([place])     Past Surgical History:  Procedure Laterality Date   BUBBLE STUDY  01/02/2021   Procedure: BUBBLE STUDY;  Surgeon: Freada Bergeron, MD;  Location: [place];  Service: Cardiovascular;;   TEE WITHOUT CARDIOVERSION N/A 01/02/2021   Procedure: TRANSESOPHAGEAL ECHOCARDIOGRAM (TEE);  Surgeon: Freada Bergeron, MD;  Location: [facility] ENDOSCOPY;  Service: Cardiovascular;  Laterality: N/A;   UMBILICAL HERNIA REPAIR N/A 03/15/2015   Procedure: UMBILICAL HERNIA REPAIR WITH MESH;  Surgeon: Coralie Keens, MD;  Location: [place];  Service: General;  Laterality: N/A;    There were no vitals filed for this visit.   Subjective Assessment - 08/02/21 0854     Subjective  Pt eports shoulder pain only with malpositioning    Pertinent History HTN, obesiety, L weakness following CVA in 12/28/2020    Patient Stated Goals Pt would like for decresaed shoulder pain, increased fine motor skills.    Currently in Pain? No/denies    Pain Onset More than a month ago              Reviewed  stretches in doorframe for pects and seated BUE wt bearing w/ arms in sh ext and ER. Reviewed theraband ex's for post sh girdle strengthening with caution to prevent sh hiking. Pt shown towel stretch for trunk extension and pects stretch  Pt reaching to high shelf at 130* sh flexion with no pain, only stretch.   UBE x 8 min, (4 min forward, 4 min backward) level 7 resistance                      OT Short Term Goals - 07/10/21 0956       OT SHORT TERM GOAL #1   Title Pt will be Mod I with initial HEP L UE    Baseline Dependent    Time 5    Period Weeks    Status Achieved    Target Date 07/12/21      OT SHORT TERM GOAL #2   Title Pt will be Mod I coordination HEP left UE    Baseline Dependent    Time 5    Period Weeks    Status Achieved    Target Date 07/12/21      OT SHORT TERM GOAL #3   Title Pt will reports decreased pain in L UE during functional reach as evidenced by rating of 4/10 or less during simulated  reach for UB Dressing.    Baseline 7/10    Time 5    Period Weeks    Status Achieved    Target Date 07/12/21      OT SHORT TERM GOAL #4   Title Pt will I'ly state possible warning s/s of CVA    Baseline 06/07/2021 Requires Min VC's    Time 5    Period Weeks    Status Achieved    Target Date 07/12/21               OT Long Term Goals - 08/02/21 0919       OT LONG TERM GOAL #1   Title Pt will be Mod I with upgraded HEP for coordination, neuromuscular retraining/rehab L UE    Baseline Dependent    Time 10    Period Weeks    Status Achieved      OT LONG TERM GOAL #2   Title Pt will demonstrate improved coordination as seen by improved 9 hole peg score on left hand by 5 seconds or more    Baseline 06/07/2021: left 35.71 seconds    Time 10    Period Weeks    Status Achieved   07/18/21: 26.65 sec     OT LONG TERM GOAL #3   Title Pt will demonstrate painfree active ROM LE UE to 120* flexion during simulated activities/reach    Baseline  06/07/2021 L - pain at 90*    Time 10    Period Weeks    Status Achieved   130* sh flexion with only stretch - no pain     OT LONG TERM GOAL #4   Title Pt will demonstrate overall improved grip strength left UE by 15# or more per JAMAR dynamometer.    Baseline 06/07/2021 L = 37.9# vs R = 115#    Time 10    Period Weeks    Status Achieved   07/18/21: 61.2 lbs                  Plan - 08/02/21 0920     Clinical Impression Statement Pt has met all STG's and LTG's at this time. Lt shoulder has improved significantly in ROM and pain,    OT Occupational Profile and History Problem Focused Assessment - Including review of records relating to presenting problem    Occupational performance deficits (Please refer to evaluation for details): ADL's    Body Structure / Function / Physical Skills ADL;Obesity;Strength;Dexterity;Pain;UE functional use;ROM;Coordination;Flexibility;Sensation;FMC;Decreased knowledge of precautions;Cardiopulmonary status limiting activity;Decreased knowledge of use of DME    Rehab Potential Good    Clinical Decision Making Limited treatment options, no task modification necessary    Comorbidities Affecting Occupational Performance: May have comorbidities impacting occupational performance    Modification or Assistance to Complete Evaluation  No modification of tasks or assist necessary to complete eval    OT Frequency 2x / week    OT Duration Other (comment)   10 weeks   OT Treatment/Interventions Self-care/ADL training;Fluidtherapy;Moist Heat;DME and/or AE instruction;Therapeutic activities;Ultrasound;Therapeutic exercise;Coping strategies training;Cryotherapy;Neuromuscular education;Passive range of motion;Electrical Stimulation;Manual Therapy;Patient/family education;Other (comment)   ? Iontophoresis L RTC area if appropriate using dexamethasone sodium phosphate.   Plan D/C O.Donnajean Lopes and Agree with Plan of Care Patient   Sister            Patient will  benefit from skilled therapeutic intervention in order to improve the following deficits and impairments:   Body Structure / Function / Physical  Skills: ADL, Obesity, Strength, Dexterity, Pain, UE functional use, ROM, Coordination, Flexibility, Sensation, FMC, Decreased knowledge of precautions, Cardiopulmonary status limiting activity, Decreased knowledge of use of DME       Visit Diagnosis: Muscle weakness (generalized)    Problem List Patient Active Problem List   Diagnosis Date Noted   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   Cerebrovascular accident (CVA) of right basal ganglia ([place]) 01/02/2021   CVA (cerebral vascular accident) ([place]) 12/29/2020   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 Patient goals were MET. Patient is being discharged due to meeting all goals and reaching max rehab potential.     Geary, Rufo, OTR/L 08/02/2021, 9:21 AM  [facility] [address] [place], [place], [address] Phone: [phone]   Fax:  [phone]  Name: Aaron Gibson MRN: [id number] Date of Birth: [birth year]

## 2021-08-08 ENCOUNTER — Encounter: Payer: Medicaid Other | Admitting: Occupational Therapy

## 2021-08-10 ENCOUNTER — Ambulatory Visit: Payer: Medicaid Other | Admitting: Physical Therapy

## 2021-08-10 ENCOUNTER — Encounter: Payer: Medicaid Other | Admitting: Occupational Therapy

## 2021-08-14 ENCOUNTER — Encounter: Payer: Medicaid Other | Admitting: Occupational Therapy

## 2021-08-16 ENCOUNTER — Encounter: Payer: Medicaid Other | Admitting: Occupational Therapy

## 2021-08-17 ENCOUNTER — Other Ambulatory Visit: Payer: Self-pay | Admitting: Medical

## 2021-08-17 ENCOUNTER — Encounter: Payer: Self-pay | Admitting: Physical Therapy

## 2021-08-17 ENCOUNTER — Ambulatory Visit: Payer: Medicaid Other | Admitting: Physical Therapy

## 2021-08-17 ENCOUNTER — Other Ambulatory Visit: Payer: Self-pay

## 2021-08-17 VITALS — BP 151/98 | HR 84

## 2021-08-17 DIAGNOSIS — R2689 Other abnormalities of gait and mobility: Secondary | ICD-10-CM | POA: Diagnosis not present

## 2021-08-17 DIAGNOSIS — R278 Other lack of coordination: Secondary | ICD-10-CM | POA: Diagnosis not present

## 2021-08-17 DIAGNOSIS — M6281 Muscle weakness (generalized): Secondary | ICD-10-CM

## 2021-08-17 DIAGNOSIS — R2681 Unsteadiness on feet: Secondary | ICD-10-CM | POA: Diagnosis not present

## 2021-08-17 DIAGNOSIS — M79602 Pain in left arm: Secondary | ICD-10-CM | POA: Diagnosis not present

## 2021-08-17 DIAGNOSIS — M25642 Stiffness of left hand, not elsewhere classified: Secondary | ICD-10-CM | POA: Diagnosis not present

## 2021-08-17 DIAGNOSIS — I69354 Hemiplegia and hemiparesis following cerebral infarction affecting left non-dominant side: Secondary | ICD-10-CM

## 2021-08-17 NOTE — Therapy (Signed)
[facility] [address] [place], [place], [address] Phone: [phone]   Fax:  [phone]  Physical Therapy Treatment  Patient Details  Name: Aaron Gibson MRN: [id number] Date of Birth: [birth year] Referring Provider (PT): Aaron Pate Luanna Salk, MD   Encounter Date: 08/17/2021   PT End of Session - 08/17/21 1011     Visit Number 6    Number of Visits 7    Date for PT Re-Evaluation 08/22/21    Authorization Type Medicaid    Authorization - Visit Number 5    Authorization - Number of Visits 6    PT Start Time 0804    PT Stop Time 0845    PT Time Calculation (min) 41 min    Equipment Utilized During Treatment Gait belt    Activity Tolerance Patient tolerated treatment well    Behavior During Therapy Aaron Gibson for tasks assessed/performed             Past Medical History:  Diagnosis Date   Hypertension    untreated   Morbid obesity ([place])     Past Surgical History:  Procedure Laterality Date   BUBBLE STUDY  01/02/2021   Procedure: BUBBLE STUDY;  Surgeon: Aaron Bergeron, MD;  Location: [place];  Service: Cardiovascular;;   TEE WITHOUT CARDIOVERSION N/A 01/02/2021   Procedure: TRANSESOPHAGEAL ECHOCARDIOGRAM (TEE);  Surgeon: Aaron Bergeron, MD;  Location: [place];  Service: Cardiovascular;  Laterality: N/A;   UMBILICAL HERNIA REPAIR N/A 03/15/2015   Procedure: UMBILICAL HERNIA REPAIR WITH MESH;  Surgeon: Aaron Keens, MD;  Location: [facility];  Service: General;  Laterality: N/A;    Vitals:   08/17/21 0812 08/17/21 0834  BP: (!) 142/93 (!) 151/98  Pulse:  84     Subjective Assessment - 08/17/21 0806     Subjective Cancelled the remainder of his OT sessions. Reports strength is coming back and has been able to incr his walking. Reports still having some issues with his balance.    Patient is accompained by: Family member   sister   Pertinent History HTN, obesity, chronic back pain.     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 No/denies                               [facility] Adult PT Treatment/Exercise - 08/17/21 0818       Therapeutic Activites    Therapeutic Activities Other Therapeutic Activities    Other Therapeutic Activities importance of following up with PCP regarding BP due to diastolic value still remaining elevated esp with activity and that when BP is high it limits what pt can safely do in therapy/is also a risk factor due to hx of CVA. discussed POC going forwards as pt would still like to continue with PT (did not use all of his planned OT visits) and still has visits left with Medicaid. will check goals next visit and update POC. continuing to work on strengthening exercises in HEP and purpose of each      Exercises   Exercises Knee/Hip      Knee/Hip Exercises: Aerobic   Elliptical gear 1.0, 2 minutes forwards, 1 minutes backwards for strengthening, activitiy tolerance. brief rest break performed between forwards and backwards  Balance Exercises - 08/17/21 0825       Balance Exercises: Standing   SLS with Vectors Solid surface;Foam/compliant surface;Limitations    SLS with Vectors Limitations with LLE as stance leg and RLE on 4" step tossing green ball into rebounder x10 reps, progressing to RLE on green disc for more compliant surface 2 x 10 reps, with intermittent standing rest break in between due to LLE fatigue    Rockerboard Anterior/posterior;EC    Rockerboard Limitations eyes closed: holding board steady x30 seconds, head turns x5 reps and x10 reps, then head nods x5 reps and x10 reps. eyes open:  alternating toe taps to cones x5 reps B, then forward/cross body cone taps x8 reps B. intermittent taps to bars for balance               PT Education - 08/17/21 1011     Education Details see TA     Person(s) Educated Patient    Methods Explanation    Comprehension Verbalized understanding              PT Short Term Goals - 07/18/21 0947       PT SHORT TERM GOAL #1   Title Pt will be independent with initial HEP in order to build upon functional gains made in therapy. ALL STGS DUE AFTER 4TH VISIT.    Baseline pt reports compliance with HEP.    Time 4   visits   Status Achieved    Target Date 07/06/21      PT SHORT TERM GOAL #2   Title Pt will undergo further assessment of L foot up brace to assist with foot clearance during gait when pt is more fatigued.    Baseline met    Time 4   VISITS   Status Achieved      PT SHORT TERM GOAL #3   Title Pt will decr 5x sit <> stand time to 13 seconds or less without UE support to demo improved functional BLE strength.    Baseline 15.68 seconds no UE support; 14.7 seconds on 07/18/21    Time 4   visits   Status Not Met      PT SHORT TERM GOAL #4   Title Perform 6MWT with LTG written to demo improved walking endurance for community distances.    Baseline 982' on 06/19/21 - LTG written    Time 4   visits   Status Achieved               PT Long Term Goals - 07/25/21 1429       PT LONG TERM GOAL #1   Title Pt will be independent with final HEP in order to build upon functional gains made in therapy.    Baseline pt performing exercise program consistently - will continue to benefit from additions/updates.    Time 7   visits   Status On-going      PT LONG TERM GOAL #2   Title Pt will improve 6MWT distance by at least 150' in order to demo improved walking endurance for community distances.    Baseline 928'    Time 7   visits   Status On-going      PT LONG TERM GOAL #3   Title Pt will improve gait speed to at least 3.0 ft/sec to demo improved community mobility.    Baseline 2.71 ft/sec - 2.85 ft/sec on 07/25/21    Time 7   visits   Status On-going  PT LONG TERM GOAL #4   Title Pt will improve FGA score to at least a  28/30 in order to demo decr fall risk.    Baseline 23/30 on 06/08/21 - 26/30 on 07/25/21    Time 7   visits   Status Revised                   Plan - 08/17/21 1012     Clinical Impression Statement Pt's diastolic BP slightly elevated but still WFL limits to participate in therapy. Continued to educate on importance of follow up with PCP. Initiated warm up on elliptical today in forwards/backwards directions for strengthening/activity tolerance. Remainder of session focused on standing balance strategies with vision removed and LLE SLS. Will continue to progress towards LTGs.    Personal Factors and Comorbidities Comorbidity 3+;Profession;Past/Current Experience    Comorbidities HTN, obesity, chronic back pain.    Examination-Activity Limitations Locomotion Level;Squat    Examination-Participation Restrictions Occupation;Community Activity    Stability/Clinical Decision Making Stable/Uncomplicated    Rehab Potential Good    PT Frequency 1x / week   2 visits over 4 weeks   PT Duration 4 weeks   2 visits over 4 weeks   PT Treatment/Interventions ADLs/Self Care Home Management;Gait training;Therapeutic activities;Balance training;Stair training;Therapeutic exercise;Functional mobility training;Neuromuscular re-education;Orthotic Fit/Training;Patient/family education;Vestibular    PT Next Visit Plan check BP, check goals and will need recert to add visits for PT. strength for L hip extensors, hip flexors and PF. try warmup on elliptical. balance on unlevel surfaces, vision removed. SLS on LLE    Consulted and Agree with Plan of Care Patient;Family member/caregiver    Family Member Consulted pt's sister             Patient will benefit from skilled therapeutic intervention in order to improve the following deficits and impairments:  Abnormal gait, Decreased activity tolerance, Decreased balance, Decreased coordination, Decreased endurance, Decreased strength  Visit Diagnosis: Muscle  weakness (generalized)  Hemiplegia and 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)  Other abnormalities of gait and mobility     Problem List Patient Active Problem List   Diagnosis Date Noted   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   Cerebrovascular accident (CVA) of right basal ganglia ([place]) 01/02/2021   CVA (cerebral vascular accident) ([place]) 12/29/2020   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

## 2021-08-18 ENCOUNTER — Other Ambulatory Visit: Payer: Self-pay | Admitting: Medical

## 2021-08-21 ENCOUNTER — Encounter: Payer: Medicaid Other | Admitting: Occupational Therapy

## 2021-08-22 ENCOUNTER — Ambulatory Visit: Payer: Medicaid Other | Admitting: Physical Therapy

## 2021-08-22 ENCOUNTER — Encounter: Payer: Self-pay | Admitting: Physical Therapy

## 2021-08-22 ENCOUNTER — Other Ambulatory Visit: Payer: Self-pay

## 2021-08-22 VITALS — BP 129/88 | HR 87

## 2021-08-22 DIAGNOSIS — R2689 Other abnormalities of gait and mobility: Secondary | ICD-10-CM | POA: Diagnosis not present

## 2021-08-22 DIAGNOSIS — R2681 Unsteadiness on feet: Secondary | ICD-10-CM | POA: Diagnosis not present

## 2021-08-22 DIAGNOSIS — M79602 Pain in left arm: Secondary | ICD-10-CM | POA: Diagnosis not present

## 2021-08-22 DIAGNOSIS — I69354 Hemiplegia and hemiparesis following cerebral infarction affecting left non-dominant side: Secondary | ICD-10-CM

## 2021-08-22 DIAGNOSIS — M25642 Stiffness of left hand, not elsewhere classified: Secondary | ICD-10-CM | POA: Diagnosis not present

## 2021-08-22 DIAGNOSIS — M6281 Muscle weakness (generalized): Secondary | ICD-10-CM | POA: Diagnosis not present

## 2021-08-22 DIAGNOSIS — R278 Other lack of coordination: Secondary | ICD-10-CM | POA: Diagnosis not present

## 2021-08-22 NOTE — Patient Instructions (Signed)
Access Code: [email or web address] URL: [email or web address] Date: 08/22/2021 Prepared by: Sherlie Ban  Exercises Gastroc Stretch on Wall - 2 x daily - 5 x weekly - 3 sets - 20-30 hold Heel Toe Raises with Counter Support - 1 x daily - 5 x weekly - 2 sets - 10 reps Standing Marching - 1 x daily - 5 x weekly - 2 sets - 10 reps Clamshell - 1 x daily - 5 x weekly - 2 sets - 10 reps Mini Squat with Counter Support - 1 x daily - 5 x weekly - 2 sets - 10 reps Marching Bridge - 1 x daily - 5 x weekly - 2 sets - 5 reps

## 2021-08-23 ENCOUNTER — Encounter: Payer: Medicaid Other | Admitting: Occupational Therapy

## 2021-08-23 NOTE — Addendum Note (Signed)
Addended by: Drake Leach on: 08/23/2021 09:15 AM   Modules accepted: Orders

## 2021-08-23 NOTE — Therapy (Addendum)
[facility] [address] [place], [place], [address] Phone: [phone]   Fax:  [phone]  Physical Therapy Treatment/Re-Cert  Patient Details  Name: Aaron Gibson MRN: [id number] Date of Birth: Feb 24, 1966 Referring Provider (PT): Letta Pate Luanna Salk, MD   Encounter Date: 08/22/2021   PT End of Session - 08/23/21 0754     Visit Number 7    Number of Visits 15    Date for PT Re-Evaluation 10/22/21    Authorization Type Medicaid    Authorization - Visit Number 6    Authorization - Number of Visits 6    PT Start Time 1618    PT Stop Time 1700    PT Time Calculation (min) 42 min    Equipment Utilized During Treatment Gait belt    Activity Tolerance Patient tolerated treatment well    Behavior During Therapy [employer] for tasks assessed/performed             Past Medical History:  Diagnosis Date   Hypertension    untreated   Morbid obesity ([place])     Past Surgical History:  Procedure Laterality Date   BUBBLE STUDY  01/02/2021   Procedure: BUBBLE STUDY;  Surgeon: Freada Bergeron, MD;  Location: [facility];  Service: Cardiovascular;;   TEE WITHOUT CARDIOVERSION N/A 01/02/2021   Procedure: TRANSESOPHAGEAL ECHOCARDIOGRAM (TEE);  Surgeon: Freada Bergeron, MD;  Location: [facility];  Service: Cardiovascular;  Laterality: N/A;   UMBILICAL HERNIA REPAIR N/A 03/15/2015   Procedure: UMBILICAL HERNIA REPAIR WITH MESH;  Surgeon: Coralie Keens, MD;  Location: [facility];  Service: General;  Laterality: N/A;    Vitals:   08/22/21 1622  BP: 129/88  Pulse: 87     Subjective Assessment - 08/22/21 1619     Subjective No changes since he was last here. Made an appt with his PCP for this friday for his BP.    Patient is accompained by: Family member   sister   Pertinent History HTN, obesity, chronic back pain.    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 No/denies                [facility] PT Assessment - 08/22/21 1620       Assessment   Medical Diagnosis L Hemiparesis from R CVA    Referring Provider (PT) Charlett Blake, MD    Onset Date/Surgical Date 12/28/20      Prior Function   Level of Independence Independent      6 Minute Walk- Baseline   6 Minute Walk- Baseline yes    BP (mmHg) 129/88    HR (bpm) 87    02 Sat (%RA) 97 %    Modified Borg Scale for Dyspnea 0- Nothing at all      6 Minute walk- Post Test   6 Minute Walk Post Test yes    BP (mmHg) 128/90    HR (bpm) 99    02 Sat (%RA) 95 %    Modified Borg Scale for Dyspnea 0.5- Very, very slight shortness of breath      6 minute walk test results    Aerobic Endurance Distance Walked 1102    Endurance additional comments rates 4/10 fatigue afterwards      Functional Gait  Assessment   Gait assessed  Yes    Gait Level Surface Walks 20  ft in less than 7 sec but greater than 5.5 sec, uses assistive device, slower speed, mild gait deviations, or deviates 6-10 in outside of the 12 in walkway width.   6.65   Change in Gait Speed Able to change speed, demonstrates mild gait deviations, deviates 6-10 in outside of the 12 in walkway width, or no gait deviations, unable to achieve a major change in velocity, or uses a change in velocity, or uses an assistive device.    Gait with Horizontal Head Turns Performs head turns smoothly with no change in gait. Deviates no more than 6 in outside 12 in walkway width    Gait with Vertical Head Turns Performs task with slight change in gait velocity (eg, minor disruption to smooth gait path), deviates 6 - 10 in outside 12 in walkway width or uses assistive device    Gait and Pivot Turn Pivot turns safely within 3 sec and stops quickly with no loss of balance.    Step Over Obstacle Is able to step over 2 stacked shoe boxes taped together (9 in total height) without changing gait speed. No evidence  of imbalance.    Gait with Narrow Base of Support Is able to ambulate for 10 steps heel to toe with no staggering.    Gait with Eyes Closed Walks 20 ft, no assistive devices, good speed, no evidence of imbalance, normal gait pattern, deviates no more than 6 in outside 12 in walkway width. Ambulates 20 ft in less than 7 sec.   6.78   Ambulating Backwards Walks 20 ft, no assistive devices, good speed, no evidence for imbalance, normal gait    Steps Alternating feet, no rail.    Total Score 27                           [facility] Adult PT Treatment/Exercise - 08/22/21 1635       Ambulation/Gait   Ambulation/Gait Yes    Ambulation/Gait Assistance 5: Supervision    Assistive device None    Gait Pattern Step-through pattern;Decreased dorsiflexion - left;Decreased weight shift to left;Left foot flat;Decreased arm swing - left   decr push off with LLE   Ambulation Surface Level;Indoor    Gait velocity 10.63 seconds = 3.08 ft/sec             Access Code: [email or web address] URL: [email or web address] Date: 08/22/2021 Prepared by: Janann August  Verbally reviewed exercises with pt for pt to perform while he is on hold for the next month.   Exercises Gastroc Stretch on Wall - 2 x daily - 5 x weekly - 3 sets - 20-30 hold Heel Toe Raises with Counter Support - 1 x daily - 5 x weekly - 2 sets - 10 reps Standing Marching - 1 x daily - 5 x weekly - 2 sets - 10 reps - upgraded to use of red tband  Clamshell - 1 x daily - 5 x weekly - 2 sets - 10 reps Mini Squat with Counter Support - 1 x daily - 5 x weekly - 2 sets - 10 reps Marching Bridge - 1 x daily - 5 x weekly - 2 sets - 5 reps       PT Education - 08/23/21 0753     Education Details results of LTGs, POC going forwards (pt wants to go on hold for a month and resume in october)    Person(s) Educated Patient    Methods Explanation  Comprehension Verbalized understanding              PT Short Term Goals -  07/18/21 0947       PT SHORT TERM GOAL #1   Title Pt will be independent with initial HEP in order to build upon functional gains made in therapy. ALL STGS DUE AFTER 4TH VISIT.    Baseline pt reports compliance with HEP.    Time 4   visits   Status Achieved    Target Date 07/06/21      PT SHORT TERM GOAL #2   Title Pt will undergo further assessment of L foot up brace to assist with foot clearance during gait when pt is more fatigued.    Baseline met    Time 4   VISITS   Status Achieved      PT SHORT TERM GOAL #3   Title Pt will decr 5x sit <> stand time to 13 seconds or less without UE support to demo improved functional BLE strength.    Baseline 15.68 seconds no UE support; 14.7 seconds on 07/18/21    Time 4   visits   Status Not Met      PT SHORT TERM GOAL #4   Title Perform 6MWT with LTG written to demo improved walking endurance for community distances.    Baseline 982' on 06/19/21 - LTG written    Time 4   visits   Status Achieved                PT Long Term Goals - 08/22/21 1632       PT LONG TERM GOAL #1   Title Pt will be independent with final HEP in order to build upon functional gains made in therapy.    Baseline pt performing exercise program consistently - will continue to benefit from additions/updates.    Time 7   visits   Status On-going      PT LONG TERM GOAL #2   Title Pt will improve 6MWT distance by at least 150' in order to demo improved walking endurance for community distances.    Baseline 928'; 1,102' on 08/22/21    Time 7   visits   Status Achieved      PT LONG TERM GOAL #3   Title Pt will improve gait speed to at least 3.0 ft/sec to demo improved community mobility.    Baseline 2.71 ft/sec - 2.85 ft/sec on 07/25/21; 3.08 ft/sec on 08/22/21    Time 7   visits   Status Achieved      PT LONG TERM GOAL #4   Title Pt will improve FGA score to at least a 28/30 in order to demo decr fall risk.    Baseline 26/30 on 07/25/21; 27/30 on 08/22/21     Time 7   visits   Status Not Met            Revised/ongoing LTGs for re-cert:    PT Long Term Goals - 08/23/21 0913       PT LONG TERM GOAL #1   Title Pt will be independent with final HEP in order to build upon functional gains made in therapy. ALL LTGS DUE 10/22/21    Baseline will continue to benefit from additions/updates.    Time 8   or 15th visit   Period Weeks    Status On-going    Target Date 10/22/21      PT LONG TERM GOAL #2   Title Pt  will improve 6MWT distance to at least 1,200' with fatigue rating of 2/10 or less order to demo improved walking endurance for community distances.    Baseline 928'; 1,102' with 4/10 fatigue rating on 08/22/21    Time 8    Period Weeks    Status Revised      PT LONG TERM GOAL #3   Title Pt will improve gait speed to at least 3.3 ft/sec to demo improved community mobility.    Baseline 2.71 ft/sec - 2.85 ft/sec on 07/25/21; 3.08 ft/sec on 08/22/21    Time 8    Period Weeks    Status Revised      PT LONG TERM GOAL #4   Title Pt will improve FGA score to at least a 29/30 in order to demo decr fall risk.    Baseline 26/30 on 07/25/21; 27/30 on 08/22/21    Time 8    Period Weeks    Status Revised                   Plan - 08/23/21 0907     Clinical Impression Statement Checked pt's LTGs today with pt meeting LTG #2 and #3. Improved 6MWT distance to 1,102' (previously was 928'). Pt improved gait speed to 3.08 ft/sec (previously 2.85 ft/sec). Pt did not meet LTG #4, improved to a 27/30 (from 26/30), but not quite to goal level. Discussed POC with pt and medicaid visits, pt would like to continue with PT, but needs to go on hold for ~1 month due to his kids returning back to school. Pt would like to use remainder of visits on PT vs. OT. Discussed will re-cert for an additional 8 visits to continue to work on gait, balance, endurance, strength in order to improve functional mobility and independence and decr fall risk. Pt in agreement  with plan. LTGs updated as appropriate.    Personal Factors and Comorbidities Comorbidity 3+;Profession;Past/Current Experience    Comorbidities HTN, obesity, chronic back pain.    Examination-Activity Limitations Locomotion Level;Squat    Examination-Participation Restrictions Occupation;Community Activity    Stability/Clinical Decision Making Stable/Uncomplicated    Rehab Potential Good    PT Frequency 2x / week   8 visits over 8 weeks   PT Duration 8 weeks   8 visits over 8 weeks   PT Treatment/Interventions ADLs/Self Care Home Management;Gait training;Therapeutic activities;Balance training;Stair training;Therapeutic exercise;Functional mobility training;Neuromuscular re-education;Orthotic Fit/Training;Patient/family education;Vestibular    PT Next Visit Plan check BP,  strength for L hip extensors, hip flexors and PF. try warmup on elliptical. balance on unlevel surfaces, vision removed. SLS on LLE    Consulted and Agree with Plan of Care Patient;Family member/caregiver             Patient will benefit from skilled therapeutic intervention in order to improve the following deficits and impairments:  Abnormal gait, Decreased activity tolerance, Decreased balance, Decreased coordination, Decreased endurance, Decreased strength  Visit Diagnosis: Muscle weakness (generalized)  Hemiplegia and 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)  Other abnormalities of gait and mobility  Unsteadiness on feet     Problem List Patient Active Problem List   Diagnosis Date Noted   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   Cerebrovascular accident (CVA) of right basal ganglia ([place]) 01/02/2021   CVA (cerebral vascular accident) ([place]) 12/29/2020   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

## 2021-08-25 ENCOUNTER — Other Ambulatory Visit: Payer: Self-pay

## 2021-08-25 ENCOUNTER — Ambulatory Visit: Payer: Medicaid Other | Admitting: Medical

## 2021-08-25 ENCOUNTER — Telehealth: Payer: Self-pay | Admitting: Medical

## 2021-08-25 ENCOUNTER — Encounter: Payer: Self-pay | Admitting: Medical

## 2021-08-25 VITALS — BP 140/90 | HR 83 | Temp 98.3°F | Resp 18 | Ht 69.0 in | Wt 230.6 lb

## 2021-08-25 DIAGNOSIS — E118 Type 2 diabetes mellitus with unspecified complications: Secondary | ICD-10-CM

## 2021-08-25 DIAGNOSIS — I10 Essential (primary) hypertension: Secondary | ICD-10-CM | POA: Diagnosis not present

## 2021-08-25 DIAGNOSIS — E785 Hyperlipidemia, unspecified: Secondary | ICD-10-CM

## 2021-08-25 DIAGNOSIS — E1169 Type 2 diabetes mellitus with other specified complication: Secondary | ICD-10-CM

## 2021-08-25 DIAGNOSIS — M25512 Pain in left shoulder: Secondary | ICD-10-CM

## 2021-08-25 LAB — COMPREHENSIVE METABOLIC PANEL
ALT: 33 U/L (ref 0–53)
AST: 20 U/L (ref 0–37)
Albumin: 4 g/dL (ref 3.5–5.2)
Alkaline Phosphatase: 99 U/L (ref 39–117)
BUN: 11 mg/dL (ref 6–23)
CO2: 27 mEq/L (ref 19–32)
Calcium: 9.2 mg/dL (ref 8.4–10.5)
Chloride: 105 mEq/L (ref 96–112)
Creatinine, Ser: 0.91 mg/dL (ref 0.40–1.50)
GFR: 96.61 mL/min (ref >60.00)
Glucose, Bld: 86 mg/dL (ref 70–99)
Potassium: 4 mEq/L (ref 3.5–5.1)
Sodium: 141 mEq/L (ref 135–145)
Total Bilirubin: 0.6 mg/dL (ref 0.2–1.2)
Total Protein: 6.9 g/dL (ref 6.0–8.3)

## 2021-08-25 LAB — LIPID PANEL
Cholesterol: 138 mg/dL (ref 0–200)
HDL: 44.3 mg/dL (ref >39.00)
LDL Cholesterol: 77 mg/dL (ref 0–99)
NonHDL: 94.02
Total CHOL/HDL Ratio: 3
Triglycerides: 86 mg/dL (ref 0.0–149.0)
VLDL: 17.2 mg/dL (ref 0.0–40.0)

## 2021-08-25 LAB — HEMOGLOBIN A1C: Hgb A1c MFr Bld: 6.6 % — ABNORMAL HIGH (ref 4.6–6.5)

## 2021-08-25 NOTE — Addendum Note (Signed)
Addended by: Rosita Kea on: 08/25/2021 10:48 AM   Modules accepted: Orders

## 2021-08-25 NOTE — Addendum Note (Signed)
Addended by: Rosita Kea on: 08/25/2021 10:49 AM   Modules accepted: Orders

## 2021-08-25 NOTE — Patient Instructions (Addendum)
You have high blood pressure your reading when I checked were tightly controlled 120/80 twice but when checked last time was 140/90. You bp reading at home are very high. Question accuracy of your machine.  I want you to check  your bp with your machine at home. Check 3 times each check 5 minutes apart. Write those readings down for both arms. Schedule nurse bp check. Want them to check manual readings. Bring your machine in that day.  Currently stay on amlodpine 10 mg daily.  For high cholesterol get cmp and lipid panel today. Continue atorvastatin.  Check a1c today. Continue metformin and well see if dose adjusment or new med needed.  For left shoulder pain and decreased rom refer to orthopedist.  Follow up date to be determined after lab review and bp review.

## 2021-08-25 NOTE — Addendum Note (Signed)
Addended by: CREFT, Feliberto Harts on: 08/25/2021 10:32 AM   Modules accepted: Orders

## 2021-08-25 NOTE — Telephone Encounter (Signed)
Referral placed.

## 2021-08-25 NOTE — Telephone Encounter (Signed)
I tried to placed referal for orthopedist. Dx shoulder pain. Frozen shoulder vs rotator cuff.   I keep getting error message. Not enough memory. Will you try to put in referral ans send for me to sign. If you get same message will you notify kim so ticket can be put in??

## 2021-08-25 NOTE — Progress Notes (Signed)
Subjective:    Patient ID: Aaron Gibson, male    DOB: 1966-06-20, 55 y.o.   MRN: [id number]  HPI  Pt in for follow up.  Pt has hx of htn. Pt has at home are much higher. 165-185/90-95. No ha or gross motor/sensory function deficits.  Pt is on amlodipine 10 mg daily.  No gross motor or sensory function. No chest pain.  Since stroke has been on statin.    Pt has hx of stroke. Had significant deficts to left upper and lower ext in past after stroke in early January.  He has made a lot of progress. Left lower leg still mild weak after walking long time. No dizzy. He has close to normal left upper ext strength. Has good fine motor coordination.  Pt states now has left anterior shoulder pain. He states feel stiff and has reduced range of motion. His therapist told him he needed to see specialist.  Pt still smokes but states less. 2-3 cigarettes.    Review of Systems  Constitutional:  Negative for chills, fatigue and fever.  HENT:  Negative for dental problem.   Respiratory:  Negative for cough, chest tightness, shortness of breath and wheezing.   Cardiovascular:  Negative for chest pain and palpitations.  Gastrointestinal:  Negative for abdominal pain.  Musculoskeletal:        Left side shoulder stiffness, reduce range of motion and anterior aspect pain on range of motion.   Past Medical History:  Diagnosis Date   Hypertension    untreated   Morbid obesity ([place])      Social History   Socioeconomic History   Marital status: Single    Spouse name: Not on file   Number of children: Not on file   Years of education: Not on file   Highest education level: Not on file  Occupational History   Occupation: Unemployed   Tobacco Use   Smoking status: Former    Packs/day: 1.00    Years: 29.00    Pack years: 29.00    Types: Cigarettes   Smokeless tobacco: Never   Tobacco comments:    quit 12/28/2020  Vaping Use   Vaping Use: Never used  Substance and Sexual Activity    Alcohol use: Yes    Comment: occasionally holidays   Drug use: No   Sexual activity: Yes  Other Topics Concern   Not on file  Social History Narrative   Lives with daughter   Right Handed   Caffeine 2x week   Social Determinants of Health   Financial Resource Strain: Not on file  Food Insecurity: Not on file  Transportation Needs: Not on file  Physical Activity: Not on file  Stress: Not on file  Social Connections: Not on file  Intimate Partner Violence: Not on file    Past Surgical History:  Procedure Laterality Date   BUBBLE STUDY  01/02/2021   Procedure: BUBBLE STUDY;  Surgeon: Freada Bergeron, MD;  Location: [facility];  Service: Cardiovascular;;   TEE WITHOUT CARDIOVERSION N/A 01/02/2021   Procedure: TRANSESOPHAGEAL ECHOCARDIOGRAM (TEE);  Surgeon: Freada Bergeron, MD;  Location: [facility];  Service: Cardiovascular;  Laterality: N/A;   UMBILICAL HERNIA REPAIR N/A 03/15/2015   Procedure: UMBILICAL HERNIA REPAIR WITH MESH;  Surgeon: Coralie Keens, MD;  Location: [facility];  Service: General;  Laterality: N/A;    No family history on file.  Not on File  Current Outpatient Medications on File Prior to Visit  Medication Sig Dispense Refill  Accu-Chek Softclix Lancets lancets Use to check Blood sugars once a day. Dx E11.8 100 each 1   acetaminophen (TYLENOL) 325 MG tablet Take 2 tablets (650 mg total) by mouth every 4 (four) hours as needed for mild pain (or temp > 37.5 C (99.5 F)).     amLODipine (NORVASC) 10 MG tablet TAKE 1 TABLET(10 MG) BY MOUTH DAILY 90 tablet 0   aspirin 81 MG chewable tablet Chew 1 tablet (81 mg total) by mouth daily.     atorvastatin (LIPITOR) 80 MG tablet TAKE 1 TABLET(80 MG) BY MOUTH DAILY 30 tablet 5   Blood Glucose Monitoring Suppl (ACCU-CHEK GUIDE ME) w/Device KIT Use to check Blood sugars once a day. Dx E11.8 1 kit 0   buPROPion (WELLBUTRIN XL) 150 MG 24 hr tablet Take 1 tablet (150 mg total) by mouth daily. 30 tablet 3    clopidogrel (PLAVIX) 75 MG tablet TAKE 1 TABLET (75 MG TOTAL) BY MOUTH DAILY. 13 tablet 0   glucose blood (ACCU-CHEK GUIDE) test strip Use to check Blood sugars once a day. Dx E11.8 100 each 1   lidocaine (LIDODERM) 5 % PLACE 1 PATCH ONTO THE SKIN DAILY. REMOVE & DISCARD PATCH WITHIN 12 HOURS OR AS DIRECTED BY MD 30 patch 0   metFORMIN (GLUCOPHAGE) 500 MG tablet Take 1 tablet (500 mg total) by mouth 2 (two) times daily with a meal. 60 tablet 3   methocarbamol (ROBAXIN) 500 MG tablet TAKE 1 TABLET (500 MG TOTAL) BY MOUTH EVERY EIGHT HOURS. 60 tablet 0   nicotine (NICODERM CQ - DOSED IN MG/24 HR) 7 mg/24hr patch APPLY 1 PATCH (7MG) TAKE ONTO SKIN DAILY X3 WEEKS THEN STOP 28 patch 0   pantoprazole (PROTONIX) 40 MG tablet TAKE 1 TABLET (40 MG TOTAL) BY MOUTH DAILY. 30 tablet 0   traZODone (DESYREL) 100 MG tablet TAKE 1 TABLET (100 MG TOTAL) BY MOUTH AT BEDTIME AS NEEDED FOR SLEEP. 30 tablet 0   No current facility-administered medications on file prior to visit.    BP (!) 144/88 (BP Location: Right Arm, Patient Position: Sitting, Cuff Size: Normal)   Pulse 83   Temp 98.3 F (36.8 C) (Oral)   Resp 18   Ht '5\' 9"'  (1.753 m)   Wt 230 lb 9.6 oz (104.6 kg)   SpO2 97%   BMI 34.05 kg/m        Objective:   Physical Exam  General Mental Status- Alert. General Appearance- Not in acute distress.   Skin General: Color- Normal Color. Moisture- Normal Moisture.  Neck Carotid Arteries- Normal color. Moisture- Normal Moisture. No carotid bruits. No JVD.  Chest and Lung Exam Auscultation: Breath Sounds:-Normal.  Cardiovascular Auscultation:Rythm- Regular. Murmurs & Other Heart Sounds:Auscultation of the heart reveals- No Murmurs.  Abdomen Inspection:-Inspeection Normal. Palpation/Percussion:Note:No mass. Palpation and Percussion of the abdomen reveal- Non Tender, Non Distended + BS, no rebound or guarding.    Neurologic Cranial Nerve exam:- CN III-XII intact(No nystagmus), symmetric  smile. Strength:- Very close 5/5 equal and symmetric strength both upper and lower extremities.(Left side maybe slight weaker)  Left shoulder- has limited range of motion/abduction. Can raise arm above shoulder.     Assessment & Plan:   Patient Instructions  You have high blood pressure your reading when I checked were tightly controlled 120/80 twice but when checked last time was 140/90. You bp reading at home are very high. Question accuracy of your machine.  I want you to check  your bp with your machine at home.  Check 3 times each check 5 minutes apart. Write those readings down for both arms. Schedule nurse bp check. Want them to check manual readings. Bring your machine in that day.  Currently stay on amlodpine 10 mg daily.  For high cholesterol get cmp and lipid panel today. Continue atorvastatin.  Check a1c today. Continue metformin and well see if dose adjusment or new med needed.  For left shoulder pain and decreased rom refer to orthopedist.  Follow up date to be determined after lab review and bp review.   Mackie Pai, PA-C

## 2021-08-30 ENCOUNTER — Ambulatory Visit (INDEPENDENT_AMBULATORY_CARE_PROVIDER_SITE_OTHER): Payer: Medicaid Other | Admitting: Medical

## 2021-08-30 ENCOUNTER — Other Ambulatory Visit: Payer: Self-pay

## 2021-08-30 VITALS — BP 138/88 | HR 81

## 2021-08-30 DIAGNOSIS — Z0271 Encounter for disability determination: Secondary | ICD-10-CM

## 2021-08-30 DIAGNOSIS — I10 Essential (primary) hypertension: Secondary | ICD-10-CM

## 2021-08-30 MED ORDER — LOSARTAN POTASSIUM 25 MG PO TABS: 25.0000 mg | ORAL_TABLET | Freq: Every day | ORAL | 2 refills | Status: DC

## 2021-08-30 NOTE — Progress Notes (Signed)
Pt here for Blood pressure check per PCP.  Pt currently takes: amlodipine 10mg   Pt reports compliance with medication.  Patient has not taken medication yet.  Patient has been checking blood pressure at and home and they have been high.   Patient stated that he has been getting lower readings in left side that he had stroke on.   BP Readings from Last 3 Encounters:  08/25/21 140/90  08/22/21 129/88  08/17/21 (!) 151/98   Manual office Right  BP today @ =130/80 HR =81  Left BP today @ =138/88 HR =   Patients blood pressure machine from home. Right  BP-145/88 HR-80  Left  BP-138/90 HR- 80   Pt advised per 08/19/21 to add losartan 25mg  (medication sent in) to take with the amlodipine 10mg , if blood pressure is over 140/90 to recheck 3 times 5 minutes apart, use machine appropriately (explained to patient how to use), and to recheck in 1 month.    Appointment scheduled for 09/29/21 at 11am.

## 2021-08-30 NOTE — Progress Notes (Deleted)
Pt here for Blood pressure check per PCP   Pt currently takes: amlodipine 10mg    Pt reports compliance with medication.  BP Readings from Last 3 Encounters:  08/25/21 140/90  08/22/21 129/88  08/17/21 (!) 151/98     BP today @ = HR =  Pt advised per

## 2021-08-30 NOTE — Patient Instructions (Addendum)
Manual office Right  BP today @ =130/80 HR =81  Left BP today @ =138/88 HR =   Patients blood pressure machine from home. Right  BP-145/88 HR-80  Left  BP-138/90 HR- 802

## 2021-09-05 ENCOUNTER — Ambulatory Visit: Payer: Medicaid Other | Admitting: Orthopaedic Surgery

## 2021-09-05 ENCOUNTER — Ambulatory Visit (INDEPENDENT_AMBULATORY_CARE_PROVIDER_SITE_OTHER): Payer: Medicaid Other

## 2021-09-05 ENCOUNTER — Encounter: Payer: Self-pay | Admitting: Orthopaedic Surgery

## 2021-09-05 ENCOUNTER — Other Ambulatory Visit: Payer: Self-pay

## 2021-09-05 DIAGNOSIS — G8929 Other chronic pain: Secondary | ICD-10-CM

## 2021-09-05 DIAGNOSIS — M25512 Pain in left shoulder: Secondary | ICD-10-CM | POA: Diagnosis not present

## 2021-09-05 NOTE — Progress Notes (Signed)
Office Visit Note   Patient: Aaron Gibson           Date of Birth: 1966-05-26           MRN: [id number] Visit Date: 09/05/2021              Requested by: Esperanza Richters, PA-C [address] [place],  [place] [address] PCP: Esperanza Richters, PA-C   Assessment & Plan: Visit Diagnoses:  1. Chronic left shoulder pain     Plan: Impression is left shoulder biceps tendinitis and subacromial bursitis.  The patient has tried several months of physical therapy as well as a recent subacromial cortisone injection all without relief of symptoms.  I believe it is appropriate to order an MRI of the left shoulder to assess for structural abnormalities.  He will follow-up with [place] once this is been completed.  Follow-Up Instructions: Return for after MRI.   Orders:  Orders Placed This Encounter  Procedures   XR Shoulder Left   No orders of the defined types were placed in this encounter.     Procedures: No procedures performed   Clinical Data: No additional findings.   Subjective: Chief Complaint  Patient presents with   Left Shoulder - Pain    HPI patient is a pleasant 55 year old gentleman who comes in today with left shoulder pain.  This began in February of this past year.  The pain he has is to the anterior aspect of the shoulder and is worse with forward flexion and internal rotation of the shoulder.  He has been taking Tylenol without significant relief.  Of note, he sustained a a CVA in January of this year just prior to the onset of pain.  At that point in time he not only had pain weakness to the left upper extremity following the stroke.  He has been in physical therapy which is helped his strength but not his range of motion or pain.  He recently underwent what sounds like subacromial cortisone injection with little relief of symptoms.  Review of Systems as detailed in HPI.  All others reviewed and are negative.   Objective: Vital Signs: There were no vitals  taken for this visit.  Physical Exam well-developed well-nourished gentleman in no acute distress.  Alert and oriented x3.  Ortho Exam examination of the left shoulder reveals forward flexion to about 140 degrees.  Abduction to 90 degrees.  Full external rotation.  Internal rotation to his back pocket.  Negative empty can test.  Positive speeds test.  4 out of 5 strength throughout.  He is neurovascular intact distally.  Specialty Comments:  No specialty comments available.  Imaging: No results found.   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 No pertinent family history.  Past Surgical History:  Procedure Laterality Date   BUBBLE STUDY  01/02/2021   Procedure: BUBBLE STUDY;  Surgeon: Meriam Sprague, MD;  Location[facility] ENDOSCOPY;  Service: Cardiovascular;;   TEE WITHOUT CARDIOVERSION  N/A 01/02/2021   Procedure: TRANSESOPHAGEAL ECHOCARDIOGRAM (TEE);  Surgeon: Meriam Sprague, MD;  Location: [facility] ENDOSCOPY;  Service: Cardiovascular;  Laterality: N/A;   UMBILICAL HERNIA REPAIR N/A 03/15/2015   Procedure: UMBILICAL HERNIA REPAIR WITH MESH;  Surgeon: Abigail Miyamoto, MD;  Location: [facility] OR;  Service: General;  Laterality: N/A;   Social History   Occupational History   Occupation: Unemployed   Tobacco Use   Smoking status: Former    Packs/day: 1.00    Years: 29.00    Pack years: 29.00    Types: Cigarettes   Smokeless tobacco: Never   Tobacco comments:    quit 12/28/2020  Vaping Use    Vaping Use: Never used  Substance and Sexual Activity   Alcohol use: Yes    Comment: occasionally holidays   Drug use: No   Sexual activity: Yes

## 2021-09-11 ENCOUNTER — Other Ambulatory Visit: Payer: Self-pay

## 2021-09-11 ENCOUNTER — Ambulatory Visit: Payer: Medicaid Other | Admitting: Adult Health

## 2021-09-11 ENCOUNTER — Encounter: Payer: Self-pay | Admitting: Adult Health

## 2021-09-11 VITALS — BP 136/84 | HR 76 | Ht 69.0 in | Wt 230.2 lb

## 2021-09-11 DIAGNOSIS — I639 Cerebral infarction, unspecified: Secondary | ICD-10-CM

## 2021-09-11 DIAGNOSIS — I69354 Hemiplegia and hemiparesis following cerebral infarction affecting left non-dominant side: Secondary | ICD-10-CM

## 2021-09-11 NOTE — Patient Instructions (Addendum)
Continue aspirin 81 mg daily  and atorvastatin 80 mg daily for secondary stroke prevention  Continue to follow up with PCP regarding cholesterol, blood pressure and diabetes management  Maintain strict control of hypertension with blood pressure goal below 130/90, diabetes with hemoglobin A1c goal below 7% and cholesterol with LDL cholesterol (bad cholesterol) goal below 70 mg/dL.   Restart working with therapies next month for hopeful ongoing recovery   Please let me know if you would like to undergo further evaluation for sleep apnea      Followup in the future with me in 6 months or call earlier if needed       Thank you for coming to see [place] at [facility]. I hope we have been able to provide you high quality care today.  You may receive a patient satisfaction survey over the next few weeks. We would appreciate your feedback and comments so that we may continue to improve ourselves and the health of our patients.

## 2021-09-11 NOTE — Progress Notes (Signed)
[facility] [address] [place]. [place] [address] [phone]       OFFICE FOLLOW-UP NOTE  [name] Date of Birth:  03-08-1966 Medical Record Number:  [id number]    Primary neurologist: Dr. Leonie Man Reason for visit: Stroke follow-up   Chief Complaint  Patient presents with   Follow-up    Room 3. Pt is alone. No complaints.        HPI:   Today, 09/11/2021, Aaron Gibson returns for 55-monthstroke follow-up unaccompanied.  Overall stable with residual left sided weakness although notes improvement since prior visit.  Working with PT/OT placed on hold during the month of September and plans to restart next month. C/o left shoulder pain since stroke - recent eval by ortho with plans on obtaining MRI scheduled on 9/28.  Does not use assistive device - no recent falls.  Compliant on aspirin and atorvastatin.  Blood pressure today 136/84.  30-day cardiac event monitor negative for atrial fibrillation.  Sleep smart study completed - did not receive CPAP machine during study. No new concerns at this time.     History provided for reference purposes only Hospital follow-up 03/02/2021 Dr. SLeonie Man Mr. JBurgertis a 55year old African-American male seen today for initial office follow-up visit following hospital admission for stroke in January 2022.RDream Nodalis a 55y.o. male who was evaluated as a Code Stroke in the ED. Hypertensive crisis noted with BP  220/110. TPA was administered. Head CT showed no acute intracranial abnormality. MR Brain revealed acute infarction within the posterior putamen, posterior body of the caudate and radiating white matter tracts on the right. CTA head/ neck showed no large vessel occlusion. EEG was normal without seizure activity. TEE showed a small patent foramen ovale and EF 60-75%.   Patient had a low rope score of 3-4 hence was not considered for PFO closure.  LDL cholesterol was elevated at 138 mg percent and he was started on  Lipitor 80 mg daily.  Hemoglobin A1c was 6.5. Patient was not eligible for loop recorder due to lack of health insurance coverage. DAPT was provided throughout his stay. Covid vaccine was administered.  He remained hemodynamically and neurologically stable throughout his stay. Some improvement in left sided weakness was appreciated. Physical, speech, and occupational therapists were consulted and provided evaluation, treatment and discharge planning recommendations. He was recommended for inpatient rehabilitation which was arranged by the care management team. . Patient states is done well since discharge.  He still has mild left grip weakness and diminished fine motor skills but otherwise is getting better.  He did not do any outpatient therapies.  He has discontinued Plavix and is on aspirin alone which is tolerating well without bruising or bleeding.  His blood pressure is well controlled today it is 137/88.  His fasting sugars are also okay and he is plans to see his primary care physician this week to discuss further management.  He remains on Lipitor 80 mg which is tolerating well without muscle aches and pains.  Patient and could not afford a loop recorder monitoring for A. fib.  He was enrolled in the sleep smart study but randomized to the medical treatment.   ROS:   14 system review of systems is positive for those listed in HPI and all other systems negative  PMH:  Past Medical History:  Diagnosis Date   Hypertension    untreated   [place]     Social History:  Social History   Socioeconomic  History   Marital status: Single    Spouse name: Not on file   Number of children: Not on file   Years of education: Not on file   Highest education level: Not on file  Occupational History   Occupation: Unemployed   Tobacco Use   Smoking status: Former    Packs/day: 1.00    Years: 29.00    Pack years: 29.00    Types: Cigarettes   Smokeless tobacco: Never   Tobacco  comments:    quit 12/28/2020  Vaping Use   Vaping Use: Never used  Substance and Sexual Activity   Alcohol use: Yes    Comment: occasionally holidays   Drug use: No   Sexual activity: Yes  Other Topics Concern   Not on file  Social History Narrative   Lives with daughter   Right Handed   Caffeine 2x week   Social Determinants of Health   Financial Resource Strain: Not on file  Food Insecurity: Not on file  Transportation Needs: Not on file  Physical Activity: Not on file  Stress: Not on file  Social Connections: Not on file  Intimate Partner Violence: Not on file    Medications:   Current Outpatient Medications on File Prior to Visit  Medication Sig Dispense Refill   Accu-Chek Softclix Lancets lancets Use to check Blood sugars once a day. Dx E11.8 100 each 1   acetaminophen (TYLENOL) 325 MG tablet Take 2 tablets (650 mg total) by mouth every 4 (four) hours as needed for mild pain (or temp > 37.5 C (99.5 F)).     amLODipine (NORVASC) 10 MG tablet TAKE 1 TABLET(10 MG) BY MOUTH DAILY 90 tablet 0   aspirin 81 MG chewable tablet Chew 1 tablet (81 mg total) by mouth daily.     atorvastatin (LIPITOR) 80 MG tablet TAKE 1 TABLET(80 MG) BY MOUTH DAILY 30 tablet 5   Blood Glucose Monitoring Suppl (ACCU-CHEK GUIDE ME) w/Device KIT Use to check Blood sugars once a day. Dx E11.8 1 kit 0   buPROPion (WELLBUTRIN XL) 150 MG 24 hr tablet Take 1 tablet (150 mg total) by mouth daily. 30 tablet 3   clopidogrel (PLAVIX) 75 MG tablet TAKE 1 TABLET (75 MG TOTAL) BY MOUTH DAILY. 13 tablet 0   glucose blood (ACCU-CHEK GUIDE) test strip Use to check Blood sugars once a day. Dx E11.8 100 each 1   lidocaine (LIDODERM) 5 % PLACE 1 PATCH ONTO THE SKIN DAILY. REMOVE & DISCARD PATCH WITHIN 12 HOURS OR AS DIRECTED BY MD 30 patch 0   losartan (COZAAR) 25 MG tablet Take 1 tablet (25 mg total) by mouth daily. 30 tablet 2   metFORMIN (GLUCOPHAGE) 500 MG tablet Take 1 tablet (500 mg total) by mouth 2 (two) times  daily with a meal. 60 tablet 3   methocarbamol (ROBAXIN) 500 MG tablet TAKE 1 TABLET (500 MG TOTAL) BY MOUTH EVERY EIGHT HOURS. 60 tablet 0   nicotine (NICODERM CQ - DOSED IN MG/24 HR) 7 mg/24hr patch APPLY 1 PATCH (7MG) TAKE ONTO SKIN DAILY X3 WEEKS THEN STOP 28 patch 0   pantoprazole (PROTONIX) 40 MG tablet TAKE 1 TABLET (40 MG TOTAL) BY MOUTH DAILY. 30 tablet 0   traZODone (DESYREL) 100 MG tablet TAKE 1 TABLET (100 MG TOTAL) BY MOUTH AT BEDTIME AS NEEDED FOR SLEEP. 30 tablet 0   No current facility-administered medications on file prior to visit.    Allergies:  Not on File  Physical Exam Today's Vitals  09/11/21 0939  BP: 136/84  Pulse: 76  Weight: 230 lb 3.2 oz (104.4 kg)  Height: '5\' 9"'  (1.753 m)   Body mass index is 33.99 kg/m.   General: Obese pleasant middle-aged African-American male seated, in no evident distress Head: head normocephalic and atraumatic.  Neck: supple with no carotid or supraclavicular bruits Cardiovascular: regular rate and rhythm, no murmurs  Neurologic Exam Mental Status: Awake and fully alert.  Fluent speech and language.  Oriented to place and time. Recent and remote memory intact. Attention span, concentration and fund of knowledge appropriate. Mood and affect appropriate.  Cranial Nerves: Pupils equal, briskly reactive to light. Extraocular movements full without nystagmus. Visual fields full to confrontation. Hearing intact. Facial sensation intact. Face, tongue, palate moves normally and symmetrically.  Motor: Normal bulk and tone. Normal strength in all tested extremity muscles except mild weakness of left grip and intrinsic hand muscles.  Diminished fine finger movements on the left.  Orbits right over left upper extremity.  Mild left hip flexor weakness.  Limited left shoulder ROM secondary to pain Sensory.: intact to touch ,pinprick .position and vibratory sensation.  Coordination: Rapid alternating movements normal in all extremities except left  hand. Finger-to-nose performed accurately RUE (difficulty performing LUE d/t shoulder pain) and heel-to-shin performed accurately bilaterally. Gait and Station: Arises from chair without difficulty. Stance is normal. Gait demonstrates normal stride length and balance without use of assistive device.  Reflexes: 1+ and symmetric. Toes downgoing.       ASSESSMENT/PLAN: 55 year old African-American male with right large basal ganglia infarct of cryptogenic etiology in January 2022.  30-day cardiac event monitor negative for atrial fibrillation.  Vascular risk factors of obesity, sleep apnea, diabetes, hypertension, hyperlipidemia and a small PFO.  Completed participation in sleep smart trial    R BG stroke, cryptogenic -Residual mild left hemiparesis -Continue aspirin 81 mg daily and atorvastatin 80 mg daily for secondary stroke prevention and  -HTN: BP goal<130/90.  Stable on amlodipine 10 mg daily and losartan 25 mg daily per PCP -HLD: LDL goal<70.  Recent LDL 77 down from 138 on atorvastatin 80 mg daily per PCP -DM: A1c goal<7.  Recent A1c 6.6.  On metformin per PCP -Discussed formal evaluation for sleep apnea as he has since completed sleep smart trial but declines interest at this time.  He will call if interested in pursuing in the future    Follow-up in 6 months or call earlier if needed    CC:  [facility] provider: Dr. Rhea Belton, Percell Miller, PA-C   I spent 32 minutes of face-to-face and non-face-to-face time with patient.  This included previsit chart review, lab review, study review, electronic health record documentation, patient education and discussion regarding history of prior stroke, residual deficits and possible further recovery, secondary stroke prevention measures and aggressive stroke risk factor management, importance of sleep apnea treatment and answered all other questions to patient satisfaction  Frann Rider, AGNP-BC  [facility] [address]  [place], [place] [address]  Phone [phone] Fax [phone] Note: This document was prepared with digital dictation and possible smart phrase technology. Any transcriptional errors that result from this process are unintentional.

## 2021-09-12 NOTE — Progress Notes (Signed)
I agree with the above plan 

## 2021-09-19 ENCOUNTER — Other Ambulatory Visit: Payer: Self-pay | Admitting: Orthopaedic Surgery

## 2021-09-20 ENCOUNTER — Other Ambulatory Visit: Payer: Medicaid Other

## 2021-09-26 ENCOUNTER — Ambulatory Visit: Payer: Medicaid Other | Admitting: Physical Therapy

## 2021-09-28 ENCOUNTER — Other Ambulatory Visit: Payer: Self-pay

## 2021-09-28 ENCOUNTER — Encounter: Payer: Self-pay | Admitting: Physical Therapy

## 2021-09-28 ENCOUNTER — Ambulatory Visit: Payer: Medicaid Other | Attending: Physical Medicine & Rehabilitation | Admitting: Physical Therapy

## 2021-09-28 VITALS — BP 138/84

## 2021-09-28 DIAGNOSIS — M6281 Muscle weakness (generalized): Secondary | ICD-10-CM | POA: Insufficient documentation

## 2021-09-28 DIAGNOSIS — R2681 Unsteadiness on feet: Secondary | ICD-10-CM | POA: Diagnosis not present

## 2021-09-28 DIAGNOSIS — I69354 Hemiplegia and hemiparesis following cerebral infarction affecting left non-dominant side: Secondary | ICD-10-CM | POA: Insufficient documentation

## 2021-09-28 DIAGNOSIS — R2689 Other abnormalities of gait and mobility: Secondary | ICD-10-CM | POA: Insufficient documentation

## 2021-09-28 NOTE — Therapy (Signed)
[facility] [address] [place], [place], [address] Phone: [phone]   Fax:  [phone]  Physical Therapy Treatment  Patient Details  Name: Aaron Gibson MRN: [id number] Date of Birth: 08-Feb-1966 Referring Provider (PT): Letta Pate Luanna Salk, MD   Encounter Date: 09/28/2021   PT End of Session - 09/28/21 1429     Visit Number 8    Number of Visits 15    Date for PT Re-Evaluation 10/22/21    Authorization Type Medicaid    Authorization - Visit Number 6    Authorization - Number of Visits 6    PT Start Time 1104    PT Stop Time 1145    PT Time Calculation (min) 41 min    Equipment Utilized During Treatment Gait belt    Activity Tolerance Patient tolerated treatment well    Behavior During Therapy [facility] for tasks assessed/performed             Past Medical History:  Diagnosis Date   Hypertension    untreated   Morbid obesity ([place])     Past Surgical History:  Procedure Laterality Date   BUBBLE STUDY  01/02/2021   Procedure: BUBBLE STUDY;  Surgeon: Freada Bergeron, MD;  Location: [facility];  Service: Cardiovascular;;   TEE WITHOUT CARDIOVERSION N/A 01/02/2021   Procedure: TRANSESOPHAGEAL ECHOCARDIOGRAM (TEE);  Surgeon: Freada Bergeron, MD;  Location: [facility] ENDOSCOPY;  Service: Cardiovascular;  Laterality: N/A;   UMBILICAL HERNIA REPAIR N/A 03/15/2015   Procedure: UMBILICAL HERNIA REPAIR WITH MESH;  Surgeon: Coralie Keens, MD;  Location: [facility];  Service: General;  Laterality: N/A;    Vitals:   09/28/21 1117  BP: 138/84     Subjective Assessment - 09/28/21 1109     Subjective Reports blood pressure has been decent. Reports has been trying to do some jumping at home and it really irritated both of his calves - did this last Tuesday. Getting an MRI soon for his L shoulder.    Patient is accompained by: Family member   sister   Pertinent History HTN, obesity, chronic back pain.    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 No/denies                               [facility] Adult PT Treatment/Exercise - 09/28/21 1119       Exercises   Exercises Other Exercises    Other Exercises  At countertop: calf stretch 2 x 30 seconds, forwards/backwards weigt shifting rocking at hips at holding posteriorly for a stretch for 10 seconds, x10 reps. at bottom of stairs - dropping heels off step for calf stretch 4 x 30 seconds. Pt prefers the one at the countertop and at the staircase, added to pt's HEP to help with calf stretching      Knee/Hip Exercises: Aerobic   Stepper With BLE only at gear 3.3 >3.0 for strengthening/ROM for 5 minutes                 Balance Exercises - 09/28/21 1143       Balance Exercises: Standing   Standing Eyes Closed Wide (BOA);Foam/compliant surface;Limitations    Standing Eyes Closed Limitations on blue foam beam, feet hip width to more narrow BOS 3 x 30 seconds    SLS Eyes  open;Solid surface    SLS Limitations standing on LLE - with RLE rolling soccer ball forwards and back x2 reps, performed x3 sets. verbal and tactile cues for L quad activation during stance    Tandem Gait Forward;Limitations;3 reps    Tandem Gait Limitations on blue foam beam forwards/backwards x3 reps with UE support as needed    Other Standing Exercises on blue foam beam with LLE as stance leg - bringing RLE forwards and back x5 reps                PT Education - 09/28/21 1429     Education Details Verbally added calf stretches to pt's HEP    Person(s) Educated Patient    Methods Explanation;Demonstration    Comprehension Returned demonstration;Verbalized understanding              PT Short Term Goals - 07/18/21 0947       PT SHORT TERM GOAL #1   Title Pt will be independent with initial HEP in order to build upon functional gains made in therapy.  ALL STGS DUE AFTER 4TH VISIT.    Baseline pt reports compliance with HEP.    Time 4   visits   Status Achieved    Target Date 07/06/21      PT SHORT TERM GOAL #2   Title Pt will undergo further assessment of L foot up brace to assist with foot clearance during gait when pt is more fatigued.    Baseline met    Time 4   VISITS   Status Achieved      PT SHORT TERM GOAL #3   Title Pt will decr 5x sit <> stand time to 13 seconds or less without UE support to demo improved functional BLE strength.    Baseline 15.68 seconds no UE support; 14.7 seconds on 07/18/21    Time 4   visits   Status Not Met      PT SHORT TERM GOAL #4   Title Perform 6MWT with LTG written to demo improved walking endurance for community distances.    Baseline 982' on 06/19/21 - LTG written    Time 4   visits   Status Achieved               PT Long Term Goals - 08/23/21 0913       PT LONG TERM GOAL #1   Title Pt will be independent with final HEP in order to build upon functional gains made in therapy. ALL LTGS DUE 10/22/21    Baseline will continue to benefit from additions/updates.    Time 8   or 15th visit   Period Weeks    Status On-going    Target Date 10/22/21      PT LONG TERM GOAL #2   Title Pt will improve 6MWT distance to at least 1,200' with fatigue rating of 2/10 or less order to demo improved walking endurance for community distances.    Baseline 928'; 1,102' with 4/10 fatigue rating on 08/22/21    Time 8    Period Weeks    Status Revised      PT LONG TERM GOAL #3   Title Pt will improve gait speed to at least 3.3 ft/sec to demo improved community mobility.    Baseline 2.71 ft/sec - 2.85 ft/sec on 07/25/21; 3.08 ft/sec on 08/22/21    Time 8    Period Weeks    Status Revised      PT LONG  TERM GOAL #4   Title Pt will improve FGA score to at least a 29/30 in order to demo decr fall risk.    Baseline 26/30 on 07/25/21; 27/30 on 08/22/21    Time 8    Period Weeks    Status Revised                    Plan - 09/28/21 1431     Clinical Impression Statement Pt returns today to PT after taking a break for a month. Pt reports he recently irritated his bilat calves due to trying to perform jumping mini squats at home and they tensed up. Beginning of session focused on aerobic warm up/ROM and then adding in calf stretches for pt to perform at home. REmainder of session focused on standing balance strategies with narrow BOS, vision removed, and SLS. Does need cues for L quad activation during LLE stance time.    Personal Factors and Comorbidities Comorbidity 3+;Profession;Past/Current Experience    Comorbidities HTN, obesity, chronic back pain.    Examination-Activity Limitations Locomotion Level;Squat    Examination-Participation Restrictions Occupation;Community Activity    Stability/Clinical Decision Making Stable/Uncomplicated    Rehab Potential Good    PT Frequency 2x / week   8 visits over 8 weeks   PT Duration 8 weeks   8 visits over 8 weeks   PT Treatment/Interventions ADLs/Self Care Home Management;Gait training;Therapeutic activities;Balance training;Stair training;Therapeutic exercise;Functional mobility training;Neuromuscular re-education;Orthotic Fit/Training;Patient/family education;Vestibular    PT Next Visit Plan how are calves feeling? check BP,  strength for L hip extensors, hip flexors and PF. try warmup on elliptical. balance on unlevel surfaces, vision removed. SLS on LLE    Consulted and Agree with Plan of Care Patient;Family member/caregiver             Patient will benefit from skilled therapeutic intervention in order to improve the following deficits and impairments:  Abnormal gait, Decreased activity tolerance, Decreased balance, Decreased coordination, Decreased endurance, Decreased strength  Visit Diagnosis: Muscle weakness (generalized)  Hemiplegia and hemiparesis following cerebral infarction affecting left non-dominant side  (HCC)  Unsteadiness on feet  Other abnormalities of gait and mobility     Problem List Patient Active Problem List   Diagnosis Date Noted   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   Cerebrovascular accident (CVA) of right basal ganglia ([place]) 01/02/2021   CVA (cerebral vascular accident) ([place]) 12/29/2020   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 [place], [place], [address] Phone: [phone]   Fax:  [phone]  Name: Aaron Gibson MRN: [id number] Date of Birth: [birth year]

## 2021-09-29 ENCOUNTER — Ambulatory Visit: Payer: Medicaid Other | Admitting: Medical

## 2021-09-29 ENCOUNTER — Encounter: Payer: Self-pay | Admitting: Medical

## 2021-09-29 VITALS — BP 133/80 | HR 83 | Temp 98.6°F | Ht 69.0 in | Wt 230.0 lb

## 2021-09-29 DIAGNOSIS — I10 Essential (primary) hypertension: Secondary | ICD-10-CM | POA: Diagnosis not present

## 2021-09-29 DIAGNOSIS — Z23 Encounter for immunization: Secondary | ICD-10-CM

## 2021-09-29 DIAGNOSIS — R5383 Other fatigue: Secondary | ICD-10-CM

## 2021-09-29 DIAGNOSIS — E785 Hyperlipidemia, unspecified: Secondary | ICD-10-CM

## 2021-09-29 DIAGNOSIS — R062 Wheezing: Secondary | ICD-10-CM

## 2021-09-29 DIAGNOSIS — Z8673 Personal history of transient ischemic attack (TIA), and cerebral infarction without residual deficits: Secondary | ICD-10-CM

## 2021-09-29 DIAGNOSIS — E118 Type 2 diabetes mellitus with unspecified complications: Secondary | ICD-10-CM | POA: Diagnosis not present

## 2021-09-29 DIAGNOSIS — E1169 Type 2 diabetes mellitus with other specified complication: Secondary | ICD-10-CM

## 2021-09-29 LAB — CBC WITH DIFFERENTIAL/PLATELET
Basophils Absolute: 0 10*3/uL (ref 0.0–0.1)
Basophils Relative: 0.2 % (ref 0.0–3.0)
Eosinophils Absolute: 0.1 10*3/uL (ref 0.0–0.7)
Eosinophils Relative: 1.1 % (ref 0.0–5.0)
HCT: 46.7 % (ref 39.0–52.0)
Hemoglobin: 15.4 g/dL (ref 13.0–17.0)
Lymphocytes Relative: 32.1 % (ref 12.0–46.0)
Lymphs Abs: 3 10*3/uL (ref 0.7–4.0)
MCHC: 33 g/dL (ref 30.0–36.0)
MCV: 88.9 fl (ref 78.0–100.0)
Monocytes Absolute: 0.9 10*3/uL (ref 0.1–1.0)
Monocytes Relative: 9.3 % (ref 3.0–12.0)
Neutro Abs: 5.3 10*3/uL (ref 1.4–7.7)
Neutrophils Relative %: 57.3 % (ref 43.0–77.0)
Platelets: 142 10*3/uL — ABNORMAL LOW (ref 150.0–400.0)
RBC: 5.26 Mil/uL (ref 4.22–5.81)
RDW: 14.3 % (ref 11.5–15.5)
WBC: 9.3 10*3/uL (ref 4.0–10.5)

## 2021-09-29 LAB — T4, FREE: Free T4: 0.66 ng/dL (ref 0.60–1.60)

## 2021-09-29 LAB — TSH: TSH: 1.14 u[IU]/mL (ref 0.35–5.50)

## 2021-09-29 LAB — VITAMIN B12: Vitamin B-12: 210 pg/mL — ABNORMAL LOW (ref 211–911)

## 2021-09-29 MED ORDER — LOSARTAN POTASSIUM 50 MG PO TABS: 50.0000 mg | ORAL_TABLET | Freq: Every day | ORAL | 1 refills | Status: DC

## 2021-09-29 MED ORDER — ALBUTEROL SULFATE HFA 108 (90 BASE) MCG/ACT IN AERS: 2.0000 | INHALATION_SPRAY | Freq: Four times a day (QID) | RESPIRATORY_TRACT | 0 refills | Status: AC | PRN

## 2021-09-29 NOTE — Progress Notes (Signed)
Subjective:    Patient ID: Aaron Gibson, male    DOB: Jan 08, 1966, 55 y.o.   MRN: [id number]  HPI  Pt in for follow up.   Pt last 2 bp reading in our office were in 130/80 range.   Pt machine at home seems to run little higher. He states about 8-12.   On last visit with me his machine gave higher reading than manual check with [place].  Hx of stroke. Recent follow up with neurologist.   "R BG stroke, cryptogenic -Residual mild left hemiparesis -Continue aspirin 81 mg daily and atorvastatin 80 mg daily for secondary stroke prevention and  -HTN: BP goal<130/90.  Stable on amlodipine 10 mg daily and losartan 25 mg daily per PCP -HLD: LDL goal<70.  Recent LDL 77 down from 138 on atorvastatin 80 mg daily per PCP -DM: A1c goal<7.  Recent A1c 6.6.  On metformin per PCP -Discussed formal evaluation for sleep apnea as he has since completed sleep smart trial but declines interest at this time.  He will call if interested in pursuing in the future"   Hx of smoker from 55 yo on and off. Estimate pack a day for 15 years total. Stopped smoking 55 yo. Since last years occasional wheezing.     Review of Systems  Constitutional:  Negative for chills, fatigue and fever.  Respiratory:  Negative for cough, chest tightness, shortness of breath and stridor.   Cardiovascular:  Negative for chest pain and palpitations.  Gastrointestinal:  Negative for abdominal pain, blood in stool, constipation, diarrhea and nausea.  Musculoskeletal:  Negative for back pain, joint swelling, myalgias and neck pain.  Skin:  Negative for rash.  Neurological:  Negative for dizziness, seizures, syncope, speech difficulty, weakness, light-headedness, numbness and headaches.  Psychiatric/Behavioral:  Negative for behavioral problems, decreased concentration and dysphoric mood.    Past Medical History:  Diagnosis Date   Hypertension    untreated   Morbid obesity ([place])      Social History   Socioeconomic History    Marital status: Single    Spouse name: Not on file   Number of children: Not on file   Years of education: Not on file   Highest education level: Not on file  Occupational History   Occupation: Unemployed   Tobacco Use   Smoking status: Former    Packs/day: 1.00    Years: 29.00    Pack years: 29.00    Types: Cigarettes   Smokeless tobacco: Never   Tobacco comments:    quit 12/28/2020  Vaping Use   Vaping Use: Never used  Substance and Sexual Activity   Alcohol use: Yes    Comment: occasionally holidays   Drug use: No   Sexual activity: Yes  Other Topics Concern   Not on file  Social History Narrative   Lives with daughter   Right Handed   Caffeine 2x week   Social Determinants of Health   Financial Resource Strain: Not on file  Food Insecurity: Not on file  Transportation Needs: Not on file  Physical Activity: Not on file  Stress: Not on file  Social Connections: Not on file  Intimate Partner Violence: Not on file    Past Surgical History:  Procedure Laterality Date   BUBBLE STUDY  01/02/2021   Procedure: BUBBLE STUDY;  Surgeon: Freada Bergeron, MD;  Location: [facility];  Service: Cardiovascular;;   TEE WITHOUT CARDIOVERSION N/A 01/02/2021   Procedure: TRANSESOPHAGEAL ECHOCARDIOGRAM (TEE);  Surgeon: Freada Bergeron, MD;  Location: [facility];  Service: Cardiovascular;  Laterality: N/A;   UMBILICAL HERNIA REPAIR N/A 03/15/2015   Procedure: UMBILICAL HERNIA REPAIR WITH MESH;  Surgeon: Coralie Keens, MD;  Location: [facility];  Service: General;  Laterality: N/A;    No family history on file.  Not on File  Current Outpatient Medications on File Prior to Visit  Medication Sig Dispense Refill   Accu-Chek Softclix Lancets lancets Use to check Blood sugars once a day. Dx E11.8 100 each 1   acetaminophen (TYLENOL) 325 MG tablet Take 2 tablets (650 mg total) by mouth every 4 (four) hours as needed for mild pain (or temp > 37.5 C (99.5 F)).     amLODipine  (NORVASC) 10 MG tablet TAKE 1 TABLET(10 MG) BY MOUTH DAILY 90 tablet 0   aspirin 81 MG chewable tablet Chew 1 tablet (81 mg total) by mouth daily.     atorvastatin (LIPITOR) 80 MG tablet TAKE 1 TABLET(80 MG) BY MOUTH DAILY 30 tablet 5   Blood Glucose Monitoring Suppl (ACCU-CHEK GUIDE ME) w/Device KIT Use to check Blood sugars once a day. Dx E11.8 1 kit 0   buPROPion (WELLBUTRIN XL) 150 MG 24 hr tablet Take 1 tablet (150 mg total) by mouth daily. 30 tablet 3   clopidogrel (PLAVIX) 75 MG tablet TAKE 1 TABLET (75 MG TOTAL) BY MOUTH DAILY. 13 tablet 0   glucose blood (ACCU-CHEK GUIDE) test strip Use to check Blood sugars once a day. Dx E11.8 100 each 1   lidocaine (LIDODERM) 5 % PLACE 1 PATCH ONTO THE SKIN DAILY. REMOVE & DISCARD PATCH WITHIN 12 HOURS OR AS DIRECTED BY MD 30 patch 0   metFORMIN (GLUCOPHAGE) 500 MG tablet Take 1 tablet (500 mg total) by mouth 2 (two) times daily with a meal. 60 tablet 3   methocarbamol (ROBAXIN) 500 MG tablet TAKE 1 TABLET (500 MG TOTAL) BY MOUTH EVERY EIGHT HOURS. 60 tablet 0   nicotine (NICODERM CQ - DOSED IN MG/24 HR) 7 mg/24hr patch APPLY 1 PATCH (7MG) TAKE ONTO SKIN DAILY X3 WEEKS THEN STOP 28 patch 0   pantoprazole (PROTONIX) 40 MG tablet TAKE 1 TABLET (40 MG TOTAL) BY MOUTH DAILY. 30 tablet 0   traZODone (DESYREL) 100 MG tablet TAKE 1 TABLET (100 MG TOTAL) BY MOUTH AT BEDTIME AS NEEDED FOR SLEEP. 30 tablet 0   No current facility-administered medications on file prior to visit.    BP 133/80   Pulse 83   Temp 98.6 F (37 C)   Ht '5\' 9"'  (1.753 m)   Wt 230 lb (104.3 kg)   SpO2 99%   BMI 33.97 kg/m       Objective:   Physical Exam  General Mental Status- Alert. General Appearance- Not in acute distress.   Skin General: Color- Normal Color. Moisture- Normal Moisture.  Neck Carotid Arteries- Normal color. Moisture- Normal Moisture. No carotid bruits. No JVD.  Chest and Lung Exam Auscultation: Breath  Sounds:-Normal.  Cardiovascular Auscultation:Rythm- Regular. Murmurs & Other Heart Sounds:Auscultation of the heart reveals- No Murmurs.  Abdomen Inspection:-Inspeection Normal. Palpation/Percussion:Note:No mass. Palpation and Percussion of the abdomen reveal- Non Tender, Non Distended + BS, no rebound or guarding.    Neurologic Cranial Nerve exam:- CN III-XII intact(No nystagmus), symmetric smile. .      Assessment & Plan:   Patient Instructions  History of hypertension.  Blood pressure is fairly well controlled presently.  When I last checked BP level was 132/80.  Neurologist thought your blood pressure go to be  less than 130/80.  With your blood pressure machine at home you do report some moderate high levels.  We both question accuracy of your machine.  Increase losartan to 50 mg daily and continue amlodipine 10 mg daily.  History of diabetes.  A1c 1 month ago was 6.6.  Continue low sugar diet and metformin.  History of hyperlipidemia and stroke.  Continue atorvastatin and aspirin daily.  For wheezing intermittently with history of smoking prescribed refill of albuterol.  For fatigue describe, I placed order for CBC, TSH, T4, B1, B12 and vitamin D level.   Follow-up date to be determined after lab review.  With your medical history typically recommend 52-monthfollow-ups.

## 2021-09-29 NOTE — Patient Instructions (Signed)
History of hypertension.  Blood pressure is fairly well controlled presently.  When I last checked BP level was 132/80.  Neurologist thought your blood pressure go to be less than 130/80.  With your blood pressure machine at home you do report some moderate high levels.  We both question accuracy of your machine.  Increase losartan to 50 mg daily and continue amlodipine 10 mg daily.  History of diabetes.  A1c 1 month ago was 6.6.  Continue low sugar diet and metformin.  History of hyperlipidemia and stroke.  Continue atorvastatin and aspirin daily.  For wheezing intermittently with history of smoking prescribed refill of albuterol.  For fatigue describe, I placed order for CBC, TSH, T4, B1, B12 and vitamin D level.   Follow-up date to be determined after lab review.  With your medical history typically recommend 72-month follow-ups.

## 2021-10-03 ENCOUNTER — Ambulatory Visit: Payer: Medicaid Other | Admitting: Physical Therapy

## 2021-10-03 LAB — VITAMIN D 1,25 DIHYDROXY
Vitamin D 1, 25 (OH)2 Total: 34 pg/mL (ref 18–72)
Vitamin D2 1, 25 (OH)2: <8 pg/mL
Vitamin D3 1, 25 (OH)2: 34 pg/mL

## 2021-10-03 LAB — VITAMIN B1: Vitamin B1 (Thiamine): 7 nmol/L — ABNORMAL LOW (ref 8–30)

## 2021-10-04 ENCOUNTER — Ambulatory Visit: Payer: Medicaid Other | Admitting: Physical Therapy

## 2021-10-04 ENCOUNTER — Encounter: Payer: Self-pay | Admitting: Physical Therapy

## 2021-10-04 ENCOUNTER — Other Ambulatory Visit: Payer: Self-pay

## 2021-10-04 ENCOUNTER — Ambulatory Visit
Admission: RE | Admit: 2021-10-04 | Discharge: 2021-10-04 | Disposition: A | Discharge status: Home / Self Care | Payer: Medicaid Other | Source: Ambulatory Visit | Attending: Physician Assistant | Admitting: Physician Assistant

## 2021-10-04 DIAGNOSIS — M6281 Muscle weakness (generalized): Secondary | ICD-10-CM | POA: Diagnosis not present

## 2021-10-04 DIAGNOSIS — R2689 Other abnormalities of gait and mobility: Secondary | ICD-10-CM | POA: Diagnosis not present

## 2021-10-04 DIAGNOSIS — G8929 Other chronic pain: Secondary | ICD-10-CM

## 2021-10-04 DIAGNOSIS — M19012 Primary osteoarthritis, left shoulder: Secondary | ICD-10-CM | POA: Diagnosis not present

## 2021-10-04 DIAGNOSIS — M65812 Other synovitis and tenosynovitis, left shoulder: Secondary | ICD-10-CM | POA: Diagnosis not present

## 2021-10-04 DIAGNOSIS — I69354 Hemiplegia and hemiparesis following cerebral infarction affecting left non-dominant side: Secondary | ICD-10-CM | POA: Diagnosis not present

## 2021-10-04 DIAGNOSIS — R2681 Unsteadiness on feet: Secondary | ICD-10-CM | POA: Diagnosis not present

## 2021-10-04 DIAGNOSIS — R531 Weakness: Secondary | ICD-10-CM | POA: Diagnosis not present

## 2021-10-04 DIAGNOSIS — M7522 Bicipital tendinitis, left shoulder: Secondary | ICD-10-CM | POA: Diagnosis not present

## 2021-10-04 IMAGING — MR MR SHOULDER*L* W/O CM
4 of 5 series · 21 of 40 positions shown · non-contrast
Comparison: X-ray shoulder [DATE].

CLINICAL DATA: Patient complains of chronic left shoulder pain and
weakness with limited range of motion x 8 months. Patient denies
history of therapeutic injections or surgeries. No known injury.

EXAM:
MRI OF THE LEFT SHOULDER WITHOUT CONTRAST
TECHNIQUE: Multiplanar, multisequence MR imaging of the shoulder was performed.
No intravenous contrast was administered.

[Series 6: T2 fat-sat · axial · left · 3.0mm · 0.62mm/px · z∈[-98,+15]mm · 8 of 31 slices shown (1 of 3)]
[im 1/31]
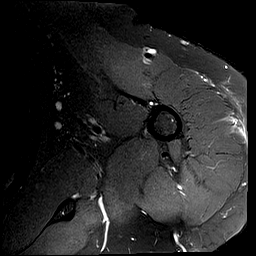
[im 4/31]
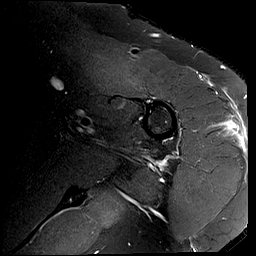
[im 11/31]
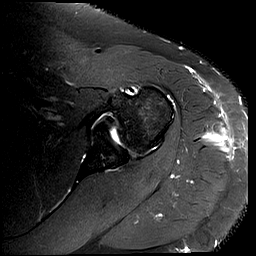
[im 14/31]
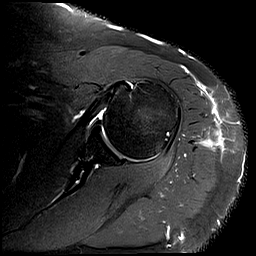
[im 17/31]
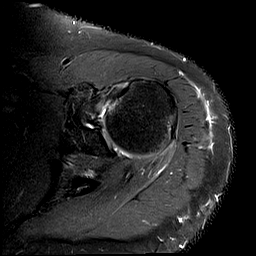
[im 21/31]
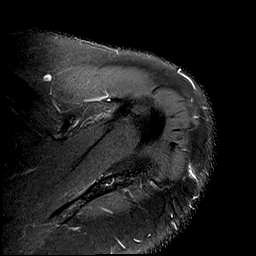
[im 27/31]
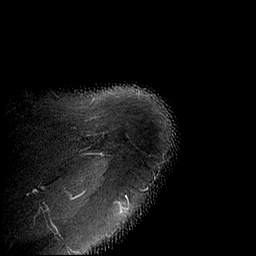
[im 31/31]
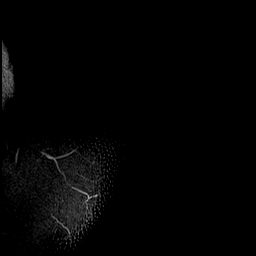

[Series 7: T2 fat-sat · oblique · left · 4.0mm · 0.25mm/px · 3 of 25 slices shown (2 of 3)]
[im 5/25]
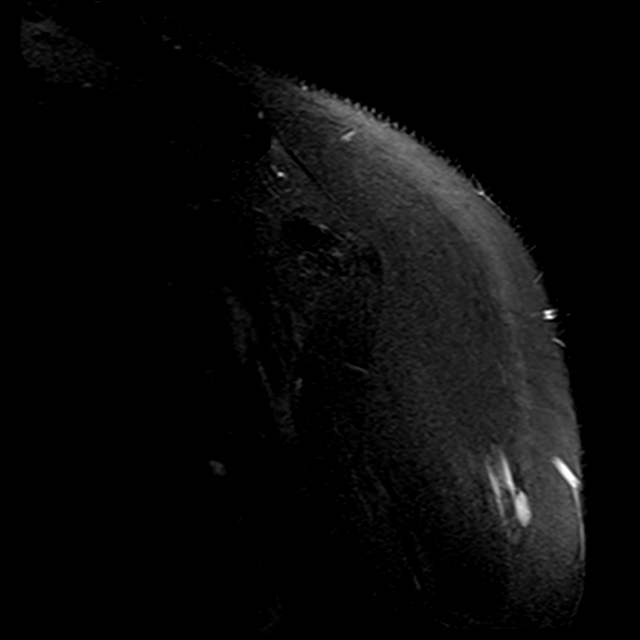
[im 13/25]
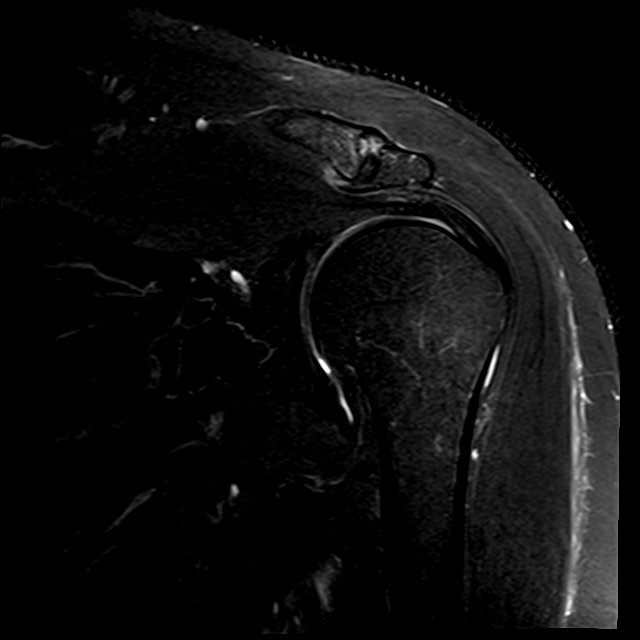
[im 21/25]
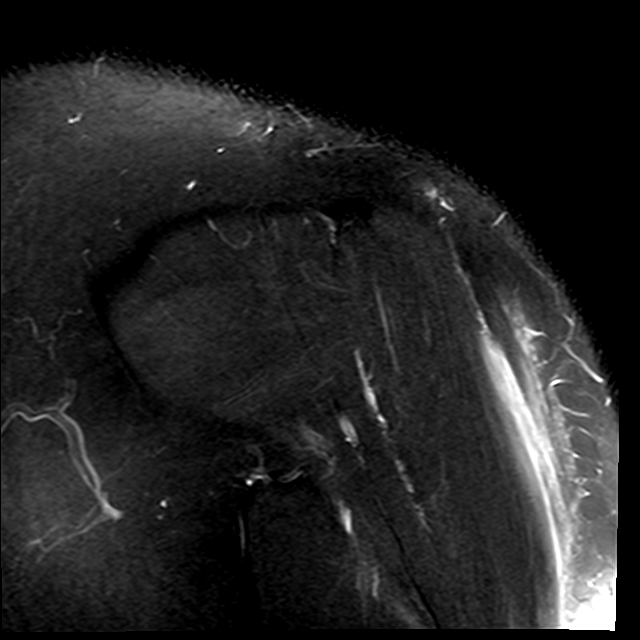

[Series 8: PD · oblique · left · 4.0mm · 0.25mm/px · 7 of 25 slices shown]
[im 1/25]
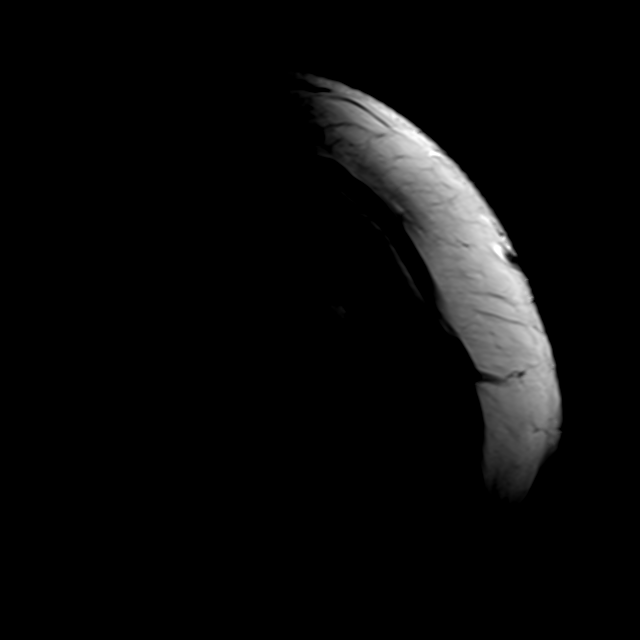
[im 5/25]
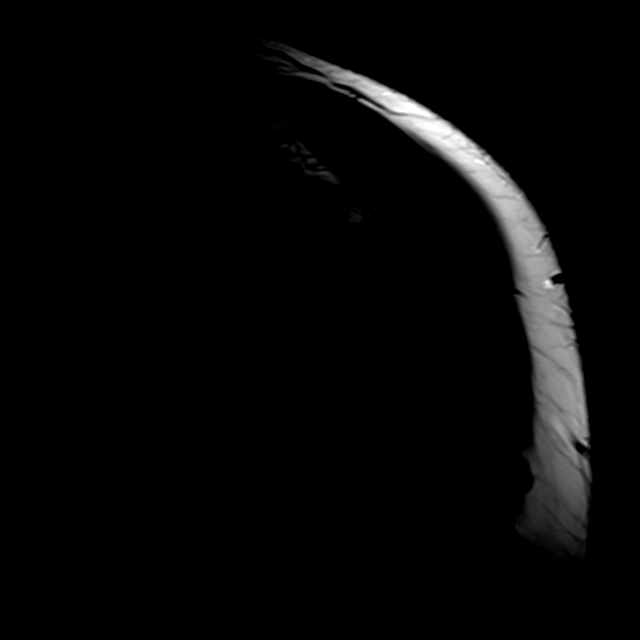
[im 9/25]
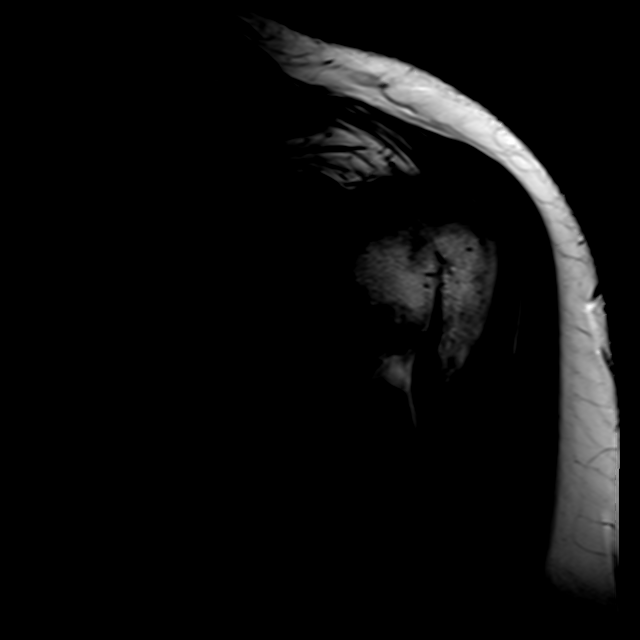
[im 13/25]
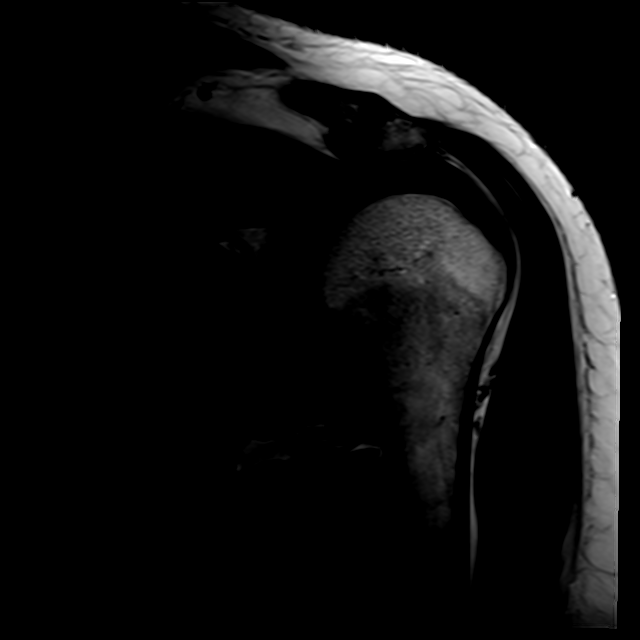
[im 17/25]
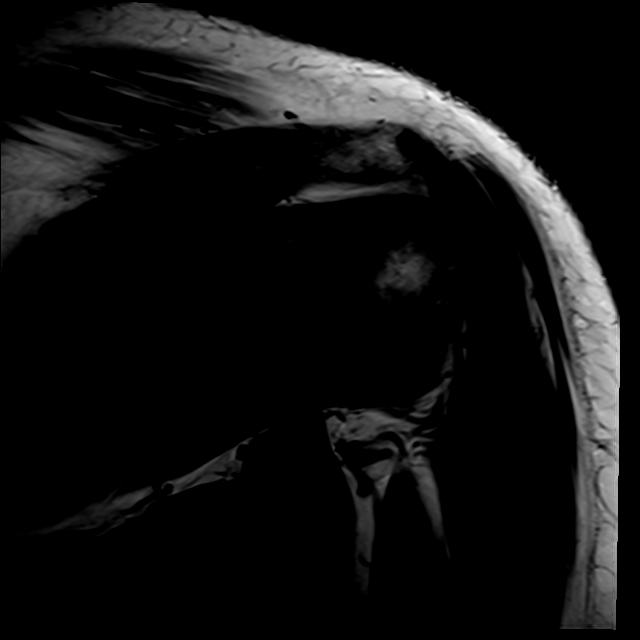
[im 21/25]
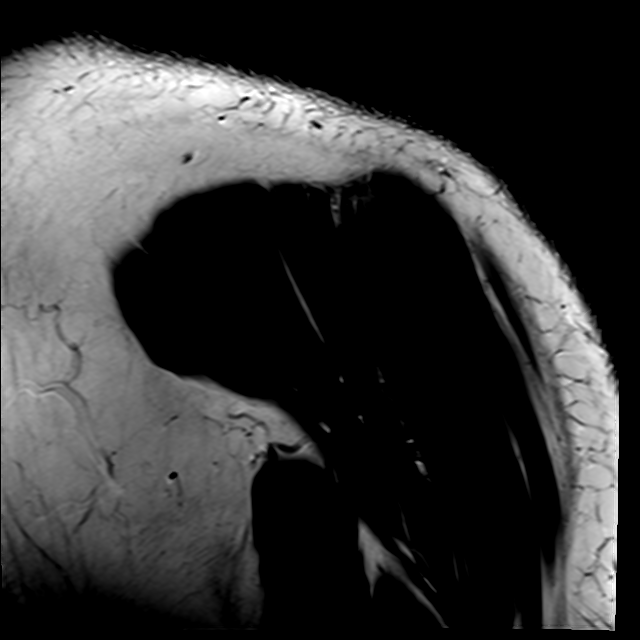
[im 25/25]
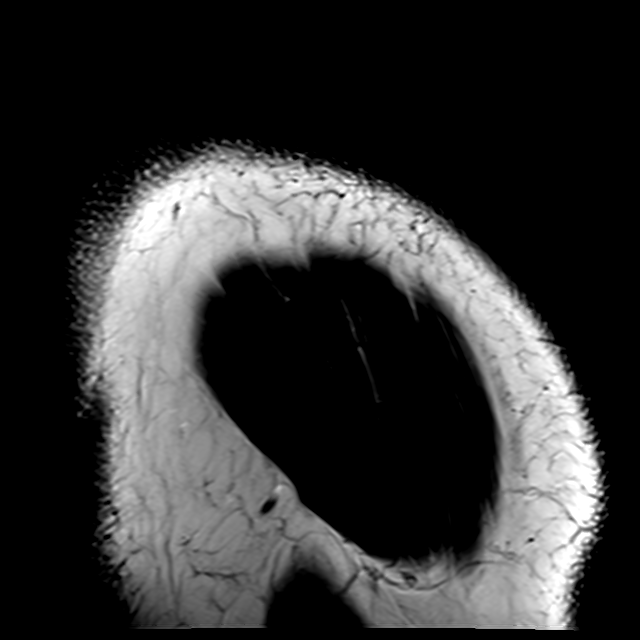

[Series 9: T2 fat-sat · oblique · left · 4.0mm · 0.62mm/px · 3 of 29 slices shown (3 of 3)]
[im 5/29]
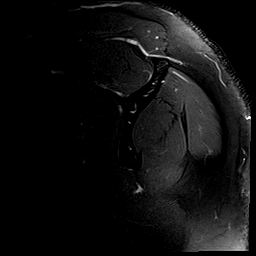
[im 17/29]
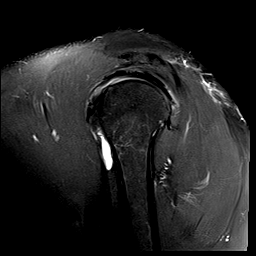
[im 25/29]
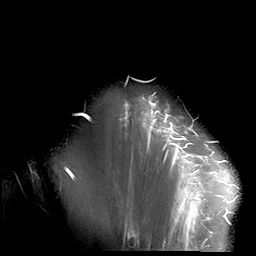

[21 of 40 positions shown; findings below may reference images not displayed]

FINDINGS: Rotator cuff: Mild supraspinatus tendinosis with low-grade fraying
along the anterior to mid tendon insertion (series 7, images 15-17)
without a full-thickness or retracted tear. Mild distal
subscapularis tendinosis. Infraspinatus and teres minor tendons are
within normal limits.

Muscles: Preserved bulk and signal intensity of the rotator cuff
musculature without edema, atrophy, or fatty infiltration.

Biceps long head: Moderate intra-articular biceps tendinosis and
trace tenosynovitis.

Acromioclavicular Joint: Mild arthropathy of the AC joint. No
subacromial-subdeltoid bursal fluid.

Glenohumeral Joint: Mild chondral surface irregularity along the
posterior glenoid and opposing humeral head. No focal cartilage
defect. No joint effusion.

Labrum: Posterosuperior labrum appears degenerated. No well-defined
tear on non-arthrographic imaging. No paralabral cyst.

Bones:  No marrow abnormality, fracture or dislocation.

Other: Focal intramuscular edema involving the superficial aspect of
the lateral deltoid muscle (series 6, image 23). Mild subcutaneous
edema within the overlying soft tissues.
IMPRESSION: 1. Mild supraspinatus tendinosis with low-grade fraying along the
anterior to mid tendon insertion. No full-thickness or retracted
tear.
2. Mild distal subscapularis tendinosis.
3. Moderate intra-articular biceps tendinosis and trace
tenosynovitis.
4. Focal intramuscular edema involving the superficial aspect of the
lateral deltoid muscle. Findings may reflect a muscle strain.
Injection-related changes could also have this appearance. Correlate
for history of recent vaccination.
5. Mild acromioclavicular and glenohumeral osteoarthritis.

## 2021-10-04 NOTE — Therapy (Addendum)
[facility] [address] [place], [place], [address] Phone: [phone]   Fax:  [phone]  Physical Therapy Treatment  Patient Details  Name: Aaron Gibson MRN: [id number] Date of Birth: Dec 17, 1966 Referring Provider (PT): Letta Pate Luanna Salk, MD   Encounter Date: 10/04/2021   PT End of Session - 10/04/21 1234     Visit Number 9    Number of Visits 15    Date for PT Re-Evaluation 10/22/21    Authorization Type Medicaid    Authorization - Visit Number 6    Authorization - Number of Visits 6    PT Start Time 1230    PT Stop Time 1312    PT Time Calculation (min) 42 min    Equipment Utilized During Treatment Gait belt    Activity Tolerance Patient tolerated treatment well    Behavior During Therapy [facility] for tasks assessed/performed             Past Medical History:  Diagnosis Date   Hypertension    untreated   Morbid obesity ([place])     Past Surgical History:  Procedure Laterality Date   BUBBLE STUDY  01/02/2021   Procedure: BUBBLE STUDY;  Surgeon: Freada Bergeron, MD;  Location: [facility];  Service: Cardiovascular;;   TEE WITHOUT CARDIOVERSION N/A 01/02/2021   Procedure: TRANSESOPHAGEAL ECHOCARDIOGRAM (TEE);  Surgeon: Freada Bergeron, MD;  Location: [facility];  Service: Cardiovascular;  Laterality: N/A;   UMBILICAL HERNIA REPAIR N/A 03/15/2015   Procedure: UMBILICAL HERNIA REPAIR WITH MESH;  Surgeon: Coralie Keens, MD;  Location: [facility];  Service: General;  Laterality: N/A;    There were no vitals filed for this visit.   Subjective Assessment - 10/04/21 1232     Subjective Got the MRI of his shoulder today - will have the results by Friday. Calf is feeling much better and looser today.    Patient is accompained by: Family member   sister   Pertinent History HTN, obesity, chronic back pain.    Limitations Walking    Patient Stated Goals wants to improve his mobility to go back to work  - is an Engineer, technical sales    Currently in Pain? Yes    Pain Score 4     Pain Location Shoulder    Pain Orientation Left    Pain Descriptors / Indicators Aching    Pain Type Chronic pain                               [facility] Adult PT Treatment/Exercise - 10/04/21 1238       Therapeutic Activites    Therapeutic Activities Other Therapeutic Activities    Other Therapeutic Activities Pt mentioning joining the [facility] and has met with a personal trainer regarding using equipment at the [facility] vs. lifting weights. Discussed use of seated scifit stepper for strengthening, leg press, and other seated leg strengthening that personal trainer can help pt get set up for appropriate weight/technique      Knee/Hip Exercises: Machines for Strengthening   Cybex Leg Press with BLE 60# - 2 x 10 reps, cues for slowed and controlled, with LLE only 30# 2 x 10 reps      Knee/Hip Exercises: Standing   Hip Flexion AROM;Stengthening;Limitations    Hip Flexion Limitations With 2# around ankles - stepping over 4 larger orange obstacles for SLS/hip flexion, down and back x3 reps    Hip Abduction Stengthening;Both;2  sets;10 reps    Abduction Limitations with 3# ankle weight, cues for technique, also performed wiht RLE for closed chain strengthening on LLE    Forward Step Up Both;1 set;10 reps;Hand Hold: 0;Hand Hold: 1;Step Height: 6"    Forward Step Up Limitations Alternating step ups x5 reps,  x10 reps each leg and marching up contralateral leg for SLS/hip flexion without UE support    Functional Squat 1 set;10 reps;Limitations    Functional Squat Limitations from elevated mat table - modified single leg squat with LLE, RLE extended with heel on ground                     PT Education - 10/04/21 1313     Education Details See therapeutic activity    Person(s) Educated Patient    Methods Explanation;Demonstration;Verbal cues    Comprehension Verbalized understanding;Returned  demonstration              PT Short Term Goals - 07/18/21 0947       PT SHORT TERM GOAL #1   Title Pt will be independent with initial HEP in order to build upon functional gains made in therapy. ALL STGS DUE AFTER 4TH VISIT.    Baseline pt reports compliance with HEP.    Time 4   visits   Status Achieved    Target Date 07/06/21      PT SHORT TERM GOAL #2   Title Pt will undergo further assessment of L foot up brace to assist with foot clearance during gait when pt is more fatigued.    Baseline met    Time 4   VISITS   Status Achieved      PT SHORT TERM GOAL #3   Title Pt will decr 5x sit <> stand time to 13 seconds or less without UE support to demo improved functional BLE strength.    Baseline 15.68 seconds no UE support; 14.7 seconds on 07/18/21    Time 4   visits   Status Not Met      PT SHORT TERM GOAL #4   Title Perform 6MWT with LTG written to demo improved walking endurance for community distances.    Baseline 982' on 06/19/21 - LTG written    Time 4   visits   Status Achieved               PT Long Term Goals - 08/23/21 0913       PT LONG TERM GOAL #1   Title Pt will be independent with final HEP in order to build upon functional gains made in therapy. ALL LTGS DUE 10/22/21    Baseline will continue to benefit from additions/updates.    Time 8   or 15th visit   Period Weeks    Status On-going    Target Date 10/22/21      PT LONG TERM GOAL #2   Title Pt will improve 6MWT distance to at least 1,200' with fatigue rating of 2/10 or less order to demo improved walking endurance for community distances.    Baseline 928'; 1,102' with 4/10 fatigue rating on 08/22/21    Time 8    Period Weeks    Status Revised      PT LONG TERM GOAL #3   Title Pt will improve gait speed to at least 3.3 ft/sec to demo improved community mobility.    Baseline 2.71 ft/sec - 2.85 ft/sec on 07/25/21; 3.08 ft/sec on 08/22/21    Time  8    Period Weeks    Status Revised      PT  LONG TERM GOAL #4   Title Pt will improve FGA score to at least a 29/30 in order to demo decr fall risk.    Baseline 26/30 on 07/25/21; 27/30 on 08/22/21    Time 8    Period Weeks    Status Revised                   Plan - 10/04/21 1317     Clinical Impression Statement Today's skilled session focused on BLE strengthening (LLE>RLE). Pt recently joined the [facility] and is planning on using seated scifit/bike and use of leg press/seated machines for leg strengthening. Discussed pt performing staggered stance sit <> stands throughout the day with LLE posteriorly to add some functional strengthening throughout the day (as pt notes that his RLE tends to be posteriorly). Pt tolerated session well, will continue to progress towrads LTGs.    Personal Factors and Comorbidities Comorbidity 3+;Profession;Past/Current Experience    Comorbidities HTN, obesity, chronic back pain.    Examination-Activity Limitations Locomotion Level;Squat    Examination-Participation Restrictions Occupation;Community Activity    Stability/Clinical Decision Making Stable/Uncomplicated    Rehab Potential Good    PT Frequency 2x / week   8 visits over 8 weeks   PT Duration 8 weeks   8 visits over 8 weeks   PT Treatment/Interventions ADLs/Self Care Home Management;Gait training;Therapeutic activities;Balance training;Stair training;Therapeutic exercise;Functional mobility training;Neuromuscular re-education;Orthotic Fit/Training;Patient/family education;Vestibular    PT Next Visit Plan monitor BP. try resisted gait/side stepping. strength for L hip extensors, hip flexors and PF. try warmup on elliptical/SCifit. balance on unlevel surfaces, vision removed. SLS on LLE    Consulted and Agree with Plan of Care Patient;Family member/caregiver             Patient will benefit from skilled therapeutic intervention in order to improve the following deficits and impairments:  Abnormal gait, Decreased activity tolerance,  Decreased balance, Decreased coordination, Decreased endurance, Decreased strength  Visit Diagnosis: Muscle weakness (generalized)  Unsteadiness on feet  Other abnormalities of gait and mobility  Hemiplegia and hemiparesis following cerebral infarction affecting left non-dominant side [facility])     Problem List Patient Active Problem List   Diagnosis Date Noted   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

## 2021-10-05 ENCOUNTER — Ambulatory Visit (INDEPENDENT_AMBULATORY_CARE_PROVIDER_SITE_OTHER): Payer: Medicaid Other | Admitting: *Deleted

## 2021-10-05 DIAGNOSIS — E538 Deficiency of other specified B group vitamins: Secondary | ICD-10-CM | POA: Diagnosis not present

## 2021-10-05 MED ORDER — CYANOCOBALAMIN 1000 MCG/ML IJ SOLN
1000.0000 ug | Freq: Once | INTRAMUSCULAR | Status: AC
  Administered 2021-10-05: 1000 ug via INTRAMUSCULAR

## 2021-10-05 NOTE — Progress Notes (Addendum)
Patient here for first weekly b12 injection per Edward.   He is to get 4 weekly b12 injections for 4 weeks, then monthly.  He is to recheck b12 in 6 months (around April).   Injection given in left deltoid and patient tolerated well.   Appointment made for next couple of weekly b12.   Edward Saguier, PA-C  

## 2021-10-06 ENCOUNTER — Ambulatory Visit: Payer: Medicaid Other

## 2021-10-09 NOTE — Progress Notes (Signed)
F/u to discuss

## 2021-10-10 ENCOUNTER — Ambulatory Visit: Payer: Medicaid Other | Admitting: Physical Therapy

## 2021-10-10 ENCOUNTER — Encounter: Payer: Self-pay | Admitting: Physical Therapy

## 2021-10-10 ENCOUNTER — Other Ambulatory Visit: Payer: Self-pay

## 2021-10-10 DIAGNOSIS — R2689 Other abnormalities of gait and mobility: Secondary | ICD-10-CM

## 2021-10-10 DIAGNOSIS — R2681 Unsteadiness on feet: Secondary | ICD-10-CM

## 2021-10-10 DIAGNOSIS — M6281 Muscle weakness (generalized): Secondary | ICD-10-CM

## 2021-10-10 DIAGNOSIS — I69354 Hemiplegia and hemiparesis following cerebral infarction affecting left non-dominant side: Secondary | ICD-10-CM | POA: Diagnosis not present

## 2021-10-10 NOTE — Therapy (Signed)
[facility] [address] [place], [place], [address] Phone: [phone]   Fax:  [phone]  Physical Therapy Treatment  Patient Details  Name: Aaron Gibson MRN: [id number] Date of Birth: 15-Apr-1966 Referring Provider (PT): Letta Pate Luanna Salk, MD   Encounter Date: 10/10/2021   PT End of Session - 10/10/21 1242     Visit Number 10    Number of Visits 15    Date for PT Re-Evaluation 10/22/21    Authorization Type Medicaid    Authorization - Visit Number 1    Authorization - Number of Visits 8   10/4-12/4 approved for 8   PT Start Time 1102    PT Stop Time 1145    PT Time Calculation (min) 43 min    Equipment Utilized During Treatment Gait belt    Activity Tolerance Patient tolerated treatment well    Behavior During Therapy [facility] for tasks assessed/performed             Past Medical History:  Diagnosis Date   Hypertension    untreated   Morbid obesity ([place])     Past Surgical History:  Procedure Laterality Date   BUBBLE STUDY  01/02/2021   Procedure: BUBBLE STUDY;  Surgeon: Freada Bergeron, MD;  Location: [place];  Service: Cardiovascular;;   TEE WITHOUT CARDIOVERSION N/A 01/02/2021   Procedure: TRANSESOPHAGEAL ECHOCARDIOGRAM (TEE);  Surgeon: Freada Bergeron, MD;  Location: [facility] ENDOSCOPY;  Service: Cardiovascular;  Laterality: N/A;   UMBILICAL HERNIA REPAIR N/A 03/15/2015   Procedure: UMBILICAL HERNIA REPAIR WITH MESH;  Surgeon: Coralie Keens, MD;  Location: [place];  Service: General;  Laterality: N/A;    There were no vitals filed for this visit.   Subjective Assessment - 10/10/21 1108     Subjective Results of MRI in my chart but pt does not use my chart.    Patient is accompained by: Family member   sister   Pertinent History HTN, obesity, chronic back pain.    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 Yes    Pain Score 4     Pain Location Shoulder    Pain Orientation Left    Pain Descriptors / Indicators Aching    Pain Type Chronic pain    Pain Onset More than a month ago    Pain Frequency Intermittent    Aggravating Factors  high reaching or back    Pain Relieving Factors resting, not raising    Multiple Pain Sites No                               [facility] Adult PT Treatment/Exercise - 10/10/21 0001       Transfers   Transfers Sit to Stand;Stand to Sit    Sit to Stand 5: Supervision    Stand to Sit 5: Supervision      Ambulation/Gait   Ambulation/Gait Yes    Ambulation/Gait Assistance 5: Supervision    Ambulation Distance (Feet) --   in/out of clinic and between activities   Assistive device None    Gait Pattern Step-through pattern;Decreased dorsiflexion - left;Decreased weight shift to left;Left foot flat;Decreased arm swing - left      High Level Balance   High Level Balance Activities Other (comment)    High Level Balance Comments Taps  to 6" step without UE assist alternating LE's x 10 reps, taps to 6", 12", 6", floor x 10 reps with same.  Tapping cones, double taps of cones and tipping/uprighting cones.  Pt with LOB at times with SLS on L but able to recover without assist.  Pt reports decreased control/coordination on the L side in certain ranges.      Exercises   Exercises Knee/Hip      Knee/Hip Exercises: Aerobic   Elliptical Level 1.5 2 minutes forward and 1 minute backward working on L LE control/coordination.  Pt using bil UE support.  Reports fatigue with Elliptical.    Other Aerobic Scifit level 2.0 LE's only x 8 minutes with >70 steps per minute      Knee/Hip Exercises: Machines for Strengthening   Cybex Leg Press BLE 60# x 10 reps x 2 sets;LLE only 35# x 10 reps x 2 sets                       PT Short Term Goals - 07/18/21 0947       PT SHORT TERM GOAL #1   Title Pt will be independent with initial  HEP in order to build upon functional gains made in therapy. ALL STGS DUE AFTER 4TH VISIT.    Baseline pt reports compliance with HEP.    Time 4   visits   Status Achieved    Target Date 07/06/21      PT SHORT TERM GOAL #2   Title Pt will undergo further assessment of L foot up brace to assist with foot clearance during gait when pt is more fatigued.    Baseline met    Time 4   VISITS   Status Achieved      PT SHORT TERM GOAL #3   Title Pt will decr 5x sit <> stand time to 13 seconds or less without UE support to demo improved functional BLE strength.    Baseline 15.68 seconds no UE support; 14.7 seconds on 07/18/21    Time 4   visits   Status Not Met      PT SHORT TERM GOAL #4   Title Perform 6MWT with LTG written to demo improved walking endurance for community distances.    Baseline 982' on 06/19/21 - LTG written    Time 4   visits   Status Achieved               PT Long Term Goals - 08/23/21 0913       PT LONG TERM GOAL #1   Title Pt will be independent with final HEP in order to build upon functional gains made in therapy. ALL LTGS DUE 10/22/21    Baseline will continue to benefit from additions/updates.    Time 8   or 15th visit   Period Weeks    Status On-going    Target Date 10/22/21      PT LONG TERM GOAL #2   Title Pt will improve 6MWT distance to at least 1,200' with fatigue rating of 2/10 or less order to demo improved walking endurance for community distances.    Baseline 928'; 1,102' with 4/10 fatigue rating on 08/22/21    Time 8    Period Weeks    Status Revised      PT LONG TERM GOAL #3   Title Pt will improve gait speed to at least 3.3 ft/sec to demo improved community mobility.    Baseline 2.71 ft/sec -  2.85 ft/sec on 07/25/21; 3.08 ft/sec on 08/22/21    Time 8    Period Weeks    Status Revised      PT LONG TERM GOAL #4   Title Pt will improve FGA score to at least a 29/30 in order to demo decr fall risk.    Baseline 26/30 on 07/25/21; 27/30 on  08/22/21    Time 8    Period Weeks    Status Revised                   Plan - 10/10/21 1242     Clinical Impression Statement Skilled session continues to focus on bil LE strengthening and coordination along with working in various ROM's.  Cont per poc.    Personal Factors and Comorbidities Comorbidity 3+;Profession;Past/Current Experience    Comorbidities HTN, obesity, chronic back pain.    Examination-Activity Limitations Locomotion Level;Squat    Examination-Participation Restrictions Occupation;Community Activity    Stability/Clinical Decision Making Stable/Uncomplicated    Rehab Potential Good    PT Frequency 2x / week   8 visits over 8 weeks   PT Duration 8 weeks   8 visits over 8 weeks   PT Treatment/Interventions ADLs/Self Care Home Management;Gait training;Therapeutic activities;Balance training;Stair training;Therapeutic exercise;Functional mobility training;Neuromuscular re-education;Orthotic Fit/Training;Patient/family education;Vestibular    PT Next Visit Plan monitor BP. try resisted gait/side stepping. strength for L hip extensors, hip flexors and PF. try warmup on elliptical/SCifit. balance on unlevel surfaces, vision removed. SLS on LLE    Consulted and Agree with Plan of Care Patient             Patient will benefit from skilled therapeutic intervention in order to improve the following deficits and impairments:  Abnormal gait, Decreased activity tolerance, Decreased balance, Decreased coordination, Decreased endurance, Decreased strength  Visit Diagnosis: Muscle weakness (generalized)  Unsteadiness on feet  Other abnormalities of gait and mobility     Problem List Patient Active Problem List   Diagnosis Date Noted   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   Cerebrovascular accident (CVA) of right basal ganglia ([place]) 01/02/2021   CVA (cerebral vascular accident) ([place]) 12/29/2020   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 [place], [place], [address] Phone: [phone]   Fax:  [phone]  Name: Aaron Gibson MRN: [id number] Date of Birth: [birth year]

## 2021-10-11 ENCOUNTER — Telehealth: Payer: Self-pay

## 2021-10-11 NOTE — Telephone Encounter (Signed)
Called patient. No answer. LMOM. Just needs appt to review MRI. 

## 2021-10-11 NOTE — Progress Notes (Signed)
Called patient. No answer. LMOM. Just needs appt to review MRI.

## 2021-10-12 ENCOUNTER — Other Ambulatory Visit: Payer: Self-pay

## 2021-10-12 ENCOUNTER — Ambulatory Visit (INDEPENDENT_AMBULATORY_CARE_PROVIDER_SITE_OTHER): Payer: Medicaid Other

## 2021-10-12 ENCOUNTER — Ambulatory Visit: Payer: Medicaid Other | Admitting: Physical Therapy

## 2021-10-12 ENCOUNTER — Encounter: Payer: Self-pay | Admitting: Physical Therapy

## 2021-10-12 VITALS — BP 148/90

## 2021-10-12 DIAGNOSIS — M6281 Muscle weakness (generalized): Secondary | ICD-10-CM

## 2021-10-12 DIAGNOSIS — R2681 Unsteadiness on feet: Secondary | ICD-10-CM

## 2021-10-12 DIAGNOSIS — E538 Deficiency of other specified B group vitamins: Secondary | ICD-10-CM

## 2021-10-12 DIAGNOSIS — R2689 Other abnormalities of gait and mobility: Secondary | ICD-10-CM | POA: Diagnosis not present

## 2021-10-12 DIAGNOSIS — I69354 Hemiplegia and hemiparesis following cerebral infarction affecting left non-dominant side: Secondary | ICD-10-CM | POA: Diagnosis not present

## 2021-10-12 MED ORDER — CYANOCOBALAMIN 1000 MCG/ML IJ SOLN
1000.0000 ug | Freq: Once | INTRAMUSCULAR | Status: AC
  Administered 2021-10-12: 1000 ug via INTRAMUSCULAR

## 2021-10-12 NOTE — Therapy (Signed)
[facility] [address] [place], [place], [address] Phone: [phone]   Fax:  [phone]  Physical Therapy Treatment  Patient Details  Name: Aaron Gibson MRN: [id number] Date of Birth: 16-May-1966 Referring Provider (PT): Letta Pate Luanna Salk, MD   Encounter Date: 10/12/2021   PT End of Session - 10/12/21 1126     Visit Number 11    Number of Visits 15    Date for PT Re-Evaluation 10/22/21    Authorization Type Medicaid- 8 PT visits approved 10-4/22 - 11/26/21    Authorization - Visit Number 4    Authorization - Number of Visits 8   10/4-12/4 approved for 8   PT Start Time 1106    PT Stop Time 1147    PT Time Calculation (min) 41 min    Equipment Utilized During Treatment Gait belt    Activity Tolerance Patient tolerated treatment well    Behavior During Therapy [facility] for tasks assessed/performed             Past Medical History:  Diagnosis Date   Hypertension    untreated   Morbid obesity ([place])     Past Surgical History:  Procedure Laterality Date   BUBBLE STUDY  01/02/2021   Procedure: BUBBLE STUDY;  Surgeon: Freada Bergeron, MD;  Location: [facility];  Service: Cardiovascular;;   TEE WITHOUT CARDIOVERSION N/A 01/02/2021   Procedure: TRANSESOPHAGEAL ECHOCARDIOGRAM (TEE);  Surgeon: Freada Bergeron, MD;  Location: [facility] ENDOSCOPY;  Service: Cardiovascular;  Laterality: N/A;   UMBILICAL HERNIA REPAIR N/A 03/15/2015   Procedure: UMBILICAL HERNIA REPAIR WITH MESH;  Surgeon: Coralie Keens, MD;  Location: [facility];  Service: General;  Laterality: N/A;    Vitals:   10/12/21 1110  BP: (!) 148/90     Subjective Assessment - 10/12/21 1108     Subjective Waiting to get disability is taking a while.    Patient is accompained by: Family member   sister   Pertinent History HTN, obesity, chronic back pain.    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 Yes    Pain Score 4     Pain Location Shoulder    Pain Orientation Left    Pain Descriptors / Indicators Aching    Pain Type Chronic pain    Pain Onset More than a month ago    Aggravating Factors  high reaching    Pain Relieving Factors resting                               [facility] Adult PT Treatment/Exercise - 10/12/21 1113       Neuro Re-ed    Neuro Re-ed Details  Standing on rockerboard in A/P position: alternating marching x6 reps bilat, then stepping on with LLE and marching with RLE for SLS x5 reps      Exercises   Exercises Knee/Hip    Other Exercises  With kaye bench: x10 reps tall kneel mini squats, cues for eccentric control and glute activation      Knee/Hip Exercises: Aerobic   Elliptical Level 1.3 -1 minute forward and 1 minute backward working on L LE control/coordination.  Pt using bil UE support.  Then resting an additional minute and performing x1 more minute in the forwards direction.      Knee/Hip Exercises:  Machines for Strengthening   Cybex Leg Press BLE 65# 2 sets of 10 reps, LLE only 35# x 10 reps x 2 sets. cues for slowed and controlled. Brief rest break between each set      Knee/Hip Exercises: Sidelying   Hip ABduction Strengthening;AROM;3 sets;5 reps    Hip ABduction Limitations with LLE, verbal/tactile cues for proper technique. Pt fatiguing easily. Verbally added to pt's HEP                       PT Short Term Goals - 07/18/21 0947       PT SHORT TERM GOAL #1   Title Pt will be independent with initial HEP in order to build upon functional gains made in therapy. ALL STGS DUE AFTER 4TH VISIT.    Baseline pt reports compliance with HEP.    Time 4   visits   Status Achieved    Target Date 07/06/21      PT SHORT TERM GOAL #2   Title Pt will undergo further assessment of L foot up brace to assist with foot clearance during gait when pt is more fatigued.    Baseline met     Time 4   VISITS   Status Achieved      PT SHORT TERM GOAL #3   Title Pt will decr 5x sit <> stand time to 13 seconds or less without UE support to demo improved functional BLE strength.    Baseline 15.68 seconds no UE support; 14.7 seconds on 07/18/21    Time 4   visits   Status Not Met      PT SHORT TERM GOAL #4   Title Perform 6MWT with LTG written to demo improved walking endurance for community distances.    Baseline 982' on 06/19/21 - LTG written    Time 4   visits   Status Achieved               PT Long Term Goals - 08/23/21 0913       PT LONG TERM GOAL #1   Title Pt will be independent with final HEP in order to build upon functional gains made in therapy. ALL LTGS DUE 10/22/21    Baseline will continue to benefit from additions/updates.    Time 8   or 15th visit   Period Weeks    Status On-going    Target Date 10/22/21      PT LONG TERM GOAL #2   Title Pt will improve 6MWT distance to at least 1,200' with fatigue rating of 2/10 or less order to demo improved walking endurance for community distances.    Baseline 928'; 1,102' with 4/10 fatigue rating on 08/22/21    Time 8    Period Weeks    Status Revised      PT LONG TERM GOAL #3   Title Pt will improve gait speed to at least 3.3 ft/sec to demo improved community mobility.    Baseline 2.71 ft/sec - 2.85 ft/sec on 07/25/21; 3.08 ft/sec on 08/22/21    Time 8    Period Weeks    Status Revised      PT LONG TERM GOAL #4   Title Pt will improve FGA score to at least a 29/30 in order to demo decr fall risk.    Baseline 26/30 on 07/25/21; 27/30 on 08/22/21    Time 8    Period Weeks    Status Revised  Plan - 10/12/21 1150     Clinical Impression Statement Continued to focus on LLE>RLE strengthening and SLS balance strategies. Pt challenged by sidelying L hip ABDuction - verbally added to pt's HEP, beginning with 2-3 sets of 5 reps. Will continue to progress towards LTGs.    Personal  Factors and Comorbidities Comorbidity 3+;Profession;Past/Current Experience    Comorbidities HTN, obesity, chronic back pain.    Examination-Activity Limitations Locomotion Level;Squat    Examination-Participation Restrictions Occupation;Community Activity    Stability/Clinical Decision Making Stable/Uncomplicated    Rehab Potential Good    PT Frequency 2x / week   8 visits over 8 weeks   PT Duration 8 weeks   8 visits over 8 weeks   PT Treatment/Interventions ADLs/Self Care Home Management;Gait training;Therapeutic activities;Balance training;Stair training;Therapeutic exercise;Functional mobility training;Neuromuscular re-education;Orthotic Fit/Training;Patient/family education;Vestibular    PT Next Visit Plan monitor BP. try resisted gait/side stepping?. strength for L hip extensors, hip flexors and PF. try warmup on elliptical/SCifit. SLS on LLE. pt ready for D/C at end of next week    Consulted and Agree with Plan of Care Patient             Patient will benefit from skilled therapeutic intervention in order to improve the following deficits and impairments:  Abnormal gait, Decreased activity tolerance, Decreased balance, Decreased coordination, Decreased endurance, Decreased strength  Visit Diagnosis: Muscle weakness (generalized)  Unsteadiness on feet  Other abnormalities of gait and mobility     Problem List Patient Active Problem List   Diagnosis Date Noted   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   Cerebrovascular accident (CVA) of right basal ganglia ([place]) 01/02/2021   CVA (cerebral vascular accident) ([place]) 12/29/2020   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  12/28/2020   Chest pain, musculoskeletal 12/28/2020   Morbid obesity ([place])    Hypertension     Arliss Journey, PT, DPT 10/12/2021, 11:52 AM  [facility] [address] [place], [place],  [address] Phone: [phone]   Fax:  [phone]  Name: Aaron Gibson MRN: [id number] Date of Birth: 1966-04-08

## 2021-10-12 NOTE — Progress Notes (Addendum)
Patient here for first weekly b12 injection per Ramon Dredge.   He is to get 4 weekly b12 injections for 4 weeks, then monthly.  He is to recheck b12 in 6 months (around April).   Injection given in left deltoid and patient tolerated well.   Appointment made for next couple of weekly b12.   Esperanza Richters, PA-C

## 2021-10-16 ENCOUNTER — Encounter: Payer: Self-pay | Admitting: Medical

## 2021-10-16 ENCOUNTER — Ambulatory Visit: Payer: Medicaid Other | Admitting: Medical

## 2021-10-16 ENCOUNTER — Telehealth: Payer: Self-pay | Admitting: Orthopaedic Surgery

## 2021-10-16 ENCOUNTER — Other Ambulatory Visit: Payer: Self-pay

## 2021-10-16 VITALS — BP 120/80 | HR 82 | Temp 98.4°F | Resp 16 | Wt 229.0 lb

## 2021-10-16 DIAGNOSIS — E538 Deficiency of other specified B group vitamins: Secondary | ICD-10-CM | POA: Diagnosis not present

## 2021-10-16 DIAGNOSIS — I10 Essential (primary) hypertension: Secondary | ICD-10-CM | POA: Diagnosis not present

## 2021-10-16 DIAGNOSIS — E1169 Type 2 diabetes mellitus with other specified complication: Secondary | ICD-10-CM

## 2021-10-16 DIAGNOSIS — E118 Type 2 diabetes mellitus with unspecified complications: Secondary | ICD-10-CM | POA: Diagnosis not present

## 2021-10-16 DIAGNOSIS — E785 Hyperlipidemia, unspecified: Secondary | ICD-10-CM

## 2021-10-16 DIAGNOSIS — M25512 Pain in left shoulder: Secondary | ICD-10-CM

## 2021-10-16 NOTE — Progress Notes (Signed)
Subjective:    Patient ID: Aaron Gibson, male    DOB: [birth year], 55 y.o.   MRN: [id number]  HPI  Pt in for follow up. He states his bp readiings are 140/80 at home. Just started taking 50 mg losartan on Friday.  Hx of low b12.  Pt has been trying to lose weight. Lost few pounds.  Pt ha diabetes.on metformin. 1 tab po bid. No obvious side effects.   Hx of stroke. He is on lipitor.   Dr [name] office had not contacted pt on his mri. Not sure when his appointment.    Review of Systems  Constitutional:  Negative for chills, fatigue and fever.  Respiratory:  Negative for cough, chest tightness, shortness of breath and wheezing.   Cardiovascular:  Negative for chest pain and palpitations.  Gastrointestinal:  Negative for abdominal pain.  Genitourinary:  Negative for dysuria, enuresis and flank pain.  Musculoskeletal:  Negative for back pain and myalgias.  Skin:  Negative for color change and rash.  Neurological:  Negative for dizziness, syncope, weakness, light-headedness and headaches.  Psychiatric/Behavioral:  Negative for behavioral problems and decreased concentration.     Past Medical History:  Diagnosis Date   Hypertension    untreated   Morbid obesity ([place])      Social History   Socioeconomic History   Marital status: Single    Spouse name: Not on file   Number of children: Not on file   Years of education: Not on file   Highest education level: Not on file  Occupational History   Occupation: Unemployed   Tobacco Use   Smoking status: Former    Packs/day: 1.00    Years: 29.00    Pack years: 29.00    Types: Cigarettes   Smokeless tobacco: Never   Tobacco comments:    quit 12/28/2020  Vaping Use   Vaping Use: Never used  Substance and Sexual Activity   Alcohol use: Yes    Comment: occasionally holidays   Drug use: No   Sexual activity: Yes  Other Topics Concern   Not on file  Social History Narrative   Lives with daughter   Right Handed   Caffeine  2x week   Social Determinants of Health   Financial Resource Strain: Not on file  Food Insecurity: Not on file  Transportation Needs: Not on file  Physical Activity: Not on file  Stress: Not on file  Social Connections: Not on file  Intimate Partner Violence: Not on file    Past Surgical History:  Procedure Laterality Date   BUBBLE STUDY  01/02/2021   Procedure: BUBBLE STUDY;  Surgeon: Freada Bergeron, MD;  Location: [facility];  Service: Cardiovascular;;   TEE WITHOUT CARDIOVERSION N/A 01/02/2021   Procedure: TRANSESOPHAGEAL ECHOCARDIOGRAM (TEE);  Surgeon: Freada Bergeron, MD;  Location: [facility] ENDOSCOPY;  Service: Cardiovascular;  Laterality: N/A;   UMBILICAL HERNIA REPAIR N/A 03/15/2015   Procedure: UMBILICAL HERNIA REPAIR WITH MESH;  Surgeon: Coralie Keens, MD;  Location: [place];  Service: General;  Laterality: N/A;    No family history on file.  Not on File  Current Outpatient Medications on File Prior to Visit  Medication Sig Dispense Refill   Accu-Chek Softclix Lancets lancets Use to check Blood sugars once a day. Dx E11.8 100 each 1   acetaminophen (TYLENOL) 325 MG tablet Take 2 tablets (650 mg total) by mouth every 4 (four) hours as needed for mild pain (or temp > 37.5 C (99.5 F)).  albuterol (VENTOLIN HFA) 108 (90 Base) MCG/ACT inhaler Inhale 2 puffs into the lungs every 6 (six) hours as needed. 18 g 0   amLODipine (NORVASC) 10 MG tablet TAKE 1 TABLET(10 MG) BY MOUTH DAILY 90 tablet 0   aspirin 81 MG chewable tablet Chew 1 tablet (81 mg total) by mouth daily.     atorvastatin (LIPITOR) 80 MG tablet TAKE 1 TABLET(80 MG) BY MOUTH DAILY 30 tablet 5   Blood Glucose Monitoring Suppl (ACCU-CHEK GUIDE ME) w/Device KIT Use to check Blood sugars once a day. Dx E11.8 1 kit 0   buPROPion (WELLBUTRIN XL) 150 MG 24 hr tablet Take 1 tablet (150 mg total) by mouth daily. 30 tablet 3   clopidogrel (PLAVIX) 75 MG tablet TAKE 1 TABLET (75 MG TOTAL) BY MOUTH DAILY. 13 tablet 0    glucose blood (ACCU-CHEK GUIDE) test strip Use to check Blood sugars once a day. Dx E11.8 100 each 1   lidocaine (LIDODERM) 5 % PLACE 1 PATCH ONTO THE SKIN DAILY. REMOVE & DISCARD PATCH WITHIN 12 HOURS OR AS DIRECTED BY MD 30 patch 0   losartan (COZAAR) 50 MG tablet Take 1 tablet (50 mg total) by mouth daily. 90 tablet 1   metFORMIN (GLUCOPHAGE) 500 MG tablet Take 1 tablet (500 mg total) by mouth 2 (two) times daily with a meal. 60 tablet 3   methocarbamol (ROBAXIN) 500 MG tablet TAKE 1 TABLET (500 MG TOTAL) BY MOUTH EVERY EIGHT HOURS. 60 tablet 0   nicotine (NICODERM CQ - DOSED IN MG/24 HR) 7 mg/24hr patch APPLY 1 PATCH (7MG) TAKE ONTO SKIN DAILY X3 WEEKS THEN STOP 28 patch 0   pantoprazole (PROTONIX) 40 MG tablet TAKE 1 TABLET (40 MG TOTAL) BY MOUTH DAILY. 30 tablet 0   traZODone (DESYREL) 100 MG tablet TAKE 1 TABLET (100 MG TOTAL) BY MOUTH AT BEDTIME AS NEEDED FOR SLEEP. 30 tablet 0   No current facility-administered medications on file prior to visit.    BP 120/80   Pulse 82   Temp 98.4 F (36.9 C) (Oral)   Resp 16   Wt 229 lb (103.9 kg)   SpO2 98%   BMI 33.82 kg/m       Objective:   Physical Exam  General Mental Status- Alert. General Appearance- Not in acute distress.   Skin General: Color- Normal Color. Moisture- Normal Moisture.  Neck Carotid Arteries- Normal color. Moisture- Normal Moisture. No carotid bruits. No JVD.  Chest and Lung Exam Auscultation: Breath Sounds:-Normal.  Cardiovascular Auscultation:Rythm- Regular. Murmurs & Other Heart Sounds:Auscultation of the heart reveals- No Murmurs.  Abdomen Inspection:-Inspeection Normal. Palpation/Percussion:Note:No mass. Palpation and Percussion of the abdomen reveal- Non Tender, Non Distended + BS, no rebound or guarding.   Neurologic Cranial Nerve exam:- CN III-XII intact(No nystagmus), symmetric smile. Strength:- 5/5 equal and symmetric strength both upper and lower extremities.       Assessment &  Plan:   Patient Instructions  Htn well controlled on repeat bp check in office. Continue amlodipine 10 mg daily and losartan 50 mg daily. Check bp daily over the next week and update me by my chart.   Diabetes. Continue metformin and low sugar diet. Well controlled as last a1c was 6.6  Hyperlipidemia  and history of stroke. Continue lipitor. Last lipid panel well controlled.  Low b12 IM injection. Follow schedule as formerly described weekly x 4 weeks followed by monthly injections for 5 months.  For chronic left shoulder pain follow up with Dr. [name]. We reviewed  mri finding today. Regarding disability concerns best to address this with specialist.   Follow mid December or sooner if needed.   Mackie Pai, PA-C

## 2021-10-16 NOTE — Telephone Encounter (Signed)
Called patient left message to return call to schedule an appointment for MRI review with Dr. Xu 

## 2021-10-17 ENCOUNTER — Telehealth: Payer: Self-pay | Admitting: Physical Therapy

## 2021-10-17 ENCOUNTER — Ambulatory Visit: Payer: Medicaid Other | Admitting: Physical Therapy

## 2021-10-17 NOTE — Patient Instructions (Addendum)
Htn well controlled on repeat bp check in office. Continue amlodipine 10 mg daily and losartan 50 mg daily. Check bp daily over the next week and update me by my chart.   Diabetes. Continue metformin and low sugar diet. Well controlled as last a1c was 6.6  Hyperlipidemia  and history of stroke. Continue lipitor. Last lipid panel well controlled.  Low b12 IM injection. Follow schedule as formerly described weekly x 4 weeks followed by monthly injections for 5 months.  For chronic left shoulder pain follow up with Dr. Roda Shutters. We reviewed mri finding today. Regarding disability concerns best to address this with specialist.   Follow mid December or sooner if needed.

## 2021-10-17 NOTE — Telephone Encounter (Signed)
Called pt regarding no show therapy appointment today and had to leave voicemail. Reminded pt of his next appointment this Thursday at 11 AM.  Sherlie Ban, PT, DPT 10/17/21 11:29 AM

## 2021-10-19 ENCOUNTER — Ambulatory Visit: Payer: Medicaid Other | Admitting: Physical Therapy

## 2021-10-19 ENCOUNTER — Other Ambulatory Visit: Payer: Self-pay

## 2021-10-19 ENCOUNTER — Ambulatory Visit (INDEPENDENT_AMBULATORY_CARE_PROVIDER_SITE_OTHER): Payer: Medicaid Other

## 2021-10-19 ENCOUNTER — Encounter: Payer: Self-pay | Admitting: Physical Therapy

## 2021-10-19 DIAGNOSIS — R2689 Other abnormalities of gait and mobility: Secondary | ICD-10-CM | POA: Diagnosis not present

## 2021-10-19 DIAGNOSIS — E538 Deficiency of other specified B group vitamins: Secondary | ICD-10-CM

## 2021-10-19 DIAGNOSIS — M6281 Muscle weakness (generalized): Secondary | ICD-10-CM

## 2021-10-19 DIAGNOSIS — R2681 Unsteadiness on feet: Secondary | ICD-10-CM | POA: Diagnosis not present

## 2021-10-19 DIAGNOSIS — I69354 Hemiplegia and hemiparesis following cerebral infarction affecting left non-dominant side: Secondary | ICD-10-CM | POA: Diagnosis not present

## 2021-10-19 MED ORDER — CYANOCOBALAMIN 1000 MCG/ML IJ SOLN
1000.0000 ug | Freq: Once | INTRAMUSCULAR | Status: AC
  Administered 2021-10-19: 1000 ug via INTRAMUSCULAR

## 2021-10-19 NOTE — Therapy (Addendum)
[facility] [address] [place], [place], [address] Phone: [phone]   Fax:  [phone]  Physical Therapy Treatment/Discharge Summary  Patient Details  Name: Aaron Gibson MRN: [id number] Date of Birth: 1966-09-23 Referring Provider (PT): Letta Pate Luanna Salk, MD   Encounter Date: 10/19/2021   PT End of Session - 10/19/21 1115     Visit Number 12    Number of Visits 15    Date for PT Re-Evaluation 10/22/21    Authorization Type Medicaid- 8 PT visits approved 10-4/22 - 11/26/21    Authorization - Visit Number 5    Authorization - Number of Visits 8   10/4-12/4 approved for 8   PT Start Time 1113   pt running late for session   PT Stop Time 1145    PT Time Calculation (min) 32 min    Equipment Utilized During Treatment Gait belt    Activity Tolerance Patient tolerated treatment well    Behavior During Therapy [facility] for tasks assessed/performed             Past Medical History:  Diagnosis Date   Hypertension    untreated   Morbid obesity ([place])     Past Surgical History:  Procedure Laterality Date   BUBBLE STUDY  01/02/2021   Procedure: BUBBLE STUDY;  Surgeon: Freada Bergeron, MD;  Location: [facility];  Service: Cardiovascular;;   TEE WITHOUT CARDIOVERSION N/A 01/02/2021   Procedure: TRANSESOPHAGEAL ECHOCARDIOGRAM (TEE);  Surgeon: Freada Bergeron, MD;  Location: [facility] ENDOSCOPY;  Service: Cardiovascular;  Laterality: N/A;   UMBILICAL HERNIA REPAIR N/A 03/15/2015   Procedure: UMBILICAL HERNIA REPAIR WITH MESH;  Surgeon: Coralie Keens, MD;  Location: [facility];  Service: General;  Laterality: N/A;    There were no vitals filed for this visit.   Subjective Assessment - 10/19/21 1113     Subjective No new complaints. Continues with left shoulder pain, just had an MRI on it, no results yet.    Pertinent History HTN, obesity, chronic back pain.    Limitations Walking    Patient Stated Goals wants to  improve his mobility to go back to work - is an Engineer, technical sales    Currently in Pain? Yes    Pain Score 4     Pain Location Shoulder    Pain Orientation Left    Pain Descriptors / Indicators Aching;Sore    Pain Type Chronic pain    Pain Onset More than a month ago    Pain Frequency Intermittent    Aggravating Factors  reaching out, up or behind back    Pain Relieving Factors resting                [facility] PT Assessment - 10/19/21 1116       6 Minute Walk- Baseline   6 Minute Walk- Baseline yes    BP (mmHg) 144/92    HR (bpm) 90    02 Sat (%RA) 99 %    Modified Borg Scale for Dyspnea 0- Nothing at all    Perceived Rate of Exertion (Borg) 6-      6 Minute walk- Post Test   6 Minute Walk Post Test yes    BP (mmHg) 140/84    HR (bpm) 100    02 Sat (%RA) 98 %    Modified Borg Scale for Dyspnea 0- Nothing at all    Perceived Rate of Exertion (Borg) 12-      6 minute walk test results  Aerobic Endurance Distance Walked 878    Endurance additional comments no device. pt focusing on gait mechanics with heel strike on left foot which he reports 'slows him down".      Functional Gait  Assessment   Gait assessed  Yes    Gait Level Surface Walks 20 ft in less than 7 sec but greater than 5.5 sec, uses assistive device, slower speed, mild gait deviations, or deviates 6-10 in outside of the 12 in walkway width.   6.50 sec's   Change in Gait Speed Able to change speed, demonstrates mild gait deviations, deviates 6-10 in outside of the 12 in walkway width, or no gait deviations, unable to achieve a major change in velocity, or uses a change in velocity, or uses an assistive device.    Gait with Horizontal Head Turns Performs head turns smoothly with no change in gait. Deviates no more than 6 in outside 12 in walkway width    Gait with Vertical Head Turns Performs head turns with no change in gait. Deviates no more than 6 in outside 12 in walkway width.    Gait and Pivot Turn Pivot  turns safely within 3 sec and stops quickly with no loss of balance.    Step Over Obstacle Is able to step over 2 stacked shoe boxes taped together (9 in total height) without changing gait speed. No evidence of imbalance.    Gait with Narrow Base of Support Ambulates 7-9 steps.    Gait with Eyes Closed Walks 20 ft, slow speed, abnormal gait pattern, evidence for imbalance, deviates 10-15 in outside 12 in walkway width. Requires more than 9 sec to ambulate 20 ft.   10.19 sec's   Ambulating Backwards Walks 20 ft, uses assistive device, slower speed, mild gait deviations, deviates 6-10 in outside 12 in walkway width.   17.75 sec's   Steps Alternating feet, must use rail.    Total Score 23    FGA comment: 23/30                    [facility] Adult PT Treatment/Exercise - 10/19/21 1130       Transfers   Transfers Sit to Stand;Stand to Sit    Sit to Stand 6: Modified independent (Device/Increase time)    Stand to Sit 6: Modified independent (Device/Increase time)      Ambulation/Gait   Ambulation/Gait Yes    Ambulation/Gait Assistance 5: Supervision    Ambulation Distance (Feet) --   around clinic with session   Assistive device None    Gait Pattern Step-through pattern;Decreased dorsiflexion - left;Decreased weight shift to left;Left foot flat;Decreased arm swing - left    Ambulation Surface Level;Indoor    Gait velocity 11.34 sec's= 2.89 ft/sec no AD      Self-Care   Self-Care Other Self-Care Comments    Other Self-Care Comments  Pt reports no issues with current HEP. Just recieved ankle weights to use with strengthening ex's.  Plans to start working out with a trainer at [employer] (already joined) once PT wraps up.                PHYSICAL THERAPY DISCHARGE SUMMARY  Visits from Start of Care: 12  Current functional level related to goals / functional outcomes: See LTGs.   Remaining deficits: Impaired strength, gait abnormalities, decr endurance, balance  abnormalities.   Education / Equipment: HEP. Pt joined [employer] to work out with a Clinical research associate and use seated exercise bike for aerobic activity.  Patient agrees to discharge. Patient goals were not met. Patient is being discharged due to maximized rehab potential.         PT Short Term Goals - 07/18/21 0947       PT SHORT TERM GOAL #1   Title Pt will be independent with initial HEP in order to build upon functional gains made in therapy. ALL STGS DUE AFTER 4TH VISIT.    Baseline pt reports compliance with HEP.    Time 4   visits   Status Achieved    Target Date 07/06/21      PT SHORT TERM GOAL #2   Title Pt will undergo further assessment of L foot up brace to assist with foot clearance during gait when pt is more fatigued.    Baseline met    Time 4   VISITS   Status Achieved      PT SHORT TERM GOAL #3   Title Pt will decr 5x sit <> stand time to 13 seconds or less without UE support to demo improved functional BLE strength.    Baseline 15.68 seconds no UE support; 14.7 seconds on 07/18/21    Time 4   visits   Status Not Met      PT SHORT TERM GOAL #4   Title Perform 6MWT with LTG written to demo improved walking endurance for community distances.    Baseline 982' on 06/19/21 - LTG written    Time 4   visits   Status Achieved               PT Long Term Goals - 10/19/21 2113       PT LONG TERM GOAL #1   Title Pt will be independent with final HEP in order to build upon functional gains made in therapy. ALL LTGS DUE 10/22/21    Baseline 10/19/21: met with current program and has joined [employer]. Plans to start working with a trainer at [employer].    Time --   or 15th visit   Status Achieved      PT LONG TERM GOAL #2   Title Pt will improve 6MWT distance to at least 1,200' with fatigue rating of 2/10 or less order to demo improved walking endurance for community distances.    Baseline 10/19/21: 878 feet no AD, fatige 2/10. (was 1, 102 feet at last assessment, however pt reports  focusing more on gait mechanics now which he feels slows him down).    Status Partially Met      PT LONG TERM GOAL #3   Title Pt will improve gait speed to at least 3.3 ft/sec to demo improved community mobility.    Baseline 10/19/21: 2.89 ft/sec no AD. (decreased from 3.08 ft/sec on 08/22/21)    Status Not Met      PT LONG TERM GOAL #4   Title Pt will improve FGA score to at least a 29/30 in order to demo decr fall risk.    Baseline 10/19/21: 23/30 scored today (decreased from 27/30 on 08/22/21)    Status Not Met                   Plan - 10/19/21 2050     Clinical Impression Statement Today's skilled session focused on progress toward LTGs with HEP goal met. Pt partially met the 6 minute walk test goal with improvement in fatigue afterwards with decreased distance. Pt did not meet the gait speed or FGA goals with a decline noted with both.  Pt reports he is "focusing" more on how he advances his left LE so not to "fling" it forward with stepping and feels this slows him down for now. Pt agreeable to discharge even after discussion of possiblly continuing for missed visits due to decline in some test values. however pt want to start at [facility] and return later if needed. Will plan to discharge per PT plan of care.    Personal Factors and Comorbidities Comorbidity 3+;Profession;Past/Current Experience    Comorbidities HTN, obesity, chronic back pain.    Examination-Activity Limitations Locomotion Level;Squat    Examination-Participation Restrictions Occupation;Community Activity    Stability/Clinical Decision Making Stable/Uncomplicated    Rehab Potential Good    PT Frequency 2x / week   8 visits over 8 weeks   PT Duration 8 weeks   8 visits over 8 weeks   PT Treatment/Interventions ADLs/Self Care Home Management;Gait training;Therapeutic activities;Balance training;Stair training;Therapeutic exercise;Functional mobility training;Neuromuscular re-education;Orthotic  Fit/Training;Patient/family education;Vestibular    PT Next Visit Plan discharge pt PT plan of care    PT Home Exercise Plan Access Code: [email or web address]    Consulted and Agree with Plan of Care Patient             Patient will benefit from skilled therapeutic intervention in order to improve the following deficits and impairments:  Abnormal gait, Decreased activity tolerance, Decreased balance, Decreased coordination, Decreased endurance, Decreased strength  Visit Diagnosis: Muscle weakness (generalized)  Unsteadiness on feet  Other abnormalities of gait and mobility     Problem List Patient Active Problem List   Diagnosis Date Noted   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   Cerebrovascular accident (CVA) of right basal ganglia ([place]) 01/02/2021   CVA (cerebral vascular accident) ([place]) 12/29/2020   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, [place] [address] [phone] 10/19/21, 9:29 PM   Name: Jermayne Sweeney MRN: [id number] Date of Birth: 07-05-1966   Janann August, PT, DPT 11/06/21 11:16 AM

## 2021-10-19 NOTE — Progress Notes (Signed)
Patient here for first weekly b12 injection per Edward.   He is to get 4 weekly b12 injections for 4 weeks, then monthly.  He is to recheck b12 in 6 months (around April).   Injection given in left deltoid and patient tolerated well.   Appointment made for next couple of weekly b12.   Edward Saguier, PA-C  

## 2021-10-23 ENCOUNTER — Encounter: Payer: Self-pay | Admitting: Orthopaedic Surgery

## 2021-10-25 ENCOUNTER — Ambulatory Visit: Payer: Medicaid Other

## 2021-10-31 ENCOUNTER — Encounter: Payer: Self-pay | Admitting: Orthopaedic Surgery

## 2021-10-31 ENCOUNTER — Ambulatory Visit: Payer: Medicaid Other | Admitting: Orthopaedic Surgery

## 2021-10-31 ENCOUNTER — Other Ambulatory Visit: Payer: Self-pay

## 2021-10-31 DIAGNOSIS — M7502 Adhesive capsulitis of left shoulder: Secondary | ICD-10-CM

## 2021-10-31 NOTE — Progress Notes (Signed)
Office Visit Note   Patient: Aaron Gibson           Date of Birth: 1966-02-07           MRN: [id number] Visit Date: 10/31/2021              Requested by: Esperanza Richters, PA-C [address] [place],  [place] [address] PCP: Esperanza Richters, PA-C   Assessment & Plan: Visit Diagnoses:  1. Adhesive capsulitis of left shoulder     Plan: MRI shows mild tendinosis of the rotator cuff without any full-thickness tears.  There is a tenosynovitis of the biceps tendon.  No real structural abnormalities otherwise.  His presentation is more consistent with adhesive capsulitis therefore we will arrange for him to receive glenohumeral injection with Dr. Alvester Morin.  We will also send him to outpatient physical therapy at [facility].  Recheck in 8 weeks.  Follow-Up Instructions: Return in about 2 months (around 12/31/2021).   Orders:  Orders Placed This Encounter  Procedures   Ambulatory referral to Physical Therapy   Ambulatory referral to Physical Medicine Rehab   No orders of the defined types were placed in this encounter.     Procedures: No procedures performed   Clinical Data: No additional findings.   Subjective: Chief Complaint  Patient presents with   Left Shoulder - Follow-up    MRI review    Sherri returns today for review of left shoulder MRI.  He had a subacromial injection earlier this year which helped for about a week.   Review of Systems   Objective: Vital Signs: There were no vitals taken for this visit.  Physical Exam  Ortho Exam  Left shoulder range of motion shows flexion to 120 external rotation to 45 internal rotation to back pocket abduction to 75.  Strength is grossly intact.  Specialty Comments:  No specialty comments available.  Imaging: No results found.   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  Past Surgical History:  Procedure Laterality Date   BUBBLE STUDY  01/02/2021   Procedure: BUBBLE STUDY;  Surgeon: Meriam Sprague, MD;  Location: [facility] ENDOSCOPY;  Service: Cardiovascular;;   TEE WITHOUT CARDIOVERSION N/A 01/02/2021   Procedure: TRANSESOPHAGEAL ECHOCARDIOGRAM (TEE);  Surgeon: Meriam Sprague, MD;  Location: [facility] ENDOSCOPY;  Service: Cardiovascular;  Laterality: N/A;   UMBILICAL HERNIA REPAIR N/A 03/15/2015   Procedure: UMBILICAL HERNIA REPAIR WITH MESH;  Surgeon: Abigail Miyamoto, MD;  Location: [facility] OR;  Service: General;  Laterality: N/A;   Social History   Occupational History   Occupation: Unemployed   Tobacco Use   Smoking status: Former    Packs/day: 1.00    Years: 29.00    Pack years: 29.00    Types: Cigarettes   Smokeless tobacco: Never   Tobacco comments:    quit 12/28/2020  Vaping Use   Vaping Use: Never used  Substance and Sexual Activity   Alcohol use: Yes  Comment: occasionally holidays   Drug use: No   Sexual activity: Yes

## 2021-11-07 ENCOUNTER — Other Ambulatory Visit: Payer: Self-pay

## 2021-11-07 ENCOUNTER — Ambulatory Visit: Payer: Medicaid Other | Admitting: Medical

## 2021-11-07 ENCOUNTER — Ambulatory Visit (INDEPENDENT_AMBULATORY_CARE_PROVIDER_SITE_OTHER): Payer: Medicaid Other

## 2021-11-07 DIAGNOSIS — E538 Deficiency of other specified B group vitamins: Secondary | ICD-10-CM

## 2021-11-07 MED ORDER — CYANOCOBALAMIN 1000 MCG/ML IJ SOLN
1000.0000 ug | Freq: Once | INTRAMUSCULAR | Status: AC
  Administered 2021-11-07: 1000 ug via INTRAMUSCULAR

## 2021-11-07 NOTE — Progress Notes (Addendum)
Pt here for weekly B12 injection per Marisue Brooklyn  B12 given IM,left deltoid and pt tolerated injection well.  Next B12 injection scheduled for next week.  Esperanza Richters, PA-C

## 2021-11-08 ENCOUNTER — Encounter: Payer: Self-pay | Admitting: Orthopaedic Surgery

## 2021-11-09 ENCOUNTER — Telehealth: Payer: Self-pay | Admitting: Orthopaedic Surgery

## 2021-11-09 ENCOUNTER — Telehealth: Payer: Self-pay | Admitting: Physical Medicine and Rehabilitation

## 2021-11-09 NOTE — Telephone Encounter (Signed)
Spoke to Dr. Roda Shutters he states Okay to get shoulder inj first. Wait a couple of weeks and if doing well okay to start PT.  Patient has PT information already. He can call them to schedule appointment. Patient is aware.

## 2021-11-09 NOTE — Telephone Encounter (Signed)
Pt called asking for a CB to clarify who the they was in the [facility] message that was sent to him. I told pt from the message it sounds like the they is the rehab at [facility] but he would like a CB from [facility] to confirm.   [phone]

## 2021-11-09 NOTE — Telephone Encounter (Signed)
Pt called stating he has a referral and would like a CB to set an appt.   [phone]

## 2021-11-14 ENCOUNTER — Ambulatory Visit: Payer: Medicaid Other

## 2021-11-14 NOTE — Progress Notes (Deleted)
Aaron Gibson is a 55 y.o. male presents to the office today for 4/4 weekly B12 injection, per physician's orders. Original order: 09/29/21: "Your b12 level is low. This may be reason you feel fatigued. Recommend get scheduled for weekly b12 injections for 4 weeks. Then monthly b12 injections for 5 months. Repeat b12 level in 6 months." B12 10000 mcg  (med),  IM (route) was administered *** (location) today. Patient tolerated injection. Patient next injection due: 1 month, appt made {yes/no:20286}  [place], Melton Alar L

## 2021-11-27 ENCOUNTER — Encounter: Payer: Self-pay | Admitting: Medical

## 2021-11-29 ENCOUNTER — Other Ambulatory Visit: Payer: Self-pay

## 2021-11-29 ENCOUNTER — Ambulatory Visit: Payer: Medicaid Other

## 2021-11-29 ENCOUNTER — Ambulatory Visit (INDEPENDENT_AMBULATORY_CARE_PROVIDER_SITE_OTHER): Payer: Medicaid Other | Admitting: Physical Medicine and Rehabilitation

## 2021-11-29 ENCOUNTER — Encounter: Payer: Self-pay | Admitting: Physical Medicine and Rehabilitation

## 2021-11-29 DIAGNOSIS — G8929 Other chronic pain: Secondary | ICD-10-CM

## 2021-11-29 DIAGNOSIS — M25512 Pain in left shoulder: Secondary | ICD-10-CM | POA: Diagnosis not present

## 2021-11-29 NOTE — Progress Notes (Signed)
Pt state left shoulder pain. Pt state rasing his left arm the pain gets worse. Pt state he hold his arm to his body to help ease his pain.  Numeric Pain Rating Scale and Functional Assessment Average Pain 4   In the last MONTH (on 0-10 scale) has pain interfered with the following?  1. General activity like being  able to carry out your everyday physical activities such as walking, climbing stairs, carrying groceries, or moving a chair?  Rating(10)   +BT, -Dye Allergies.

## 2021-11-29 NOTE — Progress Notes (Signed)
   Aaron Gibson - 55 y.o. male MRN [id number]  Date of birth: 08-06-1966  Office Visit Note: Visit Date: 11/29/2021 PCP: Esperanza Richters, PA-C Referred by: Marisue Brooklyn  Subjective: Chief Complaint  Patient presents with   Left Shoulder - Pain    HPI:  Aaron Gibson is a 55 y.o. male who comes in today at the request of Dr. Glee Arvin for planned Left anesthetic glenohumeral arthrogram with fluoroscopic guidance.  The patient has failed conservative care including home exercise, medications, time and activity modification.  This injection will be diagnostic and hopefully therapeutic.  Please see requesting physician notes for further details and justification.   ROS Otherwise per HPI.  Assessment & Plan: Visit Diagnoses:    ICD-10-CM   1. Chronic left shoulder pain  M25.512 XR C-ARM NO REPORT   G89.29       Plan: No additional findings.   Meds & Orders: No orders of the defined types were placed in this encounter.    Orders Placed This Encounter  Procedures   Large Joint Inj   XR C-ARM NO REPORT    Follow-up: Return in about 4 weeks (around 12/27/2021) for Glee Arvin, MD.   Procedures: Large Joint Inj: L glenohumeral on 11/29/2021 1:30 PM Indications: pain and diagnostic evaluation Details: 22 G 3.5 in needle, fluoroscopy-guided anteromedial approach  Arthrogram: No  Medications: 40 mg triamcinolone acetonide 40 MG/ML; 5 mL bupivacaine 0.25 % Outcome: tolerated well, no immediate complications  There was excellent flow of contrast producing a partial arthrogram of the glenohumeral joint. The patient did have relief of symptoms during the anesthetic phase of the injection. Procedure, treatment alternatives, risks and benefits explained, specific risks discussed. Consent was given by the patient. Immediately prior to procedure a time out was called to verify the correct patient, procedure, equipment, support staff and site/side marked as required. Patient was  prepped and draped in the usual sterile fashion.         Clinical History: No specialty comments available.     Objective:  VS:  HT:    WT:   BMI:     BP:   HR: bpm  TEMP: ( )  RESP:  Physical Exam   Imaging: No results found.

## 2021-11-30 ENCOUNTER — Encounter: Payer: Self-pay | Admitting: Physical Medicine and Rehabilitation

## 2021-11-30 ENCOUNTER — Telehealth: Payer: Self-pay

## 2021-11-30 ENCOUNTER — Ambulatory Visit (INDEPENDENT_AMBULATORY_CARE_PROVIDER_SITE_OTHER): Payer: Medicaid Other

## 2021-11-30 ENCOUNTER — Other Ambulatory Visit (HOSPITAL_BASED_OUTPATIENT_CLINIC_OR_DEPARTMENT_OTHER): Payer: Self-pay

## 2021-11-30 ENCOUNTER — Other Ambulatory Visit: Payer: Self-pay | Admitting: Physician Assistant

## 2021-11-30 ENCOUNTER — Telehealth: Payer: Self-pay | Admitting: Physical Medicine and Rehabilitation

## 2021-11-30 DIAGNOSIS — E538 Deficiency of other specified B group vitamins: Secondary | ICD-10-CM | POA: Diagnosis not present

## 2021-11-30 MED ORDER — HYDROCODONE-ACETAMINOPHEN 5-325 MG PO TABS
1.0000 | ORAL_TABLET | Freq: Two times a day (BID) | ORAL | 0 refills | Status: DC | PRN
Start: 2021-11-30 — End: 2021-12-22

## 2021-11-30 MED ORDER — CYANOCOBALAMIN 1000 MCG/ML IJ SOLN
1000.0000 ug | Freq: Once | INTRAMUSCULAR | Status: AC
Start: 2021-11-30 — End: 2021-11-30
  Administered 2021-11-30: 1000 ug via INTRAMUSCULAR

## 2021-11-30 NOTE — Telephone Encounter (Signed)
Sent in a small rx for norco.  He can also add 800mg  ibuprofen tid if needed and can tolerate

## 2021-11-30 NOTE — Progress Notes (Addendum)
Aaron Gibson is a 55 y.o. male presents to the office today for B12:  injections, per physician's orders. Original order: on lab results 09-29-21 to get b12 once a week for 4 weeks and once a month after that. This is the first monthly injection.  cyanocobalamin (med), 1000mg /ml (dose),  IM (route) was administered right deltoid (location) today. Patient tolerated injection.  Patient next injection due: in one month, appt made   , PA-C

## 2021-11-30 NOTE — Telephone Encounter (Signed)
Pt called and stated he is in severe pain since the inj yesterday with dr. Alvester Morin. I did advise the pt that the pain can get worse before it gets better but he stated he cant handle the pain right now. Has tried tylenol # 3 and advil with no relief. Please advise

## 2021-11-30 NOTE — Telephone Encounter (Signed)
Called patient no answer LMOM. Just need to advise on msg below.  

## 2021-11-30 NOTE — Telephone Encounter (Signed)
Patient returned call asked for a call back. Patient asked to leave a message if he can not come to the phone.   The number to contact patient is [phone]

## 2021-12-01 NOTE — Telephone Encounter (Signed)
Sent mychart msg.

## 2021-12-11 MED ORDER — TRIAMCINOLONE ACETONIDE 40 MG/ML IJ SUSP
40.0000 mg | INTRAMUSCULAR | Status: AC | PRN
  Administered 2021-11-29: 14:00:00 40 mg via INTRA_ARTICULAR

## 2021-12-11 MED ORDER — BUPIVACAINE HCL 0.25 % IJ SOLN
5.0000 mL | INTRAMUSCULAR | Status: AC | PRN
Start: 2021-11-29 — End: 2021-11-29
  Administered 2021-11-29: 14:00:00 5 mL via INTRA_ARTICULAR

## 2021-12-14 ENCOUNTER — Ambulatory Visit: Payer: Medicaid Other | Attending: Orthopaedic Surgery | Admitting: Physical Therapy

## 2021-12-14 ENCOUNTER — Telehealth: Payer: Self-pay | Admitting: Orthopaedic Surgery

## 2021-12-14 ENCOUNTER — Encounter: Payer: Self-pay | Admitting: Physical Therapy

## 2021-12-14 ENCOUNTER — Other Ambulatory Visit: Payer: Self-pay

## 2021-12-14 DIAGNOSIS — I6381 Other cerebral infarction due to occlusion or stenosis of small artery: Secondary | ICD-10-CM

## 2021-12-14 DIAGNOSIS — R600 Localized edema: Secondary | ICD-10-CM | POA: Diagnosis not present

## 2021-12-14 DIAGNOSIS — M25512 Pain in left shoulder: Secondary | ICD-10-CM | POA: Diagnosis not present

## 2021-12-14 DIAGNOSIS — M6281 Muscle weakness (generalized): Secondary | ICD-10-CM | POA: Diagnosis not present

## 2021-12-14 DIAGNOSIS — G8929 Other chronic pain: Secondary | ICD-10-CM | POA: Diagnosis not present

## 2021-12-14 DIAGNOSIS — M7502 Adhesive capsulitis of left shoulder: Secondary | ICD-10-CM | POA: Insufficient documentation

## 2021-12-14 NOTE — Therapy (Signed)
[facility] [address] [place], [place], [address] Phone: [phone]   Fax:  [phone]  Physical Therapy Evaluation  Patient Details  Name: Aaron Gibson MRN: [id number] Date of Birth: July 04, 1966 Referring Provider (PT): Tarry Kos   Encounter Date: 12/14/2021   PT End of Session - 12/14/21 1439     Visit Number 1    Number of Visits 20    Date for PT Re-Evaluation 02/22/22    PT Start Time 1100    PT Stop Time 1145    PT Time Calculation (min) 45 min    Activity Tolerance Patient limited by pain    Behavior During Therapy [facility] for tasks assessed/performed             Past Medical History:  Diagnosis Date   Hypertension    untreated   Morbid obesity (HCC)     Past Surgical History:  Procedure Laterality Date   BUBBLE STUDY  01/02/2021   Procedure: BUBBLE STUDY;  Surgeon: Meriam Sprague, MD;  Location: [facility] ENDOSCOPY;  Service: Cardiovascular;;   TEE WITHOUT CARDIOVERSION N/A 01/02/2021   Procedure: TRANSESOPHAGEAL ECHOCARDIOGRAM (TEE);  Surgeon: Meriam Sprague, MD;  Location: [facility] ENDOSCOPY;  Service: Cardiovascular;  Laterality: N/A;   UMBILICAL HERNIA REPAIR N/A 03/15/2015   Procedure: UMBILICAL HERNIA REPAIR WITH MESH;  Surgeon: Abigail Miyamoto, MD;  Location: [facility] OR;  Service: General;  Laterality: N/A;    There were no vitals filed for this visit.    Subjective Assessment - 12/14/21 1106     Subjective Patient sustained a CVA in january with L sided weakness. He developed L shoulder adhesive capsulitis. He received an injection about 2 weeks ago, but reports the pain has been worse since the injection.    Pertinent History HTN, obesity, chronic back pain.    Limitations Lifting    How long can you sit comfortably? Since the injection shoulder hurts at all times and all positions.    Patient Stated Goals Improve use of LUE, return to work    Currently in Pain? Yes    Pain Score 4     Pain  Location Shoulder    Pain Orientation Left    Pain Descriptors / Indicators Aching;Sore    Pain Type Chronic pain    Pain Onset More than a month ago    Pain Frequency Constant    Aggravating Factors  Pain has been worse since injection 2 weeks ago.    Effect of Pain on Daily Activities Unable to functionally use LUE at this time.                [facility] PT Assessment - 12/14/21 0001       Assessment   Medical Diagnosis L shoulder adhesive capsulitis    Referring Provider (PT) Roda Shutters, Naiping M    Hand Dominance Right    Next MD Visit approximately 4 weeks    Prior Therapy inpatient rehab, no HHPT      Precautions   Precautions None      Home Environment   Living Environment Private residence    Living Arrangements Children    Available Help at Discharge Family    Type of Home House    Home Access Stairs to enter    Entrance Stairs-Number of Steps 1    Entrance Stairs-Rails None    Home Layout Two level    Alternate Level Stairs-Number of Steps 12    Alternate Level Stairs-Rails  Right    Home Equipment Cane - single point;Shower seat      Posture/Postural Control   Posture Comments elevated shoulders, flattened curves      ROM / Strength   AROM / PROM / Strength AROM;PROM;Strength      AROM   Overall AROM Comments RUE WNL    AROM Assessment Site Shoulder    Right/Left Shoulder Left    Left Shoulder Extension 20 Degrees    Left Shoulder Flexion 96 Degrees    Left Shoulder ABduction 92 Degrees    Left Shoulder Internal Rotation 90 Degrees    Left Shoulder External Rotation 72 Degrees      PROM   Overall PROM Comments RUE WNL    PROM Assessment Site Shoulder    Right/Left Shoulder Left    Left Shoulder Extension 20 Degrees    Left Shoulder Flexion 101 Degrees    Left Shoulder ABduction 81 Degrees    Left Shoulder Internal Rotation 90 Degrees    Left Shoulder External Rotation 81 Degrees      Strength   Overall Strength Comments RUE strength WNL, LUE strength  limited by pain. BLE deferred.Patient reports no issues.    Strength Assessment Site Shoulder;Elbow    Right/Left Shoulder Left    Left Shoulder Flexion 3-/5    Left Shoulder Extension 3-/5    Left Shoulder ABduction 3-/5    Left Shoulder Internal Rotation 3+/5    Left Shoulder External Rotation 3/5    Right/Left Elbow Left    Left Elbow Flexion 3+/5    Left Elbow Extension 3+/5      Palpation   Palpation comment Patient was very TTP along superior spine of scapula, infraspinatus muscle, anterior shoulder joint capsule, pects on L.      Special Tests   Other special tests Patient requested BP assessment at end of treatment 159/102, HR 75 BPM                        Objective measurements completed on examination: See above findings.       [facility] Adult PT Treatment/Exercise - 12/14/21 0001       Modalities   Modalities Vasopneumatic      Vasopneumatic   Number Minutes Vasopneumatic  15 minutes    Vasopnuematic Location  Shoulder    Vasopneumatic Pressure Medium    Vasopneumatic Temperature  34      Manual Therapy   Manual Therapy Joint mobilization;Soft tissue mobilization;Passive ROM    Joint Mobilization Inferior and posterior glides for humeral head    Soft tissue mobilization pects and infraspinatus on L    Passive ROM shoulder flexion, abd, ER L                     PT Education - 12/14/21 1535     Education Details POC    Person(s) Educated Patient    Methods Explanation    Comprehension Verbalized understanding              PT Short Term Goals - 12/14/21 1608       PT SHORT TERM GOAL #1   Title I with basic HEP.    Time 4    Period Weeks    Status New    Target Date 01/11/22               PT Long Term Goals - 12/14/21 1609       PT LONG  TERM GOAL #1   Title I with final HEP.    Time 10    Period Weeks    Status New    Target Date 02/22/22      PT LONG TERM GOAL #2   Title Patient will increase FOTO score  to 69%    Baseline 55%    Time 10    Period Weeks    Status New    Target Date 02/22/22      PT LONG TERM GOAL #3   Title Patient will improve active L shoulder flexion and abduction to at least 150 degrees with pain < 4/10.    Baseline 96 flex, 92 abd, limited by pain.    Time 10    Period Weeks    Status New    Target Date 02/22/22      PT LONG TERM GOAL #4   Title Patient will increase L shoulder strength to at least 4/5 in all planes with pain < 4/10.    Baseline Strength (3-)-3/5, limited by pain    Time 10    Period Weeks    Status New    Target Date 02/22/22                    Plan - 12/14/21 1535     Clinical Impression Statement Patient referred to PT for adhesive capsulitis 2 weeks after receiving injection. He reports incresed pain since the injection. He presents with severe pain, TTP along superior and anterior L shoulder capsule, decreased strength. His humeral mobility is severely decreased in inferior and posterior glide, with decreased ROM and active use of LUE. 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 Used Vasop press for pain relief, but patient reported increased pain after.    Personal Factors and Comorbidities Comorbidity 3+;Profession;Past/Current Experience    Comorbidities HTN, obesity, chronic back pain.    Examination-Activity Limitations Carry;Dressing;Hygiene/Grooming;Lift;Bathing;Reach Overhead    Examination-Participation Restrictions Occupation;Community Activity    Stability/Clinical Decision Making Evolving/Moderate complexity    Clinical Decision Making Moderate    Rehab Potential Good    PT Frequency 2x / week    PT Duration Other (comment)   10w   PT Treatment/Interventions ADLs/Self Care Home Management;Iontophoresis 4mg /ml Dexamethasone;Taping;Moist Heat;Ultrasound;Passive range  of motion;Dry needling;Manual techniques;Therapeutic exercise;Therapeutic activities    PT Next Visit Plan Joint mobs, ROM, HEP, heat-patient did not like the cold.    Consulted and Agree with Plan of Care Patient             Patient will benefit from skilled therapeutic intervention in order to improve the following deficits and impairments:  Decreased range of motion, Increased fascial restricitons, Pain, Improper body mechanics, Postural dysfunction, Decreased mobility, Decreased strength  Visit Diagnosis: Muscle weakness (generalized)  Localized edema  Chronic left shoulder pain  Cerebrovascular accident (CVA) of right basal ganglia [facility])     Problem List Patient Active Problem List   Diagnosis Date Noted   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   Cerebrovascular accident (CVA) of right basal ganglia (HCC) 01/02/2021   CVA (cerebral vascular accident) (HCC) 12/29/2020   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 [place], [place], [address] Phone: [phone]   Fax:  [phone]  Name: Zyion Doxtater MRN: [id number] Date of Birth: 03-23-1966

## 2021-12-14 NOTE — Telephone Encounter (Signed)
Patient called. He would like some pain medication called in for him. His call back number is [phone]. Says the tylenol did not work.

## 2021-12-19 ENCOUNTER — Encounter: Payer: Self-pay | Admitting: Orthopaedic Surgery

## 2021-12-20 ENCOUNTER — Other Ambulatory Visit: Payer: Self-pay | Admitting: Physician Assistant

## 2021-12-20 MED ORDER — TRAMADOL HCL 50 MG PO TABS: 50.0000 mg | ORAL_TABLET | Freq: Three times a day (TID) | ORAL | 0 refills | Status: DC | PRN

## 2021-12-20 NOTE — Telephone Encounter (Signed)
I don't recall receiving a refill request. I just sent in tramadol.  If he needs something stronger, will need to get from pcp or we can refer to pain mgmt.

## 2021-12-20 NOTE — Telephone Encounter (Signed)
Pt called stating he checked with his pharmacy and they didn't receive the rx. Pt would like to have the rx resent and would like a CB to be updated when that's done.   [phone]

## 2021-12-21 ENCOUNTER — Telehealth: Payer: Self-pay

## 2021-12-21 NOTE — Telephone Encounter (Signed)
Pt called and he states that he would like oxy 10 to be sent in instead of the tramadol.

## 2021-12-21 NOTE — Telephone Encounter (Signed)
Patient called back into the office regarding the message below

## 2021-12-21 NOTE — Telephone Encounter (Signed)
Patient states the Hydrocodone and tramadol are not working and his orthopedic doctor advised him to reach out to PCP in order to get the Oxy 10 that the pharmacy recommended. Pt states he starts physical therapy this weekend and would like the medication to help with the pain. Please advise.     [facility] [address] [place], [place] - [address] AT [facility]  [address], [place] [address]  Phone:  [phone]  Fax:  [phone]

## 2021-12-22 ENCOUNTER — Telehealth: Payer: Self-pay | Admitting: Medical

## 2021-12-22 MED ORDER — METHYLPREDNISOLONE 4 MG PO TABS: ORAL_TABLET | ORAL | 0 refills | Status: DC

## 2021-12-22 MED ORDER — CYCLOBENZAPRINE HCL 5 MG PO TABS: 5.0000 mg | ORAL_TABLET | Freq: Every day | ORAL | 0 refills | Status: DC

## 2021-12-22 NOTE — Telephone Encounter (Signed)
Pt states that his he got a cortisone injection one month ago. His pain got worse.  Failed hydrocodone Ortho called in tramadol. Pharmacist told him that would not work. Stronger narcotic such as oxycodone not indicated presently.   Pt range of motion is still limited. He has adhesive capsulitis.   Pt is diabetic. Last a1c was 6.6. Today sugar level was 130. Just ate one hour ago.  Will rx medrol 6 day dose pack. Advise to take tramadol/fill rx that ortho prescribed. Flexeril. Explained in my opinion tramadol better for nerve pain compared to hydocodone. Don't use tramadol and hydrocodone together.  Check sugars daily and eat low sugar diet. If sugar go above 200 then increase metformin to 2 tab po bid. If sugsars were to increase over 400 then ED evaluation.  Give me update on Tuesday.  After hours end of day 20 minutes or more spent talking to pt and documentig.  Esperanza Richters, PA-C

## 2021-12-22 NOTE — Telephone Encounter (Signed)
Spoke with patient. Pt states that the [facility] 5mg  didn't help with his pain and states the Ortho called in Tramadol and he was advised by the pharmacist that "If the [facility] didn't help, then the Tramadol wouldn't help". He was advised by the pharmacy to contact prescribing provider. Pt did and was told that Ortho would not increase medication and to contact his PCP. Pt was advised Per [facility] for patient to schedule appointment to discuss options but he states he has PT this weekend and is concerned about the pain after therapy.

## 2021-12-22 NOTE — Telephone Encounter (Signed)
Please review thread and response from pt's ortho doc. Please advise

## 2021-12-22 NOTE — Telephone Encounter (Signed)
Pt is calling back regarding issue.

## 2021-12-22 NOTE — Telephone Encounter (Signed)
I reviewed his chart and oxy 10 is inappropriate for adhesive capsulitis.

## 2021-12-26 ENCOUNTER — Ambulatory Visit: Payer: Medicaid Other | Attending: Orthopaedic Surgery | Admitting: Physical Therapy

## 2021-12-26 ENCOUNTER — Ambulatory Visit: Payer: Self-pay | Admitting: Physical Therapy

## 2021-12-26 ENCOUNTER — Other Ambulatory Visit: Payer: Self-pay

## 2021-12-26 DIAGNOSIS — I6381 Other cerebral infarction due to occlusion or stenosis of small artery: Secondary | ICD-10-CM | POA: Insufficient documentation

## 2021-12-26 DIAGNOSIS — R278 Other lack of coordination: Secondary | ICD-10-CM | POA: Diagnosis not present

## 2021-12-26 DIAGNOSIS — G8929 Other chronic pain: Secondary | ICD-10-CM | POA: Diagnosis not present

## 2021-12-26 DIAGNOSIS — R600 Localized edema: Secondary | ICD-10-CM | POA: Diagnosis not present

## 2021-12-26 DIAGNOSIS — M6281 Muscle weakness (generalized): Secondary | ICD-10-CM | POA: Diagnosis not present

## 2021-12-26 DIAGNOSIS — M25512 Pain in left shoulder: Secondary | ICD-10-CM | POA: Insufficient documentation

## 2021-12-26 DIAGNOSIS — M79602 Pain in left arm: Secondary | ICD-10-CM | POA: Insufficient documentation

## 2021-12-26 NOTE — Patient Instructions (Signed)
[id number]

## 2021-12-26 NOTE — Therapy (Signed)
[facility] [address] [place], [place], [address] Phone: [phone]   Fax:  [phone]  Physical Therapy Treatment  Patient Details  Name: Aaron Gibson MRN: [id number] Date of Birth: 1966/02/28 Referring Provider (PT): Tarry Kos   Encounter Date: 12/26/2021   PT End of Session - 12/26/21 1304     Visit Number 2    Number of Visits 20    Date for PT Re-Evaluation 02/22/22    PT Start Time 1230    PT Stop Time 1315    PT Time Calculation (min) 45 min             Past Medical History:  Diagnosis Date   Hypertension    untreated   Morbid obesity [facility])     Past Surgical History:  Procedure Laterality Date   BUBBLE STUDY  01/02/2021   Procedure: BUBBLE STUDY;  Surgeon: Meriam Sprague, MD;  Location: [facility] ENDOSCOPY;  Service: Cardiovascular;;   TEE WITHOUT CARDIOVERSION N/A 01/02/2021   Procedure: TRANSESOPHAGEAL ECHOCARDIOGRAM (TEE);  Surgeon: Meriam Sprague, MD;  Location: [facility] ENDOSCOPY;  Service: Cardiovascular;  Laterality: N/A;   UMBILICAL HERNIA REPAIR N/A 03/15/2015   Procedure: UMBILICAL HERNIA REPAIR WITH MESH;  Surgeon: Abigail Miyamoto, MD;  Location: [facility] OR;  Service: General;  Laterality: N/A;    There were no vitals filed for this visit.   Subjective Assessment - 12/26/21 1229     Subjective 4/10 at rest then increases with mvmt. injection made it worse. ice makes it hurt worse                               [facility] Adult PT Treatment/Exercise - 12/26/21 0001       Exercises   Exercises Shoulder;Neck      Neck Exercises: Machines for Strengthening   UBE (Upper Arm Bike) L 2 2 min fwd/2 min back      Shoulder Exercises: Supine   External Rotation Strengthening;Left;10 reps    External Rotation Weight (lbs) 1    Internal Rotation Strengthening;Left;10 reps    Internal Rotation Weight (lbs) 1    Flexion Strengthening;Left;10 reps    Shoulder Flexion Weight (lbs)  1      Shoulder Exercises: Standing   ABduction Strengthening;Both;10 reps    Theraband Level (Shoulder ABduction) Level 2 (Red)    Extension Strengthening;Both;10 reps    Theraband Level (Shoulder Extension) Level 2 (Red)    Row Strengthening;Both;10 reps    Theraband Level (Shoulder Row) Level 3 (Green)    Other Standing Exercises finger ladder flexion 5 x    Other Standing Exercises cane ex 5 ways 1 # bar 10 each      Manual Therapy   Manual Therapy Joint mobilization;Soft tissue mobilization;Passive ROM    Manual therapy comments pain limited with soft end feel ,increased muslce guarding and pain limiting ROM   very tender over bicep incertion   Joint Mobilization ant,Inferior and posterior glides for humeral head    Soft tissue mobilization pects and infraspinatus on L    Passive ROM shoulder flexion, abd, ER and IR LUE                     PT Education - 12/26/21 1246     Education Details [id number] cane ex for ROM    Person(s) Educated Patient    Methods Explanation;Demonstration;Handout    Comprehension Verbalized understanding;Returned  demonstration              PT Short Term Goals - 12/14/21 1608       PT SHORT TERM GOAL #1   Title I with basic HEP.    Time 4    Period Weeks    Status New    Target Date 01/11/22               PT Long Term Goals - 12/14/21 1609       PT LONG TERM GOAL #1   Title I with final HEP.    Time 10    Period Weeks    Status New    Target Date 02/22/22      PT LONG TERM GOAL #2   Title Patient will increase FOTO score to 69%    Baseline 55%    Time 10    Period Weeks    Status New    Target Date 02/22/22      PT LONG TERM GOAL #3   Title Patient will improve active L shoulder flexion and abduction to at least 150 degrees with pain < 4/10.    Baseline 96 flex, 92 abd, limited by pain.    Time 10    Period Weeks    Status New    Target Date 02/22/22      PT LONG TERM GOAL #4   Title Patient will  increase L shoulder strength to at least 4/5 in all planes with pain < 4/10.    Baseline Strength (3-)-3/5, limited by pain    Time 10    Period Weeks    Status New    Target Date 02/22/22                   Plan - 12/26/21 1256     Clinical Impression Statement issued initial HEP ,cane ex standing to initiate AAROM. progressed as tolerated . pt is very guarded and pain limited. PROM is soft end feel. pt presses on shld with PROM an dstates this allows for more ROM    Examination-Activity Limitations Carry;Dressing;Hygiene/Grooming;Lift;Bathing;Reach Overhead    PT Treatment/Interventions ADLs/Self Care Home Management;Iontophoresis 4mg /ml Dexamethasone;Taping;Moist Heat;Ultrasound;Passive range of motion;Dry needling;Manual techniques;Therapeutic exercise;Therapeutic activities    PT Next Visit Plan Joint mobs, ROM, HEP, heat-patient did not like the cold.             Patient will benefit from skilled therapeutic intervention in order to improve the following deficits and impairments:  Decreased range of motion, Increased fascial restricitons, Pain, Improper body mechanics, Postural dysfunction, Decreased mobility, Decreased strength  Visit Diagnosis: Muscle weakness (generalized)  Chronic left shoulder pain     Problem List Patient Active Problem List   Diagnosis Date Noted   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   Cerebrovascular accident (CVA) of right basal ganglia (HCC) 01/02/2021   CVA (cerebral vascular accident) (HCC) 12/29/2020   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 [place], [place], [place] Phone: [phone]   Fax:  [phone]  Name: Abdi Husak MRN: Carole Civil Date of Birth:  [birth year]

## 2021-12-27 NOTE — Telephone Encounter (Signed)
Thank you for the referrals.

## 2021-12-27 NOTE — Telephone Encounter (Signed)
Thanks for the note.  I reviewed his chart and based on MRI and exam findings Oxy 10 is not indicated.  If he feels that he needs something that strong to treat his shoulder pain, then he's got more issues going on than just the shoulder so I agree with a referral to pain management or Short sports med.  Have a nice day.  Thank you

## 2021-12-28 ENCOUNTER — Ambulatory Visit: Payer: Medicaid Other | Admitting: Physical Therapy

## 2021-12-28 ENCOUNTER — Encounter: Payer: Self-pay | Admitting: Physical Therapy

## 2021-12-28 ENCOUNTER — Other Ambulatory Visit: Payer: Self-pay

## 2021-12-28 DIAGNOSIS — R278 Other lack of coordination: Secondary | ICD-10-CM | POA: Diagnosis not present

## 2021-12-28 DIAGNOSIS — G8929 Other chronic pain: Secondary | ICD-10-CM

## 2021-12-28 DIAGNOSIS — R600 Localized edema: Secondary | ICD-10-CM | POA: Diagnosis not present

## 2021-12-28 DIAGNOSIS — M6281 Muscle weakness (generalized): Secondary | ICD-10-CM

## 2021-12-28 DIAGNOSIS — I6381 Other cerebral infarction due to occlusion or stenosis of small artery: Secondary | ICD-10-CM | POA: Diagnosis not present

## 2021-12-28 DIAGNOSIS — M25512 Pain in left shoulder: Secondary | ICD-10-CM | POA: Diagnosis not present

## 2021-12-28 DIAGNOSIS — M79602 Pain in left arm: Secondary | ICD-10-CM | POA: Diagnosis not present

## 2021-12-28 NOTE — Therapy (Signed)
[facility] [address] [place], [place], [address] Phone: [phone]   Fax:  [phone]  Physical Therapy Treatment  Patient Details  Name: Kalel Harty MRN: [id number] Date of Birth: March 29, 1966 Referring Provider (PT): Tarry Kos   Encounter Date: 12/28/2021   PT End of Session - 12/28/21 1211     Visit Number 3    Number of Visits 20    Date for PT Re-Evaluation 02/22/22    PT Start Time 1107    PT Stop Time 1145    PT Time Calculation (min) 38 min    Activity Tolerance Patient limited by pain    Behavior During Therapy [facility] for tasks assessed/performed             Past Medical History:  Diagnosis Date   Hypertension    untreated   Morbid obesity (HCC)     Past Surgical History:  Procedure Laterality Date   BUBBLE STUDY  01/02/2021   Procedure: BUBBLE STUDY;  Surgeon: Meriam Sprague, MD;  Location: [facility] ENDOSCOPY;  Service: Cardiovascular;;   TEE WITHOUT CARDIOVERSION N/A 01/02/2021   Procedure: TRANSESOPHAGEAL ECHOCARDIOGRAM (TEE);  Surgeon: Meriam Sprague, MD;  Location: [facility] ENDOSCOPY;  Service: Cardiovascular;  Laterality: N/A;   UMBILICAL HERNIA REPAIR N/A 03/15/2015   Procedure: UMBILICAL HERNIA REPAIR WITH MESH;  Surgeon: Abigail Miyamoto, MD;  Location: [facility] OR;  Service: General;  Laterality: N/A;    There were no vitals filed for this visit.   Subjective Assessment - 12/28/21 1113     Subjective Still having pain in his L shoulder aobut 3-4 at rest. He reports that he tends to hold it in tight against his body at rest. He can move the arm more passively than he can actively.    Pertinent History HTN, obesity, chronic back pain.    Limitations Lifting    How long can you sit comfortably? Since the injection shoulder hurts at all times and all positions.    Patient Stated Goals Improve use of LUE, return to work    Currently in Pain? Yes    Pain Score 4     Pain Location Shoulder    Pain  Orientation Left    Pain Descriptors / Indicators Aching;Sore    Pain Type Chronic pain    Pain Onset More than a month ago    Pain Frequency Constant                               [facility] Adult PT Treatment/Exercise - 12/28/21 0001       Shoulder Exercises: Seated   Flexion AROM;Left;5 reps    Abduction AROM;Left;5 reps    Other Seated Exercises Posterior depression- hands bahind hips, in ER, push through hands with shoulder depression, scapular retraction. 5 reps, 5 sec holds.      Modalities   Modalities Iontophoresis      Iontophoresis   Type of Iontophoresis Dexamethasone    Location L ant shoulder    Dose 1.2cc    Time 4 hours      Manual Therapy   Manual Therapy Joint mobilization;Soft tissue mobilization;Passive ROM    Joint Mobilization ant,Inferior and posterior glides for humeral head    Soft tissue mobilization Cross friction massage to L supraspinatus and biceps tendons, STM to pect, UT    Passive ROM shoulder flexion, abd, ER and IR LUE  PT Education - 12/28/21 1155     Education Details Therapist initiated HEP, but patient reports and then demonstrated multiple exercises he is already performing, including theraband resisted shoulder ER, rows, ext, flex, and abd. He also performs stretches using doorway. Therapist encouraged him to continue with his current HEP, add AROM for shoulder flex and abd within his painfree ROm.    Person(s) Educated Patient    Methods Explanation;Demonstration    Comprehension Returned demonstration;Verbalized understanding              PT Short Term Goals - 12/28/21 1116       PT SHORT TERM GOAL #1   Title I with basic HEP.    Baseline initiated    Time 3    Period Weeks    Status On-going    Target Date 01/11/22               PT Long Term Goals - 12/14/21 1609       PT LONG TERM GOAL #1   Title I with final HEP.    Time 10    Period Weeks    Status New     Target Date 02/22/22      PT LONG TERM GOAL #2   Title Patient will increase FOTO score to 69%    Baseline 55%    Time 10    Period Weeks    Status New    Target Date 02/22/22      PT LONG TERM GOAL #3   Title Patient will improve active L shoulder flexion and abduction to at least 150 degrees with pain < 4/10.    Baseline 96 flex, 92 abd, limited by pain.    Time 10    Period Weeks    Status New    Target Date 02/22/22      PT LONG TERM GOAL #4   Title Patient will increase L shoulder strength to at least 4/5 in all planes with pain < 4/10.    Baseline Strength (3-)-3/5, limited by pain    Time 10    Period Weeks    Status New    Target Date 02/22/22                   Plan - 12/28/21 1211     Clinical Impression Statement Patient has extensive HEP from the past which he is continuing to perform. He reports continued L ant shoulder pain, remains very TTP in bicep tendon and supraspinatus tendon. Therapist performed cross frictin massage to both tendons as well as STM to pects and UT followed by stretch and PROM for shoulder flexion and abd, and ER. Patient performed some strengthening and finished with Iontophoresis for pain relief.    Personal Factors and Comorbidities Comorbidity 3+;Profession;Past/Current Experience    Examination-Activity Limitations Carry;Dressing;Hygiene/Grooming;Lift;Bathing;Reach Overhead    Clinical Decision Making Moderate    Rehab Potential Good    PT Frequency 2x / week    PT Duration 8 weeks    PT Treatment/Interventions ADLs/Self Care Home Management;Iontophoresis 4mg /ml Dexamethasone;Taping;Moist Heat;Ultrasound;Passive range of motion;Dry needling;Manual techniques;Therapeutic exercise;Therapeutic activities    PT Next Visit Plan Assess tolerance of Ionto, strengthening, STM    PT Home Exercise Plan Patient with extensive HEP from previous round of therapy, including theraband resisted shoulder ER, rows, ext, flex, and abd. He also  performs stretches using doorway. Therapist encouraged him to continue with his current HEP, add AROM for shoulder flex and abd within his painfree  ROM.    Consulted and Agree with Plan of Care Patient             Patient will benefit from skilled therapeutic intervention in order to improve the following deficits and impairments:  Decreased range of motion, Increased fascial restricitons, Pain, Improper body mechanics, Postural dysfunction, Decreased mobility, Decreased strength  Visit Diagnosis: Muscle weakness (generalized)  Chronic left shoulder pain  Localized edema     Problem List Patient Active Problem List   Diagnosis Date Noted   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   Cerebrovascular accident (CVA) of right basal ganglia (HCC) 01/02/2021   CVA (cerebral vascular accident) (HCC) 12/29/2020   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 [place], [place], [address] Phone: [phone]   Fax:  [phone]  Name: Carole CivilRandy Vigilante MRN: [id number] Date of Birth: 05/08/1966

## 2022-01-02 ENCOUNTER — Ambulatory Visit: Payer: Medicaid Other | Admitting: Physical Therapy

## 2022-01-02 ENCOUNTER — Ambulatory Visit (INDEPENDENT_AMBULATORY_CARE_PROVIDER_SITE_OTHER): Payer: Medicaid Other

## 2022-01-02 ENCOUNTER — Other Ambulatory Visit: Payer: Self-pay

## 2022-01-02 ENCOUNTER — Encounter: Payer: Self-pay | Admitting: Physical Therapy

## 2022-01-02 DIAGNOSIS — I6381 Other cerebral infarction due to occlusion or stenosis of small artery: Secondary | ICD-10-CM | POA: Diagnosis not present

## 2022-01-02 DIAGNOSIS — R600 Localized edema: Secondary | ICD-10-CM

## 2022-01-02 DIAGNOSIS — M25512 Pain in left shoulder: Secondary | ICD-10-CM

## 2022-01-02 DIAGNOSIS — R278 Other lack of coordination: Secondary | ICD-10-CM | POA: Diagnosis not present

## 2022-01-02 DIAGNOSIS — E538 Deficiency of other specified B group vitamins: Secondary | ICD-10-CM

## 2022-01-02 DIAGNOSIS — M6281 Muscle weakness (generalized): Secondary | ICD-10-CM

## 2022-01-02 DIAGNOSIS — M79602 Pain in left arm: Secondary | ICD-10-CM

## 2022-01-02 DIAGNOSIS — G8929 Other chronic pain: Secondary | ICD-10-CM

## 2022-01-02 MED ORDER — CYANOCOBALAMIN 1000 MCG/ML IJ SOLN
1000.0000 ug | Freq: Once | INTRAMUSCULAR | Status: AC
  Administered 2022-01-02: 1000 ug via INTRAMUSCULAR

## 2022-01-02 NOTE — Therapy (Signed)
[facility] [address] [place], [place], [address] Phone: [phone]   Fax:  [phone]  Physical Therapy Treatment  Patient Details  Name: Aaron Gibson MRN: [id number] Date of Birth: 1966/01/17 Referring Provider (PT): Tarry Kos   Encounter Date: 01/02/2022   PT End of Session - 01/02/22 1416     Visit Number 4    Number of Visits 20    Date for PT Re-Evaluation 02/22/22    PT Start Time 1251    PT Stop Time 1320    PT Time Calculation (min) 29 min    Activity Tolerance Patient limited by pain    Behavior During Therapy [facility] for tasks assessed/performed             Past Medical History:  Diagnosis Date   Hypertension    untreated   Morbid obesity (HCC)     Past Surgical History:  Procedure Laterality Date   BUBBLE STUDY  01/02/2021   Procedure: BUBBLE STUDY;  Surgeon: Meriam Sprague, MD;  Location: [facility] ENDOSCOPY;  Service: Cardiovascular;;   TEE WITHOUT CARDIOVERSION N/A 01/02/2021   Procedure: TRANSESOPHAGEAL ECHOCARDIOGRAM (TEE);  Surgeon: Meriam Sprague, MD;  Location: [facility] ENDOSCOPY;  Service: Cardiovascular;  Laterality: N/A;   UMBILICAL HERNIA REPAIR N/A 03/15/2015   Procedure: UMBILICAL HERNIA REPAIR WITH MESH;  Surgeon: Abigail Miyamoto, MD;  Location: [facility] OR;  Service: General;  Laterality: N/A;    There were no vitals filed for this visit.   Subjective Assessment - 01/02/22 1254     Subjective L shoulder feels better, more loose.    Pertinent History HTN, obesity, chronic back pain.    Limitations Lifting    How long can you sit comfortably? Since the injection shoulder hurts at all times and all positions.    Patient Stated Goals Improve use of LUE, return to work    Currently in Pain? Yes    Pain Score 4     Pain Location Shoulder    Pain Orientation Left    Pain Descriptors / Indicators Aching    Pain Type Chronic pain    Pain Onset More than a month ago                                [facility] Adult PT Treatment/Exercise - 01/02/22 0001       Neck Exercises: Machines for Strengthening   UBE (Upper Arm Bike) L 1.5, 2 min fwd/2 min back      Modalities   Modalities Iontophoresis      Iontophoresis   Type of Iontophoresis Dexamethasone    Location L ant shoulder    Dose 1.2cc    Time 4 hours      Manual Therapy   Manual Therapy Joint mobilization;Soft tissue mobilization;Passive ROM    Joint Mobilization ant,Inferior and posterior glides for humeral head    Soft tissue mobilization Cross friction massage to L supraspinatus and biceps tendons, STM to pect, UT    Passive ROM shoulder flexion, abd, ER and IR LUE              Trigger Point Dry Needling - 01/02/22 0001     Consent Given? Yes    Education Handout Provided Yes    Muscles Treated Upper Quadrant Pectoralis major    Pectoralis Major Response Twitch response elicited;Palpable increased muscle length  PT Education - 01/02/22 1415     Education Details Updated HEp, benefits of DN.    Person(s) Educated Patient    Methods Explanation;Demonstration    Comprehension Verbalized understanding;Returned demonstration              PT Short Term Goals - 01/02/22 1419       PT SHORT TERM GOAL #1   Title I with basic HEP.    Baseline initiated    Time 3    Period Weeks    Status On-going    Target Date 01/11/22               PT Long Term Goals - 12/14/21 1609       PT LONG TERM GOAL #1   Title I with final HEP.    Time 10    Period Weeks    Status New    Target Date 02/22/22      PT LONG TERM GOAL #2   Title Patient will increase FOTO score to 69%    Baseline 55%    Time 10    Period Weeks    Status New    Target Date 02/22/22      PT LONG TERM GOAL #3   Title Patient will improve active L shoulder flexion and abduction to at least 150 degrees with pain < 4/10.    Baseline 96 flex, 92 abd, limited by pain.     Time 10    Period Weeks    Status New    Target Date 02/22/22      PT LONG TERM GOAL #4   Title Patient will increase L shoulder strength to at least 4/5 in all planes with pain < 4/10.    Baseline Strength (3-)-3/5, limited by pain    Time 10    Period Weeks    Status New    Target Date 02/22/22                   Plan - 01/02/22 1416     Clinical Impression Statement Patient arrived late today due to transportation issues. Therapist performed extensive STM to L ant shoulder structures, F/B stretch into flexion and abd. He has a very tight band of tissue in pects. receives DN to area. Finished with Iontophoresis as he reports this seemed to help him last treatment. He declined ice.    Personal Factors and Comorbidities Comorbidity 3+;Profession;Past/Current Experience    Examination-Activity Limitations Carry;Dressing;Hygiene/Grooming;Lift;Bathing;Reach Overhead    Stability/Clinical Decision Making Evolving/Moderate complexity    Clinical Decision Making Moderate    Rehab Potential Good    PT Frequency 2x / week    PT Duration 8 weeks    PT Treatment/Interventions ADLs/Self Care Home Management;Iontophoresis 4mg /ml Dexamethasone;Taping;Moist Heat;Ultrasound;Passive range of motion;Dry needling;Manual techniques;Therapeutic exercise;Therapeutic activities    PT Next Visit Plan Progress iwth STM stretch and strengthen as tolerated.    PT Home Exercise Plan Patient with extensive HEP from previous round of therapy, including theraband resisted shoulder ER, rows, ext, flex, and abd. He also performs stretches using doorway. Therapist encouraged him to continue with his current HEP, add AROM for shoulder flex and abd within his painfree ROM.    Consulted and Agree with Plan of Care Patient             Patient will benefit from skilled therapeutic intervention in order to improve the following deficits and impairments:  Decreased range of motion, Increased fascial  restricitons, Pain, Improper body  mechanics, Postural dysfunction, Decreased mobility, Decreased strength  Visit Diagnosis: Muscle weakness (generalized)  Localized edema  Cerebrovascular accident (CVA) of right basal ganglia (HCC)  Other lack of coordination  Chronic left shoulder pain  Pain in left arm     Problem List Patient Active Problem List   Diagnosis Date Noted   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   Cerebrovascular accident (CVA) of right basal ganglia (HCC) 01/02/2021   CVA (cerebral vascular accident) (HCC) 12/29/2020   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 [place], [place], [address] Phone: [phone]   Fax:  [phone]  Name: Aaron Gibson MRN: [id number] Date of Birth: 1966/11/17

## 2022-01-02 NOTE — Progress Notes (Addendum)
Pt here for monthly B12 injection per Marisue Brooklyn  B12 given IM, left deltoid and pt tolerated injection well.  Next B12 injection scheduled for next month  [employer], [employer]

## 2022-01-03 ENCOUNTER — Ambulatory Visit: Payer: Medicaid Other | Admitting: Orthopaedic Surgery

## 2022-01-04 ENCOUNTER — Ambulatory Visit: Payer: Medicaid Other | Admitting: Physical Therapy

## 2022-01-09 ENCOUNTER — Other Ambulatory Visit: Payer: Self-pay

## 2022-01-09 ENCOUNTER — Encounter: Payer: Self-pay | Admitting: Physical Therapy

## 2022-01-09 ENCOUNTER — Ambulatory Visit: Payer: Medicaid Other | Admitting: Physical Therapy

## 2022-01-09 DIAGNOSIS — R278 Other lack of coordination: Secondary | ICD-10-CM

## 2022-01-09 DIAGNOSIS — M25512 Pain in left shoulder: Secondary | ICD-10-CM | POA: Diagnosis not present

## 2022-01-09 DIAGNOSIS — M6281 Muscle weakness (generalized): Secondary | ICD-10-CM

## 2022-01-09 DIAGNOSIS — R600 Localized edema: Secondary | ICD-10-CM | POA: Diagnosis not present

## 2022-01-09 DIAGNOSIS — I6381 Other cerebral infarction due to occlusion or stenosis of small artery: Secondary | ICD-10-CM | POA: Diagnosis not present

## 2022-01-09 DIAGNOSIS — G8929 Other chronic pain: Secondary | ICD-10-CM

## 2022-01-09 DIAGNOSIS — M79602 Pain in left arm: Secondary | ICD-10-CM | POA: Diagnosis not present

## 2022-01-09 NOTE — Therapy (Signed)
[facility] [address] [place], [place], [address] Phone: [phone]   Fax:  [phone]  Physical Therapy Treatment  Patient Details  Name: Aaron Gibson MRN: [id number] Date of Birth: 1966-04-22 Referring Provider (PT): Tarry Kos   Encounter Date: 01/09/2022   PT End of Session - 01/09/22 1256     Visit Number 5    Number of Visits 20    Date for PT Re-Evaluation 02/22/22    PT Start Time 1212    PT Stop Time 1252    PT Time Calculation (min) 40 min    Activity Tolerance Patient limited by pain    Behavior During Therapy [facility] for tasks assessed/performed             Past Medical History:  Diagnosis Date   Hypertension    untreated   Morbid obesity [facility])     Past Surgical History:  Procedure Laterality Date   BUBBLE STUDY  01/02/2021   Procedure: BUBBLE STUDY;  Surgeon: Meriam Sprague, MD;  Location: [facility] ENDOSCOPY;  Service: Cardiovascular;;   TEE WITHOUT CARDIOVERSION N/A 01/02/2021   Procedure: TRANSESOPHAGEAL ECHOCARDIOGRAM (TEE);  Surgeon: Meriam Sprague, MD;  Location: [facility] ENDOSCOPY;  Service: Cardiovascular;  Laterality: N/A;   UMBILICAL HERNIA REPAIR N/A 03/15/2015   Procedure: UMBILICAL HERNIA REPAIR WITH MESH;  Surgeon: Abigail Miyamoto, MD;  Location: [facility] OR;  Service: General;  Laterality: N/A;    There were no vitals filed for this visit.   Subjective Assessment - 01/09/22 1211     Pertinent History HTN, obesity, chronic back pain.    Limitations Lifting    How long can you sit comfortably? Feels better with the therapy. Very minimal pain at rest, still has some pain with elevation, but his ROM has increased and pain has decreased.    Currently in Pain? No/denies                               [facility] Adult PT Treatment/Exercise - 01/09/22 0001       Neck Exercises: Machines for Strengthening   UBE (Upper Arm Bike) L 1.5, 2 min fwd/2 min back      Shoulder  Exercises: Seated   Other Seated Exercises B shoulder depression, with hands on armrests of chair, pushing to lift trunk,ovng scapulae into depression. 5 reps.      Shoulder Exercises: Standing   Other Standing Exercises Cabinet taps with 2# weight, 10 reps.      Shoulder Exercises: Stretch   Internal Rotation Stretch 3 reps    Internal Rotation Stretch Limitations using dowell to pull L hand across trunk in back.    Wall Stretch - Flexion 3 reps;20 seconds    Wall Stretch - Flexion Limitations tends to turn into scaption.      Manual Therapy   Manual Therapy Soft tissue mobilization;Passive ROM    Soft tissue mobilization Deep tissue mobilizatoin to L pectoralis F/B stretch.    Passive ROM L shoulder flexion                     PT Education - 01/09/22 1254     Education Details Patient caustioned to not use weigths or resistance which is too heavy while strengthening shoulder.    Person(s) Educated Patient    Methods Explanation    Comprehension Verbalized understanding  PT Short Term Goals - 01/02/22 1419       PT SHORT TERM GOAL #1   Title I with basic HEP.    Baseline initiated    Time 3    Period Weeks    Status On-going    Target Date 01/11/22               PT Long Term Goals - 12/14/21 1609       PT LONG TERM GOAL #1   Title I with final HEP.    Time 10    Period Weeks    Status New    Target Date 02/22/22      PT LONG TERM GOAL #2   Title Patient will increase FOTO score to 69%    Baseline 55%    Time 10    Period Weeks    Status New    Target Date 02/22/22      PT LONG TERM GOAL #3   Title Patient will improve active L shoulder flexion and abduction to at least 150 degrees with pain < 4/10.    Baseline 96 flex, 92 abd, limited by pain.    Time 10    Period Weeks    Status New    Target Date 02/22/22      PT LONG TERM GOAL #4   Title Patient will increase L shoulder strength to at least 4/5 in all planes with  pain < 4/10.    Baseline Strength (3-)-3/5, limited by pain    Time 10    Period Weeks    Status New    Target Date 02/22/22                   Plan - 01/09/22 1256     Clinical Impression Statement Patient reports improved pain in L shoulder and it feels less stiff, with increased ROM. He still feels the pinch in shoulder with flexion. Therapist performed STM, noting a tight knot in pectoralis. Patient then performed self ROM and strengthening for shoulder and scapular stabilizers.    Personal Factors and Comorbidities Comorbidity 3+;Profession;Past/Current Experience    Examination-Activity Limitations Carry;Dressing;Hygiene/Grooming;Lift;Bathing;Reach Overhead    Stability/Clinical Decision Making Evolving/Moderate complexity    Clinical Decision Making Moderate    Rehab Potential Good    PT Frequency 2x / week    PT Duration 8 weeks    PT Treatment/Interventions ADLs/Self Care Home Management;Iontophoresis 4mg /ml Dexamethasone;Taping;Moist Heat;Ultrasound;Passive range of motion;Dry needling;Manual techniques;Therapeutic exercise;Therapeutic activities    PT Next Visit Plan Progress with STM, stretch, and strengthen as tolerated. Re-assess ROm and stregnth in L shoulder.    PT Home Exercise Plan Continue with current HEP, added IR with dowel or towel, seated shoulder depression.    Consulted and Agree with Plan of Care Patient             Patient will benefit from skilled therapeutic intervention in order to improve the following deficits and impairments:  Decreased range of motion, Increased fascial restricitons, Pain, Improper body mechanics, Postural dysfunction, Decreased mobility, Decreased strength  Visit Diagnosis: Muscle weakness (generalized)  Localized edema  Other lack of coordination  Chronic left shoulder pain  Pain in left arm     Problem List Patient Active Problem List   Diagnosis Date Noted   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    Cerebrovascular accident (CVA) of right basal ganglia (HCC) 01/02/2021   CVA (cerebral vascular accident) (HCC) 12/29/2020   Cryptogenic right basal ganglia stroke  likely secondary to small vessel disease  12/28/2020   Chest pain, musculoskeletal 12/28/2020   Morbid obesity (HCC)    Hypertension     Iona BeardSusan M Maeva Dant, DPT 01/09/2022, 1:03 PM  [facility] [address] [place], [place], [id number] Phone: [phone]   Fax:  [phone]  Name: Aaron Gibson MRN: [id number] Date of Birth: [birth year]

## 2022-01-11 ENCOUNTER — Encounter: Payer: Self-pay | Admitting: Physical Therapy

## 2022-01-11 ENCOUNTER — Ambulatory Visit: Payer: Medicaid Other | Admitting: Physical Therapy

## 2022-01-11 ENCOUNTER — Other Ambulatory Visit: Payer: Self-pay

## 2022-01-11 DIAGNOSIS — M25512 Pain in left shoulder: Secondary | ICD-10-CM | POA: Diagnosis not present

## 2022-01-11 DIAGNOSIS — M79602 Pain in left arm: Secondary | ICD-10-CM | POA: Diagnosis not present

## 2022-01-11 DIAGNOSIS — R600 Localized edema: Secondary | ICD-10-CM | POA: Diagnosis not present

## 2022-01-11 DIAGNOSIS — M6281 Muscle weakness (generalized): Secondary | ICD-10-CM

## 2022-01-11 DIAGNOSIS — G8929 Other chronic pain: Secondary | ICD-10-CM

## 2022-01-11 DIAGNOSIS — I6381 Other cerebral infarction due to occlusion or stenosis of small artery: Secondary | ICD-10-CM | POA: Diagnosis not present

## 2022-01-11 DIAGNOSIS — R278 Other lack of coordination: Secondary | ICD-10-CM

## 2022-01-11 NOTE — Therapy (Signed)
[place]. [place], [place], [address] Phone: [phone]   Fax:  [phone]  Physical Therapy Treatment  Patient Details  Name: Aaron Gibson MRN: [id number] Date of Birth: 21-Sep-1966 Referring Provider (PT): Leandrew Koyanagi   Encounter Date: 01/11/2022   PT End of Session - 01/11/22 1148     Visit Number 6    Number of Visits 20    Date for PT Re-Evaluation 02/22/22    PT Start Time 1104    PT Stop Time 1144    PT Time Calculation (min) 40 min    Activity Tolerance Patient limited by pain    Behavior During Therapy [facility] for tasks assessed/performed             Past Medical History:  Diagnosis Date   Hypertension    untreated   Morbid obesity ([place])     Past Surgical History:  Procedure Laterality Date   BUBBLE STUDY  01/02/2021   Procedure: BUBBLE STUDY;  Surgeon: Freada Bergeron, MD;  Location: [place];  Service: Cardiovascular;;   TEE WITHOUT CARDIOVERSION N/A 01/02/2021   Procedure: TRANSESOPHAGEAL ECHOCARDIOGRAM (TEE);  Surgeon: Freada Bergeron, MD;  Location: [facility] ENDOSCOPY;  Service: Cardiovascular;  Laterality: N/A;   UMBILICAL HERNIA REPAIR N/A 03/15/2015   Procedure: UMBILICAL HERNIA REPAIR WITH MESH;  Surgeon: Coralie Keens, MD;  Location: [place];  Service: General;  Laterality: N/A;    There were no vitals filed for this visit.   Subjective Assessment - 01/11/22 1107     Subjective L shoulder feels better, more loose. No problems after treatment on Tues.    Pertinent History HTN, obesity, chronic back pain.    Limitations Lifting    How long can you sit comfortably? Feels better with the therapy. Very minimal pain at rest, still has some pain with elevation, but his ROM has increased and pain has decreased.    Patient Stated Goals Improve use of LUE, return to work    Currently in Pain? No/denies    Pain Onset More than a month ago                [facility] PT  Assessment - 01/11/22 0001       AROM   Left Shoulder Flexion 160 Degrees    Left Shoulder ABduction 160 Degrees      PROM   Left Shoulder Flexion 168 Degrees    Left Shoulder ABduction 168 Degrees                           [facility] Adult PT Treatment/Exercise - 01/11/22 0001       Neck Exercises: Machines for Strengthening   UBE (Upper Arm Bike) L 2, 2 min each way.      Shoulder Exercises: Standing   Flexion Strengthening;Left;10 reps;Weights    Shoulder Flexion Weight (lbs) 2#    ABduction Strengthening;Left;10 reps;Weights    Shoulder ABduction Weight (lbs) 2#    Diagonals Strengthening;Both;20 reps;Weights    Diagonals Weight (lbs) 10# Power Tower    Diagonals Limitations 1 x 10 in both directions, both sides.    Other Standing Exercises Shoulder abd between 90-150 with 1# weight 10 reps.      Shoulder Exercises: Body Blade   Flexion 15 seconds;2 reps;Other (comment)   L   ABduction 15 seconds;2 reps;Other (comment)   L     Manual Therapy   Manual Therapy Soft  tissue mobilization;Passive ROM    Soft tissue mobilization Deep tissue mobilizatoin to L pectoralis F/B stretch.    Passive ROM L shoulder flexion, abd                     PT Education - 01/11/22 1147     Education Details Patietn educated to decrease weight to 1# during abd to be able to go through full ROM.    Person(s) Educated Patient    Methods Explanation    Comprehension Verbalized understanding              PT Short Term Goals - 01/11/22 1129       PT SHORT TERM GOAL #1   Title I with basic HEP.    Baseline initiated    Time 3    Period Weeks    Status Achieved    Target Date 01/11/22               PT Long Term Goals - 01/11/22 1130       PT LONG TERM GOAL #1   Title I with final HEP.    Time 10    Period Weeks    Status New    Target Date 02/22/22      PT LONG TERM GOAL #2   Title Patient will increase FOTO score to 69%    Baseline 55%     Time 10    Period Weeks    Status On-going    Target Date 02/22/22      PT LONG TERM GOAL #3   Title Patient will improve active L shoulder flexion and abduction to at least 150 degrees with pain < 4/10.    Baseline 160 degrees, no pain    Time 10    Period Weeks    Status Achieved    Target Date 02/22/22      PT LONG TERM GOAL #4   Title Patient will increase L shoulder strength to at least 4/5 in all planes with pain < 4/10.    Baseline Strength 3+/5    Time 6    Period Weeks    Status On-going    Target Date 02/22/22                   Plan - 01/11/22 1136     Clinical Impression Statement patient continuing to progress. He has met LTG for ROM in L shoulder, strength has also improved. HEP remains pertinent, emphasizing the strengthening.    Personal Factors and Comorbidities Comorbidity 3+;Profession;Past/Current Experience    Comorbidities HTN, obesity, chronic back pain.    Examination-Activity Limitations Carry;Dressing;Hygiene/Grooming;Lift;Bathing;Reach Overhead    Stability/Clinical Decision Making Stable/Uncomplicated    Clinical Decision Making Moderate    Rehab Potential Good    PT Frequency 2x / week    PT Duration 6 weeks    PT Treatment/Interventions ADLs/Self Care Home Management;Iontophoresis 57m/ml Dexamethasone;Taping;Moist Heat;Ultrasound;Passive range of motion;Dry needling;Manual techniques;Therapeutic exercise;Therapeutic activities    PT Next Visit Plan Assess need for additional DN, update strengthening.    Consulted and Agree with Plan of Care Patient             Patient will benefit from skilled therapeutic intervention in order to improve the following deficits and impairments:  Decreased range of motion, Increased fascial restricitons, Pain, Improper body mechanics, Postural dysfunction, Decreased mobility, Decreased strength  Visit Diagnosis: Muscle weakness (generalized)  Localized edema  Other lack of coordination  Chronic  left  shoulder pain  Pain in left arm     Problem List Patient Active Problem List   Diagnosis Date Noted   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   Cerebrovascular accident (CVA) of right basal ganglia ([place]) 01/02/2021   CVA (cerebral vascular accident) ([place]) 12/29/2020   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 [place], [place], [address] Phone: [phone]   Fax:  [phone]  Name: Jaedyn Lard MRN: [id number] Date of Birth: 1966-01-11

## 2022-01-13 ENCOUNTER — Other Ambulatory Visit: Payer: Self-pay | Admitting: Medical

## 2022-01-16 ENCOUNTER — Ambulatory Visit: Payer: Medicaid Other | Admitting: Physical Therapy

## 2022-01-18 ENCOUNTER — Other Ambulatory Visit: Payer: Self-pay

## 2022-01-18 ENCOUNTER — Ambulatory Visit: Payer: Medicaid Other | Admitting: Physical Therapy

## 2022-01-18 DIAGNOSIS — M6281 Muscle weakness (generalized): Secondary | ICD-10-CM

## 2022-01-18 DIAGNOSIS — R600 Localized edema: Secondary | ICD-10-CM | POA: Diagnosis not present

## 2022-01-18 DIAGNOSIS — G8929 Other chronic pain: Secondary | ICD-10-CM | POA: Diagnosis not present

## 2022-01-18 DIAGNOSIS — M79602 Pain in left arm: Secondary | ICD-10-CM | POA: Diagnosis not present

## 2022-01-18 DIAGNOSIS — R278 Other lack of coordination: Secondary | ICD-10-CM | POA: Diagnosis not present

## 2022-01-18 DIAGNOSIS — I6381 Other cerebral infarction due to occlusion or stenosis of small artery: Secondary | ICD-10-CM | POA: Diagnosis not present

## 2022-01-18 DIAGNOSIS — M25512 Pain in left shoulder: Secondary | ICD-10-CM | POA: Diagnosis not present

## 2022-01-18 NOTE — Therapy (Signed)
[place]. [place], [place], [address] Phone: [phone]   Fax:  [phone]  Physical Therapy Treatment PHYSICAL THERAPY DISCHARGE SUMMARY  Visits from Start of Care: 7  Current functional level related to goals / functional outcomes: See objective assessments.   Remaining deficits: Mild pain and shoulder weakness. Pt scheduled to transition to gym workouts to continue rehab. Verbalized understanding of precautions.   Education / Equipment: Educated to safe return to gym, using a Physiological scientist for several visits to facilitate the transition.   Patient agrees to discharge. Patient goals were partially met. Patient is being discharged due to being pleased with the current functional level. Aaron Gibson DPT 01/18/22 3:28 PM    Patient Details  Name: Aaron Gibson MRN: [id number] Date of Birth: [birth year] Referring Provider (PT): Leandrew Koyanagi   Encounter Date: 01/18/2022   PT End of Session - 01/18/22 1112     Visit Number 7    Number of Visits 20    Date for PT Re-Evaluation 02/22/22    PT Start Time 3202    PT Stop Time 1125    PT Time Calculation (min) 40 min             Past Medical History:  Diagnosis Date   Hypertension    untreated   Morbid obesity ([place])     Past Surgical History:  Procedure Laterality Date   BUBBLE STUDY  01/02/2021   Procedure: BUBBLE STUDY;  Surgeon: Freada Bergeron, MD;  Location: [facility];  Service: Cardiovascular;;   TEE WITHOUT CARDIOVERSION N/A 01/02/2021   Procedure: TRANSESOPHAGEAL ECHOCARDIOGRAM (TEE);  Surgeon: Freada Bergeron, MD;  Location: [facility] ENDOSCOPY;  Service: Cardiovascular;  Laterality: N/A;   UMBILICAL HERNIA REPAIR N/A 03/15/2015   Procedure: UMBILICAL HERNIA REPAIR WITH MESH;  Surgeon: Coralie Keens, MD;  Location: [place];  Service: General;  Laterality: N/A;    There were no vitals filed for this visit.   Subjective Assessment  - 01/18/22 1052     Subjective overall pain and fucn 75% better. I need to build strength and start using using it more. I am ready to D/C and transition to [facility] for [facility]    Currently in Pain? No/denies                               [facility] Adult PT Treatment/Exercise - 01/18/22 0001       Neck Exercises: Machines for Strengthening   UBE (Upper Arm Bike) L3 3 min fwd/ 3 min back    Cybex Row 20# 2 sets 10    Cybex Chest Press 15# 2 sets 10    Lat Pull 20# 2 sets 10      Shoulder Exercises: Standing   External Rotation Strengthening;Left;20 reps   5# cable pulley   Internal Rotation Strengthening;Left;20 reps   5# cable pulleys   Flexion Strengthening;Left;Weights;20 reps    Shoulder Flexion Weight (lbs) 2#    ABduction Strengthening;Left;Weights;20 reps    Shoulder ABduction Weight (lbs) 2#    Extension Strengthening;Both;20 reps   cable pulleys 5#                      PT Short Term Goals - 01/11/22 1129       PT SHORT TERM GOAL #1   Title I with basic HEP.    Baseline initiated    Time  3    Period Weeks    Status Achieved    Target Date 01/11/22               PT Long Term Goals - 01/18/22 1054       PT LONG TERM GOAL #1   Title I with final HEP.    Baseline pt to start at [facility] and work with trainier    Status Achieved      PT LONG TERM GOAL #2   Title Patient will increase FOTO score to 69%    Baseline no foto ever captured    Status Not Met      PT LONG TERM GOAL #3   Title Patient will improve active L shoulder flexion and abduction to at least 150 degrees with pain < 4/10.    Status Achieved      PT LONG TERM GOAL #4   Title Patient will increase L shoulder strength to at least 4/5 in all planes with pain < 4/10.    Baseline still pain with resistance    Status Partially Met                   Plan - 01/18/22 1100     Clinical Impression Statement most goals met. FOTO never captured and still some pain with  resistance. pt 75% better and feels good for D/C and transition to [facility] with trainer    PT Treatment/Interventions ADLs/Self Care Home Management;Iontophoresis 100m/ml Dexamethasone;Taping;Moist Heat;Ultrasound;Passive range of motion;Dry needling;Manual techniques;Therapeutic exercise;Therapeutic activities    PT Next Visit Plan D/C             Patient will benefit from skilled therapeutic intervention in order to improve the following deficits and impairments:  Decreased range of motion, Increased fascial restricitons, Pain, Improper body mechanics, Postural dysfunction, Decreased mobility, Decreased strength  Visit Diagnosis: Muscle weakness (generalized)     Problem List Patient Active Problem List   Diagnosis Date Noted   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   Cerebrovascular accident (CVA) of right basal ganglia ([place] 01/02/2021   CVA (cerebral vascular accident) ([place] 12/29/2020   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 Patient goals were partially met. Patient is being discharged due to being pleased with the current functional level.  Tyrone Balash,ANGIE, PTA 01/18/2022, 11:12 AM  [place] [address]Phone: [phone]  Fax:  [phone] Name: Aaron OhmsMRN: [id number] of Birth: [birth year]

## 2022-01-23 ENCOUNTER — Ambulatory Visit: Payer: Medicaid Other | Admitting: Physical Therapy

## 2022-01-30 ENCOUNTER — Ambulatory Visit (INDEPENDENT_AMBULATORY_CARE_PROVIDER_SITE_OTHER): Payer: Medicaid Other | Admitting: *Deleted

## 2022-01-30 DIAGNOSIS — E538 Deficiency of other specified B group vitamins: Secondary | ICD-10-CM

## 2022-01-30 MED ORDER — CYANOCOBALAMIN 1000 MCG/ML IJ SOLN
1000.0000 ug | Freq: Once | INTRAMUSCULAR | Status: AC
  Administered 2022-01-30: 1000 ug via INTRAMUSCULAR

## 2022-01-30 NOTE — Progress Notes (Addendum)
Pt here for monthly B12 injection 3 of 5 per [facility]   B12 given IM, left deltoid and pt tolerated injection well.   Next B12 injection scheduled for next month.   Esperanza Richters, PA-C

## 2022-02-27 ENCOUNTER — Ambulatory Visit (INDEPENDENT_AMBULATORY_CARE_PROVIDER_SITE_OTHER): Payer: Medicaid Other

## 2022-02-27 DIAGNOSIS — E538 Deficiency of other specified B group vitamins: Secondary | ICD-10-CM | POA: Diagnosis not present

## 2022-02-27 MED ORDER — CYANOCOBALAMIN 1000 MCG/ML IJ SOLN
1000.0000 ug | Freq: Once | INTRAMUSCULAR | Status: AC
  Administered 2022-02-27: 1000 ug via INTRAMUSCULAR

## 2022-02-27 NOTE — Progress Notes (Addendum)
Aaron Gibson is a 56 y.o. male  presents to the office today for a B12 injection, per physician's orders. ?Original order: per Esperanza Richters, PA-C ? ?Monthly injection 4/5. ? ?cyanocobalamin, 1000 mg/ml IM was administered in right deltoid today.  ?Patient tolerated injection well.  ? ?Next appointment:  04/03/22 ? ?Esperanza Richters, PA-C  ?

## 2022-03-14 ENCOUNTER — Telehealth: Payer: Self-pay | Admitting: Physical Medicine and Rehabilitation

## 2022-03-14 ENCOUNTER — Other Ambulatory Visit: Payer: Self-pay | Admitting: Medical

## 2022-03-14 NOTE — Telephone Encounter (Signed)
Patient called. He would like an injection in his L shoulder.  His call back number is [phone] ?

## 2022-03-15 ENCOUNTER — Encounter: Payer: Self-pay | Admitting: Adult Health

## 2022-03-15 ENCOUNTER — Ambulatory Visit: Payer: Medicaid Other | Admitting: Adult Health

## 2022-03-15 VITALS — BP 137/84 | HR 86 | Ht 67.0 in | Wt 220.0 lb

## 2022-03-15 DIAGNOSIS — I639 Cerebral infarction, unspecified: Secondary | ICD-10-CM | POA: Diagnosis not present

## 2022-03-15 DIAGNOSIS — I69354 Hemiplegia and hemiparesis following cerebral infarction affecting left non-dominant side: Secondary | ICD-10-CM | POA: Diagnosis not present

## 2022-03-15 DIAGNOSIS — R480 Dyslexia and alexia: Secondary | ICD-10-CM | POA: Diagnosis not present

## 2022-03-15 NOTE — Patient Instructions (Signed)
You will be called by speech therapy at [facility] to further help with reading and comprehension  ? ?Continue aspirin 81 mg daily  and atorvastatin  for secondary stroke prevention ? ?Continue to follow up with PCP regarding cholesterol, blood pressure and diabetes management  ?Maintain strict control of hypertension with blood pressure goal below 130/90, diabetes with hemoglobin A1c goal below 7.0 % and cholesterol with LDL cholesterol (bad cholesterol) goal below 70 mg/dL.  ? ?Signs of a Stroke? Follow the BEFAST method:  ?Balance Watch for a sudden loss of balance, trouble with coordination or vertigo ?Eyes Is there a sudden loss of vision in one or both eyes? Or double vision?  ?Face: Ask the person to smile. Does one side of the face droop or is it numb?  ?Arms: Ask the person to raise both arms. Does one arm drift downward? Is there weakness or numbness of a leg? ?Speech: Ask the person to repeat a simple phrase. Does the speech sound slurred/strange? Is the person confused ? ?Time: If you observe any of these signs, call 911. ? ? ? ? ? ? ? ?Thank you for coming to see [place] at [facility]. I hope we have been able to provide you high quality care today. ? ?You may receive a patient satisfaction survey over the next few weeks. We would appreciate your feedback and comments so that we may continue to improve ourselves and the health of our patients. ? ?

## 2022-03-15 NOTE — Progress Notes (Signed)
?[facility] ?[address] ?[place]. [place] [address] ?[phone] ? ?     OFFICE FOLLOW-UP NOTE ? ?[name] ?Date of Birth:  Oct 07, 1966 ?Medical Record Number:  [id number]  ? ? ?Primary neurologist: Dr. Leonie Gibson ?Reason for visit: Stroke follow-up ? ? ?Chief Complaint  ?Patient presents with  ? Follow-up  ?  RM 3 alone ?Pt is well, states his retention and concentration has decreased.   ?  ? ? ? ?HPI:  ? ?Update 03/15/2022 JM: Patient returns for 91-monthstroke follow-up.  Overall stable without new stroke/TIA symptoms.  Reports residual left shoulder pain and left sided weakness. Restarted PT which was completed on 1/26 as he was pleased with current level of functioning.  Shoulder pain improved with PT and injection by Dr. XErlinda Gibson- he plans on f/u with Dr. NErnestina Patcheswith hopes of repeat injection as it has been gradually worsening.  ? ?Reports trying to get back into reading as he was an avid reader before his stroke. He has noticed difficulty with concentrating on what he is reading (his mind will wander while reading), difficulty remembering what he read (he reads prior to going to bed) and difficulty reading aloud. He is able to comprehend what he is reading. No other memory difficulties. No speech or language difficulties. Does endorse typical everyday stressors but denies any specific anxiety or depression. ? ?Compliant on aspirin and atorvastatin, denies side effects.  Blood pressure today 137/84.  No further concerns at this time. ? ? ? ? ?History provided for reference purposes only ?Update 09/11/2021 JM: Mr. JFriedelreturns for 639-monthtroke follow-up unaccompanied.  Overall stable with residual left sided weakness although notes improvement since prior visit.  Working with PT/OT placed on hold during the month of September and plans to restart next month. C/o left shoulder pain since stroke - recent eval by ortho with plans on obtaining MRI scheduled on 9/28.  Does not use assistive  device - no recent falls.  Compliant on aspirin and atorvastatin.  Blood pressure today 136/84.  30-day cardiac event monitor negative for atrial fibrillation.  Sleep smart study completed - did not receive CPAP machine during study. No new concerns at this time.  ? ?Hospital follow-up 03/02/2021 Dr. SeLeonie ManMr. Aaron Gibson a [age]ear old African-American male seen today for initial office follow-up visit following hospital admission for stroke in January 2022.Aaron Gibson a [age].o. male who was evaluated as a Code Stroke in the ED. Hypertensive crisis noted with BP  220/110. TPA was administered. Head CT showed no acute intracranial abnormality. MR Brain revealed acute infarction within the posterior putamen, posterior body of the caudate and radiating white matter tracts on the right. CTA head/ neck showed no large vessel occlusion. EEG was normal without seizure activity. TEE showed a small patent foramen ovale and EF 60-75%.   Patient had a low rope score of 3-4 hence was not considered for PFO closure.  LDL cholesterol was elevated at 138 mg percent and he was started on Lipitor 80 mg daily.  Hemoglobin A1c was 6.5. ?Patient was not eligible for loop recorder due to lack of health insurance coverage. DAPT was provided throughout his stay. Covid vaccine was administered.  ?He remained hemodynamically and neurologically stable throughout his stay. Some improvement in left sided weakness was appreciated. Physical, speech, and occupational therapists were consulted and provided evaluation, treatment and discharge planning recommendations. He was recommended for inpatient rehabilitation which was arranged by the care management team. . Patient  states is done well since discharge.  He still has mild left grip weakness and diminished fine motor skills but otherwise is getting better.  He did not do any outpatient therapies.  He has discontinued Plavix and is on aspirin alone which is tolerating well without bruising  or bleeding.  His blood pressure is well controlled today it is 137/88.  His fasting sugars are also okay and he is plans to see his primary care physician this week to discuss further management.  He remains on Lipitor 80 mg which is tolerating well without muscle aches and pains.  Patient and could not afford a loop recorder monitoring for A. fib.  He was enrolled in the sleep smart study but randomized to the medical treatment. ? ? ?ROS:   ?14 system review of systems is positive for those listed in HPI and all other systems negative ? ?PMH:  ?Past Medical History:  ?Diagnosis Date  ? Hypertension   ? untreated  ? Morbid obesity ([place])   ? ? ?Social History:  ?Social History  ? ?Socioeconomic History  ? Marital status: Single  ?  Spouse name: Not on file  ? Number of children: Not on file  ? Years of education: Not on file  ? Highest education level: Not on file  ?Occupational History  ? Occupation: Unemployed   ?Tobacco Use  ? Smoking status: Former  ?  Packs/day: 1.00  ?  Years: 29.00  ?  Pack years: 29.00  ?  Types: Cigarettes  ? Smokeless tobacco: Never  ? Tobacco comments:  ?  quit 12/28/2020  ?Vaping Use  ? Vaping Use: Never used  ?Substance and Sexual Activity  ? Alcohol use: Yes  ?  Comment: occasionally holidays  ? Drug use: No  ? Sexual activity: Yes  ?Other Topics Concern  ? Not on file  ?Social History Narrative  ? Lives with daughter  ? Right Handed  ? Caffeine 2x week  ? ?Social Determinants of Health  ? ?Financial Resource Strain: Not on file  ?Food Insecurity: Not on file  ?Transportation Needs: Not on file  ?Physical Activity: Not on file  ?Stress: Not on file  ?Social Connections: Not on file  ?Intimate Partner Violence: Not on file  ? ? ?Medications:   ?Current Outpatient Medications on File Prior to Visit  ?Medication Sig Dispense Refill  ? Accu-Chek Softclix Lancets lancets Use to check Blood sugars once a day. Dx E11.8 100 each 1  ? acetaminophen (TYLENOL) 325 MG tablet Take 2 tablets (650 mg  total) by mouth every 4 (four) hours as needed for mild pain (or temp > 37.5 C (99.5 F)).    ? albuterol (VENTOLIN HFA) 108 (90 Base) MCG/ACT inhaler Inhale 2 puffs into the lungs every 6 (six) hours as needed. 18 g 0  ? amLODipine (NORVASC) 10 MG tablet TAKE 1 TABLET(10 MG) BY MOUTH DAILY 90 tablet 0  ? aspirin 81 MG chewable tablet Chew 1 tablet (81 mg total) by mouth daily.    ? atorvastatin (LIPITOR) 80 MG tablet TAKE 1 TABLET(80 MG) BY MOUTH DAILY 30 tablet 5  ? Blood Glucose Monitoring Suppl (ACCU-CHEK GUIDE ME) w/Device KIT Use to check Blood sugars once a day. Dx E11.8 1 kit 0  ? buPROPion (WELLBUTRIN XL) 150 MG 24 hr tablet Take 1 tablet (150 mg total) by mouth daily. 30 tablet 3  ? cyclobenzaprine (FLEXERIL) 5 MG tablet Take 1 tablet (5 mg total) by mouth at bedtime. 5 tablet 0  ?  glucose blood (ACCU-CHEK GUIDE) test strip Use to check Blood sugars once a day. Dx E11.8 100 each 1  ? losartan (COZAAR) 50 MG tablet Take 1 tablet (50 mg total) by mouth daily. 90 tablet 1  ? metFORMIN (GLUCOPHAGE) 500 MG tablet Take 1 tablet (500 mg total) by mouth 2 (two) times daily with a meal. 60 tablet 3  ? methylPREDNISolone (MEDROL) 4 MG tablet Standard 6 day taper dose 21 tablet 0  ? traMADol (ULTRAM) 50 MG tablet Take 1 tablet (50 mg total) by mouth 3 (three) times daily as needed. 30 tablet 0  ? pantoprazole (PROTONIX) 40 MG tablet TAKE 1 TABLET (40 MG TOTAL) BY MOUTH DAILY. 30 tablet 0  ? traZODone (DESYREL) 100 MG tablet TAKE 1 TABLET (100 MG TOTAL) BY MOUTH AT BEDTIME AS NEEDED FOR SLEEP. 30 tablet 0  ? ?No current facility-administered medications on file prior to visit.  ? ? ?Allergies:  Not on File ? ?Physical Exam ?Today's Vitals  ? 03/15/22 0941  ?BP: 137/84  ?Pulse: 86  ?Weight: 220 lb (99.8 kg)  ?Height: '5\' 7"'  (1.702 m)  ? ?Body mass index is 34.46 kg/m?.  ? ?General: Obese pleasant middle-aged African-American male seated, in no evident distress ?Head: head normocephalic and atraumatic.  ?Neck: supple with  no carotid or supraclavicular bruits ?Cardiovascular: regular rate and rhythm, no murmurs ? ?Neurologic Exam ?Mental Status: Awake and fully alert.  Fluent speech and language. No difficulty reading shor

## 2022-03-22 ENCOUNTER — Ambulatory Visit: Payer: Medicaid Other | Admitting: Orthopaedic Surgery

## 2022-03-27 ENCOUNTER — Ambulatory Visit: Payer: Medicaid Other | Attending: Adult Health | Admitting: Speech Pathology

## 2022-03-27 ENCOUNTER — Encounter: Payer: Self-pay | Admitting: Speech Pathology

## 2022-03-27 DIAGNOSIS — R41841 Cognitive communication deficit: Secondary | ICD-10-CM | POA: Insufficient documentation

## 2022-03-27 DIAGNOSIS — R480 Dyslexia and alexia: Secondary | ICD-10-CM | POA: Diagnosis not present

## 2022-03-27 NOTE — Therapy (Signed)
?OUTPATIENT SPEECH LANGUAGE PATHOLOGY APHASIA EVALUATION ? ? ?Patient Name: Aaron Gibson ?MRN: [id number] ?DOB:30-Jan-1966, 56 y.o., male ?Today's Date: 03/27/2022 ? ?PCP: Mackie Pai, PA-C ?REFERRING PROVIDER: Frann Rider, NP ? ? End of Session - 03/27/22 1602   ? ? Visit Number 1   ? Number of Visits 11   ? Date for SLP Re-Evaluation 06/05/22   ? SLP Start Time 1400   ? SLP Stop Time  1450   ? SLP Time Calculation (min) 50 min   ? Activity Tolerance Patient tolerated treatment well   ? ?  ?  ? ?  ? ? ?Past Medical History:  ?Diagnosis Date  ? Hypertension   ? untreated  ? Morbid obesity ([place])   ? ?Past Surgical History:  ?Procedure Laterality Date  ? BUBBLE STUDY  01/02/2021  ? Procedure: BUBBLE STUDY;  Surgeon: Freada Bergeron, MD;  Location: [facility];  Service: Cardiovascular;;  ? TEE WITHOUT CARDIOVERSION N/A 01/02/2021  ? Procedure: TRANSESOPHAGEAL ECHOCARDIOGRAM (TEE);  Surgeon: Freada Bergeron, MD;  Location: [facility];  Service: Cardiovascular;  Laterality: N/A;  ? UMBILICAL HERNIA REPAIR N/A 03/15/2015  ? Procedure: UMBILICAL HERNIA REPAIR WITH MESH;  Surgeon: Coralie Keens, MD;  Location: [facility];  Service: General;  Laterality: N/A;  ? ?Patient Active Problem List  ? Diagnosis Date Noted  ? 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  ? Cerebrovascular accident (CVA) of right basal ganglia ([place]) 01/02/2021  ? CVA (cerebral vascular accident) ([place]) 12/29/2020  ? 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 Chest pain, musculoskeletal 12/28/2020  ? Morbid obesity ([place])   ? Hypertension   ? ? ?ONSET DATE: 01/02/2021 (CVA) ? ?REFERRING DIAG: R48.0 (ICD-10-CM) - [facility]  ? ?THERAPY DIAG:  ?Dyslexia and alexia ? ?Cognitive communication deficit ? ?SUBJECTIVE:  ? ?SUBJECTIVE STATEMENT: ?Pt was pleasant and cooperative throughout assessment.  ? ?Pt accompanied by: self ? ?PERTINENT HISTORY: CVA, hypertension  ? ?PAIN:  ?Are you having pain?  No ? ?FALLS: Has patient fallen in last 6 months?  No ? ?LIVING ENVIRONMENT: ?Lives with: lives with their family and lives with their daughter ?Lives in: House/apartment ? ?PLOF:  ?Level of assistance: Independent with ADLs, Independent with IADLs ?Employment: On disability ? ? ?PATIENT GOALS Get back to reading ? ?OBJECTIVE:  ? ?DIAGNOSTIC FINDINGS:  ? ?CT HEAD 12/29/20: Progressive hypoattenuation in the non-hemorrhagic right basal ?ganglia infarct. ? ?CT HEAD 12/28/20:  ?1. No large vessel occlusion or significant stenosis in the neck. ?2. Subtle luminal irregularity throughout the bilateral MCA, ACA and ?PCA vascular tree may represent mild intracranial atherosclerotic ?disease without hemodynamically significant stenosis. ? ?COGNITION: ?Overall cognitive status: Within functional limits for tasks assessed ? ?Comments: SLUMS 30/30 ? ?AUDITORY COMPREHENSION: ?Overall auditory comprehension: Appears intact for tasks assessed ?YES/NO questions: Appears intact ?Following directions: Appears intact ?Conversation: Moderately Complex ?Interfering components: attention ?Effective technique: repetition/stressing words ? ? ?READING COMPREHENSION: Impaired: paragraph ? ?EXPRESSION: verbal ? ?VERBAL EXPRESSION: ?Level of generative/spontaneous verbalization: conversation ? ? ?WRITTEN EXPRESSION: ?Dominant hand: right ? Written expression: Impaired: word ? ?MOTOR SPEECH: ?Overall motor speech: Appears intact ? ? ?STANDARDIZED ASSESSMENTS: ?SLUMS: 30/30 ?[place] for Reading and Spelling:  ?- (Single word) Reading: 39/40 "doubt" for "debt"  ?-  (Single word) Spelling: 17/40 "situble" for "subtle"; "theaf" for "thief"; "brod" for "broad" ?- (Single word) Nonword Reading: 18/20 "sheed" for "cheed"; "niece" for "nace"  ?- (Single word) Nonword Spelling: 13/20 "fleg" for "flig"; "smoud" for "smode", "greast"  for "grest"  ?- Pt reported it was difficulty to ID the letter that corresponded with the sound.  ? ?Reading aloud (7th  grade RL): 90 WPM ? ? PATIENT REPORTED OUTCOME MEASURES (PROM): ?NA ? ? ? ? ?PATIENT EDUCATION: ?Education details: Alexia ?Person educated: Patient ?Education method: Explanation and Demonstration ?Education comprehension: verbalized understanding and needs further education ? ? ?GOALS: ?Goals reviewed with patient? Yes ? ?SHORT TERM GOALS: Target date: 05/01/2022 ? ?Given personally relevant reading material, patient will independently read single words aloud from material with at least 90% accuracy across a 5 minute time span.  ?Baseline: (non personally-relevant >90% in a 1 minute time span) ?Goal status: INITIAL ? ?2.  Patient will recall 3 strategies to assist with maintaining attention while reading with minA verbal cues. ?Baseline: 0 ?Goal status: INITIAL ? ? ?LONG TERM GOALS: Target date: 06/05/2022 ? ?Given personally relevant reading material, patient will independently read single words aloud from material with at least 90% accuracy across a 10 minute time span.  ?Baseline:  ?Goal status: INITIAL ? ?2.  Patient will answer reading comprehension questions with 80% accuracy post 5 pages of reading silently and/or aloud. ?Baseline: Unable to recall details  ?Goal status: INITIAL ? ?3.  Patient will recall and demonstrate 2-3 strategies to assist with maintaining attention while reading with independently. ?Baseline: 0  ?Goal status: INITIAL ? ? ?ASSESSMENT: ? ?CLINICAL IMPRESSION: ?Pt is a 56 yo male who presents to [facility] for evaluation post CVA in 2022. Pt endorses difficulty with re: trouble reading aloud, mixing letters up, not able to retain what has been read and difficulty paying attention while reading. He reports he has just begun reading again and has not noticed this difficulty before. He reported he was unaware of deficits until daughter pointed them out when he was reading a passage aloud. Pt was assessed using SLUMS and [employer] for Reading and Writing. Pt demonstrated relative strengths in  regular high and low frequency words and relative weaknesses in irregular, low (and high) frequency words. 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 Pt was able to tell therapist when his mind was starting to drift SLP rec skilled ST services to address reading treatment and strategies.  ? ? ?OBJECTIVE IMPAIRMENTS include attention and alexia /agraphia. These impairments are limiting patient from  maximizing independence with written information that can impact safety . ?Factors affecting potential to achieve goals and functional outcome are  NA . Patient will benefit from skilled SLP services to address above impairments and improve overall function. ? ?REHAB POTENTIAL: Good ? ?PLAN: ?SLP FREQUENCY: 1x/week ? ?SLP DURATION: 10 weeks ? ?PLANNED INTERVENTIONS: Language facilitation, Environmental controls, Cueing hierachy, Internal/external aids, Functional tasks, SLP instruction and feedback, Compensatory strategies, and Patient/family education ? ? ? ?Verdene Lennert, [place] ?03/27/2022, 4:02 PM ? ? ? ?  ?

## 2022-04-03 ENCOUNTER — Ambulatory Visit (INDEPENDENT_AMBULATORY_CARE_PROVIDER_SITE_OTHER): Payer: Medicaid Other

## 2022-04-03 DIAGNOSIS — E538 Deficiency of other specified B group vitamins: Secondary | ICD-10-CM | POA: Diagnosis not present

## 2022-04-03 MED ORDER — CYANOCOBALAMIN 1000 MCG/ML IJ SOLN
1000.0000 ug | Freq: Once | INTRAMUSCULAR | Status: AC
  Administered 2022-04-03: 1000 ug via INTRAMUSCULAR

## 2022-04-03 NOTE — Progress Notes (Addendum)
Aaron Gibson is a 56 y.o. male presents to the office today for a B12 injection, per physician's orders. ?Original order: per Esperanza Richters, PA-C ? ?Monthly injection 5/5. ? ?cyanocobalamin, 1000 mg/ml IM was administered in Left deltoid today.  ?Patient tolerated injection well.  ? ? ?Esperanza Richters, PA-C  ? ?

## 2022-04-04 ENCOUNTER — Ambulatory Visit: Payer: Medicaid Other | Admitting: Speech Pathology

## 2022-04-09 ENCOUNTER — Encounter: Payer: Self-pay | Admitting: Physical Medicine and Rehabilitation

## 2022-04-09 ENCOUNTER — Telehealth: Payer: Self-pay | Admitting: Physical Medicine and Rehabilitation

## 2022-04-09 NOTE — Telephone Encounter (Signed)
Pt called to cancel appt. Please confirm and call pt at [phone]. ?

## 2022-04-10 ENCOUNTER — Encounter: Payer: Medicaid Other | Admitting: Physical Medicine and Rehabilitation

## 2022-06-19 ENCOUNTER — Encounter: Payer: Self-pay | Admitting: Medical

## 2022-06-19 ENCOUNTER — Ambulatory Visit: Payer: Medicaid Other | Admitting: Medical

## 2022-06-19 VITALS — BP 140/80 | HR 65 | Temp 98.5°F | Resp 18 | Ht 67.0 in | Wt 219.0 lb

## 2022-06-19 DIAGNOSIS — I10 Essential (primary) hypertension: Secondary | ICD-10-CM

## 2022-06-19 DIAGNOSIS — M7502 Adhesive capsulitis of left shoulder: Secondary | ICD-10-CM

## 2022-06-19 MED ORDER — CYCLOBENZAPRINE HCL 5 MG PO TABS: ORAL_TABLET | ORAL | 0 refills | Status: DC

## 2022-06-19 NOTE — Progress Notes (Signed)
Subjective:    Patient ID: Aaron Gibson, male    DOB: [birth year], 56 y.o.   MRN: [id number]  HPI  Pt in with left shoulder pain. Pt had left shoulder pain for years since having stroke jan 2021.  Pt states pain is present in past on lifting arm. 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 In past he got injection in shoulder that helped a lot but just recently pain is back similar to before.  Pt was told could get repeat injection if needed. He was told in past that might need surgery.   Past mri of shoulder showed. 1. Mild supraspinatus tendinosis with low-grade fraying along the anterior to mid tendon insertion. No full-thickness or retracted tear. 2. Mild distal subscapularis tendinosis. 3. Moderate intra-articular biceps tendinosis and trace tenosynovitis. 4. Focal intramuscular edema involving the superficial aspect of the lateral deltoid muscle. Findings may reflect a muscle strain. Injection-related changes could also have this appearance. Correlate for history of recent vaccination. 5. Mild acromioclavicular and glenohumeral osteoarthritis.   Htn- pt took amlodipine today. Last time he took losartan was past Friday or Saturday. Recent using alleve intermittenlty past 2-3 weeks. At home 145-152/75 approx  Pt financial strain since not going back to work yet. Going thru disability paperwork.      Review of Systems  Constitutional:  Negative for chills, fatigue and fever.  HENT:  Negative for congestion and ear pain.   Respiratory:  Negative for cough, chest tightness, shortness of breath and wheezing.   Cardiovascular:  Negative for chest pain and palpitations.  Gastrointestinal:  Negative for abdominal pain.  Musculoskeletal:  Negative for back pain, myalgias and neck pain.       Left shoulder pain.  Skin:  Negative for rash.  Neurological:  Negative for dizziness and headaches.  Hematological:  Negative for adenopathy. Does not bruise/bleed easily.   Psychiatric/Behavioral:  Negative for behavioral problems and confusion.     Past Medical History:  Diagnosis Date   Hypertension    untreated   Morbid obesity ([facility])      Social History   Socioeconomic History   Marital status: Single    Spouse name: Not on file   Number of children: Not on file   Years of education: Not on file   Highest education level: Not on file  Occupational History   Occupation: Unemployed   Tobacco Use   Smoking status: Former    Packs/day: 1.00    Years: 29.00    Total pack years: 29.00    Types: Cigarettes   Smokeless tobacco: Never   Tobacco comments:    quit 12/28/2020  Vaping Use   Vaping Use: Never used  Substance and Sexual Activity   Alcohol use: Yes    Comment: occasionally holidays   Drug use: No   Sexual activity: Yes  Other Topics Concern   Not on file  Social History Narrative   Lives with daughter   Right Handed   Caffeine 2x week   Social Determinants of Health   Financial Resource Strain: Not on file  Food Insecurity: Not on file  Transportation Needs: Not on file  Physical Activity: Not on file  Stress: Not on file  Social Connections: Not on file  Intimate Partner Violence: Not on file    Past Surgical History:  Procedure Laterality Date   BUBBLE STUDY  01/02/2021   Procedure: BUBBLE STUDY;  Surgeon: Meriam Sprague, MD;  Location: [facility] ENDOSCOPY;  Service:  Cardiovascular;;   TEE WITHOUT CARDIOVERSION N/A 01/02/2021   Procedure: TRANSESOPHAGEAL ECHOCARDIOGRAM (TEE);  Surgeon: Meriam Sprague, MD;  Location: [facility] ENDOSCOPY;  Service: Cardiovascular;  Laterality: N/A;   UMBILICAL HERNIA REPAIR N/A 03/15/2015   Procedure: UMBILICAL HERNIA REPAIR WITH MESH;  Surgeon: Abigail Miyamoto, MD;  Location: [facility] OR;  Service: General;  Laterality: N/A;    No family history on file.  Not on File  Current Outpatient Medications on File Prior to Visit  Medication Sig Dispense Refill   Accu-Chek Softclix Lancets lancets  Use to check Blood sugars once a day. Dx E11.8 100 each 1   acetaminophen (TYLENOL) 325 MG tablet Take 2 tablets (650 mg total) by mouth every 4 (four) hours as needed for mild pain (or temp > 37.5 C (99.5 F)).     albuterol (VENTOLIN HFA) 108 (90 Base) MCG/ACT inhaler Inhale 2 puffs into the lungs every 6 (six) hours as needed. 18 g 0   amLODipine (NORVASC) 10 MG tablet TAKE 1 TABLET(10 MG) BY MOUTH DAILY 90 tablet 0   aspirin 81 MG chewable tablet Chew 1 tablet (81 mg total) by mouth daily.     atorvastatin (LIPITOR) 80 MG tablet TAKE 1 TABLET(80 MG) BY MOUTH DAILY 30 tablet 5   Blood Glucose Monitoring Suppl (ACCU-CHEK GUIDE ME) w/Device KIT Use to check Blood sugars once a day. Dx E11.8 1 kit 0   buPROPion (WELLBUTRIN XL) 150 MG 24 hr tablet Take 1 tablet (150 mg total) by mouth daily. 30 tablet 3   glucose blood (ACCU-CHEK GUIDE) test strip Use to check Blood sugars once a day. Dx E11.8 100 each 1   losartan (COZAAR) 50 MG tablet Take 1 tablet (50 mg total) by mouth daily. 90 tablet 1   metFORMIN (GLUCOPHAGE) 500 MG tablet Take 1 tablet (500 mg total) by mouth 2 (two) times daily with a meal. 60 tablet 3   methylPREDNISolone (MEDROL) 4 MG tablet Standard 6 day taper dose 21 tablet 0   traMADol (ULTRAM) 50 MG tablet Take 1 tablet (50 mg total) by mouth 3 (three) times daily as needed. 30 tablet 0   pantoprazole (PROTONIX) 40 MG tablet TAKE 1 TABLET (40 MG TOTAL) BY MOUTH DAILY. 30 tablet 0   traZODone (DESYREL) 100 MG tablet TAKE 1 TABLET (100 MG TOTAL) BY MOUTH AT BEDTIME AS NEEDED FOR SLEEP. 30 tablet 0   No current facility-administered medications on file prior to visit.    BP (!) 170/86   Pulse 65   Temp 98.5 F (36.9 C)   Resp 18   Ht 5\' 7"  (1.702 m)   Wt 219 lb (99.3 kg)   SpO2 99%   BMI 34.30 kg/m         Objective:   Physical Exam  General Mental Status- Alert. General Appearance- Not in acute distress.   Skin General: Color- Normal Color. Moisture- Normal  Moisture.  Neck Carotid Arteries- Normal color. Moisture- Normal Moisture. No carotid bruits. No JVD.  Chest and Lung Exam Auscultation: Breath Sounds:-Normal.  Cardiovascular Auscultation:Rythm- Regular. Murmurs & Other Heart Sounds:Auscultation of the heart reveals- No Murmurs.  Neurologic Cranial Nerve exam:- CN III-XII intact(No nystagmus), symmetric smile. Drift Test:- No drift. Finger to Nose:- Normal/Intact Strength:- 5/5 equal and symmetric strength both upper and lower extremities.   Left shoulder- pain on rom.No pain  palpation anterior and lateral aspect.     Assessment & Plan:   Patient Instructions  Left shoulder pain recurrent stiffness with pain on  range of motion as before. Refer back to ortho for evaluation and treatment. Possible injection again. Use tylenol for pain and rx flexeril to use at night.  Htn- please restart losartan daily and continue amlodipine both daily. Please don't skip med. Check bp daily over next week. Not to use nsaid alleve or ibuprofen as that can increase bp.  Follow up in one week virtual visit to review bp levels over week and day of visit.     Esperanza Richters, PA-C

## 2022-07-02 ENCOUNTER — Encounter: Payer: Self-pay | Admitting: Physical Medicine and Rehabilitation

## 2022-07-05 ENCOUNTER — Ambulatory Visit: Payer: Medicaid Other | Admitting: Physical Medicine and Rehabilitation

## 2022-07-05 ENCOUNTER — Encounter: Payer: Self-pay | Admitting: Physical Medicine and Rehabilitation

## 2022-07-05 ENCOUNTER — Ambulatory Visit: Payer: Self-pay

## 2022-07-05 DIAGNOSIS — G8929 Other chronic pain: Secondary | ICD-10-CM

## 2022-07-05 DIAGNOSIS — M25552 Pain in left hip: Secondary | ICD-10-CM

## 2022-07-05 DIAGNOSIS — M25512 Pain in left shoulder: Secondary | ICD-10-CM

## 2022-07-05 NOTE — Progress Notes (Signed)
   Aaron Gibson - 56 y.o. male MRN [id number]  Date of birth: 03-Nov-1966  Office Visit Note: Visit Date: 07/05/2022 PCP: Esperanza Richters, PA-C Referred by: Marisue Brooklyn  Subjective: Chief Complaint  Patient presents with   Left Shoulder - Pain   HPI:  Aaron Gibson is a 56 y.o. male who comes in today at the request of Dr. Glee Arvin for planned Left anesthetic glenohumeral arthrogram with fluoroscopic guidance.  The patient has failed conservative care including home exercise, medications, time and activity modification.  This injection will be diagnostic and hopefully therapeutic.  Please see requesting physician notes for further details and justification.   ROS Otherwise per HPI.  Assessment & Plan: Visit Diagnoses:    ICD-10-CM   1. Chronic left shoulder pain  M25.512 Large Joint Inj: L hip joint   G89.29 XR C-ARM NO REPORT      Plan: No additional findings.   Meds & Orders: No orders of the defined types were placed in this encounter.   Orders Placed This Encounter  Procedures   Large Joint Inj: L hip joint   Large Joint Inj   XR C-ARM NO REPORT    Follow-up: Return for visit to requesting provider as needed.   Procedures: Large Joint Inj: L glenohumeral on 07/05/2022 3:30 PM Indications: pain and diagnostic evaluation Details: 22 G 3.5 in needle, fluoroscopy-guided anteromedial approach  Arthrogram: No  Medications: 40 mg triamcinolone acetonide 40 MG/ML; 5 mL bupivacaine 0.25 % Outcome: tolerated well, no immediate complications  There was excellent flow of contrast producing a partial arthrogram of the glenohumeral joint. The patient did have relief of symptoms during the anesthetic phase of the injection. Procedure, treatment alternatives, risks and benefits explained, specific risks discussed. Consent was given by the patient. Immediately prior to procedure a time out was called to verify the correct patient, procedure, equipment, support staff and  site/side marked as required. Patient was prepped and draped in the usual sterile fashion.          Clinical History: No specialty comments available.     Objective:  VS:  HT:    WT:   BMI:     BP:   HR: bpm  TEMP: ( )  RESP:  Physical Exam   Imaging: No results found.

## 2022-07-05 NOTE — Progress Notes (Signed)
Pt state left shoulder pain. Pt state any movement makes the pain worse. Pt state he takes over the counter pain meds to help ease his pain.  Numeric Pain Rating Scale and Functional Assessment Average Pain 10   In the last MONTH (on 0-10 scale) has pain interfered with the following?  1. General activity like being  able to carry out your everyday physical activities such as walking, climbing stairs, carrying groceries, or moving a chair?  Rating(10)   +Driver, -BT, -Dye Allergies.

## 2022-07-11 ENCOUNTER — Other Ambulatory Visit: Payer: Self-pay | Admitting: Medical

## 2022-07-11 MED ORDER — CYCLOBENZAPRINE HCL 5 MG PO TABS: ORAL_TABLET | ORAL | 0 refills | Status: DC

## 2022-07-11 NOTE — Telephone Encounter (Signed)
Rx flexeril refill sent.

## 2022-07-12 ENCOUNTER — Encounter: Payer: Self-pay | Admitting: Orthopaedic Surgery

## 2022-07-12 ENCOUNTER — Ambulatory Visit: Payer: Medicaid Other | Admitting: Orthopaedic Surgery

## 2022-07-12 DIAGNOSIS — M75112 Incomplete rotator cuff tear or rupture of left shoulder, not specified as traumatic: Secondary | ICD-10-CM | POA: Diagnosis not present

## 2022-07-12 DIAGNOSIS — M7542 Impingement syndrome of left shoulder: Secondary | ICD-10-CM | POA: Diagnosis not present

## 2022-07-12 NOTE — Progress Notes (Signed)
Office Visit Note   Patient: Aaron Gibson           Date of Birth: 1966/04/27           MRN: [id number] Visit Date: 07/12/2022              Requested by: Esperanza Richters, PA-C [address] [place],  [place] [address] PCP: Esperanza Richters, PA-C   Assessment & Plan: Visit Diagnoses:  1. Impingement syndrome of left shoulder   2. Partial nontraumatic tear of left rotator cuff     Plan: Impression is chronic left shoulder pain with partial rotator cuff tear and impingement and proximal biceps tendinopathy.  He has undergone 2 injections at this point and has done extensive physical therapy without significant improvement.  Based on his options he has elected to move forward with left shoulder arthroscopy examination under anesthesia and possible biceps tenodesis.  Risks benefits prognosis reviewed with the patient in detail.  He understands that this may not provide if full pain relief but he is willing to undergo the surgery.  We will delay surgery until August due to recent cortisone injection.  Total face to face encounter time was greater than 25 minutes and over half of this time was spent in counseling and/or coordination of care.  Follow-Up Instructions: No follow-ups on file.   Orders:  No orders of the defined types were placed in this encounter.  No orders of the defined types were placed in this encounter.     Procedures: No procedures performed   Clinical Data: No additional findings.   Subjective: Chief Complaint  Patient presents with   Left Shoulder - Pain    HPI  Vu returns today for chronic left shoulder pain.  He has had 2 cortisone injections with temporary relief.  Continues to have pain along the anterior shoulder and joint line worse with forward flexion.  Review of Systems   Objective: Vital Signs: There were no vitals taken for this visit.  Physical Exam  Ortho Exam  Examination of left shoulder shows near normal  passive range of motion with pain towards the extremes.  Manual muscle testing of the rotator cuff is normal with moderate pain.  Pain with Speed test.  Pain with impingement test.  No pain at the [facility] joint.  No evidence of CRPS.  Specialty Comments:  No specialty comments available.  Imaging: No results found.   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  Past Surgical History:  Procedure Laterality Date   BUBBLE STUDY  01/02/2021   Procedure: BUBBLE STUDY;  Surgeon: Meriam Sprague, MD;  Location: [facility] ENDOSCOPY;  Service: Cardiovascular;;   TEE WITHOUT CARDIOVERSION N/A 01/02/2021   Procedure: TRANSESOPHAGEAL ECHOCARDIOGRAM (TEE);  Surgeon: Meriam Sprague, MD;  Location: [facility] ENDOSCOPY;  Service: Cardiovascular;  Laterality: N/A;   UMBILICAL HERNIA  REPAIR N/A 03/15/2015   Procedure: UMBILICAL HERNIA REPAIR WITH MESH;  Surgeon: Abigail Miyamoto, MD;  Location: [facility] OR;  Service: General;  Laterality: N/A;   Social History   Occupational History   Occupation: Unemployed   Tobacco Use   Smoking status: Former    Packs/day: 1.00    Years: 29.00    Total pack years: 29.00    Types: Cigarettes   Smokeless tobacco: Never   Tobacco comments:    quit 12/28/2020  Vaping Use   Vaping Use: Never used  Substance and Sexual Activity   Alcohol use: Yes    Comment: occasionally  holidays   Drug use: No   Sexual activity: Yes

## 2022-07-23 MED ORDER — BUPIVACAINE HCL 0.25 % IJ SOLN
5.0000 mL | INTRAMUSCULAR | Status: AC | PRN
  Administered 2022-07-05: 5 mL via INTRA_ARTICULAR

## 2022-07-23 MED ORDER — TRIAMCINOLONE ACETONIDE 40 MG/ML IJ SUSP
40.0000 mg | INTRAMUSCULAR | Status: AC | PRN
  Administered 2022-07-05: 40 mg via INTRA_ARTICULAR

## 2022-07-24 ENCOUNTER — Telehealth: Payer: Self-pay

## 2022-07-24 NOTE — Telephone Encounter (Signed)
No that's ok.  We don't have to do 29826.  So we can still do surgery though?

## 2022-07-24 NOTE — Telephone Encounter (Signed)
Surgery scheduled for 08/01/22. [employer] approved codes 29823 and 29828 and denied [id number]. Please advise if you want me to set up P2P?  Rationale:  29826 SURGICAL ARTHROSCOPY SHO W/CORACOACRM LIGM RLS Left  Non-Authorized Criteria Not Met  Clinical Rationale: Your doctor told us that you have shoulder pain. Your doctor wants to do surgery to remove part of the surface of a bone. This bone is called the acromion. This surgery is called acromioplasty. We reviewed the notes we received. The notes show (per medical studies to date) that this part of the surgery is not medically necessary. We used [employer] Benefits Management Clinical Guideline titled Joint Surgery, Shoulder Arthroscopy and Open Procedures to make this decision. You may view this guideline at [email or web address]/mbm-guidelines-[email or web address]. 

## 2022-07-25 ENCOUNTER — Telehealth: Payer: Self-pay | Admitting: Orthopaedic Surgery

## 2022-07-25 ENCOUNTER — Encounter: Payer: Medicaid Other | Admitting: Orthopaedic Surgery

## 2022-07-25 NOTE — Telephone Encounter (Signed)
Patient called asking where he's suppose to go today for some preop testing? He received a call from a nurse but threw away the directions that were given and he said he's suppose to be some where at 9 this morning but I didn't see anything on the schedule. If he could get a call back asap [phone]

## 2022-07-31 ENCOUNTER — Other Ambulatory Visit: Payer: Self-pay

## 2022-07-31 ENCOUNTER — Encounter (HOSPITAL_COMMUNITY): Payer: Self-pay | Admitting: Orthopaedic Surgery

## 2022-07-31 NOTE — Progress Notes (Signed)
PCP - Dr. Alvira Monday  Cardiologist - Denies  EP- Denies  Endocrine- Denies  Pulm- Denies  Chest x-ray - Denies  EKG - 08/01/22- Day of surgery  Stress Test - Denies  ECHO - 12/29/20 (E)  Cardiac Cath - Denies  AICD-na PM-na LOOP-na  Nerve Stimulator- Denies  Dialysis- Denies  Sleep Study - Denies CPAP - Denies  LABS- 08/01/22: CBC, BMP  ASA- Denies  ERAS- Yes- clears until 1145  HA1C- 08/25/21(E): 6.6 Fasting Blood Sugar - 102-116 Checks Blood Sugar ___3__ times a week  Anesthesia- No  Pt denies having chest pain, sob, or fever during the pre-op phone call. All instructions explained to the pt, with a verbal understanding of the material including: as of today, stop taking all Aspirin (unless instructed by your doctor) and Other Aspirin containing products, Vitamins, Fish oils, and Herbal medications. Also stop all NSAIDS i.e. Advil, Ibuprofen, Motrin, Aleve, Anaprox, Naproxen, BC, Goody Powders, and all Supplements.    WHAT DO I DO ABOUT MY DIABETES MEDICATION?  Do not take Metformin the morning of surgery.  If your CBG is greater than 220 mg/dL, call the number below for further orders  How to Manage Your Diabetes Before and After Surgery  How do I manage my blood sugar before surgery? Check your blood sugar the morning of your surgery when you wake up and every 2 hours until you get to the Short Stay unit. If your blood sugar is less than 70 mg/dL, you will need to treat for low blood sugar: Do not take insulin. Treat a low blood sugar (less than 70 mg/dL) with  cup of clear juice (cranberry or apple), 4 glucose tablets, OR glucose gel. Recheck blood sugar in 15 minutes after treatment (to make sure it is greater than 70 mg/dL). If your blood sugar is not greater than 70 mg/dL on recheck, call [phone]  for further instructions. Report your blood sugar to the short stay nurse when you get to Short Stay.  Reviewed and Endorsed by [facility] Patient  Education Committee, August 2015  Pt also instructed to wear a mask and social if he goes out. The opportunity to ask questions was provided.

## 2022-08-01 ENCOUNTER — Ambulatory Visit (HOSPITAL_BASED_OUTPATIENT_CLINIC_OR_DEPARTMENT_OTHER): Payer: Medicaid Other | Admitting: Certified Registered Nurse Anesthetist

## 2022-08-01 ENCOUNTER — Encounter: Payer: Self-pay | Admitting: Orthopaedic Surgery

## 2022-08-01 ENCOUNTER — Other Ambulatory Visit: Payer: Self-pay

## 2022-08-01 ENCOUNTER — Ambulatory Visit (HOSPITAL_COMMUNITY)
Admission: RE | Admit: 2022-08-01 | Discharge: 2022-08-01 | Disposition: A | Discharge status: Home / Self Care | Payer: Medicaid Other | Source: Ambulatory Visit | Attending: Orthopaedic Surgery | Admitting: Orthopaedic Surgery

## 2022-08-01 ENCOUNTER — Encounter (HOSPITAL_COMMUNITY): Payer: Self-pay | Admitting: Orthopaedic Surgery

## 2022-08-01 ENCOUNTER — Ambulatory Visit (HOSPITAL_COMMUNITY): Payer: Medicaid Other | Admitting: Certified Registered Nurse Anesthetist

## 2022-08-01 ENCOUNTER — Encounter (HOSPITAL_COMMUNITY)
Admission: RE | Disposition: A | Discharge status: Home / Self Care | Payer: Self-pay | Source: Ambulatory Visit | Attending: Orthopaedic Surgery

## 2022-08-01 DIAGNOSIS — M75112 Incomplete rotator cuff tear or rupture of left shoulder, not specified as traumatic: Secondary | ICD-10-CM | POA: Diagnosis present

## 2022-08-01 DIAGNOSIS — M7502 Adhesive capsulitis of left shoulder: Secondary | ICD-10-CM | POA: Insufficient documentation

## 2022-08-01 DIAGNOSIS — X58XXXA Exposure to other specified factors, initial encounter: Secondary | ICD-10-CM | POA: Diagnosis not present

## 2022-08-01 DIAGNOSIS — Z7984 Long term (current) use of oral hypoglycemic drugs: Secondary | ICD-10-CM | POA: Insufficient documentation

## 2022-08-01 DIAGNOSIS — F17210 Nicotine dependence, cigarettes, uncomplicated: Secondary | ICD-10-CM

## 2022-08-01 DIAGNOSIS — M1990 Unspecified osteoarthritis, unspecified site: Secondary | ICD-10-CM | POA: Insufficient documentation

## 2022-08-01 DIAGNOSIS — Z79899 Other long term (current) drug therapy: Secondary | ICD-10-CM | POA: Diagnosis not present

## 2022-08-01 DIAGNOSIS — F17200 Nicotine dependence, unspecified, uncomplicated: Secondary | ICD-10-CM | POA: Diagnosis not present

## 2022-08-01 DIAGNOSIS — M7522 Bicipital tendinitis, left shoulder: Secondary | ICD-10-CM | POA: Diagnosis not present

## 2022-08-01 DIAGNOSIS — E119 Type 2 diabetes mellitus without complications: Secondary | ICD-10-CM | POA: Insufficient documentation

## 2022-08-01 DIAGNOSIS — M19012 Primary osteoarthritis, left shoulder: Secondary | ICD-10-CM | POA: Insufficient documentation

## 2022-08-01 DIAGNOSIS — G8918 Other acute postprocedural pain: Secondary | ICD-10-CM | POA: Diagnosis not present

## 2022-08-01 DIAGNOSIS — I10 Essential (primary) hypertension: Secondary | ICD-10-CM | POA: Insufficient documentation

## 2022-08-01 DIAGNOSIS — S46012A Strain of muscle(s) and tendon(s) of the rotator cuff of left shoulder, initial encounter: Secondary | ICD-10-CM | POA: Insufficient documentation

## 2022-08-01 DIAGNOSIS — M94212 Chondromalacia, left shoulder: Secondary | ICD-10-CM | POA: Insufficient documentation

## 2022-08-01 DIAGNOSIS — Z8673 Personal history of transient ischemic attack (TIA), and cerebral infarction without residual deficits: Secondary | ICD-10-CM | POA: Diagnosis not present

## 2022-08-01 DIAGNOSIS — S43432A Superior glenoid labrum lesion of left shoulder, initial encounter: Secondary | ICD-10-CM

## 2022-08-01 DIAGNOSIS — M7542 Impingement syndrome of left shoulder: Secondary | ICD-10-CM | POA: Diagnosis not present

## 2022-08-01 HISTORY — DX: Cerebral infarction, unspecified: I63.9

## 2022-08-01 HISTORY — PX: SHOULDER ARTHROSCOPY WITH SUBACROMIAL DECOMPRESSION, ROTATOR CUFF REPAIR AND BICEP TENDON REPAIR: SHX5687

## 2022-08-01 HISTORY — DX: Prediabetes: R73.03

## 2022-08-01 LAB — BASIC METABOLIC PANEL
Anion gap: 8 (ref 5–15)
BUN: 12 mg/dL (ref 6–20)
CO2: 26 mmol/L (ref 22–32)
Calcium: 8.9 mg/dL (ref 8.9–10.3)
Chloride: 105 mmol/L (ref 98–111)
Creatinine, Ser: 1.08 mg/dL (ref 0.61–1.24)
GFR, Estimated: >60 mL/min (ref >60)
Glucose, Bld: 98 mg/dL (ref 70–99)
Potassium: 3.5 mmol/L (ref 3.5–5.1)
Sodium: 139 mmol/L (ref 135–145)

## 2022-08-01 LAB — GLUCOSE, CAPILLARY: Glucose-Capillary: 115 mg/dL — ABNORMAL HIGH (ref 70–99)

## 2022-08-01 LAB — CBC
HCT: 47.6 % (ref 39.0–52.0)
Hemoglobin: 15.6 g/dL (ref 13.0–17.0)
MCH: 29.2 pg (ref 26.0–34.0)
MCHC: 32.8 g/dL (ref 30.0–36.0)
MCV: 89.1 fL (ref 80.0–100.0)
Platelets: 174 10*3/uL (ref 150–400)
RBC: 5.34 MIL/uL (ref 4.22–5.81)
RDW: 13.1 % (ref 11.5–15.5)
WBC: 11.3 10*3/uL — ABNORMAL HIGH (ref 4.0–10.5)
nRBC: 0 % (ref 0.0–0.2)

## 2022-08-01 SURGERY — SHOULDER ARTHROSCOPY WITH SUBACROMIAL DECOMPRESSION, ROTATOR CUFF REPAIR AND BICEP TENDON REPAIR
Anesthesia: General | Site: Shoulder | Laterality: Left

## 2022-08-01 MED ORDER — ROCURONIUM BROMIDE 10 MG/ML (PF) SYRINGE
PREFILLED_SYRINGE | INTRAVENOUS | Status: DC | PRN
  Administered 2022-08-01: 60 mg via INTRAVENOUS
  Administered 2022-08-01: 20 mg via INTRAVENOUS

## 2022-08-01 MED ORDER — CEFAZOLIN SODIUM-DEXTROSE 2-4 GM/100ML-% IV SOLN
2.0000 g | INTRAVENOUS | Status: AC
  Administered 2022-08-01: 2 g via INTRAVENOUS

## 2022-08-01 MED ORDER — HYDROMORPHONE HCL 1 MG/ML IJ SOLN
0.2500 mg | INTRAMUSCULAR | Status: DC | PRN
  Administered 2022-08-01: 0.5 mg via INTRAVENOUS
  Administered 2022-08-01 (×2): 0.25 mg via INTRAVENOUS
  Administered 2022-08-01: 0.5 mg via INTRAVENOUS

## 2022-08-01 MED ORDER — ONDANSETRON HCL 4 MG/2ML IJ SOLN
INTRAMUSCULAR | Status: DC | PRN
  Administered 2022-08-01: 4 mg via INTRAVENOUS

## 2022-08-01 MED ORDER — SODIUM CHLORIDE 0.9 % IR SOLN
Status: DC | PRN
  Administered 2022-08-01: 3000 mL

## 2022-08-01 MED ORDER — ACETAMINOPHEN 500 MG PO TABS
1000.0000 mg | ORAL_TABLET | Freq: Once | ORAL | Status: AC
  Administered 2022-08-01: 1000 mg via ORAL
  Filled 2022-08-01: qty 2

## 2022-08-01 MED ORDER — FENTANYL CITRATE (PF) 250 MCG/5ML IJ SOLN
INTRAMUSCULAR | Status: DC | PRN
  Administered 2022-08-01 (×3): 50 ug via INTRAVENOUS
  Administered 2022-08-01: 100 ug via INTRAVENOUS

## 2022-08-01 MED ORDER — LIDOCAINE 2% (20 MG/ML) 5 ML SYRINGE
INTRAMUSCULAR | Status: DC | PRN
  Administered 2022-08-01: 20 mg via INTRAVENOUS

## 2022-08-01 MED ORDER — BUPIVACAINE-EPINEPHRINE (PF) 0.5% -1:200000 IJ SOLN
INTRAMUSCULAR | Status: DC | PRN
  Administered 2022-08-01: 15 mL via PERINEURAL

## 2022-08-01 MED ORDER — FENTANYL CITRATE (PF) 250 MCG/5ML IJ SOLN
INTRAMUSCULAR | Status: AC
  Filled 2022-08-01: qty 5

## 2022-08-01 MED ORDER — ONDANSETRON HCL 4 MG PO TABS: 4.0000 mg | ORAL_TABLET | Freq: Three times a day (TID) | ORAL | 0 refills | Status: AC | PRN

## 2022-08-01 MED ORDER — BUPIVACAINE LIPOSOME 1.3 % IJ SUSP
INTRAMUSCULAR | Status: DC | PRN
  Administered 2022-08-01: 10 mL via PERINEURAL

## 2022-08-01 MED ORDER — LACTATED RINGERS IV SOLN: INTRAVENOUS | Status: DC

## 2022-08-01 MED ORDER — ONDANSETRON HCL 4 MG PO TABS: 4.0000 mg | ORAL_TABLET | Freq: Three times a day (TID) | ORAL | 0 refills | Status: DC | PRN

## 2022-08-01 MED ORDER — CEFAZOLIN SODIUM-DEXTROSE 2-4 GM/100ML-% IV SOLN
INTRAVENOUS | Status: AC
  Filled 2022-08-01: qty 100

## 2022-08-01 MED ORDER — PROPOFOL 10 MG/ML IV BOLUS
INTRAVENOUS | Status: DC | PRN
  Administered 2022-08-01: 200 mg via INTRAVENOUS

## 2022-08-01 MED ORDER — FENTANYL CITRATE (PF) 100 MCG/2ML IJ SOLN
INTRAMUSCULAR | Status: AC
  Filled 2022-08-01: qty 2

## 2022-08-01 MED ORDER — CHLORHEXIDINE GLUCONATE 0.12 % MT SOLN
OROMUCOSAL | Status: AC
  Administered 2022-08-01: 15 mL via OROMUCOSAL
  Filled 2022-08-01: qty 15

## 2022-08-01 MED ORDER — OXYCODONE-ACETAMINOPHEN 5-325 MG PO TABS: 1.0000 | ORAL_TABLET | Freq: Two times a day (BID) | ORAL | 0 refills | Status: DC | PRN

## 2022-08-01 MED ORDER — PHENYLEPHRINE 80 MCG/ML (10ML) SYRINGE FOR IV PUSH (FOR BLOOD PRESSURE SUPPORT)
PREFILLED_SYRINGE | INTRAVENOUS | Status: DC | PRN
  Administered 2022-08-01: 80 ug via INTRAVENOUS

## 2022-08-01 MED ORDER — CHLORHEXIDINE GLUCONATE 0.12 % MT SOLN
15.0000 mL | Freq: Once | OROMUCOSAL | Status: AC
Start: 2022-08-01 — End: 2022-08-01

## 2022-08-01 MED ORDER — SUGAMMADEX SODIUM 200 MG/2ML IV SOLN
INTRAVENOUS | Status: DC | PRN
  Administered 2022-08-01: 200 mg via INTRAVENOUS

## 2022-08-01 MED ORDER — MIDAZOLAM HCL 2 MG/2ML IJ SOLN
INTRAMUSCULAR | Status: AC
  Administered 2022-08-01: 2 mg via INTRAVENOUS
  Filled 2022-08-01: qty 2

## 2022-08-01 MED ORDER — HYDROMORPHONE HCL 1 MG/ML IJ SOLN
INTRAMUSCULAR | Status: AC
  Filled 2022-08-01: qty 1

## 2022-08-01 MED ORDER — MIDAZOLAM HCL 2 MG/2ML IJ SOLN
INTRAMUSCULAR | Status: AC
  Filled 2022-08-01: qty 2

## 2022-08-01 MED ORDER — PROPOFOL 10 MG/ML IV BOLUS
INTRAVENOUS | Status: AC
  Filled 2022-08-01: qty 20

## 2022-08-01 MED ORDER — FENTANYL CITRATE (PF) 100 MCG/2ML IJ SOLN: 50.0000 ug | Freq: Once | INTRAMUSCULAR | Status: AC

## 2022-08-01 MED ORDER — DEXAMETHASONE SODIUM PHOSPHATE 10 MG/ML IJ SOLN
INTRAMUSCULAR | Status: DC | PRN
  Administered 2022-08-01: 10 mg via INTRAVENOUS

## 2022-08-01 MED ORDER — FENTANYL CITRATE (PF) 100 MCG/2ML IJ SOLN
INTRAMUSCULAR | Status: AC
  Administered 2022-08-01: 50 ug via INTRAVENOUS
  Filled 2022-08-01: qty 2

## 2022-08-01 MED ORDER — ORAL CARE MOUTH RINSE: 15.0000 mL | Freq: Once | OROMUCOSAL | Status: AC

## 2022-08-01 MED ORDER — AMISULPRIDE (ANTIEMETIC) 5 MG/2ML IV SOLN: 10.0000 mg | Freq: Once | INTRAVENOUS | Status: DC | PRN

## 2022-08-01 MED ORDER — FENTANYL CITRATE (PF) 100 MCG/2ML IJ SOLN
25.0000 ug | INTRAMUSCULAR | Status: DC | PRN
  Administered 2022-08-01 (×3): 50 ug via INTRAVENOUS

## 2022-08-01 MED ORDER — BUPIVACAINE-EPINEPHRINE (PF) 0.25% -1:200000 IJ SOLN
INTRAMUSCULAR | Status: AC
  Filled 2022-08-01: qty 30

## 2022-08-01 MED ORDER — PHENYLEPHRINE HCL-NACL 20-0.9 MG/250ML-% IV SOLN
INTRAVENOUS | Status: DC | PRN
  Administered 2022-08-01: 15 ug/min via INTRAVENOUS

## 2022-08-01 MED ORDER — MIDAZOLAM HCL 2 MG/2ML IJ SOLN: 2.0000 mg | Freq: Once | INTRAMUSCULAR | Status: AC

## 2022-08-01 SURGICAL SUPPLY — 81 items
ANCHOR SUT 1.8 FIBERTAK SB KL (Anchor) ×1 IMPLANT
BENZOIN TINCTURE PRP APPL 2/3 (GAUZE/BANDAGES/DRESSINGS) IMPLANT
BLADE EXCALIBUR 4.0X13 (MISCELLANEOUS) IMPLANT
BLADE SURG 15 STRL LF DISP TIS (BLADE) IMPLANT
BLADE SURG 15 STRL SS (BLADE) ×1
BNDG COHESIVE 4X5 TAN STRL LF (GAUZE/BANDAGES/DRESSINGS) ×1 IMPLANT
BURR OVAL 8 FLU 4.0X13 (MISCELLANEOUS) ×2 IMPLANT
CANNULA 5.75X71 LONG (CANNULA) ×2 IMPLANT
CANNULA SHOULDER 7CM (CANNULA) ×2 IMPLANT
CANNULA TWIST IN 8.25X7CM (CANNULA) IMPLANT
COOLER ICEMAN CLASSIC (MISCELLANEOUS) ×2 IMPLANT
COVER SURGICAL LIGHT HANDLE (MISCELLANEOUS) ×1 IMPLANT
DERMABOND ADVANCED (GAUZE/BANDAGES/DRESSINGS)
DERMABOND ADVANCED .7 DNX12 (GAUZE/BANDAGES/DRESSINGS) IMPLANT
DISSECTOR  3.8MM X 13CM (MISCELLANEOUS) ×1
DISSECTOR 3.8MM X 13CM (MISCELLANEOUS) IMPLANT
DRAPE IMP U-DRAPE 54X76 (DRAPES) ×2 IMPLANT
DRAPE INCISE IOBAN 66X45 STRL (DRAPES) ×2 IMPLANT
DRAPE SHOULDER BEACH CHAIR (DRAPES) ×2 IMPLANT
DRAPE STERI 35X30 U-POUCH (DRAPES) ×2 IMPLANT
DRAPE SURG 17X23 STRL (DRAPES) ×2 IMPLANT
DRAPE U-SHAPE 47X51 STRL (DRAPES) ×2 IMPLANT
DRSG PAD ABDOMINAL 8X10 ST (GAUZE/BANDAGES/DRESSINGS) ×4 IMPLANT
DURAPREP 26ML APPLICATOR (WOUND CARE) ×2 IMPLANT
ELECT REM PT RETURN 9FT ADLT (ELECTROSURGICAL) ×2
ELECTRODE REM PT RTRN 9FT ADLT (ELECTROSURGICAL) ×1 IMPLANT
FIBER TAPE 2MM (SUTURE) IMPLANT
GAUZE SPONGE 4X4 12PLY STRL (GAUZE/BANDAGES/DRESSINGS) ×3 IMPLANT
GAUZE XEROFORM 1X8 LF (GAUZE/BANDAGES/DRESSINGS) ×2 IMPLANT
GLOVE ECLIPSE 7.0 STRL STRAW (GLOVE) ×2 IMPLANT
GLOVE INDICATOR 7.0 STRL GRN (GLOVE) ×2 IMPLANT
GLOVE INDICATOR 7.5 STRL GRN (GLOVE) ×2 IMPLANT
GLOVE SURG SYN 7.5  E (GLOVE) ×2
GLOVE SURG SYN 7.5 E (GLOVE) ×2 IMPLANT
GLOVE SURG SYN 7.5 PF PI (GLOVE) ×2 IMPLANT
GOWN STRL REIN XL XLG (GOWN DISPOSABLE) ×2 IMPLANT
GOWN STRL REUS W/ TWL LRG LVL3 (GOWN DISPOSABLE) ×1 IMPLANT
GOWN STRL REUS W/ TWL XL LVL3 (GOWN DISPOSABLE) ×1 IMPLANT
GOWN STRL REUS W/TWL LRG LVL3 (GOWN DISPOSABLE) ×1
GOWN STRL REUS W/TWL XL LVL3 (GOWN DISPOSABLE) ×1
KIT CVD SPEAR FBRTK 1.8 DRILL (KITS) ×1 IMPLANT
MANIFOLD NEPTUNE II (INSTRUMENTS) ×2 IMPLANT
NDL SCORPION MULTI FIRE (NEEDLE) IMPLANT
NEEDLE SCORPION MULTI FIRE (NEEDLE) IMPLANT
PACK ARTHROSCOPY DSU (CUSTOM PROCEDURE TRAY) ×2 IMPLANT
PACK BASIN DAY SURGERY FS (CUSTOM PROCEDURE TRAY) ×2 IMPLANT
PAD COLD SHLDR WRAP-ON (PAD) ×2 IMPLANT
PENCIL BUTTON HOLSTER BLD 10FT (ELECTRODE) ×1 IMPLANT
PORT APPOLLO RF 90DEGREE MULTI (SURGICAL WAND) ×2 IMPLANT
SHEET MEDIUM DRAPE 40X70 STRL (DRAPES) ×4 IMPLANT
SLEEVE SCD COMPRESS KNEE MED (STOCKING) ×2 IMPLANT
SLING ARM FOAM STRAP LRG (SOFTGOODS) ×1 IMPLANT
SPIKE FLUID TRANSFER (MISCELLANEOUS) IMPLANT
SPONGE T-LAP 18X18 ~~LOC~~+RFID (SPONGE) ×1 IMPLANT
SPONGE T-LAP 4X18 ~~LOC~~+RFID (SPONGE) ×1 IMPLANT
STRIP CLOSURE SKIN 1/2X4 (GAUZE/BANDAGES/DRESSINGS) IMPLANT
SUCTION FRAZIER HANDLE 10FR (MISCELLANEOUS) ×1
SUCTION TUBE FRAZIER 10FR DISP (MISCELLANEOUS) IMPLANT
SUPPORT WRAP ARM LG (MISCELLANEOUS) IMPLANT
SUT ETHILON 3 0 FSL (SUTURE) ×1 IMPLANT
SUT ETHILON 3 0 PS 1 (SUTURE) ×3 IMPLANT
SUT FIBERWIRE #2 38 T-5 BLUE (SUTURE)
SUT MNCRL AB 4-0 PS2 18 (SUTURE) ×1 IMPLANT
SUT PDS AB 1 CT  36 (SUTURE)
SUT PDS AB 1 CT 36 (SUTURE) IMPLANT
SUT TIGER TAPE 7 IN WHITE (SUTURE) IMPLANT
SUT VIC AB 2-0 CT1 27 (SUTURE) ×2
SUT VIC AB 2-0 CT1 TAPERPNT 27 (SUTURE) ×1 IMPLANT
SUTURE FIBERWR #2 38 T-5 BLUE (SUTURE) IMPLANT
SUTURE TAPE 1.3 40 TPR END (SUTURE) IMPLANT
SUTURE TAPE TIGERLINK 1.3MM BL (SUTURE) IMPLANT
SUTURETAPE 1.3 40 TPR END (SUTURE)
SUTURETAPE TIGERLINK 1.3MM BL (SUTURE)
SYR 50ML LL SCALE MARK (SYRINGE) IMPLANT
SYS FBRTK BUTTON 2.6 (Anchor) ×2 IMPLANT
SYSTEM FBRTK BUTTON 2.6 (Anchor) IMPLANT
TAPE CLOTH 4X10 WHT NS (GAUZE/BANDAGES/DRESSINGS) ×1 IMPLANT
TOWEL GREEN STERILE FF (TOWEL DISPOSABLE) ×2 IMPLANT
TUBE CONNECTING 20X1/4 (TUBING) IMPLANT
TUBING ARTHROSCOPY IRRIG 16FT (MISCELLANEOUS) IMPLANT
YANKAUER SUCT BULB TIP NO VENT (SUCTIONS) ×1 IMPLANT

## 2022-08-01 NOTE — Anesthesia Procedure Notes (Signed)
Procedure Name: Intubation Date/Time: 08/01/2022 3:00 PM  Performed by: Waynard Edwards, CRNAPre-anesthesia Checklist: Patient identified, Emergency Drugs available, Suction available and Patient being monitored Patient Re-evaluated:Patient Re-evaluated prior to induction Oxygen Delivery Method: Circle system utilized Preoxygenation: Pre-oxygenation with 100% oxygen Induction Type: IV induction Ventilation: Mask ventilation without difficulty Laryngoscope Size: Miller and 2 Grade View: Grade I Tube type: Oral Tube size: 7.5 mm Number of attempts: 1 Airway Equipment and Method: Stylet Placement Confirmation: ETT inserted through vocal cords under direct vision, positive ETCO2 and breath sounds checked- equal and bilateral Secured at: 23 cm Tube secured with: Tape Dental Injury: Teeth and Oropharynx as per pre-operative assessment

## 2022-08-01 NOTE — Op Note (Signed)
Date of Surgery: 08/01/2022  INDICATIONS: The patient is a 56 year old male with left shoulder pain that has failed conservative treatment;  The patient did consent to the procedure after discussion of the risks and benefits.  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 Glee Arvin, M.D.  ASSIST: Oneal Grout, PA-C  ANESTHESIA:  general, regional  IV FLUIDS AND URINE: See anesthesia.  ESTIMATED BLOOD LOSS: minimal mL.  IMPLANTS:  Implant Name Type Inv. Item Serial No. Manufacturer Lot No. LRB No. Used Action  SYS FBRTK BUTTON 2.6 - [id number] Anchor SYS FBRTK BUTTON 2.6  [employer] [id number] Left 1 Implanted  ANCHOR SUT 1.8 FIBERTAK SB KL - [id number] Anchor ANCHOR SUT 1.8 FIBERTAK SB KL  [employer] [id number] Left 1 Implanted    COMPLICATIONS: None.  DESCRIPTION OF PROCEDURE: The patient was brought to the operating room and placed supine on the operating table.  The patient had been signed prior to the procedure and this was documented. The patient had the anesthesia placed by the anesthesiologist.  A time-out was performed to confirm that this was the correct patient, site, side and location. The patient did receive antibiotics prior to the incision and was re-dosed during the  procedure as needed at indicated intervals.  The patient was then positioned into the beach chair position with all bony prominences well padded and neutral C spine. The patient had the operative extremity prepped and draped in the standard surgical fashion.    Examination of the shoulder joint under anesthesia demonstrated full range of motion in all planes without any evidence of adhesive capsulitis.  We then created standard posterior lateral and anterior shoulder arthroscopy portals and diagnostic shoulder arthroscopy ensued.  Rotator interval has some mild synovitis.  There was a tenosynovitis and moderate tendinopathy of the biceps tendon.  There was a degenerative SLAP tear as well as anterior labral tear.  These were both gently debrided back to stable margins.  Biceps tenotomy was performed with an ArthroCare wand to assist with biceps tenodesis.  We then found grade 3-4 changes on the humeral head and grade 2 changes on the glenoid surface.  The shoulder was then abducted and we found partial-thickness articular surface tearing of the superior cuff approximately 30%.  This was debrided back to stable margins.  The arthroscope was then repositioned into the subacromial space.  Bursectomy was performed.  The arthroscope and the instruments were removed and then a separate incision was made extending from the anterolateral corner of the acromion down the arm.  The subcutaneous tissue was elevated off of the deltoid muscle and the raphae was developed and the muscle belly was elevated off of the humeral head and subacromial bursa.  The remaining bursa was excised.  Inspection of the bursal surface of the rotator cuff was unremarkable.  We then externally rotated the arm in order to palpate the bicipital groove and the transverse humeral ligament was incised on the lateral border to then deliver  the biceps tendon.  The tendon was then whipstitched and the proper tension was found.  The bone within the  bicipital groove was abraded with a high-speed bur.  2 all suture anchors were then placed in the bicipital groove to perform the tenodesis.  The surgical site was then thoroughly irrigated and closed with 2-0 Vicryl and 3-0 nylon.  The portals were closed with nylon.  Sterile dressings were applied.  Shoulder sling was placed.  Patient tolerated procedure well had no major complications. Tessa Lerner, my PA, was a medical necessity for the entirety of the surgery including opening, closing, limb positioning, retracting, exposing, and repairing.  POSTOPERATIVE PLAN: Patient will be discharged home.  He is to wear the sling for comfort and may wean as tolerated.  He is limited to a 5 pound lifting restriction.  Follow-up next week in the office for recheck of the surgical sites.  Mayra Reel, MD [place] 4:42 PM

## 2022-08-01 NOTE — Anesthesia Preprocedure Evaluation (Signed)
Anesthesia Evaluation  Patient identified by MRN, date of birth, ID band Patient awake    Reviewed: Allergy & Precautions, NPO status , Patient's Chart, lab work & pertinent test results  Airway Mallampati: II  TM Distance: >3 FB Neck ROM: Full    Dental  (+) Dental Advisory Given   Pulmonary Current Smoker,    breath sounds clear to auscultation       Cardiovascular hypertension, Pt. on medications  Rhythm:Regular Rate:Normal     Neuro/Psych CVA    GI/Hepatic negative GI ROS, Neg liver ROS,   Endo/Other  diabetes, Type 2, Oral Hypoglycemic Agents  Renal/GU negative Renal ROS     Musculoskeletal  (+) Arthritis ,   Abdominal   Peds  Hematology negative hematology ROS (+)   Anesthesia Other Findings   Reproductive/Obstetrics                             Lab Results  Component Value Date   WBC 9.3 09/29/2021   HGB 15.4 09/29/2021   HCT 46.7 09/29/2021   MCV 88.9 09/29/2021   PLT 142.0 (L) 09/29/2021   Lab Results  Component Value Date   CREATININE 0.91 08/25/2021   BUN 11 08/25/2021   NA 141 08/25/2021   K 4.0 08/25/2021   CL 105 08/25/2021   CO2 27 08/25/2021    Anesthesia Physical Anesthesia Plan  ASA: 3  Anesthesia Plan: General   Post-op Pain Management: Regional block* and Tylenol PO (pre-op)*   Induction: Intravenous  PONV Risk Score and Plan: 1 and Dexamethasone, Ondansetron and Treatment may vary due to age or medical condition  Airway Management Planned: Oral ETT  Additional Equipment: None  Intra-op Plan:   Post-operative Plan: Extubation in OR  Informed Consent: I have reviewed the patients History and Physical, chart, labs and discussed the procedure including the risks, benefits and alternatives for the proposed anesthesia with the patient or authorized representative who has indicated his/her understanding and acceptance.     Dental advisory  given  Plan Discussed with: CRNA  Anesthesia Plan Comments:         Anesthesia Quick Evaluation

## 2022-08-01 NOTE — H&P (Signed)
PREOPERATIVE H&P  Chief Complaint: left shoulder partial rotator cuff tear , impingement, biceps tendinopathy  HPI: Aaron Gibson is a 56 y.o. male who presents for surgical treatment of left shoulder partial rotator cuff tear , impingement, biceps tendinopathy.  He denies any changes in medical history.  Past Medical History:  Diagnosis Date   Hypertension    untreated   Morbid obesity ([place])    Pre-diabetes    per patient report   Stroke [facility])    2021- left sided weakness per patient report   Past Surgical History:  Procedure Laterality Date   BUBBLE STUDY  01/02/2021   Procedure: BUBBLE STUDY;  Surgeon: Freada Bergeron, MD;  Location: [place];  Service: Cardiovascular;;   TEE WITHOUT CARDIOVERSION N/A 01/02/2021   Procedure: TRANSESOPHAGEAL ECHOCARDIOGRAM (TEE);  Surgeon: Freada Bergeron, MD;  Location: [facility] ENDOSCOPY;  Service: Cardiovascular;  Laterality: N/A;   TONSILLECTOMY     UMBILICAL HERNIA REPAIR N/A 03/15/2015   Procedure: UMBILICAL HERNIA REPAIR WITH MESH;  Surgeon: Coralie Keens, MD;  Location: [place];  Service: General;  Laterality: N/A;   Social History   Socioeconomic History   Marital status: Single    Spouse name: Not on file   Number of children: Not on file   Years of education: Not on file   Highest education level: Not on file  Occupational History   Occupation: Unemployed   Tobacco Use   Smoking status: Every Day    Packs/day: 0.50    Years: 29.00    Total pack years: 14.50    Types: Cigarettes   Smokeless tobacco: Never   Tobacco comments:    quit 12/28/2020  Vaping Use   Vaping Use: Never used  Substance and Sexual Activity   Alcohol use: Yes    Comment: occasionally holidays   Drug use: No   Sexual activity: Yes  Other Topics Concern   Not on file  Social History Narrative   Lives with daughter   Right Handed   Caffeine 2x week   Social Determinants of Health   Financial Resource Strain: Not on file  Food  Insecurity: Not on file  Transportation Needs: Not on file  Physical Activity: Not on file  Stress: Not on file  Social Connections: Not on file   History reviewed. No pertinent family history. No Known Allergies Prior to Admission medications   Medication Sig Start Date End Date Taking? Authorizing Provider  acetaminophen (TYLENOL) 325 MG tablet Take 2 tablets (650 mg total) by mouth every 4 (four) hours as needed for mild pain (or temp > 37.5 C (99.5 F)). 01/02/21  Yes Bailey-Modzik, Delila A, NP  amLODipine (NORVASC) 10 MG tablet TAKE 1 TABLET(10 MG) BY MOUTH DAILY 03/14/22  Yes Saguier, Percell Miller, PA-C  aspirin 81 MG chewable tablet Chew 1 tablet (81 mg total) by mouth daily. 01/03/21  Yes Bailey-Modzik, Delila A, NP  atorvastatin (LIPITOR) 80 MG tablet TAKE 1 TABLET(80 MG) BY MOUTH DAILY 08/17/21  Yes Saguier, Percell Miller, PA-C  cyclobenzaprine (FLEXERIL) 5 MG tablet 1 tab po q hs prn muscle pain/shoulder tightness 07/11/22  Yes Saguier, Percell Miller, PA-C  losartan (COZAAR) 50 MG tablet Take 1 tablet (50 mg total) by mouth daily. 09/29/21  Yes Saguier, Percell Miller, PA-C  metFORMIN (GLUCOPHAGE) 500 MG tablet Take 1 tablet (500 mg total) by mouth 2 (two) times daily with a meal. Patient taking differently: Take 500 mg by mouth daily with breakfast. 01/26/21  Yes Saguier, Percell Miller, PA-C  Multiple Vitamins-Minerals (  MULTIVITAMIN WITH MINERALS) tablet Take 1 tablet by mouth daily.   Yes [provider]  ondansetron (ZOFRAN) 4 MG tablet Take 1-2 tablets (4-8 mg total) by mouth every 8 (eight) hours as needed for nausea or vomiting. 08/01/22  Yes Leandrew Koyanagi, MD  oxyCODONE-acetaminophen (PERCOCET) 5-325 MG tablet Take 1-2 tablets by mouth 2 (two) times daily as needed for severe pain. 08/01/22  Yes Leandrew Koyanagi, MD  Accu-Chek Softclix Lancets lancets Use to check Blood sugars once a day. Dx E11.8 02/17/21   Saguier, Percell Miller, PA-C  albuterol (VENTOLIN HFA) 108 (90 Base) MCG/ACT inhaler Inhale 2 puffs into the lungs  every 6 (six) hours as needed. 09/29/21   Saguier, Percell Miller, PA-C  Blood Glucose Monitoring Suppl (ACCU-CHEK GUIDE ME) w/Device KIT Use to check Blood sugars once a day. Dx E11.8 02/17/21   Saguier, Percell Miller, PA-C  buPROPion (WELLBUTRIN XL) 150 MG 24 hr tablet Take 1 tablet (150 mg total) by mouth daily. Patient not taking: Reported on 07/25/2022 01/26/21   Saguier, Percell Miller, PA-C  glucose blood (ACCU-CHEK GUIDE) test strip Use to check Blood sugars once a day. Dx E11.8 02/17/21   Saguier, Percell Miller, PA-C     Positive ROS: All other systems have been reviewed and were otherwise negative with the exception of those mentioned in the HPI and as above.  Physical Exam: General: Alert, no acute distress Cardiovascular: No pedal edema Respiratory: No cyanosis, no use of accessory musculature GI: abdomen soft Skin: No lesions in the area of chief complaint Neurologic: Sensation intact distally Psychiatric: Patient is competent for consent with normal mood and affect Lymphatic: no lymphedema  MUSCULOSKELETAL: exam stable  Assessment: left shoulder partial rotator cuff tear , impingement, biceps tendinopathy  Plan: Plan for Procedure(s): LEFT SHOULDER SCOPE, EXTENSIVE DEBRIDEMENT, SUBACROMIAL DECOMPRESSION, BICEPS TENODESIS.  The risks benefits and alternatives were discussed with the patient including but not limited to the risks of nonoperative treatment, versus surgical intervention including infection, bleeding, nerve injury,  blood clots, cardiopulmonary complications, morbidity, mortality, among others, and they were willing to proceed.   Eduard Roux, MD 08/01/2022 2:35 PM

## 2022-08-01 NOTE — Transfer of Care (Signed)
Immediate Anesthesia Transfer of Care Note  Patient: Aaron Gibson  Procedure(s) Performed: LEFT SHOULDER SCOPE, EXTENSIVE DEBRIDEMENT, SUBACROMIAL DECOMPRESSION, BICEPS TENODESIS. (Left: Shoulder)  Patient Location: PACU  Anesthesia Type:GA combined with regional for post-op pain  Level of Consciousness: drowsy and patient cooperative  Airway & Oxygen Therapy: Patient Spontanous Breathing and Patient connected to nasal cannula oxygen  Post-op Assessment: Report given to RN and Post -op Vital signs reviewed and stable  Post vital signs: Reviewed and stable, see PACU VSS flowsheets  Last Vitals:  Vitals Value Taken Time  BP    Temp    Pulse    Resp    SpO2      Last Pain:  Vitals:   08/01/22 1405  TempSrc:   PainSc: 0-No pain      Patients Stated Pain Goal: 2 (07/31/22 1209)  Complications: No notable events documented.

## 2022-08-01 NOTE — Anesthesia Procedure Notes (Signed)
Anesthesia Regional Block: Interscalene brachial plexus block   Pre-Anesthetic Checklist: , timeout performed,  Correct Patient, Correct Site, Correct Laterality,  Correct Procedure, Correct Position, site marked,  Risks and benefits discussed,  Surgical consent,  Pre-op evaluation,  At surgeon's request and post-op pain management  Laterality: Left  Prep: chloraprep       Needles:  Injection technique: Single-shot  Needle Type: Echogenic Stimulator Needle     Needle Length: 9cm  Needle Gauge: 21     Additional Needles:   Procedures:, nerve stimulator,,, ultrasound used (permanent image in chart),,     Nerve Stimulator or Paresthesia:  Response: deltoid and biceps, 0.5 mA  Additional Responses:   Narrative:  Start time: 08/01/2022 1:49 PM End time: 08/01/2022 1:56 PM Injection made incrementally with aspirations every 5 mL.  Performed by: Personally  Anesthesiologist: Marcene Duos, MD

## 2022-08-01 NOTE — Discharge Instructions (Signed)
    Post-operative patient instructions  Shoulder Arthroscopy   Ice:  Place intermittent ice or cooler pack over your shoulder, 30 minutes on and 30 minutes off.  Continue this for the first 72 hours after surgery, then save ice for use after therapy sessions or on more active days.   Weight:  You may bear weight on your arm as your symptoms allow. Motion:  Perform gentle shoulder motion as tolerated Dressing:  Perform 1st dressing change at 2 days postoperative. A moderate amount of blood tinged drainage is to be expected.  So if you bleed through the dressing on the first or second day or if you have fevers, it is fine to change the dressing/check the wounds early and redress wound.  If it bleeds through again, or if the incisions are leaking frank blood, please call the office. May change dressing every 1-2 days thereafter to help watch wounds. Can purchase Tegaderm (or 3M Nexcare) water resistant dressings at local pharmacy / [facility]. Shower:  Light shower is ok after 2 days.  Please take shower, NO bath. Recover with gauze and ace wrap to help keep wounds protected.   Pain medication:  A narcotic pain medication has been prescribed.  Take as directed.  Typically you need narcotic pain medication more regularly during the first 3 to 5 days after surgery.  Decrease your use of the medication as the pain improves.  Narcotics can sometimes cause constipation, even after a few doses.  If you have problems with constipation, you can take an over the counter stool softener or light laxative.  If you have persistent problems, please notify your physician's office. Physical therapy: Additional activity guidelines to be provided by your physician or physical therapist at follow-up visits.  Driving: Do not recommend driving x 2 weeks post surgical, especially if surgery performed on right side. Should not drive while taking narcotic pain medications. It typically takes at least 2 weeks to restore sufficient  neuromuscular function for normal reaction times for driving safety.  Call [phone] for questions or problems. Evenings you will be forwarded to the hospital operator.  Ask for the orthopaedic physician on call. Please call if you experience:    Redness, foul smelling, or persistent drainage from the surgical site  worsening shoulder pain and swelling not responsive to medication  any calf pain and or swelling of the lower leg  temperatures greater than 101.5 F other questions or concerns   Thank you for allowing us to be a part of your care.   

## 2022-08-02 ENCOUNTER — Encounter (HOSPITAL_COMMUNITY): Payer: Self-pay | Admitting: Orthopaedic Surgery

## 2022-08-02 NOTE — Anesthesia Postprocedure Evaluation (Signed)
Anesthesia Post Note  Patient: Neko Boyajian  Procedure(s) Performed: LEFT SHOULDER SCOPE, EXTENSIVE DEBRIDEMENT, SUBACROMIAL DECOMPRESSION, BICEPS TENODESIS. (Left: Shoulder)     Patient location during evaluation: PACU Anesthesia Type: General Level of consciousness: awake and alert Pain management: pain level controlled Vital Signs Assessment: post-procedure vital signs reviewed and stable Respiratory status: spontaneous breathing, nonlabored ventilation, respiratory function stable and patient connected to nasal cannula oxygen Cardiovascular status: blood pressure returned to baseline and stable Postop Assessment: no apparent nausea or vomiting Anesthetic complications: no   No notable events documented.              Shelton Silvas

## 2022-08-08 ENCOUNTER — Telehealth: Payer: Self-pay

## 2022-08-08 ENCOUNTER — Ambulatory Visit (INDEPENDENT_AMBULATORY_CARE_PROVIDER_SITE_OTHER): Payer: Medicaid Other | Admitting: Physician Assistant

## 2022-08-08 DIAGNOSIS — M7542 Impingement syndrome of left shoulder: Secondary | ICD-10-CM

## 2022-08-08 DIAGNOSIS — Z9889 Other specified postprocedural states: Secondary | ICD-10-CM

## 2022-08-08 MED ORDER — HYDROCODONE-ACETAMINOPHEN 5-325 MG PO TABS: 1.0000 | ORAL_TABLET | Freq: Two times a day (BID) | ORAL | 0 refills | Status: DC | PRN

## 2022-08-08 NOTE — Telephone Encounter (Signed)
[facility] called. She went through screening questions for patient's hydrocodone preauth. She said that it can take up to 24hr before we know if medication has been approved by insurance or not.  Ref #[id number]

## 2022-08-08 NOTE — Progress Notes (Addendum)
Post-Op Visit Note   Patient: Aaron Gibson           Date of Birth: 1966/05/09           MRN: [id number] Visit Date: 08/08/2022 PCP: Esperanza Richters, PA-C   Assessment & Plan:  Chief Complaint:  Chief Complaint  Patient presents with   Left Shoulder - Routine Post Op    Left shoulder scope 08/01/22    Visit Diagnoses:  1. Impingement syndrome of left shoulder   2. S/P arthroscopy of left shoulder     Plan: Patient is a pleasant 56 year old gentleman who comes in today 1 week status post left shoulder arthroscopic debridement, decompression and open biceps tenodesis 08/01/2022.  He has been doing relatively well.  He has been in a mild to moderate amount of pain which is relieved with oxycodone.  Examination of his left shoulder reveals well-healed surgical portals with nylon sutures in place.  He does have a well-healing surgical incision over the biceps tenodesis with nylon sutures in place.  He has a fair amount of ecchymosis.  No evidence of infection or cellulitis.  Fingers warm well perfused.  He is neurovascular tact distally.  Today, portal sutures were removed and Steri-Strips applied.  We long incision was recovered.  Intraoperative pictures reviewed.  I have sent in a referral for outpatient physical therapy and have sent in Norco to take as needed.  No lifting greater than 2 pounds to the operative extremity for the next 6 weeks.  He will follow-up next week for suture removal.  Call with concerns or questions in the meantime.  Follow-Up Instructions: Return in about 1 week (around 08/15/2022).   Orders:  Orders Placed This Encounter  Procedures   Ambulatory referral to Physical Therapy   Meds ordered this encounter  Medications   HYDROcodone-acetaminophen (NORCO) 5-325 MG tablet    Sig: Take 1 tablet by mouth 2 (two) times daily as needed.    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  Past Surgical History:  Procedure Laterality Date   BUBBLE STUDY  01/02/2021   Procedure: BUBBLE STUDY;  Surgeon: Meriam Sprague, MD;  Location: [facility] ENDOSCOPY;  Service: Cardiovascular;;   SHOULDER ARTHROSCOPY WITH SUBACROMIAL DECOMPRESSION, ROTATOR CUFF REPAIR AND BICEP TENDON REPAIR Left 08/01/2022   Procedure: LEFT SHOULDER SCOPE, EXTENSIVE DEBRIDEMENT, SUBACROMIAL DECOMPRESSION, BICEPS TENODESIS.;  Surgeon: Tarry Kos, MD;  Location: [facility] OR;  Service: Orthopedics;  Laterality: Left;   TEE WITHOUT CARDIOVERSION N/A 01/02/2021   Procedure: TRANSESOPHAGEAL ECHOCARDIOGRAM (TEE);  Surgeon: Meriam Sprague,  MD;  Location: [facility] ENDOSCOPY;  Service: Cardiovascular;  Laterality: N/A;   TONSILLECTOMY     UMBILICAL HERNIA REPAIR N/A 03/15/2015   Procedure: UMBILICAL HERNIA REPAIR WITH MESH;  Surgeon: Abigail Miyamoto, MD;  Location: [facility] OR;  Service: General;  Laterality: N/A;   Social History   Occupational History   Occupation: Unemployed   Tobacco Use   Smoking status: Every Day    Packs/day: 0.50    Years: 29.00    Total pack years: 14.50    Types: Cigarettes   Smokeless tobacco: Never   Tobacco  comments:    quit 12/28/2020  Vaping Use   Vaping Use: Never used  Substance and Sexual Activity   Alcohol use: Yes    Comment: occasionally holidays   Drug use: No   Sexual activity: Yes

## 2022-08-16 ENCOUNTER — Encounter: Payer: Self-pay | Admitting: Orthopaedic Surgery

## 2022-08-16 ENCOUNTER — Ambulatory Visit (INDEPENDENT_AMBULATORY_CARE_PROVIDER_SITE_OTHER): Payer: Medicaid Other | Admitting: Orthopaedic Surgery

## 2022-08-16 DIAGNOSIS — Z9889 Other specified postprocedural states: Secondary | ICD-10-CM

## 2022-08-16 MED ORDER — OXYCODONE-ACETAMINOPHEN 5-325 MG PO TABS: 1.0000 | ORAL_TABLET | Freq: Every evening | ORAL | 0 refills | Status: DC | PRN

## 2022-08-16 NOTE — Progress Notes (Signed)
Post-Op Visit Note   Patient: Aaron Gibson           Date of Birth: 1966/10/04           MRN: [id number] Visit Date: 08/16/2022 PCP: Esperanza Richters, PA-C   Assessment & Plan:  Chief Complaint:  Chief Complaint  Patient presents with   Left Shoulder - Follow-up    Left shoulder arthroscopy 08/01/2022   Visit Diagnoses:  1. S/P arthroscopy of left shoulder     Plan: Kendon returns today 2 weeks status post left shoulder scope with biceps tenodesis.  Has not started PT yet because he has social issues.  Would like a refill on his pain medications.  Examination of left shoulder shows expected passive range of motion with some mild discomfort.  No significant guarding or limitation to range of motion.  Surgical incision is healed.  Sutures were removed.  Patient will call to make an appointment for outpatient PT.  Percocet refilled to be used mainly at nighttime.  Recheck in 4 weeks.  Sling is to be worn in public.  Limit lifting to 5 pounds with the operative extremity.  Follow-Up Instructions: Return in about 4 weeks (around 09/13/2022).   Orders:  No orders of the defined types were placed in this encounter.  Meds ordered this encounter  Medications   oxyCODONE-acetaminophen (PERCOCET) 5-325 MG tablet    Sig: Take 1-2 tablets by mouth at bedtime as needed for severe pain.    Dispense:  20 tablet    Refill:  0    Imaging: No results found.  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  Past Surgical History:  Procedure Laterality Date   BUBBLE STUDY  01/02/2021   Procedure: BUBBLE STUDY;  Surgeon: Meriam Sprague, MD;  Location: [facility] ENDOSCOPY;  Service: Cardiovascular;;   SHOULDER ARTHROSCOPY WITH SUBACROMIAL DECOMPRESSION, ROTATOR CUFF REPAIR AND BICEP TENDON REPAIR Left 08/01/2022   Procedure: LEFT SHOULDER SCOPE, EXTENSIVE DEBRIDEMENT, SUBACROMIAL DECOMPRESSION, BICEPS TENODESIS.;  Surgeon: Tarry Kos, MD;  Location: [facility] OR;  Service: Orthopedics;  Laterality: Left;   TEE WITHOUT CARDIOVERSION N/A 01/02/2021   Procedure: TRANSESOPHAGEAL ECHOCARDIOGRAM (TEE);  Surgeon: Meriam Sprague, MD;  Location: [facility] ENDOSCOPY;  Service: Cardiovascular;  Laterality: N/A;   TONSILLECTOMY     UMBILICAL HERNIA REPAIR N/A 03/15/2015   Procedure: UMBILICAL HERNIA REPAIR WITH MESH;  Surgeon: Abigail Miyamoto, MD;  Location: [facility] OR;  Service: General;  Laterality: N/A;   Social History   Occupational History   Occupation: Unemployed   Tobacco Use   Smoking status: Every Day    Packs/day: 0.50    Years: 29.00    Total  pack years: 14.50    Types: Cigarettes   Smokeless tobacco: Never   Tobacco comments:    quit 12/28/2020  Vaping Use   Vaping Use: Never used  Substance and Sexual Activity   Alcohol use: Yes    Comment: occasionally holidays   Drug use: No   Sexual activity: Yes

## 2022-08-23 ENCOUNTER — Telehealth: Payer: Self-pay | Admitting: Orthopaedic Surgery

## 2022-08-23 ENCOUNTER — Other Ambulatory Visit: Payer: Self-pay | Admitting: Physician Assistant

## 2022-08-23 NOTE — Telephone Encounter (Signed)
Norco rx was written a few weeks ago.  Xu sent in percocet last week so no to percocet

## 2022-08-23 NOTE — Telephone Encounter (Signed)
Called and notified Rachel.

## 2022-08-23 NOTE — Telephone Encounter (Signed)
[employer] [employer]ras (Pharmacists) called requesting a call back for medication clarification. She states pt just picked up 15 day supply of Norco and we sent in percocets and wanted to make sure it's ok to fill and release to pt. Please call Fleet Contras back at [phone].

## 2022-09-03 ENCOUNTER — Telehealth: Payer: Self-pay | Admitting: Orthopaedic Surgery

## 2022-09-03 NOTE — Telephone Encounter (Signed)
Pt called requesting to contact pharmacy with pre auth for oxycodone. Please call pt at when sent in at [phone].

## 2022-09-04 ENCOUNTER — Other Ambulatory Visit: Payer: Self-pay | Admitting: Physician Assistant

## 2022-09-04 ENCOUNTER — Encounter: Payer: Self-pay | Admitting: Orthopaedic Surgery

## 2022-09-04 MED ORDER — HYDROCODONE-ACETAMINOPHEN 5-325 MG PO TABS: 1.0000 | ORAL_TABLET | Freq: Two times a day (BID) | ORAL | 0 refills | Status: DC | PRN

## 2022-09-04 NOTE — Telephone Encounter (Signed)
Need to wean to norco and I just sent in

## 2022-09-04 NOTE — Telephone Encounter (Signed)
Spoke with patient. He was frustrated because he felt like nobody was listening to him. He said that the hydrocodone does not help with his pain. He was prescribed oxycodone 3 weeks ago, but never got it due to preauth. He is now over a month out from surgery and wants more oxycodone instead of the hydrocodone. I explained that Dr.Xu cannot continue to always prescribe the oxycodone, this far out from surgery it is time to wean down and take something like hydrocodone. It was written for 1 tablet twice daily. Patient understands. I also told him that I spoke with his pharmacy and it is covered by his insurance.

## 2022-09-18 NOTE — Therapy (Signed)
OUTPATIENT PHYSICAL THERAPY SHOULDER EVALUATION   Patient Name: Aaron Gibson MRN: [id number] DOB:August 16, 1966, 56 y.o., male Today's Date: 09/19/2022   PT End of Session - 09/19/22 1109     Visit Number 1    Authorization Type [employer]    Authorization Time Period 09/19/22 to 11/28/22    PT Start Time 1102    PT Stop Time 1145    PT Time Calculation (min) 43 min             Past Medical History:  Diagnosis Date   Hypertension    untreated   Morbid obesity ([place])    Pre-diabetes    per patient report   Stroke [facility])    2021- left sided weakness per patient report   Past Surgical History:  Procedure Laterality Date   BUBBLE STUDY  01/02/2021   Procedure: BUBBLE STUDY;  Surgeon: Freada Bergeron, MD;  Location: [facility] ENDOSCOPY;  Service: Cardiovascular;;   SHOULDER ARTHROSCOPY WITH SUBACROMIAL DECOMPRESSION, ROTATOR CUFF REPAIR AND BICEP TENDON REPAIR Left 08/01/2022   Procedure: LEFT SHOULDER SCOPE, EXTENSIVE DEBRIDEMENT, SUBACROMIAL DECOMPRESSION, BICEPS TENODESIS.;  Surgeon: Leandrew Koyanagi, MD;  Location: [place];  Service: Orthopedics;  Laterality: Left;   TEE WITHOUT CARDIOVERSION N/A 01/02/2021   Procedure: TRANSESOPHAGEAL ECHOCARDIOGRAM (TEE);  Surgeon: Freada Bergeron, MD;  Location: [facility] ENDOSCOPY;  Service: Cardiovascular;  Laterality: N/A;   TONSILLECTOMY     UMBILICAL HERNIA REPAIR N/A 03/15/2015   Procedure: UMBILICAL HERNIA REPAIR WITH MESH;  Surgeon: Coralie Keens, MD;  Location: [place];  Service: General;  Laterality: N/A;   Patient Active Problem List   Diagnosis Date Noted   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 08/01/2022   Impingement syndrome of left shoulder 07/12/2022   Partial nontraumatic tear of left rotator cuff 07/12/2022   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   Cerebrovascular accident (CVA) of right basal ganglia ([place]) 01/02/2021   CVA (cerebral vascular accident) ([place]) 12/29/2020    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 He had PT which helped some, but about a month later his shoulder became stiff and popping and clicking. He underwent Lt shoulder scope on 08/01/22 and feels that the pain/clicking is worse than before. He is unable to lift his arm overhead.   PERTINENT HISTORY: Lt shoulder arthroscopy 07/29/22 Stroke 2021 Lt sided weakness  PAIN:  Are you having pain? Yes: NPRS scale: 0/10 Pain location: Lt anterior shoulder  Pain description: throbbing, sharp pain when moving the arm Aggravating factors: sleeping on the Lt side, trying to move it overhead Relieving factors: keeping the arm in close  PRECAUTIONS: Shoulder no lifting more than 2# for 1st 6 weeks   WEIGHT BEARING RESTRICTIONS No  FALLS:  Has patient fallen in last 6 months? No  LIVING ENVIRONMENT: Lives with: lives with their family Lives in: House/apartment   OCCUPATION: No work, 2 boys 8 and 10 yr, daughter just graduated  PLOF: Independent  PATIENT GOALS  decrease pain and be able to use the Lt arm  OBJECTIVE:   DIAGNOSTIC FINDINGS:    PATIENT SURVEYS:  Quick Dash 88.6%  COGNITION:  Overall cognitive status: Within functional  limits for tasks assessed     SENSATION: WFL  POSTURE: Lt shoulder forward, rounded, holding the arm on his abdomen  UPPER EXTREMITY ROM:   Passive ROM Right eval Left eval  Shoulder flexion  100 heavy guarding (+) pain  Shoulder extension    Shoulder abduction  90 (+) pain  Shoulder adduction    Shoulder internal rotation  20  Shoulder external rotation  20/30   Elbow flexion    Elbow extension    Wrist flexion    Wrist extension    Wrist ulnar deviation    Wrist radial deviation    Wrist pronation    Wrist supination    (Blank rows = not tested)  UPPER EXTREMITY MMT:  MMT Right eval Left eval  Shoulder flexion  1  Shoulder extension    Shoulder abduction  1  Shoulder adduction    Shoulder internal rotation    Shoulder external rotation    Middle trapezius    Lower trapezius    Elbow flexion  5  Elbow extension  5  Wrist flexion  5  Wrist extension  5  Wrist ulnar deviation    Wrist radial deviation    Wrist pronation    Wrist supination    Grip strength (lbs) 91.6 77  (Blank rows = not tested)  SHOULDER SPECIAL TESTS:  Not tested   JOINT MOBILITY TESTING:  Hypomobile posterior shoulder glide  PALPATION:  Tenderness Lt anterior shoulder, muscle spasm Lt posterior shoulder infraspinatus   TODAY'S TREATMENT:   09/19/22: Seated AAROM Lt shoulder flexion with stick on ground-PT educated on importance of actively trying to flex the shoulder during the assisted movement Sleep positions with pillows for improved Lt shoulder pain    PATIENT EDUCATION: Education details: sleep posture; eval findings/POC Person educated: Patient Education method: Programmer, multimedia, Demonstration, and Verbal cues Education comprehension: verbalized understanding, returned demonstration, and verbal cues required   HOME EXERCISE PROGRAM: Next visit  ASSESSMENT:  CLINICAL IMPRESSION: Patient is a 56 y.o. M who was seen today for physical therapy evaluation and treatment  s/p Lt shoulder arthroscopy and biceps tenodesis on 07/29/22. He continues to have pain, stiffness and popping of the Lt shoulder. Pt heavily guards the Lt shoulder during today's evaluation and  has poor muscle activation with attempts at shoulder flexion and abduction. Pt has been working some on his HEP from when he was coming to PT prior to surgery, but he required some education on the importance of trying to actively use the shoulder with this.   OBJECTIVE IMPAIRMENTS decreased activity tolerance, decreased coordination, decreased ROM, decreased strength, hypomobility, increased fascial restrictions, increased muscle spasms, impaired flexibility, impaired UE functional use, improper body mechanics, postural dysfunction, and pain.   ACTIVITY LIMITATIONS carrying, lifting, sleeping, bed mobility, bathing, dressing, reach over head, and caring for others  PARTICIPATION LIMITATIONS: cleaning, laundry, interpersonal relationship, community activity, occupation, and yard work  PERSONAL FACTORS Age, Past/current experiences, and 1-2 comorbidities: Lt shoulder surgery, stroke 2021 with Lt sided weakness  are also affecting patient's functional outcome.   REHAB POTENTIAL: Good  CLINICAL DECISION MAKING: Evolving/moderate complexity  EVALUATION COMPLEXITY: Moderate   GOALS: Goals reviewed with patient? Yes  SHORT TERM GOALS: Target date: 10/24/2022  (Remove Blue [employer])  Pt will be independent with HEP to increase Lt shoulder ROM and decrease pain. Goal status: INITIAL   LONG TERM GOALS: Target date: 11/28/2022  (Remove Blue Hyperlink)  Pt will have Lt shoulder strength of atleast 3/5 MMT. Baseline:  Goal status: INITIAL  2.  Pt will have improved Lt shoulder flexion A/ROM to atleast 120deg for reaching overhead. Baseline:  Goal status: INITIAL  3.  Pt will have improved Lt grip strength to within 10lb of the Rt. Baseline:  Goal status: INITIAL  4.  Pt will report atleast 50%  improvement in his Lt shoulder pain with daily activity.  Baseline:  Goal status: INITIAL  5.  Pt's QuickDash score will improve to greater than 40% from the start of PT. Baseline: 88% Goal status: INITIAL     PLAN: PT FREQUENCY: 2x/week  PT DURATION: 10 weeks  PLANNED INTERVENTIONS: Therapeutic exercises, Therapeutic activity, Neuromuscular re-education, Patient/Family education, Self Care, Joint mobilization, Dry Needling, Manual therapy, and Re-evaluation  PLAN FOR NEXT SESSION: establish DJM:EQAST, scap control   12:15 PM,09/19/22 Donita Brooks PT, DPT [facility] [place]  [phone]

## 2022-09-19 ENCOUNTER — Encounter: Payer: Self-pay | Admitting: Physical Therapy

## 2022-09-19 ENCOUNTER — Other Ambulatory Visit: Payer: Self-pay

## 2022-09-19 ENCOUNTER — Ambulatory Visit: Payer: Medicaid Other | Attending: Physician Assistant | Admitting: Physical Therapy

## 2022-09-19 DIAGNOSIS — Z9889 Other specified postprocedural states: Secondary | ICD-10-CM | POA: Diagnosis not present

## 2022-09-19 DIAGNOSIS — M25512 Pain in left shoulder: Secondary | ICD-10-CM | POA: Diagnosis not present

## 2022-09-19 DIAGNOSIS — M25612 Stiffness of left shoulder, not elsewhere classified: Secondary | ICD-10-CM | POA: Insufficient documentation

## 2022-09-19 DIAGNOSIS — M6281 Muscle weakness (generalized): Secondary | ICD-10-CM | POA: Insufficient documentation

## 2022-10-02 ENCOUNTER — Other Ambulatory Visit: Payer: Self-pay | Admitting: Medical

## 2022-10-02 MED ORDER — CYCLOBENZAPRINE HCL 5 MG PO TABS: ORAL_TABLET | ORAL | 0 refills | Status: DC

## 2022-10-10 ENCOUNTER — Ambulatory Visit: Payer: Medicaid Other | Admitting: Physical Therapy

## 2022-10-10 ENCOUNTER — Ambulatory Visit: Payer: Medicaid Other | Attending: Physician Assistant | Admitting: Physical Therapy

## 2022-10-10 DIAGNOSIS — M25612 Stiffness of left shoulder, not elsewhere classified: Secondary | ICD-10-CM | POA: Insufficient documentation

## 2022-10-10 DIAGNOSIS — M6281 Muscle weakness (generalized): Secondary | ICD-10-CM | POA: Insufficient documentation

## 2022-10-10 DIAGNOSIS — G8929 Other chronic pain: Secondary | ICD-10-CM | POA: Insufficient documentation

## 2022-10-10 DIAGNOSIS — R600 Localized edema: Secondary | ICD-10-CM | POA: Insufficient documentation

## 2022-10-10 DIAGNOSIS — M25512 Pain in left shoulder: Secondary | ICD-10-CM | POA: Insufficient documentation

## 2022-10-12 ENCOUNTER — Ambulatory Visit: Payer: Medicaid Other | Admitting: Physical Therapy

## 2022-10-12 ENCOUNTER — Encounter: Payer: Self-pay | Admitting: Physical Therapy

## 2022-10-12 DIAGNOSIS — R600 Localized edema: Secondary | ICD-10-CM | POA: Diagnosis not present

## 2022-10-12 DIAGNOSIS — M6281 Muscle weakness (generalized): Secondary | ICD-10-CM

## 2022-10-12 DIAGNOSIS — M25512 Pain in left shoulder: Secondary | ICD-10-CM

## 2022-10-12 DIAGNOSIS — M25612 Stiffness of left shoulder, not elsewhere classified: Secondary | ICD-10-CM | POA: Diagnosis not present

## 2022-10-12 DIAGNOSIS — G8929 Other chronic pain: Secondary | ICD-10-CM | POA: Diagnosis not present

## 2022-10-12 NOTE — Therapy (Signed)
OUTPATIENT PHYSICAL THERAPY SHOULDER TREATMENT   Patient Name: Aaron Gibson MRN: [id number] DOB:[birth year], 56 y.o., male Today's Date: 10/12/2022   PT End of Session - 10/12/22 1055     Visit Number 2    Authorization Type [employer]    Authorization Time Period 09/19/22 to 11/28/22    PT Start Time 1055    PT Stop Time 1140    PT Time Calculation (min) 45 min    Activity Tolerance Patient tolerated treatment well    Behavior During Therapy [facility] for tasks assessed/performed             Past Medical History:  Diagnosis Date   Hypertension    untreated   Morbid obesity ([place])    Pre-diabetes    per patient report   Stroke [facility])    2021- left sided weakness per patient report   Past Surgical History:  Procedure Laterality Date   BUBBLE STUDY  01/02/2021   Procedure: BUBBLE STUDY;  Surgeon: Freada Bergeron, MD;  Location: [facility] ENDOSCOPY;  Service: Cardiovascular;;   SHOULDER ARTHROSCOPY WITH SUBACROMIAL DECOMPRESSION, ROTATOR CUFF REPAIR AND BICEP TENDON REPAIR Left 08/01/2022   Procedure: LEFT SHOULDER SCOPE, EXTENSIVE DEBRIDEMENT, SUBACROMIAL DECOMPRESSION, BICEPS TENODESIS.;  Surgeon: Leandrew Koyanagi, MD;  Location: [place];  Service: Orthopedics;  Laterality: Left;   TEE WITHOUT CARDIOVERSION N/A 01/02/2021   Procedure: TRANSESOPHAGEAL ECHOCARDIOGRAM (TEE);  Surgeon: Freada Bergeron, MD;  Location: [facility] ENDOSCOPY;  Service: Cardiovascular;  Laterality: N/A;   TONSILLECTOMY     UMBILICAL HERNIA REPAIR N/A 03/15/2015   Procedure: UMBILICAL HERNIA REPAIR WITH MESH;  Surgeon: Coralie Keens, MD;  Location: [place];  Service: General;  Laterality: N/A;   Patient Active Problem List   Diagnosis Date Noted   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 08/01/2022   Impingement syndrome of left shoulder 07/12/2022   Partial nontraumatic tear of left rotator cuff 07/12/2022   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    Cerebrovascular accident (CVA) of right basal ganglia ([place]) 01/02/2021   CVA (cerebral vascular accident) ([place]) 12/29/2020   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  12/28/2020   Chest pain, musculoskeletal 12/28/2020   Morbid obesity ([place])    Hypertension     PCP: Mackie Pai, PA-C  REFERRING PROVIDER: Dwana Melena, PA-C  REFERRING DIAG: s/p arthroscopy Lt shoulder  THERAPY DIAG:  Stiffness of left shoulder, not elsewhere classified  Left shoulder pain, unspecified chronicity  Muscle weakness (generalized)  Localized edema  Rationale for Evaluation and Treatment Rehabilitation  ONSET DATE: 08/01/22  SUBJECTIVE:  SUBJECTIVE STATEMENT: Pain is gone, I just cant move it  PERTINENT HISTORY: Lt shoulder arthroscopy 07/29/22 Stroke 2021 Lt sided weakness  PAIN:  Are you having pain? Yes: NPRS scale: 0/10 Pain location: Lt anterior shoulder  Pain description: throbbing, sharp pain when moving the arm Aggravating factors: sleeping on the Lt side, trying to move it overhead Relieving factors: keeping the arm in close  PRECAUTIONS: Shoulder no lifting more than 2# for 1st 6 weeks   WEIGHT BEARING RESTRICTIONS No  FALLS:  Has patient fallen in last 6 months? No  LIVING ENVIRONMENT: Lives with: lives with their family Lives in: House/apartment   OCCUPATION: No work, 2 boys 8 and 10 yr, daughter just graduated  PLOF: Independent  PATIENT GOALS  decrease pain and be able to use the Lt arm  OBJECTIVE:   DIAGNOSTIC FINDINGS:    PATIENT SURVEYS:  Quick Dash 88.6%  COGNITION:  Overall cognitive status: Within functional limits for tasks assessed     SENSATION: WFL  POSTURE: Lt shoulder forward, rounded, holding the arm on his  abdomen  UPPER EXTREMITY ROM:   Passive ROM Right eval Left eval  Shoulder flexion  100 heavy guarding (+) pain  Shoulder extension    Shoulder abduction  90 (+) pain  Shoulder adduction    Shoulder internal rotation  20  Shoulder external rotation  20/30   Elbow flexion    Elbow extension    Wrist flexion    Wrist extension    Wrist ulnar deviation    Wrist radial deviation    Wrist pronation    Wrist supination    (Blank rows = not tested)  UPPER EXTREMITY MMT:  MMT Right eval Left eval  Shoulder flexion  1  Shoulder extension    Shoulder abduction  1  Shoulder adduction    Shoulder internal rotation    Shoulder external rotation    Middle trapezius    Lower trapezius    Elbow flexion  5  Elbow extension  5  Wrist flexion  5  Wrist extension  5  Wrist ulnar deviation    Wrist radial deviation    Wrist pronation    Wrist supination    Grip strength (lbs) 91.6 77  (Blank rows = not tested)  SHOULDER SPECIAL TESTS:  Not tested   JOINT MOBILITY TESTING:  Hypomobile posterior shoulder glide  PALPATION:  Tenderness Lt anterior shoulder, muscle spasm Lt posterior shoulder infraspinatus   TODAY'S TREATMENT:  10/12/22 UBE L1 x 3 min each Standing AAROM shoulder Flex, Ext, IR up back  Rows red 2x10 Ext Red 2x10 LUE IR 2x10 yellow  LUE ER yellow 2x10 very limited ROM & weakness Supine LUE flexion x5 with some assist eccentric lowering LUR PROM in all  directions Rhythmic stabilization  09/19/22:  Seated AAROM Lt shoulder flexion with stick on ground-PT educated on importance of actively trying to flex the shoulder during the assisted movement  Sleep positions with pillows for improved Lt shoulder pain    PATIENT EDUCATION: Education details: sleep posture; eval findings/POC Person educated: Patient Education method: Programmer, multimedia, Demonstration, and Verbal cues Education comprehension: verbalized understanding, returned demonstration, and verbal cues  required   HOME EXERCISE PROGRAM: Next visit  ASSESSMENT:  CLINICAL IMPRESSION: Patient is a 56 y.o. M who was seen today for physical therapy treatment s/p Lt shoulder arthroscopy and biceps tenodesis on 07/29/22. He reports no pain but continued, stiffness and popping of the Lt shoulder. Little muscular activation with flexion and  abduction. Assist needed to complete supine flexion. L shoulder very tight with Passive flexion and abduction.   OBJECTIVE IMPAIRMENTS decreased activity tolerance, decreased coordination, decreased ROM, decreased strength, hypomobility, increased fascial restrictions, increased muscle spasms, impaired flexibility, impaired UE functional use, improper body mechanics, postural dysfunction, and pain.   ACTIVITY LIMITATIONS carrying, lifting, sleeping, bed mobility, bathing, dressing, reach over head, and caring for others  PARTICIPATION LIMITATIONS: cleaning, laundry, interpersonal relationship, community activity, occupation, and yard work  PERSONAL FACTORS Age, Past/current experiences, and 1-2 comorbidities: Lt shoulder surgery, stroke 2021 with Lt sided weakness  are also affecting patient's functional outcome.   REHAB POTENTIAL: Good  CLINICAL DECISION MAKING: Evolving/moderate complexity  EVALUATION COMPLEXITY: Moderate   GOALS: Goals reviewed with patient? Yes  SHORT TERM GOALS: Target date: 11/16/2022  (Remove Blue Hyperlink)  Pt will be independent with HEP to increase Lt shoulder ROM and decrease pain. Goal status: INITIAL   LONG TERM GOALS: Target date: 12/21/2022  (Remove Blue Hyperlink)  Pt will have Lt shoulder strength of atleast 3/5 MMT. Baseline:  Goal status: INITIAL  2.  Pt will have improved Lt shoulder flexion A/ROM to atleast 120deg for reaching overhead. Baseline:  Goal status: INITIAL  3.  Pt will have improved Lt grip strength to within 10lb of the Rt. Baseline:  Goal status: INITIAL  4.  Pt will report atleast 50%  improvement in his Lt shoulder pain with daily activity.  Baseline:  Goal status: INITIAL  5.  Pt's QuickDash score will improve to greater than 40% from the start of PT. Baseline: 88% Goal status: INITIAL     PLAN: PT FREQUENCY: 2x/week  PT DURATION: 10 weeks  PLANNED INTERVENTIONS: Therapeutic exercises, Therapeutic activity, Neuromuscular re-education, Patient/Family education, Self Care, Joint mobilization, Dry Needling, Manual therapy, and Re-evaluation  PLAN FOR NEXT SESSION: establish [id number], scap control   10:55 AM,10/12/22 Donita Brooks PT, DPT [facility] [place]  [phone]

## 2022-10-15 NOTE — Therapy (Signed)
OUTPATIENT PHYSICAL THERAPY SHOULDER TREATMENT   Patient Name: Aaron Gibson MRN: [id number] DOB:[birth year], 56 y.o., male Today's Date: 10/16/2022   PT End of Session - 10/16/22 1102     Visit Number 3    Authorization Type [employer]    Authorization Time Period 09/19/22 to 11/28/22    PT Start Time 1100    PT Stop Time 1145    PT Time Calculation (min) 45 min    Activity Tolerance Patient tolerated treatment well    Behavior During Therapy [facility] for tasks assessed/performed              Past Medical History:  Diagnosis Date   Hypertension    untreated   Morbid obesity ([facility])    Pre-diabetes    per patient report   Stroke [facility])    2021- left sided weakness per patient report   Past Surgical History:  Procedure Laterality Date   BUBBLE STUDY  01/02/2021   Procedure: BUBBLE STUDY;  Surgeon: Meriam Sprague, MD;  Location: [facility] ENDOSCOPY;  Service: Cardiovascular;;   SHOULDER ARTHROSCOPY WITH SUBACROMIAL DECOMPRESSION, ROTATOR CUFF REPAIR AND BICEP TENDON REPAIR Left 08/01/2022   Procedure: LEFT SHOULDER SCOPE, EXTENSIVE DEBRIDEMENT, SUBACROMIAL DECOMPRESSION, BICEPS TENODESIS.;  Surgeon: Tarry Kos, MD;  Location: [facility] OR;  Service: Orthopedics;  Laterality: Left;   TEE WITHOUT CARDIOVERSION N/A 01/02/2021   Procedure: TRANSESOPHAGEAL ECHOCARDIOGRAM (TEE);  Surgeon: Meriam Sprague, MD;  Location: [facility] ENDOSCOPY;  Service: Cardiovascular;  Laterality: N/A;   TONSILLECTOMY     UMBILICAL HERNIA REPAIR N/A 03/15/2015   Procedure: UMBILICAL HERNIA REPAIR WITH MESH;  Surgeon: Abigail Miyamoto, MD;  Location: [facility] OR;  Service: General;  Laterality: N/A;   Patient Active Problem List   Diagnosis Date Noted   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 08/01/2022   Impingement syndrome of left shoulder 07/12/2022   Partial nontraumatic tear of left rotator cuff 07/12/2022   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    Cerebrovascular accident (CVA) of right basal ganglia ([facility]) 01/02/2021   CVA (cerebral vascular accident) ([facility]) 12/29/2020   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   PERTINENT HISTORY: Lt shoulder arthroscopy 07/29/22 Stroke 2021 Lt sided weakness  PAIN:  Are you having pain? Yes: NPRS scale: 0/10 Pain location: Lt anterior shoulder  Pain description: throbbing, sharp pain when moving the arm Aggravating factors: sleeping on the Lt side, trying to move it overhead Relieving factors: keeping the arm in close  PRECAUTIONS: Shoulder no lifting more than 2# for 1st 6 weeks   WEIGHT BEARING RESTRICTIONS No  FALLS:  Has patient fallen in last 6 months? No  LIVING ENVIRONMENT: Lives with: lives with their family Lives in: House/apartment   OCCUPATION: No work, 2 boys 29 and 41 yr, daughter just graduated  PLOF: Independent  PATIENT GOALS  decrease pain and be able to use the Lt arm  OBJECTIVE:   DIAGNOSTIC FINDINGS:    PATIENT SURVEYS:  Quick Dash 88.6%  COGNITION:  Overall cognitive status: Within functional limits for tasks  assessed     SENSATION: WFL  POSTURE: Lt shoulder forward, rounded, holding the arm on his abdomen  UPPER EXTREMITY ROM:   Passive ROM Right eval Left eval  Shoulder flexion  100 heavy guarding (+) pain  Shoulder extension    Shoulder abduction  90 (+) pain  Shoulder adduction    Shoulder internal rotation  20  Shoulder external rotation  20/30   Elbow flexion    Elbow extension    Wrist flexion    Wrist extension    Wrist ulnar deviation    Wrist radial deviation    Wrist pronation    Wrist supination    (Blank rows = not tested)  UPPER EXTREMITY MMT:  MMT Right eval Left eval  Shoulder flexion  1  Shoulder extension    Shoulder abduction  1  Shoulder adduction    Shoulder internal rotation    Shoulder external rotation    Middle trapezius    Lower trapezius    Elbow flexion  5  Elbow extension  5  Wrist flexion  5  Wrist extension  5  Wrist ulnar deviation    Wrist radial deviation    Wrist pronation    Wrist supination    Grip strength (lbs) 91.6 77  (Blank rows = not tested)  SHOULDER SPECIAL TESTS:  Not tested   JOINT MOBILITY TESTING:  Hypomobile posterior shoulder glide  PALPATION:  Tenderness Lt anterior shoulder, muscle spasm Lt posterior shoulder infraspinatus   TODAY'S TREATMENT:  10/16/22 UBE L2 x74mins  PROM all directions  Supine flexion 1# x10 Chest press 1# x10 ER/IR with 2# supine x10 Rows and Ext red x10 YellowTB ER with scap retraction 2x10 Ext with dowel x10  10/12/22 UBE L1 x 3 min each Standing AAROM shoulder Flex, Ext, IR up back  Rows red 2x10 Ext Red 2x10 LUE IR 2x10 yellow  LUE ER yellow 2x10 very limited ROM & weakness Supine LUE flexion x5 with some assist eccentric lowering LUR PROM in all  directions Rhythmic stabilization  09/19/22:  Seated AAROM Lt shoulder flexion with stick on ground-PT educated on importance of actively trying to flex the shoulder during the assisted movement  Sleep positions with  pillows for improved Lt shoulder pain    PATIENT EDUCATION: Education details: sleep posture; eval findings/POC Person educated: Patient Education method: Explanation, Demonstration, and Verbal cues Education comprehension: verbalized understanding, returned demonstration, and verbal cues required  HOME EXERCISE PROGRAM: Next visit  ASSESSMENT:  CLINICAL IMPRESSION: Patient reports no pain but continued, stiffness and popping of the Lt shoulder. L shoulder very tight and increased muscle guarding with passive range of motion. Most limited with active shoulder flexion. Slow with exercises today due to pain and catching in L shoulder.   OBJECTIVE IMPAIRMENTS decreased activity tolerance, decreased coordination, decreased ROM, decreased strength, hypomobility, increased fascial restrictions, increased muscle spasms, impaired flexibility, impaired UE functional use, improper body mechanics, postural dysfunction, and pain.   ACTIVITY LIMITATIONS carrying, lifting, sleeping, bed mobility, bathing, dressing, reach over head, and caring for others  PARTICIPATION LIMITATIONS: cleaning, laundry, interpersonal relationship, community activity, occupation, and yard work  PERSONAL FACTORS Age, Past/current experiences, and 1-2 comorbidities: Lt shoulder surgery, stroke 2021 with Lt sided weakness  are also affecting patient's functional outcome.   REHAB POTENTIAL: Good  CLINICAL DECISION MAKING: Evolving/moderate complexity  EVALUATION COMPLEXITY: Moderate   GOALS: Goals reviewed with patient? Yes  SHORT TERM GOALS: Target date: 11/20/2022  (Remove Blue Hyperlink)  Pt will be independent with HEP to increase Lt shoulder ROM and decrease pain. Goal status: INITIAL   LONG TERM GOALS: Target date: 12/25/2022  (Remove Blue Hyperlink)  Pt will have Lt shoulder strength of atleast 3/5 MMT. Baseline:  Goal status: INITIAL  2.  Pt will have improved Lt shoulder flexion A/ROM to atleast  120deg for reaching overhead. Baseline:  Goal status: INITIAL  3.  Pt will have improved Lt grip strength to within 10lb of the Rt. Baseline:  Goal status: INITIAL  4.  Pt will report atleast 50% improvement in his Lt shoulder pain with daily activity.  Baseline:  Goal status: INITIAL  5.  Pt's QuickDash score will improve to greater than 40% from the start of PT. Baseline: 88% Goal status: INITIAL     PLAN: PT FREQUENCY: 2x/week  PT DURATION: 10 weeks  PLANNED INTERVENTIONS: Therapeutic exercises, Therapeutic activity, Neuromuscular re-education, Patient/Family education, Self Care, Joint mobilization, Dry Needling, Manual therapy, and Re-evaluation  PLAN FOR NEXT SESSION: establish [id number], scap control   11:45 AM,10/16/22 Donita Brooks PT, DPT [facility] [place]  [phone]

## 2022-10-16 ENCOUNTER — Ambulatory Visit: Payer: Medicaid Other

## 2022-10-16 DIAGNOSIS — M25512 Pain in left shoulder: Secondary | ICD-10-CM

## 2022-10-16 DIAGNOSIS — R600 Localized edema: Secondary | ICD-10-CM | POA: Diagnosis not present

## 2022-10-16 DIAGNOSIS — M25612 Stiffness of left shoulder, not elsewhere classified: Secondary | ICD-10-CM | POA: Diagnosis not present

## 2022-10-16 DIAGNOSIS — G8929 Other chronic pain: Secondary | ICD-10-CM

## 2022-10-16 DIAGNOSIS — M6281 Muscle weakness (generalized): Secondary | ICD-10-CM

## 2022-10-18 ENCOUNTER — Ambulatory Visit: Payer: Medicaid Other

## 2022-10-18 DIAGNOSIS — G8929 Other chronic pain: Secondary | ICD-10-CM | POA: Diagnosis not present

## 2022-10-18 DIAGNOSIS — M25512 Pain in left shoulder: Secondary | ICD-10-CM

## 2022-10-18 DIAGNOSIS — M6281 Muscle weakness (generalized): Secondary | ICD-10-CM

## 2022-10-18 DIAGNOSIS — M25612 Stiffness of left shoulder, not elsewhere classified: Secondary | ICD-10-CM

## 2022-10-18 DIAGNOSIS — R600 Localized edema: Secondary | ICD-10-CM | POA: Diagnosis not present

## 2022-10-18 NOTE — Therapy (Signed)
OUTPATIENT PHYSICAL THERAPY SHOULDER TREATMENT   Patient Name: Aaron Gibson MRN: [id number] DOB:1966/09/28, 56 y.o., male Today's Date: 10/18/2022   PT End of Session - 10/18/22 1057     Visit Number 4    Authorization Type [employer]    Authorization Time Period 09/19/22 to 11/28/22    PT Start Time 1058    PT Stop Time 1145    PT Time Calculation (min) 47 min    Activity Tolerance Patient tolerated treatment well    Behavior During Therapy [facility] for tasks assessed/performed               Past Medical History:  Diagnosis Date   Hypertension    untreated   Morbid obesity ([place])    Pre-diabetes    per patient report   Stroke [facility])    2021- left sided weakness per patient report   Past Surgical History:  Procedure Laterality Date   BUBBLE STUDY  01/02/2021   Procedure: BUBBLE STUDY;  Surgeon: Freada Bergeron, MD;  Location: [facility] ENDOSCOPY;  Service: Cardiovascular;;   SHOULDER ARTHROSCOPY WITH SUBACROMIAL DECOMPRESSION, ROTATOR CUFF REPAIR AND BICEP TENDON REPAIR Left 08/01/2022   Procedure: LEFT SHOULDER SCOPE, EXTENSIVE DEBRIDEMENT, SUBACROMIAL DECOMPRESSION, BICEPS TENODESIS.;  Surgeon: Leandrew Koyanagi, MD;  Location: [place];  Service: Orthopedics;  Laterality: Left;   TEE WITHOUT CARDIOVERSION N/A 01/02/2021   Procedure: TRANSESOPHAGEAL ECHOCARDIOGRAM (TEE);  Surgeon: Freada Bergeron, MD;  [facility]: [facility] ENDOSCOPY;  Service: Cardiovascular;  Laterality: N/A;   TONSILLECTOMY     UMBILICAL HERNIA REPAIR N/A 03/15/2015   Procedure: UMBILICAL HERNIA REPAIR WITH MESH;  Surgeon: Coralie Keens, MD;  Location: [place];  Service: General;  Laterality: N/A;   Patient Active Problem List   Diagnosis Date Noted   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 08/01/2022   Impingement syndrome of left shoulder 07/12/2022   Partial nontraumatic tear of left rotator cuff 07/12/2022   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    Cerebrovascular accident (CVA) of right basal ganglia ([place]) 01/02/2021   CVA (cerebral vascular accident) ([place]) 12/29/2020   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 Took tylenol before I came in today.  PERTINENT HISTORY: Lt shoulder arthroscopy 07/29/22 Stroke 2021 Lt sided weakness  PAIN:  Are you having pain? Yes: NPRS scale: 4/10 Pain location: Lt anterior shoulder  Pain description: throbbing, sharp pain when moving the arm Aggravating factors: sleeping on the Lt side, trying to move it overhead Relieving factors: keeping the arm in close  PRECAUTIONS: Shoulder no lifting more than 2# for 1st 6 weeks   WEIGHT BEARING RESTRICTIONS No  FALLS:  Has patient fallen in last 6 months? No  LIVING ENVIRONMENT: Lives with: lives with their family Lives in: House/apartment   OCCUPATION: No work, 2 boys 42 and 40 yr, daughter just graduated  PLOF: Independent  PATIENT GOALS  decrease pain and be able to use the Lt arm  OBJECTIVE:   DIAGNOSTIC FINDINGS:    PATIENT SURVEYS:  Quick Dash 88.6%  COGNITION:  Overall cognitive status: Within functional limits for tasks  assessed     SENSATION: WFL  POSTURE: Lt shoulder forward, rounded, holding the arm on his abdomen  UPPER EXTREMITY ROM:   Passive ROM Right eval Left eval  Shoulder flexion  100 heavy guarding (+) pain  Shoulder extension    Shoulder abduction  90 (+) pain  Shoulder adduction    Shoulder internal rotation  20  Shoulder external rotation  20/30   Elbow flexion    Elbow extension    Wrist flexion    Wrist extension    Wrist ulnar deviation    Wrist radial deviation    Wrist pronation    Wrist supination    (Blank rows = not tested)  UPPER EXTREMITY MMT:  MMT Right eval Left eval  Shoulder flexion  1  Shoulder extension    Shoulder abduction  1  Shoulder adduction    Shoulder internal rotation    Shoulder external rotation    Middle trapezius    Lower trapezius    Elbow flexion  5  Elbow extension  5  Wrist flexion  5  Wrist extension  5  Wrist ulnar deviation    Wrist radial deviation    Wrist pronation    Wrist supination    Grip strength (lbs) 91.6 77  (Blank rows = not tested)  SHOULDER SPECIAL TESTS:  Not tested   JOINT MOBILITY TESTING:  Hypomobile posterior shoulder glide  PALPATION:  Tenderness Lt anterior shoulder, muscle spasm Lt posterior shoulder infraspinatus   TODAY'S TREATMENT:  10/18/22 UBE L2 x2mins Ladder x5 into flexion Pec stretch 30s  Wall circles x10 clockwise and counter with RUE helping  Wall slides x10 ER/IR yellow TB 2x10 Rows and ext yellowTB 2x10 Pendulums with 5# KB  PROM in all directions  Shoulder mobilizations grade 2-3 for pain and ROM   10/16/22 UBE L2 x55mins  PROM all directions  Supine flexion 1# x10 Chest press 1# x10 ER/IR with 2# supine x10 Rows and Ext red x10 YellowTB ER with scap retraction 2x10 Ext with dowel x10  10/12/22 UBE L1 x 3 min each Standing AAROM shoulder Flex, Ext, IR up back  Rows red 2x10 Ext Red 2x10 LUE IR 2x10 yellow  LUE ER yellow 2x10 very limited ROM &  weakness Supine LUE flexion x5 with some assist eccentric lowering LUR PROM in all  directions Rhythmic stabilization  09/19/22:  Seated AAROM Lt shoulder flexion with stick on ground-PT educated  on importance of actively trying to flex the shoulder during the assisted movement  Sleep positions with pillows for improved Lt shoulder pain    PATIENT EDUCATION: Education details: sleep posture; eval findings/POC Person educated: Patient Education method: Explanation, Demonstration, and Verbal cues Education comprehension: verbalized understanding, returned demonstration, and verbal cues required   HOME EXERCISE PROGRAM: Next visit  ASSESSMENT:  CLINICAL IMPRESSION: Patient returns with increased pain and inflammation in L shoulder. Still complains of stiffness and popping of the Lt shoulder. L shoulder very tight and increased muscle guarding with passive range of motion especially into flexion and abd.    OBJECTIVE IMPAIRMENTS decreased activity tolerance, decreased coordination, decreased ROM, decreased strength, hypomobility, increased fascial restrictions, increased muscle spasms, impaired flexibility, impaired UE functional use, improper body mechanics, postural dysfunction, and pain.   ACTIVITY LIMITATIONS carrying, lifting, sleeping, bed mobility, bathing, dressing, reach over head, and caring for others  PARTICIPATION LIMITATIONS: cleaning, laundry, interpersonal relationship, community activity, occupation, and yard work  PERSONAL FACTORS Age, Past/current experiences, and 1-2 comorbidities: Lt shoulder surgery, stroke 2021 with Lt sided weakness  are also affecting patient's functional outcome.   REHAB POTENTIAL: Good  CLINICAL DECISION MAKING: Evolving/moderate complexity  EVALUATION COMPLEXITY: Moderate   GOALS: Goals reviewed with patient? Yes  SHORT TERM GOALS: Target date: 11/22/2022  (Remove Blue Hyperlink)  Pt will be independent with HEP to increase Lt  shoulder ROM and decrease pain. Goal status: INITIAL   LONG TERM GOALS: Target date: 12/27/2022  (Remove Blue Hyperlink)  Pt will have Lt shoulder strength of atleast 3/5 MMT. Baseline:  Goal status: INITIAL  2.  Pt will have improved Lt shoulder flexion A/ROM to atleast 120deg for reaching overhead. Baseline:  Goal status: INITIAL  3.  Pt will have improved Lt grip strength to within 10lb of the Rt. Baseline:  Goal status: INITIAL  4.  Pt will report atleast 50% improvement in his Lt shoulder pain with daily activity.  Baseline:  Goal status: INITIAL  5.  Pt's QuickDash score will improve to greater than 40% from the start of PT. Baseline: 88% Goal status: INITIAL     PLAN: PT FREQUENCY: 2x/week  PT DURATION: 10 weeks  PLANNED INTERVENTIONS: Therapeutic exercises, Therapeutic activity, Neuromuscular re-education, Patient/Family education, Self Care, Joint mobilization, Dry Needling, Manual therapy, and Re-evaluation  PLAN FOR NEXT SESSION: establish HUD:[id number], scap control   11:46 AM,10/18/22 Andris Baumann PT, [place] [facility] [place]

## 2022-10-23 ENCOUNTER — Ambulatory Visit: Payer: Medicaid Other | Admitting: Physical Therapy

## 2022-10-24 NOTE — Therapy (Signed)
OUTPATIENT PHYSICAL THERAPY SHOULDER TREATMENT   Patient Name: Aaron Gibson MRN: [id number] DOB:[birth year], 56 y.o., male Today's Date: 10/25/2022   PT End of Session - 10/25/22 1109     Visit Number 5    Authorization Type [employer]    Authorization Time Period 09/19/22 to 11/28/22    PT Start Time 1109    PT Stop Time 1145    PT Time Calculation (min) 36 min    Activity Tolerance Patient tolerated treatment well    Behavior During Therapy [facility] for tasks assessed/performed                Past Medical History:  Diagnosis Date   Hypertension    untreated   Morbid obesity ([place])    Pre-diabetes    per patient report   Stroke [facility])    2021- left sided weakness per patient report   Past Surgical History:  Procedure Laterality Date   BUBBLE STUDY  01/02/2021   Procedure: BUBBLE STUDY;  Surgeon: Freada Bergeron, MD;  Location: [facility] ENDOSCOPY;  Service: Cardiovascular;;   SHOULDER ARTHROSCOPY WITH SUBACROMIAL DECOMPRESSION, ROTATOR CUFF REPAIR AND BICEP TENDON REPAIR Left 08/01/2022   Procedure: LEFT SHOULDER SCOPE, EXTENSIVE DEBRIDEMENT, SUBACROMIAL DECOMPRESSION, BICEPS TENODESIS.;  Surgeon: Leandrew Koyanagi, MD;  Location: [place];  Service: Orthopedics;  Laterality: Left;   TEE WITHOUT CARDIOVERSION N/A 01/02/2021   Procedure: TRANSESOPHAGEAL ECHOCARDIOGRAM (TEE);  Surgeon: Freada Bergeron, MD;  Location: [facility] ENDOSCOPY;  Service: Cardiovascular;  Laterality: N/A;   TONSILLECTOMY     UMBILICAL HERNIA REPAIR N/A 03/15/2015   Procedure: UMBILICAL HERNIA REPAIR WITH MESH;  Surgeon: Coralie Keens, MD;  Location: [place];  Service: General;  Laterality: N/A;   Patient Active Problem List   Diagnosis Date Noted   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 08/01/2022   Impingement syndrome of left shoulder 07/12/2022   Partial nontraumatic tear of left rotator cuff 07/12/2022   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    Cerebrovascular accident (CVA) of right basal ganglia ([place]) 01/02/2021   CVA (cerebral vascular accident) ([place]) 12/29/2020   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 Motion wise it still hurts.   PERTINENT HISTORY: Lt shoulder arthroscopy 07/29/22 Stroke 2021 Lt sided weakness  PAIN:  Are you having pain? Yes: NPRS scale: 4/10 Pain location: Lt anterior shoulder  Pain description: throbbing, sharp pain when moving the arm Aggravating factors: sleeping on the Lt side, trying to move it overhead Relieving factors: keeping the arm in close  PRECAUTIONS: Shoulder no lifting more than 2# for 1st 6 weeks   WEIGHT BEARING RESTRICTIONS No  FALLS:  Has patient fallen in last 6 months? No  LIVING ENVIRONMENT: Lives with: lives with their family Lives in: House/apartment   OCCUPATION: No work, 2 boys 8 and 10 yr, daughter just graduated  PLOF: Independent  PATIENT GOALS  decrease pain and be able to use the Lt arm  OBJECTIVE:   DIAGNOSTIC FINDINGS:    PATIENT SURVEYS:  Quick Dash 88.6%  COGNITION:  Overall cognitive status: Within functional limits for tasks assessed     SENSATION: WFL  POSTURE: Lt  shoulder forward, rounded, holding the arm on his abdomen  UPPER EXTREMITY ROM:   Passive ROM Right eval Left eval  Shoulder flexion  100 heavy guarding (+) pain  Shoulder extension    Shoulder abduction  90 (+) pain  Shoulder adduction    Shoulder internal rotation  20  Shoulder external rotation  20/30   Elbow flexion    Elbow extension    Wrist flexion    Wrist extension    Wrist ulnar deviation    Wrist radial deviation    Wrist pronation    Wrist supination    (Blank rows = not tested)  UPPER EXTREMITY MMT:  MMT Right eval Left eval  Shoulder flexion  1  Shoulder extension    Shoulder abduction  1  Shoulder adduction    Shoulder internal rotation    Shoulder external rotation    Middle trapezius    Lower trapezius    Elbow flexion  5  Elbow extension  5  Wrist flexion  5  Wrist extension  5  Wrist ulnar deviation    Wrist radial deviation    Wrist pronation    Wrist supination    Grip strength (lbs) 91.6 77  (Blank rows = not tested)  SHOULDER SPECIAL TESTS:  Not tested   JOINT MOBILITY TESTING:  Hypomobile posterior shoulder glide  PALPATION:  Tenderness Lt anterior shoulder, muscle spasm Lt posterior shoulder infraspinatus   TODAY'S TREATMENT:  10/25/22 UBE L2 x87mins Standing AAROM flexion, ext, and IR up back  x10 Rows and Ext yellow 2x10  Chest press 5# 2x10 Supine PROM in all directions  10/18/22 UBE L2 x68mins Ladder x5 into flexion Pec stretch 30s  Wall circles x10 clockwise and counter with RUE helping  Wall slides x10 ER/IR yellow TB 2x10 Rows and ext yellowTB 2x10 Pendulums with 5# KB  PROM in all directions  Shoulder mobilizations grade 2-3 for pain and ROM   10/16/22 UBE L2 x80mins  PROM all directions  Supine flexion 1# x10 Chest press 1# x10 ER/IR with 2# supine x10 Rows and Ext red x10 YellowTB ER with scap retraction 2x10 Ext with dowel x10  10/12/22 UBE L1 x 3 min each Standing AAROM shoulder Flex, Ext, IR up  back  Rows red 2x10 Ext Red 2x10 LUE IR 2x10 yellow  LUE ER yellow 2x10 very limited ROM & weakness Supine LUE flexion x5 with some assist eccentric lowering LUR PROM  in all  directions Rhythmic stabilization  09/19/22:  Seated AAROM Lt shoulder flexion with stick on ground-PT educated on importance of actively trying to flex the shoulder during the assisted movement  Sleep positions with pillows for improved Lt shoulder pain    PATIENT EDUCATION: Education details: sleep posture; eval findings/POC Person educated: Patient Education method: Programmer, multimedia, Demonstration, and Verbal cues Education comprehension: verbalized understanding, returned demonstration, and verbal cues required   HOME EXERCISE PROGRAM: Next visit  ASSESSMENT:  CLINICAL IMPRESSION: Patient arrives ~9 mins late, states that he has more inflammation in L shoulder. L shoulder very tight and increased muscle guarding with passive range of motion in all directions. Difficulty with light weights due to decrease range in L shoulder.    OBJECTIVE IMPAIRMENTS decreased activity tolerance, decreased coordination, decreased ROM, decreased strength, hypomobility, increased fascial restrictions, increased muscle spasms, impaired flexibility, impaired UE functional use, improper body mechanics, postural dysfunction, and pain.   ACTIVITY LIMITATIONS carrying, lifting, sleeping, bed mobility, bathing, dressing, reach over head, and caring for others  PARTICIPATION LIMITATIONS: cleaning, laundry, interpersonal relationship, community activity, occupation, and yard work  PERSONAL FACTORS Age, Past/current experiences, and 1-2 comorbidities: Lt shoulder surgery, stroke 2021 with Lt sided weakness  are also affecting patient's functional outcome.   REHAB POTENTIAL: Good  CLINICAL DECISION MAKING: Evolving/moderate complexity  EVALUATION COMPLEXITY: Moderate   GOALS: Goals reviewed with patient? Yes  SHORT TERM GOALS:  Target date: 11/29/2022  (Remove Blue Hyperlink)  Pt will be independent with HEP to increase Lt shoulder ROM and decrease pain. Goal status: INITIAL   LONG TERM GOALS: Target date: 01/03/2023  (Remove Blue Hyperlink)  Pt will have Lt shoulder strength of atleast 3/5 MMT. Baseline:  Goal status: INITIAL  2.  Pt will have improved Lt shoulder flexion A/ROM to atleast 120deg for reaching overhead. Baseline:  Goal status: INITIAL  3.  Pt will have improved Lt grip strength to within 10lb of the Rt. Baseline:  Goal status: INITIAL  4.  Pt will report atleast 50% improvement in his Lt shoulder pain with daily activity.  Baseline:  Goal status: INITIAL  5.  Pt's QuickDash score will improve to greater than 40% from the start of PT. Baseline: 88% Goal status: INITIAL     PLAN: PT FREQUENCY: 2x/week  PT DURATION: 10 weeks  PLANNED INTERVENTIONS: Therapeutic exercises, Therapeutic activity, Neuromuscular re-education, Patient/Family education, Self Care, Joint mobilization, Dry Needling, Manual therapy, and Re-evaluation  PLAN FOR NEXT SESSION: establish EGB:TDVVO, scap control   11:44 AM,10/25/22 Cassie Freer PT, DPT [facility] [employer]  [phone]

## 2022-10-25 ENCOUNTER — Ambulatory Visit: Payer: Medicaid Other | Attending: Physician Assistant

## 2022-10-25 DIAGNOSIS — M6281 Muscle weakness (generalized): Secondary | ICD-10-CM | POA: Insufficient documentation

## 2022-10-25 DIAGNOSIS — M25512 Pain in left shoulder: Secondary | ICD-10-CM | POA: Diagnosis not present

## 2022-10-25 DIAGNOSIS — R600 Localized edema: Secondary | ICD-10-CM | POA: Diagnosis not present

## 2022-10-25 DIAGNOSIS — G8929 Other chronic pain: Secondary | ICD-10-CM | POA: Diagnosis not present

## 2022-10-25 DIAGNOSIS — M25612 Stiffness of left shoulder, not elsewhere classified: Secondary | ICD-10-CM | POA: Diagnosis not present

## 2022-10-30 ENCOUNTER — Ambulatory Visit: Payer: Medicaid Other | Admitting: Physical Therapy

## 2022-10-30 ENCOUNTER — Encounter: Payer: Self-pay | Admitting: Physical Therapy

## 2022-10-30 DIAGNOSIS — G8929 Other chronic pain: Secondary | ICD-10-CM | POA: Diagnosis not present

## 2022-10-30 DIAGNOSIS — M25512 Pain in left shoulder: Secondary | ICD-10-CM

## 2022-10-30 DIAGNOSIS — M25612 Stiffness of left shoulder, not elsewhere classified: Secondary | ICD-10-CM

## 2022-10-30 DIAGNOSIS — R600 Localized edema: Secondary | ICD-10-CM

## 2022-10-30 DIAGNOSIS — M6281 Muscle weakness (generalized): Secondary | ICD-10-CM | POA: Diagnosis not present

## 2022-10-30 NOTE — Therapy (Signed)
OUTPATIENT PHYSICAL THERAPY SHOULDER TREATMENT   Patient Name: Aaron Gibson MRN: [id number] DOB:1966-04-30, 56 y.o., male Today's Date: 10/30/2022   PT End of Session - 10/30/22 1055     Visit Number 6    Authorization Time Period 09/19/22 to 11/28/22    PT Start Time 1100    PT Stop Time 1145    PT Time Calculation (min) 45 min    Activity Tolerance Patient tolerated treatment well    Behavior During Therapy [facility] for tasks assessed/performed                Past Medical History:  Diagnosis Date   Hypertension    untreated   Morbid obesity ([place])    Pre-diabetes    per patient report   Stroke [facility])    2021- left sided weakness per patient report   Past Surgical History:  Procedure Laterality Date   BUBBLE STUDY  01/02/2021   Procedure: BUBBLE STUDY;  Surgeon: Freada Bergeron, MD;  Location: [facility] ENDOSCOPY;  Service: Cardiovascular;;   SHOULDER ARTHROSCOPY WITH SUBACROMIAL DECOMPRESSION, ROTATOR CUFF REPAIR AND BICEP TENDON REPAIR Left 08/01/2022   Procedure: LEFT SHOULDER SCOPE, EXTENSIVE DEBRIDEMENT, SUBACROMIAL DECOMPRESSION, BICEPS TENODESIS.;  Surgeon: Leandrew Koyanagi, MD;  Location: [place];  Service: Orthopedics;  Laterality: Left;   TEE WITHOUT CARDIOVERSION N/A 01/02/2021   Procedure: TRANSESOPHAGEAL ECHOCARDIOGRAM (TEE);  Surgeon: Freada Bergeron, MD;  Location: [facility] ENDOSCOPY;  Service: Cardiovascular;  Laterality: N/A;   TONSILLECTOMY     UMBILICAL HERNIA REPAIR N/A 03/15/2015   Procedure: UMBILICAL HERNIA REPAIR WITH MESH;  Surgeon: Coralie Keens, MD;  Location: [place];  Service: General;  Laterality: N/A;   Patient Active Problem List   Diagnosis Date Noted   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 08/01/2022   Impingement syndrome of left shoulder 07/12/2022   Partial nontraumatic tear of left rotator cuff 07/12/2022   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   Cerebrovascular accident (CVA) of right  basal ganglia ([place]) 01/02/2021   CVA (cerebral vascular accident) ([place]) 12/29/2020   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  12/28/2020   Chest pain, musculoskeletal 12/28/2020   Morbid obesity ([place])    Hypertension     PCP: Mackie Pai, PA-C  REFERRING PROVIDER: Dwana Melena, PA-C  REFERRING DIAG: s/p arthroscopy Lt shoulder  THERAPY DIAG:  Stiffness of left shoulder, not elsewhere classified  Left shoulder pain, unspecified chronicity  Muscle weakness (generalized)  Chronic left shoulder pain  Localized edema  Rationale for Evaluation and Treatment Rehabilitation  ONSET DATE: 08/01/22  SUBJECTIVE:  SUBJECTIVE STATEMENT: "Its getting their"  PERTINENT HISTORY: Lt shoulder arthroscopy 07/29/22 Stroke 2021 Lt sided weakness  PAIN:  Are you having pain? Yes: NPRS scale: 0/10 Pain location: Lt anterior shoulder  Pain description: throbbing, sharp pain when moving the arm Aggravating factors: sleeping on the Lt side, trying to move it overhead Relieving factors: keeping the arm in close  PRECAUTIONS: Shoulder no lifting more than 2# for 1st 6 weeks   WEIGHT BEARING RESTRICTIONS No  FALLS:  Has patient fallen in last 6 months? No  LIVING ENVIRONMENT: Lives with: lives with their family Lives in: House/apartment   OCCUPATION: No work, 2 boys 8 and 10 yr, daughter just graduated  PLOF: Independent  PATIENT GOALS  decrease pain and be able to use the Lt arm  OBJECTIVE:   DIAGNOSTIC FINDINGS:    PATIENT SURVEYS:  Quick Dash 88.6%  COGNITION:  Overall cognitive status: Within functional limits for tasks assessed     SENSATION: WFL  POSTURE: Lt shoulder forward, rounded, holding the arm on his abdomen  UPPER EXTREMITY ROM:    Passive ROM Right eval Left eval  Shoulder flexion  100 heavy guarding (+) pain  Shoulder extension    Shoulder abduction  90 (+) pain  Shoulder adduction    Shoulder internal rotation  20  Shoulder external rotation  20/30   Elbow flexion    Elbow extension    Wrist flexion    Wrist extension    Wrist ulnar deviation    Wrist radial deviation    Wrist pronation    Wrist supination    (Blank rows = not tested)  UPPER EXTREMITY MMT:  MMT Right eval Left eval  Shoulder flexion  1  Shoulder extension    Shoulder abduction  1  Shoulder adduction    Shoulder internal rotation    Shoulder external rotation    Middle trapezius    Lower trapezius    Elbow flexion  5  Elbow extension  5  Wrist flexion  5  Wrist extension  5  Wrist ulnar deviation    Wrist radial deviation    Wrist pronation    Wrist supination    Grip strength (lbs) 91.6 77  (Blank rows = not tested)  SHOULDER SPECIAL TESTS:  Not tested   JOINT MOBILITY TESTING:  Hypomobile posterior shoulder glide  PALPATION:  Tenderness Lt anterior shoulder, muscle spasm Lt posterior shoulder infraspinatus   TODAY'S TREATMENT:  10/30/22 UBE L2 x 3 min each Seated Row & Lats 25lb 2x10   Chest press 5lb 2x10 Standing AAROM flexion, ext, and IR up back  x10 Standing shoulder abd 2x10  Supine LUE ER/IR 1lb Supine LUE flex  Supine PROM in all directions   10/25/22 UBE L2 x21mins Standing AAROM flexion, ext, and IR up back  x10 Rows and Ext yellow 2x10  Chest press 5# 2x10 Supine PROM in all directions  10/18/22 UBE L2 x3mins Ladder x5 into flexion Pec stretch 30s  Wall circles x10 clockwise and counter with RUE helping  Wall slides x10 ER/IR yellow TB 2x10 Rows and ext yellowTB 2x10 Pendulums with 5# KB  PROM in all directions  Shoulder mobilizations grade 2-3 for pain and ROM   10/16/22 UBE L2 x48mins  PROM all directions  Supine flexion 1# x10 Chest press 1# x10 ER/IR with 2# supine  x10 Rows and Ext red x10 YellowTB ER with scap retraction 2x10 Ext with dowel x10  10/12/22 UBE L1 x 3 min each Standing  AAROM shoulder Flex, Ext, IR up back  Rows red 2x10 Ext Red 2x10 LUE IR 2x10 yellow  LUE ER yellow 2x10 very limited ROM & weakness Supine LUE flexion x5 with some assist eccentric lowering LUR PROM in all  directions Rhythmic stabilization  09/19/22:  Seated AAROM Lt shoulder flexion with stick on ground-PT educated on importance of actively trying to flex the shoulder during the assisted movement  Sleep positions with pillows for improved Lt shoulder pain    PATIENT EDUCATION: Education details: sleep posture; eval findings/POC Person educated: Patient Education method: Programmer, multimedia, Demonstration, and Verbal cues Education comprehension: verbalized understanding, returned demonstration, and verbal cues required   HOME EXERCISE PROGRAM: Next visit  ASSESSMENT:  CLINICAL IMPRESSION: Patient arrives doing ok reporting some LUE improvement. L shoulder is very weak with limited ROM. Good effort throughout session. Some pain and guarding with PROM. Postural cue required with seated rows.   OBJECTIVE IMPAIRMENTS decreased activity tolerance, decreased coordination, decreased ROM, decreased strength, hypomobility, increased fascial restrictions, increased muscle spasms, impaired flexibility, impaired UE functional use, improper body mechanics, postural dysfunction, and pain.   ACTIVITY LIMITATIONS carrying, lifting, sleeping, bed mobility, bathing, dressing, reach over head, and caring for others  PARTICIPATION LIMITATIONS: cleaning, laundry, interpersonal relationship, community activity, occupation, and yard work  PERSONAL FACTORS Age, Past/current experiences, and 1-2 comorbidities: Lt shoulder surgery, stroke 2021 with Lt sided weakness  are also affecting patient's functional outcome.   REHAB POTENTIAL: Good  CLINICAL DECISION MAKING: Evolving/moderate  complexity  EVALUATION COMPLEXITY: Moderate   GOALS: Goals reviewed with patient? Yes  SHORT TERM GOALS: Target date: 12/04/2022  (Remove Blue Hyperlink)  Pt will be independent with HEP to increase Lt shoulder ROM and decrease pain. Goal status: INITIAL   LONG TERM GOALS: Target date: 01/08/2023  (Remove Blue Hyperlink)  Pt will have Lt shoulder strength of atleast 3/5 MMT. Baseline:  Goal status: INITIAL  2.  Pt will have improved Lt shoulder flexion A/ROM to atleast 120deg for reaching overhead. Baseline:  Goal status: INITIAL  3.  Pt will have improved Lt grip strength to within 10lb of the Rt. Baseline:  Goal status: INITIAL  4.  Pt will report atleast 50% improvement in his Lt shoulder pain with daily activity.  Baseline:  Goal status: INITIAL  5.  Pt's QuickDash score will improve to greater than 40% from the start of PT. Baseline: 88% Goal status: INITIAL     PLAN: PT FREQUENCY: 2x/week  PT DURATION: 10 weeks  PLANNED INTERVENTIONS: Therapeutic exercises, Therapeutic activity, Neuromuscular re-education, Patient/Family education, Self Care, Joint mobilization, Dry Needling, Manual therapy, and Re-evaluation  PLAN FOR NEXT SESSION: establish [id number], scap control   10:56 AM,10/30/22 Cassie Freer PT, DPT [facility] [place]  [phone]

## 2022-11-01 ENCOUNTER — Encounter: Payer: Self-pay | Admitting: Physical Therapy

## 2022-11-01 ENCOUNTER — Ambulatory Visit: Payer: Medicaid Other | Admitting: Physical Therapy

## 2022-11-01 DIAGNOSIS — M25512 Pain in left shoulder: Secondary | ICD-10-CM

## 2022-11-01 DIAGNOSIS — R600 Localized edema: Secondary | ICD-10-CM | POA: Diagnosis not present

## 2022-11-01 DIAGNOSIS — M6281 Muscle weakness (generalized): Secondary | ICD-10-CM

## 2022-11-01 DIAGNOSIS — M25612 Stiffness of left shoulder, not elsewhere classified: Secondary | ICD-10-CM

## 2022-11-01 DIAGNOSIS — G8929 Other chronic pain: Secondary | ICD-10-CM | POA: Diagnosis not present

## 2022-11-01 NOTE — Therapy (Signed)
OUTPATIENT PHYSICAL THERAPY SHOULDER TREATMENT   Patient Name: Aaron Gibson MRN: [id number] DOB:[birth year], 56 y.o., male Today's Date: 11/01/2022   PT End of Session - 11/01/22 1132     Visit Number 7    Authorization Type [employer]    Authorization Time Period 09/19/22 to 11/28/22    PT Start Time 1135    PT Stop Time 1220    PT Time Calculation (min) 45 min    Activity Tolerance Patient tolerated treatment well    Behavior During Therapy [facility] for tasks assessed/performed                Past Medical History:  Diagnosis Date   Hypertension    untreated   Morbid obesity ([facility])    Pre-diabetes    per patient report   Stroke [facility])    2021- left sided weakness per patient report   Past Surgical History:  Procedure Laterality Date   BUBBLE STUDY  01/02/2021   Procedure: BUBBLE STUDY;  Surgeon: Meriam Sprague, MD;  Location: [facility] ENDOSCOPY;  Service: Cardiovascular;;   SHOULDER ARTHROSCOPY WITH SUBACROMIAL DECOMPRESSION, ROTATOR CUFF REPAIR AND BICEP TENDON REPAIR Left 08/01/2022   Procedure: LEFT SHOULDER SCOPE, EXTENSIVE DEBRIDEMENT, SUBACROMIAL DECOMPRESSION, BICEPS TENODESIS.;  Surgeon: Tarry Kos, MD;  Location: [facility] OR;  Service: Orthopedics;  Laterality: Left;   TEE WITHOUT CARDIOVERSION N/A 01/02/2021   Procedure: TRANSESOPHAGEAL ECHOCARDIOGRAM (TEE);  Surgeon: Meriam Sprague, MD;  Location: [facility] ENDOSCOPY;  Service: Cardiovascular;  Laterality: N/A;   TONSILLECTOMY     UMBILICAL HERNIA REPAIR N/A 03/15/2015   Procedure: UMBILICAL HERNIA REPAIR WITH MESH;  Surgeon: Abigail Miyamoto, MD;  Location: [facility] OR;  Service: General;  Laterality: N/A;   Patient Active Problem List   Diagnosis Date Noted   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 08/01/2022   Impingement syndrome of left shoulder 07/12/2022   Partial nontraumatic tear of left rotator cuff 07/12/2022   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    Cerebrovascular accident (CVA) of right basal ganglia ([facility]) 01/02/2021   CVA (cerebral vascular accident) ([facility]) 12/29/2020   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  12/28/2020   Chest pain, musculoskeletal 12/28/2020   Morbid obesity ([facility])    Hypertension     PCP: Esperanza Richters, PA-C  REFERRING PROVIDER: Jari Sportsman, PA-C  REFERRING DIAG: s/p arthroscopy Lt shoulder  THERAPY DIAG:  Stiffness of left shoulder, not elsewhere classified  Muscle weakness (generalized)  Localized edema  Left shoulder pain, unspecified chronicity  Rationale for Evaluation and Treatment Rehabilitation  ONSET DATE: 08/01/22  SUBJECTIVE:  SUBJECTIVE STATEMENT: "Other than moving Im all right"  PERTINENT HISTORY: Lt shoulder arthroscopy 07/29/22 Stroke 2021 Lt sided weakness  PAIN:  Are you having pain? Yes: NPRS scale: 0/10 Pain location: Lt anterior shoulder  Pain description: throbbing, sharp pain when moving the arm Aggravating factors: sleeping on the Lt side, trying to move it overhead Relieving factors: keeping the arm in close  PRECAUTIONS: Shoulder no lifting more than 2# for 1st 6 weeks   WEIGHT BEARING RESTRICTIONS No  FALLS:  Has patient fallen in last 6 months? No  LIVING ENVIRONMENT: Lives with: lives with their family Lives in: House/apartment   OCCUPATION: No work, 2 boys 8 and 10 yr, daughter just graduated  PLOF: Independent  PATIENT GOALS  decrease pain and be able to use the Lt arm  OBJECTIVE:   DIAGNOSTIC FINDINGS:    PATIENT SURVEYS:  Quick Dash 88.6%  COGNITION:  Overall cognitive status: Within functional limits for tasks assessed     SENSATION: WFL  POSTURE: Lt shoulder forward, rounded, holding the arm on his abdomen  UPPER  EXTREMITY ROM:   Passive ROM Right eval Left eval L AROM  Shoulder flexion  100 heavy guarding (+) pain 70  Shoulder extension     Shoulder abduction  90 (+) pain 65  Shoulder adduction     Shoulder internal rotation  20   Shoulder external rotation  20/30    Elbow flexion     Elbow extension     Wrist flexion     Wrist extension     Wrist ulnar deviation     Wrist radial deviation     Wrist pronation     Wrist supination     (Blank rows = not tested)  UPPER EXTREMITY MMT:  MMT Right eval Left eval  Shoulder flexion  1  Shoulder extension    Shoulder abduction  1  Shoulder adduction    Shoulder internal rotation    Shoulder external rotation    Middle trapezius    Lower trapezius    Elbow flexion  5  Elbow extension  5  Wrist flexion  5  Wrist extension  5  Wrist ulnar deviation    Wrist radial deviation    Wrist pronation    Wrist supination    Grip strength (lbs) 91.6 77  (Blank rows = not tested)  SHOULDER SPECIAL TESTS:  Not tested   JOINT MOBILITY TESTING:  Hypomobile posterior shoulder glide  PALPATION:  Tenderness Lt anterior shoulder, muscle spasm Lt posterior shoulder infraspinatus   TODAY'S TREATMENT:  11/01/22 UBE L3 x 3 min Seated Row & Lats 25lb 2x10   Shoulder Ext 10lb 2x10 Standing rows 10lb 2x10 Standing rows 10lb 2x10 Shoulder abd 2x10 Shoulder flex 2x10  Supine flex LUE 1lb 2x10 Supine PROM in all directions   10/30/22 UBE L2 x 3 min each Seated Row & Lats 25lb 2x10   Chest press 5lb 2x10 Standing AAROM flexion, ext, and IR up back  x10 Standing shoulder abd 2x10  Supine LUE ER/IR 1lb Supine LUE flex  Supine PROM in all directions   10/25/22 UBE L2 x67mins Standing AAROM flexion, ext, and IR up back  x10 Rows and Ext yellow 2x10  Chest press 5# 2x10 Supine PROM in all directions  10/18/22 UBE L2 x72mins Ladder x5 into flexion Pec stretch 30s  Wall circles x10 clockwise and counter with RUE helping  Wall slides  x10 ER/IR yellow TB 2x10 Rows and ext yellowTB 2x10 Pendulums with  5# KB  PROM in all directions  Shoulder mobilizations grade 2-3 for pain and ROM   10/16/22 UBE L2 x34mins  PROM all directions  Supine flexion 1# x10 Chest press 1# x10 ER/IR with 2# supine x10 Rows and Ext red x10 YellowTB ER with scap retraction 2x10 Ext with dowel x10  10/12/22 UBE L1 x 3 min each Standing AAROM shoulder Flex, Ext, IR up back  Rows red 2x10 Ext Red 2x10 LUE IR 2x10 yellow  LUE ER yellow 2x10 very limited ROM & weakness Supine LUE flexion x5 with some assist eccentric lowering LUR PROM in all  directions Rhythmic stabilization  09/19/22:  Seated AAROM Lt shoulder flexion with stick on ground-PT educated on importance of actively trying to flex the shoulder during the assisted movement  Sleep positions with pillows for improved Lt shoulder pain    PATIENT EDUCATION: Education details: sleep posture; eval findings/POC Person educated: Patient Education method: Programmer, multimedia, Demonstration, and Verbal cues Education comprehension: verbalized understanding, returned demonstration, and verbal cues required   HOME EXERCISE PROGRAM: Next visit  ASSESSMENT:  CLINICAL IMPRESSION: Patient arrives doing ok. Good effort throughout session. Pt does well with pulling interventions. Shoulder weakness noted with flexion type movements away form body.  Some pain and guarding with PROM. Postural cue required with seated rows and standing shoulder ext..   OBJECTIVE IMPAIRMENTS decreased activity tolerance, decreased coordination, decreased ROM, decreased strength, hypomobility, increased fascial restrictions, increased muscle spasms, impaired flexibility, impaired UE functional use, improper body mechanics, postural dysfunction, and pain.   ACTIVITY LIMITATIONS carrying, lifting, sleeping, bed mobility, bathing, dressing, reach over head, and caring for others  PARTICIPATION LIMITATIONS: cleaning,  laundry, interpersonal relationship, community activity, occupation, and yard work  PERSONAL FACTORS Age, Past/current experiences, and 1-2 comorbidities: Lt shoulder surgery, stroke 2021 with Lt sided weakness  are also affecting patient's functional outcome.   REHAB POTENTIAL: Good  CLINICAL DECISION MAKING: Evolving/moderate complexity  EVALUATION COMPLEXITY: Moderate   GOALS: Goals reviewed with patient? Yes  SHORT TERM GOALS: Target date: 12/06/2022  (Remove Blue Hyperlink)  Pt will be independent with HEP to increase Lt shoulder ROM and decrease pain. Goal status: INITIAL   LONG TERM GOALS: Target date: 01/10/2023  (Remove Blue Hyperlink)  Pt will have Lt shoulder strength of atleast 3/5 MMT. Baseline:  Goal status: INITIAL  2.  Pt will have improved Lt shoulder flexion A/ROM to atleast 120deg for reaching overhead. Baseline:  Goal status: INITIAL  3.  Pt will have improved Lt grip strength to within 10lb of the Rt. Baseline:  Goal status: INITIAL  4.  Pt will report atleast 50% improvement in his Lt shoulder pain with daily activity.  Baseline:  Goal status: INITIAL  5.  Pt's QuickDash score will improve to greater than 40% from the start of PT. Baseline: 88% Goal status: INITIAL     PLAN: PT FREQUENCY: 2x/week  PT DURATION: 10 weeks  PLANNED INTERVENTIONS: Therapeutic exercises, Therapeutic activity, Neuromuscular re-education, Patient/Family education, Self Care, Joint mobilization, Dry Needling, Manual therapy, and Re-evaluation  PLAN FOR NEXT SESSION: establish [id number], scap control   11:32 AM,11/01/22 Cassie Freer PT, DPT [facility] [place]  [phone]

## 2022-11-06 ENCOUNTER — Ambulatory Visit: Payer: Medicaid Other | Admitting: Physical Therapy

## 2022-11-06 ENCOUNTER — Encounter: Payer: Self-pay | Admitting: Physical Therapy

## 2022-11-06 DIAGNOSIS — M25612 Stiffness of left shoulder, not elsewhere classified: Secondary | ICD-10-CM | POA: Diagnosis not present

## 2022-11-06 DIAGNOSIS — M6281 Muscle weakness (generalized): Secondary | ICD-10-CM

## 2022-11-06 DIAGNOSIS — M25512 Pain in left shoulder: Secondary | ICD-10-CM

## 2022-11-06 DIAGNOSIS — R600 Localized edema: Secondary | ICD-10-CM

## 2022-11-06 DIAGNOSIS — G8929 Other chronic pain: Secondary | ICD-10-CM

## 2022-11-06 NOTE — Therapy (Signed)
OUTPATIENT PHYSICAL THERAPY SHOULDER TREATMENT   Patient Name: Aaron Gibson MRN: [id number] DOB:August 16, 1966, 56 y.o., male Today's Date: 11/06/2022   PT End of Session - 11/06/22 1056     Visit Number 8    Authorization Type [employer]    PT Start Time 1100    PT Stop Time 1145    PT Time Calculation (min) 45 min                Past Medical History:  Diagnosis Date   Hypertension    untreated   Morbid obesity ([place])    Pre-diabetes    per patient report   Stroke [facility])    2021- left sided weakness per patient report   Past Surgical History:  Procedure Laterality Date   BUBBLE STUDY  01/02/2021   Procedure: BUBBLE STUDY;  Surgeon: Freada Bergeron, MD;  Location: [facility] ENDOSCOPY;  Service: Cardiovascular;;   SHOULDER ARTHROSCOPY WITH SUBACROMIAL DECOMPRESSION, ROTATOR CUFF REPAIR AND BICEP TENDON REPAIR Left 08/01/2022   Procedure: LEFT SHOULDER SCOPE, EXTENSIVE DEBRIDEMENT, SUBACROMIAL DECOMPRESSION, BICEPS TENODESIS.;  Surgeon: Leandrew Koyanagi, MD;  Location: [place];  Service: Orthopedics;  Laterality: Left;   TEE WITHOUT CARDIOVERSION N/A 01/02/2021   Procedure: TRANSESOPHAGEAL ECHOCARDIOGRAM (TEE);  Surgeon: Freada Bergeron, MD;  Location: [facility] ENDOSCOPY;  Service: Cardiovascular;  Laterality: N/A;   TONSILLECTOMY     UMBILICAL HERNIA REPAIR N/A 03/15/2015   Procedure: UMBILICAL HERNIA REPAIR WITH MESH;  Surgeon: Coralie Keens, MD;  Location: [place];  Service: General;  Laterality: N/A;   Patient Active Problem List   Diagnosis Date Noted   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 08/01/2022   Impingement syndrome of left shoulder 07/12/2022   Partial nontraumatic tear of left rotator cuff 07/12/2022   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   Cerebrovascular accident (CVA) of right basal ganglia ([place]) 01/02/2021   CVA (cerebral vascular accident) ([place]) 12/29/2020   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  12/28/2020   Chest pain, musculoskeletal 12/28/2020   Morbid obesity ([place])    Hypertension     PCP: Mackie Pai, PA-C  REFERRING PROVIDER: Dwana Melena, PA-C  REFERRING DIAG: s/p arthroscopy Lt shoulder  THERAPY DIAG:  Stiffness of left shoulder, not elsewhere classified  Muscle weakness (generalized)  Localized edema  Left shoulder pain, unspecified chronicity  Chronic left shoulder pain  Rationale for Evaluation and Treatment Rehabilitation  ONSET DATE: 08/01/22  SUBJECTIVE:  SUBJECTIVE STATEMENT: "I can't complaint about much"  PERTINENT HISTORY: Lt shoulder arthroscopy 07/29/22 Stroke 2021 Lt sided weakness  PAIN:  Are you having pain? Yes: NPRS scale: 0/10 Pain location: Lt anterior shoulder  Pain description: throbbing, sharp pain when moving the arm Aggravating factors: sleeping on the Lt side, trying to move it overhead Relieving factors: keeping the arm in close  PRECAUTIONS: Shoulder no lifting more than 2# for 1st 6 weeks   WEIGHT BEARING RESTRICTIONS No  FALLS:  Has patient fallen in last 6 months? No  LIVING ENVIRONMENT: Lives with: lives with their family Lives in: House/apartment   OCCUPATION: No work, 2 boys 40 and 63 yr, daughter just graduated  PLOF: Independent  PATIENT GOALS  decrease pain and be able to use the Lt arm  OBJECTIVE:   DIAGNOSTIC FINDINGS:    PATIENT SURVEYS:  Quick Dash 88.6%  COGNITION:  Overall cognitive status: Within functional limits for tasks assessed     SENSATION: WFL  POSTURE: Lt shoulder forward, rounded, holding the arm on his abdomen  UPPER EXTREMITY ROM:   Passive ROM Left eval L AROM 11/01/22  Shoulder flexion 100 heavy guarding (+) pain 70  Shoulder extension    Shoulder  abduction 90 (+) pain 65  Shoulder adduction    Shoulder internal rotation 20   Shoulder external rotation 20/30    Elbow flexion    Elbow extension    Wrist flexion    Wrist extension    Wrist ulnar deviation    Wrist radial deviation    Wrist pronation    Wrist supination    (Blank rows = not tested)  UPPER EXTREMITY MMT:  MMT Right eval Left eval  Shoulder flexion  1  Shoulder extension    Shoulder abduction  1  Shoulder adduction    Shoulder internal rotation    Shoulder external rotation    Middle trapezius    Lower trapezius    Elbow flexion  5  Elbow extension  5  Wrist flexion  5  Wrist extension  5  Wrist ulnar deviation    Wrist radial deviation    Wrist pronation    Wrist supination    Grip strength (lbs) 91.6 77  (Blank rows = not tested)  SHOULDER SPECIAL TESTS:  Not tested   JOINT MOBILITY TESTING:  Hypomobile posterior shoulder glide  PALPATION:  Tenderness Lt anterior shoulder, muscle spasm Lt posterior shoulder infraspinatus   TODAY'S TREATMENT:  11/06/22 UBE L3 x 3 min each Rows green 2x15 Shoulder Ext green 2x15 LUE shoulder ER & IR yellow 2x10  AROM shoulder flex and abd x 10 each LUE supine flex 2lb 2x10 LLE supine scaption x10 Supine PROM in all directions   11/01/22 UBE L3 x 3 min Seated Row & Lats 25lb 2x10   Shoulder Ext 10lb 2x10 Standing rows 10lb 2x10 Standing rows 10lb 2x10 Shoulder abd 2x10 Shoulder flex 2x10  Supine flex LUE 1lb 2x10 Supine PROM in all directions   10/30/22 UBE L2 x 3 min each Seated Row & Lats 25lb 2x10   Chest press 5lb 2x10 Standing AAROM flexion, ext, and IR up back  x10 Standing shoulder abd 2x10  Supine LUE ER/IR 1lb Supine LUE flex  Supine PROM in all directions   10/25/22 UBE L2 x44mns Standing AAROM flexion, ext, and IR up back  x10 Rows and Ext yellow 2x10  Chest press 5# 2x10 Supine PROM in all directions  10/18/22 UBE L2 x644ms Ladder x5 into  flexion Pec stretch 30s   Wall circles x10 clockwise and counter with RUE helping  Wall slides x10 ER/IR yellow TB 2x10 Rows and ext yellowTB 2x10 Pendulums with 5# KB  PROM in all directions  Shoulder mobilizations grade 2-3 for pain and ROM   PATIENT EDUCATION: Education details: sleep posture; eval findings/POC Person educated: Patient Education method: Explanation, Demonstration, and Verbal cues Education comprehension: verbalized understanding, returned demonstration, and verbal cues required   HOME EXERCISE PROGRAM: "I do the exercises I do here"  ASSESSMENT:  CLINICAL IMPRESSION: Patient arrives doing ok. Good effort throughout session. Pt does reports some frustration with his R shoulder, saying he thinks the doctor messed min up.. Shoulder weakness noted with flexion type movements away form body. Limited ROM with resisted ER.  Some pain and guarding with PROM. Postural cues required with flexion and abduction to prevent trunk compensation.    OBJECTIVE IMPAIRMENTS decreased activity tolerance, decreased coordination, decreased ROM, decreased strength, hypomobility, increased fascial restrictions, increased muscle spasms, impaired flexibility, impaired UE functional use, improper body mechanics, postural dysfunction, and pain.   ACTIVITY LIMITATIONS carrying, lifting, sleeping, bed mobility, bathing, dressing, reach over head, and caring for others  PARTICIPATION LIMITATIONS: cleaning, laundry, interpersonal relationship, community activity, occupation, and yard work  PERSONAL FACTORS Age, Past/current experiences, and 1-2 comorbidities: Lt shoulder surgery, stroke 2021 with Lt sided weakness  are also affecting patient's functional outcome.   REHAB POTENTIAL: Good  CLINICAL DECISION MAKING: Evolving/moderate complexity  EVALUATION COMPLEXITY: Moderate   GOALS: Goals reviewed with patient? Yes  SHORT TERM GOALS: Target date: 12/11/2022  (Remove Blue Hyperlink)  Pt will be independent  with HEP to increase Lt shoulder ROM and decrease pain. Goal status: Met    LONG TERM GOALS: Target date: 01/15/2023  (Remove Blue Hyperlink)  Pt will have Lt shoulder strength of atleast 3/5 MMT. Baseline:  Goal status: INITIAL  2.  Pt will have improved Lt shoulder flexion A/ROM to atleast 120deg for reaching overhead. Baseline:  Goal status: INITIAL  3.  Pt will have improved Lt grip strength to within 10lb of the Rt. Baseline:  Goal status: INITIAL  4.  Pt will report atleast 50% improvement in his Lt shoulder pain with daily activity.  Baseline:  Goal status: INITIAL  5.  Pt's QuickDash score will improve to greater than 40% from the start of PT. Baseline: 88% Goal status: INITIAL     PLAN: PT FREQUENCY: 2x/week  PT DURATION: 10 weeks  PLANNED INTERVENTIONS: Therapeutic exercises, Therapeutic activity, Neuromuscular re-education, Patient/Family education, Self Care, Joint mobilization, Dry Needling, Manual therapy, and Re-evaluation  PLAN FOR NEXT SESSION: establish YFV:CBSWH, scap control   10:57 AM,11/06/22 Andris Baumann PT, [place] at [place]

## 2022-11-07 ENCOUNTER — Encounter: Payer: Medicaid Other | Admitting: Orthopaedic Surgery

## 2022-11-07 NOTE — Therapy (Signed)
OUTPATIENT PHYSICAL THERAPY SHOULDER TREATMENT   Patient Name: Aaron Gibson MRN: [id number] DOB:August 24, 1966, 56 y.o., male Today's Date: 11/07/2022        Past Medical History:  Diagnosis Date   Hypertension    untreated   Morbid obesity ([place])    Pre-diabetes    per patient report   Stroke [facility])    2021- left sided weakness per patient report   Past Surgical History:  Procedure Laterality Date   BUBBLE STUDY  01/02/2021   Procedure: BUBBLE STUDY;  Surgeon: Freada Bergeron, MD;  Location: [facility] ENDOSCOPY;  Service: Cardiovascular;;   SHOULDER ARTHROSCOPY WITH SUBACROMIAL DECOMPRESSION, ROTATOR CUFF REPAIR AND BICEP TENDON REPAIR Left 08/01/2022   Procedure: LEFT SHOULDER SCOPE, EXTENSIVE DEBRIDEMENT, SUBACROMIAL DECOMPRESSION, BICEPS TENODESIS.;  Surgeon: Leandrew Koyanagi, MD;  Location: [place];  Service: Orthopedics;  Laterality: Left;   TEE WITHOUT CARDIOVERSION N/A 01/02/2021   Procedure: TRANSESOPHAGEAL ECHOCARDIOGRAM (TEE);  Surgeon: Freada Bergeron, MD;  Location: [facility] ENDOSCOPY;  Service: Cardiovascular;  Laterality: N/A;   TONSILLECTOMY     UMBILICAL HERNIA REPAIR N/A 03/15/2015   Procedure: UMBILICAL HERNIA REPAIR WITH MESH;  Surgeon: Coralie Keens, MD;  Location: [place];  Service: General;  Laterality: N/A;   Patient Active Problem List   Diagnosis Date Noted   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 08/01/2022   Impingement syndrome of left shoulder 07/12/2022   Partial nontraumatic tear of left rotator cuff 07/12/2022   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   Cerebrovascular accident (CVA) of right basal ganglia ([place]) 01/02/2021   CVA (cerebral vascular accident) ([place]) 12/29/2020   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  12/28/2020   Chest pain, musculoskeletal 12/28/2020   Morbid obesity ([place])    Hypertension     PCP: Mackie Pai, PA-C  REFERRING PROVIDER:  Dwana Melena, PA-C  REFERRING DIAG: s/p arthroscopy Lt shoulder  THERAPY DIAG:  No diagnosis found.  Rationale for Evaluation and Treatment Rehabilitation  ONSET DATE: 08/01/22  SUBJECTIVE:                                                                                                                                                                                      SUBJECTIVE STATEMENT: Sitting still pain is gone, but can't get over 90d and it hurts then.   PERTINENT HISTORY: Lt shoulder arthroscopy 07/29/22 Stroke 2021 Lt sided weakness  PAIN:  Are you having pain? Yes: NPRS scale: 0/10 Pain location: Lt anterior shoulder  Pain description: throbbing, sharp pain when moving the arm Aggravating factors: sleeping on the Lt side, trying to move it overhead Relieving factors:  keeping the arm in close  PRECAUTIONS: Shoulder no lifting more than 2# for 1st 6 weeks   WEIGHT BEARING RESTRICTIONS No  FALLS:  Has patient fallen in last 6 months? No  LIVING ENVIRONMENT: Lives with: lives with their family Lives in: House/apartment   OCCUPATION: No work, 2 boys 45 and 68 yr, daughter just graduated  PLOF: Independent  PATIENT GOALS  decrease pain and be able to use the Lt arm  OBJECTIVE:   DIAGNOSTIC FINDINGS:    PATIENT SURVEYS:  Quick Dash 88.6%  COGNITION:  Overall cognitive status: Within functional limits for tasks assessed     SENSATION: WFL  POSTURE: Lt shoulder forward, rounded, holding the arm on his abdomen  UPPER EXTREMITY ROM:   Passive ROM Left eval L AROM 11/01/22  Shoulder flexion 100 heavy guarding (+) pain 70  Shoulder extension    Shoulder abduction 90 (+) pain 65  Shoulder adduction    Shoulder internal rotation 20   Shoulder external rotation 20/30    Elbow flexion    Elbow extension    Wrist flexion    Wrist extension    Wrist ulnar deviation    Wrist radial deviation    Wrist pronation    Wrist supination    (Blank rows = not  tested)  UPPER EXTREMITY MMT:  MMT Right eval Left eval  Shoulder flexion  1  Shoulder extension    Shoulder abduction  1  Shoulder adduction    Shoulder internal rotation    Shoulder external rotation    Middle trapezius    Lower trapezius    Elbow flexion  5  Elbow extension  5  Wrist flexion  5  Wrist extension  5  Wrist ulnar deviation    Wrist radial deviation    Wrist pronation    Wrist supination    Grip strength (lbs) 91.6 77  (Blank rows = not tested)  SHOULDER SPECIAL TESTS:  Not tested   JOINT MOBILITY TESTING:  Hypomobile posterior shoulder glide  PALPATION:  Tenderness Lt anterior shoulder, muscle spasm Lt posterior shoulder infraspinatus   TODAY'S TREATMENT:  11/08/22 UBE L2 x25mns Pulleys x10 flexion, x10 abd  Rows and ext green 2x10  yellowTB scpation x10 ER walk out greenTB x5, then IR x5 Lat pull downs 25# x10 Supine PROM all directions    11/06/22 UBE L3 x 3 min each Rows green 2x15 Shoulder Ext green 2x15 LUE shoulder ER & IR yellow 2x10  AROM shoulder flex and abd x 10 each LUE supine flex 2lb 2x10 LLE supine scaption x10 Supine PROM in all directions   11/01/22 UBE L3 x 3 min Seated Row & Lats 25lb 2x10   Shoulder Ext 10lb 2x10 Standing rows 10lb 2x10 Standing rows 10lb 2x10 Shoulder abd 2x10 Shoulder flex 2x10  Supine flex LUE 1lb 2x10 Supine PROM in all directions   10/30/22 UBE L2 x 3 min each Seated Row & Lats 25lb 2x10   Chest press 5lb 2x10 Standing AAROM flexion, ext, and IR up back  x10 Standing shoulder abd 2x10  Supine LUE ER/IR 1lb Supine LUE flex  Supine PROM in all directions   10/25/22 UBE L2 x675ms Standing AAROM flexion, ext, and IR up back  x10 Rows and Ext yellow 2x10  Chest press 5# 2x10 Supine PROM in all directions  10/18/22 UBE L2 x6m23m Ladder x5 into flexion Pec stretch 30s  Wall circles x10 clockwise and counter with RUE helping  Wall slides x10 ER/IR yellow  TB 2x10 Rows and ext  yellowTB 2x10 Pendulums with 5# KB  PROM in all directions  Shoulder mobilizations grade 2-3 for pain and ROM   PATIENT EDUCATION: Education details: sleep posture; eval findings/POC Person educated: Patient Education method: Explanation, Demonstration, and Verbal cues Education comprehension: verbalized understanding, returned demonstration, and verbal cues required   HOME EXERCISE PROGRAM: "I do the exercises I do here"  ASSESSMENT:  CLINICAL IMPRESSION: Patient arrives doing ok. Good effort throughout session. Limited ROM in all directions especially abduction but can achieve close to full PROM with flexion and IR/ER. Gives good effort throughout session. States he wants to go back to his doctor to get more imaging done as he is frustrated with his limited progression.   OBJECTIVE IMPAIRMENTS decreased activity tolerance, decreased coordination, decreased ROM, decreased strength, hypomobility, increased fascial restrictions, increased muscle spasms, impaired flexibility, impaired UE functional use, improper body mechanics, postural dysfunction, and pain.   ACTIVITY LIMITATIONS carrying, lifting, sleeping, bed mobility, bathing, dressing, reach over head, and caring for others  PARTICIPATION LIMITATIONS: cleaning, laundry, interpersonal relationship, community activity, occupation, and yard work  PERSONAL FACTORS Age, Past/current experiences, and 1-2 comorbidities: Lt shoulder surgery, stroke 2021 with Lt sided weakness  are also affecting patient's functional outcome.   REHAB POTENTIAL: Good  CLINICAL DECISION MAKING: Evolving/moderate complexity  EVALUATION COMPLEXITY: Moderate   GOALS: Goals reviewed with patient? Yes  SHORT TERM GOALS: Target date: 12/12/2022  (Remove Blue Hyperlink)  Pt will be independent with HEP to increase Lt shoulder ROM and decrease pain. Goal status: Met    LONG TERM GOALS: Target date: 01/16/2023  (Remove Blue Hyperlink)  Pt will have Lt  shoulder strength of atleast 3/5 MMT. Baseline:  Goal status: INITIAL  2.  Pt will have improved Lt shoulder flexion A/ROM to atleast 120deg for reaching overhead. Baseline:  Goal status: INITIAL  3.  Pt will have improved Lt grip strength to within 10lb of the Rt. Baseline:  Goal status: INITIAL  4.  Pt will report atleast 50% improvement in his Lt shoulder pain with daily activity.  Baseline:  Goal status: INITIAL  5.  Pt's QuickDash score will improve to greater than 40% from the start of PT. Baseline: 88% Goal status: INITIAL     PLAN: PT FREQUENCY: 2x/week  PT DURATION: 10 weeks  PLANNED INTERVENTIONS: Therapeutic exercises, Therapeutic activity, Neuromuscular re-education, Patient/Family education, Self Care, Joint mobilization, Dry Needling, Manual therapy, and Re-evaluation  PLAN FOR NEXT SESSION: establish PBA:QVOHC, scap control   3:06 PM,11/07/22 Andris Baumann PT, [place] at [place]

## 2022-11-08 ENCOUNTER — Ambulatory Visit: Payer: Medicaid Other

## 2022-11-08 DIAGNOSIS — M25512 Pain in left shoulder: Secondary | ICD-10-CM

## 2022-11-08 DIAGNOSIS — M25612 Stiffness of left shoulder, not elsewhere classified: Secondary | ICD-10-CM | POA: Diagnosis not present

## 2022-11-08 DIAGNOSIS — M6281 Muscle weakness (generalized): Secondary | ICD-10-CM | POA: Diagnosis not present

## 2022-11-08 DIAGNOSIS — G8929 Other chronic pain: Secondary | ICD-10-CM

## 2022-11-08 DIAGNOSIS — R600 Localized edema: Secondary | ICD-10-CM | POA: Diagnosis not present

## 2022-11-13 ENCOUNTER — Encounter: Payer: Self-pay | Admitting: Physical Therapy

## 2022-11-13 ENCOUNTER — Ambulatory Visit: Payer: Medicaid Other | Admitting: Physical Therapy

## 2022-11-13 DIAGNOSIS — M25612 Stiffness of left shoulder, not elsewhere classified: Secondary | ICD-10-CM | POA: Diagnosis not present

## 2022-11-13 DIAGNOSIS — M6281 Muscle weakness (generalized): Secondary | ICD-10-CM

## 2022-11-13 DIAGNOSIS — R600 Localized edema: Secondary | ICD-10-CM | POA: Diagnosis not present

## 2022-11-13 DIAGNOSIS — G8929 Other chronic pain: Secondary | ICD-10-CM | POA: Diagnosis not present

## 2022-11-13 DIAGNOSIS — M25512 Pain in left shoulder: Secondary | ICD-10-CM | POA: Diagnosis not present

## 2022-11-13 NOTE — Therapy (Signed)
OUTPATIENT PHYSICAL THERAPY SHOULDER TREATMENT  Progress Note Reporting Period 09/19/22 to 11/13/22  See note below for Objective Data and Assessment of Progress/Goals.     Patient Name: Aaron Gibson MRN: [id number] DOB:[birth year], 56 y.o., male Today's Date: 11/13/2022   PT End of Session - 11/13/22 1105     Visit Number 10    PT Start Time 1100    PT Stop Time 7505    PT Time Calculation (min) 45 min    Activity Tolerance Patient tolerated treatment well    Behavior During Therapy WFL for tasks assessed/performed                 Past Medical History:  Diagnosis Date   Hypertension    untreated   Morbid obesity ([place])    Pre-diabetes    per patient report   Stroke [facility])    2021- left sided weakness per patient report   Past Surgical History:  Procedure Laterality Date   BUBBLE STUDY  01/02/2021   Procedure: BUBBLE STUDY;  Surgeon: Freada Bergeron, MD;  Location: [facility] ENDOSCOPY;  Service: Cardiovascular;;   SHOULDER ARTHROSCOPY WITH SUBACROMIAL DECOMPRESSION, ROTATOR CUFF REPAIR AND BICEP TENDON REPAIR Left 08/01/2022   Procedure: LEFT SHOULDER SCOPE, EXTENSIVE DEBRIDEMENT, SUBACROMIAL DECOMPRESSION, BICEPS TENODESIS.;  Surgeon: Leandrew Koyanagi, MD;  Location: [facility];  Service: Orthopedics;  Laterality: Left;   TEE WITHOUT CARDIOVERSION N/A 01/02/2021   Procedure: TRANSESOPHAGEAL ECHOCARDIOGRAM (TEE);  Surgeon: Freada Bergeron, MD;  Location: [facility] ENDOSCOPY;  Service: Cardiovascular;  Laterality: N/A;   TONSILLECTOMY     UMBILICAL HERNIA REPAIR N/A 03/15/2015   Procedure: UMBILICAL HERNIA REPAIR WITH MESH;  Surgeon: Coralie Keens, MD;  Location: [facility];  Service: General;  Laterality: N/A;   Patient Active Problem List   Diagnosis Date Noted   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 08/01/2022   Impingement syndrome of left shoulder 07/12/2022   Partial nontraumatic tear of left rotator cuff 07/12/2022   Adhesive  capsulitis of left shoulder 05/30/2021   Cerebrovascular accident (CVA) of right basal ganglia ([place]) 01/02/2021   CVA (cerebral vascular accident) ([place]) 12/29/2020   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  12/28/2020   Chest pain, musculoskeletal 12/28/2020   Morbid obesity ([place])    Hypertension     PCP: Mackie Pai, PA-C  REFERRING PROVIDER: Dwana Melena, PA-C  REFERRING DIAG: s/p arthroscopy Lt shoulder  THERAPY DIAG:  Stiffness of left shoulder, not elsewhere classified  Chronic left shoulder pain  Muscle weakness (generalized)  Left shoulder pain, unspecified chronicity  Localized edema  Rationale for Evaluation and Treatment Rehabilitation  ONSET DATE: 08/01/22  SUBJECTIVE:  SUBJECTIVE STATEMENT: Same old, Same old  PERTINENT HISTORY: Lt shoulder arthroscopy 07/29/22 Stroke 2021 Lt sided weakness  PAIN:  Are you having pain? Yes: NPRS scale: 0/10 Pain location: Lt anterior shoulder  Pain description: throbbing, sharp pain when moving the arm Aggravating factors: sleeping on the Lt side, trying to move it overhead Relieving factors: keeping the arm in close  PRECAUTIONS: Shoulder no lifting more than 2# for 1st 6 weeks   WEIGHT BEARING RESTRICTIONS No  FALLS:  Has patient fallen in last 6 months? No  LIVING ENVIRONMENT: Lives with: lives with their family Lives in: House/apartment   OCCUPATION: No work, 2 boys 31 and 10 yr, daughter just graduated  PLOF: Independent  PATIENT GOALS  decrease pain and be able to use the Lt arm  OBJECTIVE:   DIAGNOSTIC FINDINGS:    PATIENT SURVEYS:  Quick Dash 88.6%  COGNITION:  Overall cognitive status: Within functional limits for tasks assessed     SENSATION: WFL  POSTURE: Lt shoulder  forward, rounded, holding the arm on his abdomen  UPPER EXTREMITY ROM:   Passive ROM Left eval L AROM 11/01/22 L AROM 11/13/22   Shoulder flexion 100 heavy guarding (+) pain 70 71  Shoulder extension     Shoulder abduction 90 (+) pain 65 65  Shoulder adduction     Shoulder internal rotation 20    Shoulder external rotation 20/30     Elbow flexion     Elbow extension     Wrist flexion     Wrist extension     Wrist ulnar deviation     Wrist radial deviation     Wrist pronation     Wrist supination     (Blank rows = not tested)  UPPER EXTREMITY MMT:  MMT Right eval Left eval Left 11/13/22  Shoulder flexion  1 3  Shoulder extension     Shoulder abduction  1 3  Shoulder adduction     Shoulder internal rotation     Shoulder external rotation     Middle trapezius     Lower trapezius     Elbow flexion  5   Elbow extension  5   Wrist flexion  5   Wrist extension  5   Wrist ulnar deviation     Wrist radial deviation     Wrist pronation     Wrist supination     Grip strength (lbs) 91.6 77   (Blank rows = not tested)  SHOULDER SPECIAL TESTS:  Not tested   JOINT MOBILITY TESTING:  Hypomobile posterior shoulder glide  PALPATION:  Tenderness Lt anterior shoulder, muscle spasm Lt posterior shoulder infraspinatus   TODAY'S TREATMENT:  11/13/22 UBE L 4 x 3 min each Grip R 111 L 105 Seated Rows 35lb 2x10 Chest press 15lb 2x10 Shoulder Flex & abd 1lb 2x10 LUE ER/IR yellow 2x10 Supine flex 2lb 2x10   11/08/22 UBE L2 x56mns Pulleys x10 flexion, x10 abd  Rows and ext green 2x10  yellowTB scpation x10 ER walk out greenTB x5, then IR x5 Lat pull downs 25# x10 Supine PROM all directions    11/06/22 UBE L3 x 3 min each Rows green 2x15 Shoulder Ext green 2x15 LUE shoulder ER & IR yellow 2x10  AROM shoulder flex and abd x 10 each LUE supine flex 2lb 2x10 LLE supine scaption x10 Supine PROM in all directions   11/01/22 UBE L3 x 3 min Seated Row & Lats 25lb  2x10   Shoulder Ext 10lb 2x10  Standing rows 10lb 2x10 Standing rows 10lb 2x10 Shoulder abd 2x10 Shoulder flex 2x10  Supine flex LUE 1lb 2x10 Supine PROM in all directions   10/30/22 UBE L2 x 3 min each Seated Row & Lats 25lb 2x10   Chest press 5lb 2x10 Standing AAROM flexion, ext, and IR up back  x10 Standing shoulder abd 2x10  Supine LUE ER/IR 1lb Supine LUE flex  Supine PROM in all directions     PATIENT EDUCATION: Education details: sleep posture; eval findings/POC Person educated: Patient Education method: Consulting civil engineer, Demonstration, and Verbal cues Education comprehension: verbalized understanding, returned demonstration, and verbal cues required   HOME EXERCISE PROGRAM: "I do the exercises I do here"  ASSESSMENT:  CLINICAL IMPRESSION: Patient arrives doing ok. Good effort throughout session. Limited ROM in all directions especially abduction. He has progressed towards LTG's meeting some.  Gives good effort throughout session. States he wants to go back to his doctor tomorrow.   OBJECTIVE IMPAIRMENTS decreased activity tolerance, decreased coordination, decreased ROM, decreased strength, hypomobility, increased fascial restrictions, increased muscle spasms, impaired flexibility, impaired UE functional use, improper body mechanics, postural dysfunction, and pain.   ACTIVITY LIMITATIONS carrying, lifting, sleeping, bed mobility, bathing, dressing, reach over head, and caring for others  PARTICIPATION LIMITATIONS: cleaning, laundry, interpersonal relationship, community activity, occupation, and yard work  PERSONAL FACTORS Age, Past/current experiences, and 1-2 comorbidities: Lt shoulder surgery, stroke 2021 with Lt sided weakness  are also affecting patient's functional outcome.   REHAB POTENTIAL: Good  CLINICAL DECISION MAKING: Evolving/moderate complexity  EVALUATION COMPLEXITY: Moderate   GOALS: Goals reviewed with patient? Yes  SHORT TERM GOALS: Target  date: 12/18/2022  (Remove Blue Hyperlink)  Pt will be independent with HEP to increase Lt shoulder ROM and decrease pain. Goal status: Met    LONG TERM GOALS: Target date: 01/22/2023  (Remove Blue Hyperlink)  Pt will have Lt shoulder strength of atleast 3/5 MMT. Baseline:  Goal status: Met  11/13/22  2.  Pt will have improved Lt shoulder flexion A/ROM to atleast 120deg for reaching overhead. Baseline:  Goal status: On going  3.  Pt will have improved Lt grip strength to within 10lb of the Rt. Baseline:  Goal status: Met 11/13/22  4.  Pt will report atleast 50% improvement in his Lt shoulder pain with daily activity.  Baseline:  Goal status: Partly Met 11/13/22  5.  Pt's QuickDash score will improve to greater than 40% from the start of PT. Baseline: 88% Goal status: INITIAL     PLAN: PT FREQUENCY: 2x/week  PT DURATION: 10 weeks  PLANNED INTERVENTIONS: Therapeutic exercises, Therapeutic activity, Neuromuscular re-education, Patient/Family education, Self Care, Joint mobilization, Dry Needling, Manual therapy, and Re-evaluation  PLAN FOR NEXT SESSION: establish [id number], scap control   11:05 AM,11/13/22 Andris Baumann PT, [place] [facility] [place]

## 2022-11-14 ENCOUNTER — Ambulatory Visit: Payer: Self-pay

## 2022-11-14 ENCOUNTER — Ambulatory Visit (INDEPENDENT_AMBULATORY_CARE_PROVIDER_SITE_OTHER): Payer: Medicaid Other | Admitting: Orthopaedic Surgery

## 2022-11-14 ENCOUNTER — Ambulatory Visit (INDEPENDENT_AMBULATORY_CARE_PROVIDER_SITE_OTHER): Payer: Medicaid Other | Admitting: Sports Medicine

## 2022-11-14 DIAGNOSIS — M7502 Adhesive capsulitis of left shoulder: Secondary | ICD-10-CM

## 2022-11-14 DIAGNOSIS — M7542 Impingement syndrome of left shoulder: Secondary | ICD-10-CM

## 2022-11-14 DIAGNOSIS — S43432A Superior glenoid labrum lesion of left shoulder, initial encounter: Secondary | ICD-10-CM

## 2022-11-14 DIAGNOSIS — M75112 Incomplete rotator cuff tear or rupture of left shoulder, not specified as traumatic: Secondary | ICD-10-CM | POA: Diagnosis not present

## 2022-11-14 MED ORDER — METHYLPREDNISOLONE ACETATE 40 MG/ML IJ SUSP
80.0000 mg | INTRAMUSCULAR | Status: AC | PRN
  Administered 2022-11-14: 80 mg via INTRA_ARTICULAR

## 2022-11-14 MED ORDER — LIDOCAINE HCL 1 % IJ SOLN
2.0000 mL | INTRAMUSCULAR | Status: AC | PRN
  Administered 2022-11-14: 2 mL

## 2022-11-14 MED ORDER — BUPIVACAINE HCL 0.25 % IJ SOLN
2.0000 mL | INTRAMUSCULAR | Status: AC | PRN
  Administered 2022-11-14: 2 mL via INTRA_ARTICULAR

## 2022-11-14 NOTE — Progress Notes (Signed)
Post-Op Visit Note   Patient: Aaron Gibson           Date of Birth: 12-24-1966           MRN: [id number] Visit Date: 11/14/2022 PCP: Esperanza Richters, PA-C   Assessment & Plan:  Chief Complaint:  Chief Complaint  Patient presents with   Left Shoulder - Routine Post Op    08/01/22 left shoulder scope   Visit Diagnoses:  1. 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   2. Impingement syndrome of left shoulder   3. Partial nontraumatic tear of left rotator cuff   4. Adhesive capsulitis of left shoulder     Plan: Mr. Hetland is status post left shoulder arthroscopy and manipulation for adhesive capsulitis on 08/01/2022.  He states that he does not feel much pain but his shoulder has stiffen back up.  Examination left shoulder shows fully healed surgical scars.  No signs of infection.  Forward flexion to 70 degrees, shoulder abduction to 65 degrees, external rotation to 30 degrees.  Given these findings I would like to send him to Dr. Shon Baton for an ultrasound-guided glenohumeral injection.  He will need to continue PT to work on aggressive range of motion.  I would like to recheck him in 6 weeks.  Could consider another injection at that time if his range of motion does not improve significantly.  Follow-Up Instructions: Return in about 6 weeks (around 12/26/2022).   Orders:  No orders of the defined types were placed in this encounter.  No orders of the defined types were placed in this encounter.   Imaging: No results found.  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  Past Surgical History:  Procedure Laterality Date   BUBBLE STUDY  01/02/2021   Procedure: BUBBLE STUDY;  Surgeon: Meriam Sprague, MD;  Location: [facility] ENDOSCOPY;  Service: Cardiovascular;;   SHOULDER ARTHROSCOPY WITH SUBACROMIAL DECOMPRESSION, ROTATOR CUFF REPAIR AND BICEP TENDON REPAIR Left 08/01/2022   Procedure: LEFT SHOULDER SCOPE, EXTENSIVE DEBRIDEMENT, SUBACROMIAL DECOMPRESSION, BICEPS TENODESIS.;  Surgeon: Tarry Kos, MD;  Location: [facility] OR;  Service: Orthopedics;  Laterality: Left;   TEE WITHOUT CARDIOVERSION N/A 01/02/2021   Procedure: TRANSESOPHAGEAL ECHOCARDIOGRAM (TEE);  Surgeon: Meriam Sprague, MD;  Location: [facility] ENDOSCOPY;  Service: Cardiovascular;  Laterality: N/A;   TONSILLECTOMY     UMBILICAL HERNIA REPAIR N/A 03/15/2015   Procedure: UMBILICAL HERNIA REPAIR WITH MESH;  Surgeon: Abigail Miyamoto, MD;  Location: [facility] OR;  Service: General;  Laterality: N/A;   Social History   Occupational History   Occupation: Unemployed   Tobacco Use   Smoking status: Every Day  Packs/day: 0.50    Years: 29.00    Total pack years: 14.50    Types: Cigarettes   Smokeless tobacco: Never   Tobacco comments:    quit 12/28/2020  Vaping Use   Vaping Use: Never used  Substance and Sexual Activity   Alcohol use: Yes    Comment: occasionally holidays   Drug use: No   Sexual activity: Yes

## 2022-11-14 NOTE — Progress Notes (Signed)
   Procedure Note  Patient: Damarius Karnes             Date of Birth: Jul 08, 1966           MRN: [id number]             Visit Date: 11/14/2022  Procedures: Visit Diagnoses:  1. Impingement syndrome of left shoulder   2. 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   3. Adhesive capsulitis of left shoulder    Large Joint Inj: L glenohumeral on 11/14/2022 11:07 AM Indications: pain Details: 22 G 3.5 in needle, ultrasound-guided posterior approach Medications: 2 mL lidocaine 1 %; 2 mL bupivacaine 0.25 %; 80 mg methylPREDNISolone acetate 40 MG/ML Outcome: tolerated well, no immediate complications  US-guided glenohumeral joint injection, left shoulder After discussion on risks/benefits/indications, informed verbal consent was obtained. A timeout was then performed. The patient was positioned lying lateral recumbent on examination table. The patient's shoulder was prepped with betadine and multiple alcohol swabs and utilizing ultrasound guidance, the patient's glenohumeral joint was identified on ultrasound. Using ultrasound guidance a 22-gauge, 3.5 inch needle with a mixture of 2:2:2 cc's lidocaine:bupivicaine:depomedrol was directed from a lateral to medial direction via in-plane technique into the glenohumeral joint with visualization of appropriate spread of injectate into the joint. Patient tolerated the procedure well without immediate complications.      Procedure, treatment alternatives, risks and benefits explained, specific risks discussed. Consent was given by the patient. Immediately prior to procedure a time out was called to verify the correct patient, procedure, equipment, support staff and site/side marked as required. Patient was prepped and draped in the usual sterile fashion.     - I evaluated the patient about 10 minutes post-injection and they had improvement in pain and range of motion - follow-up with Dr. Roda Shutters as indicated; I am happy to  see them as needed - given his likely adhesive capsulitis component, will see him back in 2-weeks and if not > 80% improved, consider 1 additional repeat injection  Madelyn Brunner, DO Primary Care Sports Medicine Physician  [facility] - Orthopedics  This note was dictated using Dragon naturally speaking software and may contain errors in syntax, spelling, or content which have not been identified prior to signing this note.

## 2022-11-16 ENCOUNTER — Encounter: Payer: Self-pay | Admitting: Sports Medicine

## 2022-11-16 ENCOUNTER — Other Ambulatory Visit: Payer: Self-pay | Admitting: Medical

## 2022-11-19 ENCOUNTER — Other Ambulatory Visit: Payer: Self-pay | Admitting: Sports Medicine

## 2022-11-19 MED ORDER — CYCLOBENZAPRINE HCL 5 MG PO TABS: 5.0000 mg | ORAL_TABLET | Freq: Three times a day (TID) | ORAL | 0 refills | Status: AC | PRN

## 2022-11-19 NOTE — Therapy (Signed)
OUTPATIENT PHYSICAL THERAPY SHOULDER TREATMENT  Progress Note Reporting Period 09/19/22 to 11/13/22  See note below for Objective Data and Assessment of Progress/Goals.     Patient Name: Aaron Gibson MRN: [id number] DOB:[birth year], 56 y.o., male Today's Date: 11/13/2022   PT End of Session - 11/13/22 1105     Visit Number 10    PT Start Time 1100    PT Stop Time 7505    PT Time Calculation (min) 45 min    Activity Tolerance Patient tolerated treatment well    Behavior During Therapy WFL for tasks assessed/performed                 Past Medical History:  Diagnosis Date   Hypertension    untreated   Morbid obesity ([place])    Pre-diabetes    per patient report   Stroke [facility])    2021- left sided weakness per patient report   Past Surgical History:  Procedure Laterality Date   BUBBLE STUDY  01/02/2021   Procedure: BUBBLE STUDY;  Surgeon: Freada Bergeron, MD;  Location: [facility] ENDOSCOPY;  Service: Cardiovascular;;   SHOULDER ARTHROSCOPY WITH SUBACROMIAL DECOMPRESSION, ROTATOR CUFF REPAIR AND BICEP TENDON REPAIR Left 08/01/2022   Procedure: LEFT SHOULDER SCOPE, EXTENSIVE DEBRIDEMENT, SUBACROMIAL DECOMPRESSION, BICEPS TENODESIS.;  Surgeon: Leandrew Koyanagi, MD;  Location: [facility];  Service: Orthopedics;  Laterality: Left;   TEE WITHOUT CARDIOVERSION N/A 01/02/2021   Procedure: TRANSESOPHAGEAL ECHOCARDIOGRAM (TEE);  Surgeon: Freada Bergeron, MD;  Location: [facility] ENDOSCOPY;  Service: Cardiovascular;  Laterality: N/A;   TONSILLECTOMY     UMBILICAL HERNIA REPAIR N/A 03/15/2015   Procedure: UMBILICAL HERNIA REPAIR WITH MESH;  Surgeon: Coralie Keens, MD;  Location: [facility];  Service: General;  Laterality: N/A;   Patient Active Problem List   Diagnosis Date Noted   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 08/01/2022   Impingement syndrome of left shoulder 07/12/2022   Partial nontraumatic tear of left rotator cuff 07/12/2022   Adhesive  capsulitis of left shoulder 05/30/2021   Cerebrovascular accident (CVA) of right basal ganglia ([place]) 01/02/2021   CVA (cerebral vascular accident) ([place]) 12/29/2020   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  12/28/2020   Chest pain, musculoskeletal 12/28/2020   Morbid obesity ([place])    Hypertension     PCP: Mackie Pai, PA-C  REFERRING PROVIDER: Dwana Melena, PA-C  REFERRING DIAG: s/p arthroscopy Lt shoulder  THERAPY DIAG:  Stiffness of left shoulder, not elsewhere classified  Chronic left shoulder pain  Muscle weakness (generalized)  Left shoulder pain, unspecified chronicity  Localized edema  Rationale for Evaluation and Treatment Rehabilitation  ONSET DATE: 08/01/22  SUBJECTIVE:  SUBJECTIVE STATEMENT: Same old, Same old  PERTINENT HISTORY: Lt shoulder arthroscopy 07/29/22 Stroke 2021 Lt sided weakness  PAIN:  Are you having pain? Yes: NPRS scale: 0/10 Pain location: Lt anterior shoulder  Pain description: throbbing, sharp pain when moving the arm Aggravating factors: sleeping on the Lt side, trying to move it overhead Relieving factors: keeping the arm in close  PRECAUTIONS: Shoulder no lifting more than 2# for 1st 6 weeks   WEIGHT BEARING RESTRICTIONS No  FALLS:  Has patient fallen in last 6 months? No  LIVING ENVIRONMENT: Lives with: lives with their family Lives in: House/apartment   OCCUPATION: No work, 2 boys 51 and 10 yr, daughter just graduated  PLOF: Independent  PATIENT GOALS  decrease pain and be able to use the Lt arm  OBJECTIVE:   DIAGNOSTIC FINDINGS:    PATIENT SURVEYS:  Quick Dash 88.6%  COGNITION:  Overall cognitive status: Within functional limits for tasks assessed     SENSATION: WFL  POSTURE: Lt shoulder  forward, rounded, holding the arm on his abdomen  UPPER EXTREMITY ROM:   Passive ROM Left eval L AROM 11/01/22 L AROM 11/13/22   Shoulder flexion 100 heavy guarding (+) pain 70 71  Shoulder extension     Shoulder abduction 90 (+) pain 65 65  Shoulder adduction     Shoulder internal rotation 20    Shoulder external rotation 20/30     Elbow flexion     Elbow extension     Wrist flexion     Wrist extension     Wrist ulnar deviation     Wrist radial deviation     Wrist pronation     Wrist supination     (Blank rows = not tested)  UPPER EXTREMITY MMT:  MMT Right eval Left eval Left 11/13/22  Shoulder flexion  1 3  Shoulder extension     Shoulder abduction  1 3  Shoulder adduction     Shoulder internal rotation     Shoulder external rotation     Middle trapezius     Lower trapezius     Elbow flexion  5   Elbow extension  5   Wrist flexion  5   Wrist extension  5   Wrist ulnar deviation     Wrist radial deviation     Wrist pronation     Wrist supination     Grip strength (lbs) 91.6 77   (Blank rows = not tested)  SHOULDER SPECIAL TESTS:  Not tested   JOINT MOBILITY TESTING:  Hypomobile posterior shoulder glide  PALPATION:  Tenderness Lt anterior shoulder, muscle spasm Lt posterior shoulder infraspinatus   TODAY'S TREATMENT:  11/20/22 UBE L4  Seated rows  Lat pull downs  Chest press Shoulder flexion and abd 1# 2x10 PROM  Supine chest fly 1#    11/13/22 UBE L 4 x 3 min each Grip R 111 L 105 Seated Rows 35lb 2x10 Chest press 15lb 2x10 Shoulder Flex & abd 1lb 2x10 LUE ER/IR yellow 2x10 Supine flex 2lb 2x10   11/08/22 UBE L2 x57mns Pulleys x10 flexion, x10 abd  Rows and ext green 2x10  yellowTB scpation x10 ER walk out greenTB x5, then IR x5 Lat pull downs 25# x10 Supine PROM all directions    11/06/22 UBE L3 x 3 min each Rows green 2x15 Shoulder Ext green 2x15 LUE shoulder ER & IR yellow 2x10  AROM shoulder flex and abd x 10 each LUE  supine flex 2lb 2x10 LLE  supine scaption x10 Supine PROM in all directions   11/01/22 UBE L3 x 3 min Seated Row & Lats 25lb 2x10   Shoulder Ext 10lb 2x10 Standing rows 10lb 2x10 Standing rows 10lb 2x10 Shoulder abd 2x10 Shoulder flex 2x10  Supine flex LUE 1lb 2x10 Supine PROM in all directions    PATIENT EDUCATION: Education details: sleep posture; eval findings/POC Person educated: Patient Education method: Consulting civil engineer, Demonstration, and Verbal cues Education comprehension: verbalized understanding, returned demonstration, and verbal cues required   HOME EXERCISE PROGRAM: "I do the exercises I do here"  ASSESSMENT:  CLINICAL IMPRESSION: Patient arrives doing ok. Good effort throughout session. Limited ROM in all directions especially abduction. He has progressed towards LTG's meeting some.  Gives good effort throughout session. States he wants to go back to his doctor tomorrow.   OBJECTIVE IMPAIRMENTS decreased activity tolerance, decreased coordination, decreased ROM, decreased strength, hypomobility, increased fascial restrictions, increased muscle spasms, impaired flexibility, impaired UE functional use, improper body mechanics, postural dysfunction, and pain.   ACTIVITY LIMITATIONS carrying, lifting, sleeping, bed mobility, bathing, dressing, reach over head, and caring for others  PARTICIPATION LIMITATIONS: cleaning, laundry, interpersonal relationship, community activity, occupation, and yard work  PERSONAL FACTORS Age, Past/current experiences, and 1-2 comorbidities: Lt shoulder surgery, stroke 2021 with Lt sided weakness  are also affecting patient's functional outcome.   REHAB POTENTIAL: Good  CLINICAL DECISION MAKING: Evolving/moderate complexity  EVALUATION COMPLEXITY: Moderate   GOALS: Goals reviewed with patient? Yes  SHORT TERM GOALS: Target date: 12/18/2022  (Remove Blue Hyperlink)  Pt will be independent with HEP to increase Lt shoulder ROM and  decrease pain. Goal status: Met    LONG TERM GOALS: Target date: 01/22/2023  (Remove Blue Hyperlink)  Pt will have Lt shoulder strength of atleast 3/5 MMT. Baseline:  Goal status: Met  11/13/22  2.  Pt will have improved Lt shoulder flexion A/ROM to atleast 120deg for reaching overhead. Baseline:  Goal status: On going  3.  Pt will have improved Lt grip strength to within 10lb of the Rt. Baseline:  Goal status: Met 11/13/22  4.  Pt will report atleast 50% improvement in his Lt shoulder pain with daily activity.  Baseline:  Goal status: Partly Met 11/13/22  5.  Pt's QuickDash score will improve to greater than 40% from the start of PT. Baseline: 88% Goal status: INITIAL     PLAN: PT FREQUENCY: 2x/week  PT DURATION: 10 weeks  PLANNED INTERVENTIONS: Therapeutic exercises, Therapeutic activity, Neuromuscular re-education, Patient/Family education, Self Care, Joint mobilization, Dry Needling, Manual therapy, and Re-evaluation  PLAN FOR NEXT SESSION: establish [id number], scap control   11:05 AM,11/13/22 Andris Baumann PT, [place] at [place]

## 2022-11-20 ENCOUNTER — Encounter: Payer: Self-pay | Admitting: Sports Medicine

## 2022-11-20 ENCOUNTER — Ambulatory Visit: Payer: Medicaid Other

## 2022-11-20 DIAGNOSIS — G8929 Other chronic pain: Secondary | ICD-10-CM | POA: Diagnosis not present

## 2022-11-20 DIAGNOSIS — M25512 Pain in left shoulder: Secondary | ICD-10-CM

## 2022-11-20 DIAGNOSIS — M6281 Muscle weakness (generalized): Secondary | ICD-10-CM | POA: Diagnosis not present

## 2022-11-20 DIAGNOSIS — M25612 Stiffness of left shoulder, not elsewhere classified: Secondary | ICD-10-CM | POA: Diagnosis not present

## 2022-11-20 DIAGNOSIS — R600 Localized edema: Secondary | ICD-10-CM | POA: Diagnosis not present

## 2022-11-21 NOTE — Therapy (Signed)
OUTPATIENT PHYSICAL THERAPY SHOULDER TREATMENT  Patient Name: Aaron Gibson MRN: [id number] DOB:[birth year], 56 y.o., male Today's Date: 11/22/2022   PT End of Session - 11/22/22 1026     Visit Number 12    PT Start Time 1030    PT Stop Time 1118    PT Time Calculation (min) 48 min    Activity Tolerance Patient tolerated treatment well    Behavior During Therapy WFL for tasks assessed/performed                  Past Medical History:  Diagnosis Date   Hypertension    untreated   Morbid obesity ([place])    Pre-diabetes    per patient report   Stroke [facility])    2021- left sided weakness per patient report   Past Surgical History:  Procedure Laterality Date   BUBBLE STUDY  01/02/2021   Procedure: BUBBLE STUDY;  Surgeon: Freada Bergeron, MD;  Location: [facility] ENDOSCOPY;  Service: Cardiovascular;;   SHOULDER ARTHROSCOPY WITH SUBACROMIAL DECOMPRESSION, ROTATOR CUFF REPAIR AND BICEP TENDON REPAIR Left 08/01/2022   Procedure: LEFT SHOULDER SCOPE, EXTENSIVE DEBRIDEMENT, SUBACROMIAL DECOMPRESSION, BICEPS TENODESIS.;  Surgeon: Leandrew Koyanagi, MD;  Location: [place];  Service: Orthopedics;  Laterality: Left;   TEE WITHOUT CARDIOVERSION N/A 01/02/2021   Procedure: TRANSESOPHAGEAL ECHOCARDIOGRAM (TEE);  Surgeon: Freada Bergeron, MD;  Location: [facility] ENDOSCOPY;  Service: Cardiovascular;  Laterality: N/A;   TONSILLECTOMY     UMBILICAL HERNIA REPAIR N/A 03/15/2015   Procedure: UMBILICAL HERNIA REPAIR WITH MESH;  Surgeon: Coralie Keens, MD;  Location: [place];  Service: General;  Laterality: N/A;   Patient Active Problem List   Diagnosis Date Noted   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 08/01/2022   Impingement syndrome of left shoulder 07/12/2022   Partial nontraumatic tear of left rotator cuff 07/12/2022   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   Cerebrovascular accident (CVA) of right basal ganglia ([place]) 01/02/2021   CVA (cerebral  vascular accident) ([place]) 12/29/2020   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 The receptionist at the place where I got the shot told me about active release therapy to help with scar tissue build up.   PERTINENT HISTORY: Lt shoulder arthroscopy 07/29/22 Stroke 2021 Lt sided weakness  PAIN:  Are you having pain? Yes: NPRS scale: 1/10 Pain location: Lt anterior shoulder  Pain description: throbbing, sharp pain when moving the arm Aggravating factors: sleeping on the Lt side, trying to move it overhead Relieving factors: keeping the arm in close  PRECAUTIONS: Shoulder no lifting more than 2# for 1st 6 weeks   WEIGHT BEARING RESTRICTIONS No  FALLS:  Has patient fallen in last 6 months? No  LIVING ENVIRONMENT: Lives with: lives with their family Lives in: House/apartment   OCCUPATION: No work, 2 boys 48 and 10 yr, daughter just graduated  PLOF: Independent  PATIENT GOALS  decrease pain and be able to use the Lt arm  OBJECTIVE:   DIAGNOSTIC FINDINGS:    PATIENT SURVEYS:  Quick Dash 88.6%  COGNITION:  Overall cognitive status: Within functional limits for tasks assessed     SENSATION: WFL  POSTURE: Lt  shoulder forward, rounded, holding the arm on his abdomen  UPPER EXTREMITY ROM:   Passive ROM Left eval L AROM 11/01/22 L AROM 11/13/22   Shoulder flexion 100 heavy guarding (+) pain 70 71  Shoulder extension     Shoulder abduction 90 (+) pain 65 65  Shoulder adduction     Shoulder internal rotation 20    Shoulder external rotation 20/30     Elbow flexion     Elbow extension     Wrist flexion     Wrist extension     Wrist ulnar deviation     Wrist radial deviation     Wrist pronation     Wrist supination     (Blank rows = not tested)  UPPER EXTREMITY MMT:  MMT Right eval Left eval Left 11/13/22  Shoulder flexion  1 3  Shoulder extension     Shoulder abduction  1 3  Shoulder adduction     Shoulder internal rotation     Shoulder external rotation     Middle trapezius     Lower trapezius     Elbow flexion  5   Elbow extension  5   Wrist flexion  5   Wrist extension  5   Wrist ulnar deviation     Wrist radial deviation     Wrist pronation     Wrist supination     Grip strength (lbs) 91.6 77   (Blank rows = not tested)  SHOULDER SPECIAL TESTS:  Not tested   JOINT MOBILITY TESTING:  Hypomobile posterior shoulder glide  PALPATION:  Tenderness Lt anterior shoulder, muscle spasm Lt posterior shoulder infraspinatus   TODAY'S TREATMENT:  11/22/22 UBE L 3 x 3 min each Seated Rows 35lb 2x10 Chest press 15lb 2x10 LUE ER/IR yellow 2x10 PROM all directions  Manual therapy- STM to deltoids, scapular muscles and scar tissue massage. MET into ER x3 5s hold. Grade 3-4 shoulder mobs  11/20/22 UBE L2 x25mns  Seated rows 35# 2x10 Lat pull downs 25# 2x10 Chest press 15# 2x10 Shoulder flexion and abd 1# 2x10 PROM all directions  Supine chest fly 1# 2x10   11/13/22 UBE L 4 x 3 min each Grip R 111 L 105 Seated Rows 35lb 2x10 Chest press 15lb 2x10 Shoulder Flex & abd 1lb 2x10 LUE ER/IR yellow  2x10 Supine flex 2lb 2x10   11/08/22 UBE L2 x18mns Pulleys x10  flexion, x10 abd  Rows and ext green 2x10  yellowTB scpation x10 ER walk out greenTB x5, then IR x5 Lat pull downs 25# x10 Supine PROM all directions    PATIENT EDUCATION: Education details: sleep posture; eval findings/POC Person educated: Patient Education method: EConsulting civil engineer Demonstration, and Verbal cues Education comprehension: verbalized understanding, returned demonstration, and verbal cues required   HOME EXERCISE PROGRAM: "I do the exercises I do here"  ASSESSMENT:  CLINICAL IMPRESSION: Patient arrives doing ok, low levels of pain but still has lots of limitations with range. We focused mostly on manual therapy today as patient states he feels really tight and thinks he needs some hands on deep tissue massage. He has mild tightness in scapular muscles and pecs but his ROM limits are more likely within his joint and weakness rather than tight muscles. He has some tendon movement over bone with flexion and abduction. I was able to get him into further ranges with PROM. His pain is mostly pinpointed to deltoid tuberosity with movements.  OBJECTIVE IMPAIRMENTS decreased activity tolerance, decreased coordination, decreased ROM, decreased strength, hypomobility, increased fascial restrictions, increased muscle spasms, impaired flexibility, impaired UE functional use, improper body mechanics, postural dysfunction, and pain.   ACTIVITY LIMITATIONS carrying, lifting, sleeping, bed mobility, bathing, dressing, reach over head, and caring for others  PARTICIPATION LIMITATIONS: cleaning, laundry, interpersonal relationship, community activity, occupation, and yard work  PERSONAL FACTORS Age, Past/current experiences, and 1-2 comorbidities: Lt shoulder surgery, stroke 2021 with Lt sided weakness  are also affecting patient's functional outcome.   REHAB POTENTIAL: Good  CLINICAL DECISION MAKING: Evolving/moderate complexity  EVALUATION COMPLEXITY: Moderate   GOALS: Goals reviewed  with patient? Yes  SHORT TERM GOALS: Target date: 12/27/2022  (Remove Blue Hyperlink)  Pt will be independent with HEP to increase Lt shoulder ROM and decrease pain. Goal status: Met    LONG TERM GOALS: Target date: 01/31/2023  (Remove Blue Hyperlink)  Pt will have Lt shoulder strength of atleast 3/5 MMT. Baseline:  Goal status: Met  11/13/22  2.  Pt will have improved Lt shoulder flexion A/ROM to atleast 120deg for reaching overhead. Baseline:  Goal status: On going  3.  Pt will have improved Lt grip strength to within 10lb of the Rt. Baseline:  Goal status: Met 11/13/22  4.  Pt will report atleast 50% improvement in his Lt shoulder pain with daily activity.  Baseline:  Goal status: Partly Met 11/13/22  5.  Pt's QuickDash score will improve to greater than 40% from the start of PT. Baseline: 88% Goal status: INITIAL     PLAN: PT FREQUENCY: 2x/week  PT DURATION: 10 weeks  PLANNED INTERVENTIONS: Therapeutic exercises, Therapeutic activity, Neuromuscular re-education, Patient/Family education, Self Care, Joint mobilization, Dry Needling, Manual therapy, and Re-evaluation  PLAN FOR NEXT SESSION: establish [id number]:[id number] scap control   11:20 AM,11/22/22 MAndris BaumannPT, DSmileyat BEntiat

## 2022-11-22 ENCOUNTER — Ambulatory Visit: Payer: Medicaid Other

## 2022-11-22 DIAGNOSIS — M25512 Pain in left shoulder: Secondary | ICD-10-CM

## 2022-11-22 DIAGNOSIS — G8929 Other chronic pain: Secondary | ICD-10-CM | POA: Diagnosis not present

## 2022-11-22 DIAGNOSIS — M25612 Stiffness of left shoulder, not elsewhere classified: Secondary | ICD-10-CM

## 2022-11-22 DIAGNOSIS — R600 Localized edema: Secondary | ICD-10-CM | POA: Diagnosis not present

## 2022-11-22 DIAGNOSIS — M6281 Muscle weakness (generalized): Secondary | ICD-10-CM | POA: Diagnosis not present

## 2022-12-05 ENCOUNTER — Ambulatory Visit: Payer: Self-pay

## 2022-12-05 ENCOUNTER — Encounter: Payer: Self-pay | Admitting: Sports Medicine

## 2022-12-05 ENCOUNTER — Ambulatory Visit (INDEPENDENT_AMBULATORY_CARE_PROVIDER_SITE_OTHER): Payer: Medicaid Other | Admitting: Sports Medicine

## 2022-12-05 VITALS — BP 145/89 | HR 84

## 2022-12-05 DIAGNOSIS — G8929 Other chronic pain: Secondary | ICD-10-CM

## 2022-12-05 DIAGNOSIS — Z9889 Other specified postprocedural states: Secondary | ICD-10-CM

## 2022-12-05 DIAGNOSIS — M7502 Adhesive capsulitis of left shoulder: Secondary | ICD-10-CM | POA: Diagnosis not present

## 2022-12-05 DIAGNOSIS — M25512 Pain in left shoulder: Secondary | ICD-10-CM | POA: Diagnosis not present

## 2022-12-05 DIAGNOSIS — M7542 Impingement syndrome of left shoulder: Secondary | ICD-10-CM

## 2022-12-05 MED ORDER — LIDOCAINE HCL 1 % IJ SOLN
2.0000 mL | INTRAMUSCULAR | Status: AC | PRN
  Administered 2022-12-05: 2 mL

## 2022-12-05 MED ORDER — METHYLPREDNISOLONE ACETATE 40 MG/ML IJ SUSP
80.0000 mg | INTRAMUSCULAR | Status: AC | PRN
  Administered 2022-12-05: 80 mg via INTRA_ARTICULAR

## 2022-12-05 MED ORDER — BUPIVACAINE HCL 0.25 % IJ SOLN
2.0000 mL | INTRAMUSCULAR | Status: AC | PRN
  Administered 2022-12-05: 2 mL via INTRA_ARTICULAR

## 2022-12-05 NOTE — Progress Notes (Signed)
     Procedure Note  Patient: Aaron Gibson             Date of Birth: 22-Oct-1966           MRN: [id number]             Visit Date: 12/05/2022  Procedures: Visit Diagnoses:  1. Impingement syndrome of left shoulder   2. Adhesive capsulitis of left shoulder   3. S/P arthroscopy of left shoulder   4. Chronic left shoulder pain    Large Joint Inj: L glenohumeral on 12/05/2022 10:11 AM Indications: pain Details: 22 G 3.5 in needle, ultrasound-guided posterior approach Medications: 2 mL lidocaine 1 %; 2 mL bupivacaine 0.25 %; 80 mg methylPREDNISolone acetate 40 MG/ML Outcome: tolerated well, no immediate complications  US-guided glenohumeral joint injection, left shoulder After discussion on risks/benefits/indications, informed verbal consent was obtained. A timeout was then performed. The patient was positioned lying lateral recumbent on examination table. The patient's shoulder was prepped with betadine and multiple alcohol swabs and utilizing ultrasound guidance, the patient's glenohumeral joint was identified on ultrasound. Using ultrasound guidance a 22-gauge, 3.5 inch needle with a mixture of 2:2:2 cc's lidocaine:bupivicaine:depomedrol was directed from a lateral to medial direction via in-plane technique into the glenohumeral joint with visualization of appropriate spread of injectate into the joint. Patient tolerated the procedure well without immediate complications.      Procedure, treatment alternatives, risks and benefits explained, specific risks discussed. Consent was given by the patient. Immediately prior to procedure a time out was called to verify the correct patient, procedure, equipment, support staff and site/side marked as required. Patient was prepped and draped in the usual sterile fashion.     - I evaluated the patient about 10 minutes post-injection and he had some improvement in pain and range of motion - follow-up with Dr. Roda Shutters as indicated; I am happy to see them  as needed  Madelyn Brunner, DO Primary Care Sports Medicine Physician  [facility] - Orthopedics  This note was dictated using Dragon naturally speaking software and may contain errors in syntax, spelling, or content which have not been identified prior to signing this note.

## 2022-12-26 ENCOUNTER — Ambulatory Visit (INDEPENDENT_AMBULATORY_CARE_PROVIDER_SITE_OTHER): Payer: Medicaid Other | Admitting: Orthopaedic Surgery

## 2022-12-26 DIAGNOSIS — M25512 Pain in left shoulder: Secondary | ICD-10-CM | POA: Diagnosis not present

## 2022-12-26 DIAGNOSIS — G8929 Other chronic pain: Secondary | ICD-10-CM | POA: Diagnosis not present

## 2022-12-26 NOTE — Progress Notes (Signed)
Office Visit Note   Patient: Aaron Gibson           Date of Birth: 1966-01-29           MRN: [id number] Visit Date: 12/26/2022              Requested by: Mackie Pai, PA-C [place],  [place] [address] PCP: Mackie Pai, PA-C   Assessment & Plan: Visit Diagnoses:  1. Chronic left shoulder pain     Plan: At this point, I believe his issue is more of strength at this point.  I do not think repeat cortisone injection would be beneficial at this time.  I would like to make a new referral to outpatient physical therapy for which she is agreeable to.  He will follow-up with [place] as needed.  Follow-Up Instructions: Return if symptoms worsen or fail to improve.   Orders:  Orders Placed This Encounter  Procedures   Ambulatory referral to Physical Therapy   No orders of the defined types were placed in this encounter.     Procedures: No procedures performed   Clinical Data: No additional findings.   Subjective: Chief Complaint  Patient presents with   Left Shoulder - Routine Post Op    HPI Patient is a pleasant 57 year old gentleman who comes in today for follow-up of his left shoulder he is approximately 5 months status post left shoulder arthroscopic debridement and manipulation under anesthesia, date of surgery 08/01/2022.  He underwent cortisone injection over a month ago for recurrent stiffness which did help significantly with the pain.  He has not been in outpatient physical therapy since the first week of December due to insurance constraints.  He has however been working on a home exercise program.   Review of Systems   Objective: Vital Signs: There were no vitals taken for this visit.  Physical Exam  Ortho Exam Examination of his left shoulder reveals active forward flexion to approximately 90 degrees.  I can passively get him to approximately 165 degrees.  He has internal rotation to L5.  He is neurovascular intact distally.    Specialty Comments:  No specialty comments available.  Imaging: No results found.   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  Past Surgical History:  Procedure Laterality Date   BUBBLE STUDY  01/02/2021   Procedure: BUBBLE STUDY;  Surgeon: Freada Bergeron, MD;  Location: [facility] ENDOSCOPY;  Service: Cardiovascular;;   SHOULDER ARTHROSCOPY WITH SUBACROMIAL DECOMPRESSION, ROTATOR CUFF REPAIR AND BICEP TENDON REPAIR Left 08/01/2022   Procedure: LEFT SHOULDER SCOPE, EXTENSIVE DEBRIDEMENT, SUBACROMIAL DECOMPRESSION, BICEPS TENODESIS.;  Surgeon: Leandrew Koyanagi, MD;  Location: [place];  Service: Orthopedics;  Laterality: Left;   TEE WITHOUT CARDIOVERSION N/A 01/02/2021   Procedure: TRANSESOPHAGEAL ECHOCARDIOGRAM (TEE);  Surgeon: Freada Bergeron, MD;  Location: [place];  Service: Cardiovascular;  Laterality: N/A;   TONSILLECTOMY     UMBILICAL HERNIA REPAIR N/A 03/15/2015   Procedure: UMBILICAL HERNIA REPAIR WITH MESH;  Surgeon: Coralie Keens, MD;  Location: [place];  Service: General;  Laterality: N/A;   Social History   Occupational History   Occupation: Unemployed   Tobacco Use    Smoking status: Every Day    Packs/day: 0.50    Years: 29.00    Total pack years: 14.50    Types: Cigarettes   Smokeless tobacco: Never   Tobacco comments:    quit 12/28/2020  Vaping Use   Vaping Use: Never used  Substance and Sexual Activity   Alcohol use: Yes    Comment: occasionally holidays   Drug use: No   Sexual activity: Yes

## 2023-04-12 ENCOUNTER — Telehealth (INDEPENDENT_AMBULATORY_CARE_PROVIDER_SITE_OTHER): Payer: Medicaid Other | Admitting: Medical

## 2023-04-12 DIAGNOSIS — G8929 Other chronic pain: Secondary | ICD-10-CM | POA: Diagnosis not present

## 2023-04-12 DIAGNOSIS — I10 Essential (primary) hypertension: Secondary | ICD-10-CM

## 2023-04-12 DIAGNOSIS — M25512 Pain in left shoulder: Secondary | ICD-10-CM

## 2023-04-12 NOTE — Patient Instructions (Addendum)
1. Chronic left shoulder pain Referral to [facility] placed. If they don't call you in one week then advise you call them. Can use tylenol for pain.  Explained that adding low-dose ibuprofen is also an option but not to do that without checking blood pressure before taking ibuprofen.  Explained if blood pressure over 140/90 not to take ibuprofen.  If less than 140/90 then could use ibuprofen 200 mg along with the Tylenol.  Patient expressed understanding. - Ambulatory referral to Orthopedic Surgery  2. Hypertension, unspecified type Explained to patient to use amlodipine and losartan daily.  Blood pressure and make sure readings less than 140/90.  Patient notes he does have prescription of both losartan and amlodipine.  Explained importance of follow-up to check metabolic panel, lipid panel and A1c.  Spent more than a year since I last saw patient in office.  Follow-up with orthopedist as planned but also in our office within 1 month.

## 2023-04-12 NOTE — Progress Notes (Signed)
Subjective:    Patient ID: Aaron Gibson, male    DOB: Aug 11, 1966, 57 y.o.   MRN: [id number]  HPI Virtual Visit via Video Note  I connected with Aaron Gibson on 04/12/23 at  2:20 PM EDT by a video enabled telemedicine application and verified that I am speaking with the correct person using two identifiers.  Location: Patient: home Provider: office   I discussed the limitations of evaluation and management by telemedicine and the availability of in person appointments. The patient expressed understanding and agreed to proceed.  History of Present Illness: Pt has left shoulder pain. Pain with movement and limited range of motion. 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 Pt states he had tendon repair and then PT. His range of motion never got back to normal and still has pain on attempted range of motion.  Dr. Roda Shutters did surgery and not pt wants to see [facility] for second opinion.    On review 08/01/2022.  S/P arthroscopy of left shoulder       Plan: Aaron Gibson returns today 2 weeks status post left shoulder scope with biceps tenodesis.  Has not started PT yet because he has social issues.  Would like a refill on his pain medications.   Examination of left shoulder shows expected passive range of motion with some mild discomfort.  No significant guarding or limitation to range of motion.  Surgical incision is healed.  Sutures were removed.   Patient will call to make an appointment for outpatient PT.  Percocet refilled to be used mainly at nighttime.  Recheck in 4 weeks.  Sling is to be worn in public.  Limit lifting to 5 pounds with the operative extremity.  For pain only takes tylenol and low dose advil.    Htn- last time pt checked. 148/70 2 weeks ago. Pt was on amlodpine and losartan. He states taking med about every 3rd day. He states only takes if feels light headed. He states takes rarely due to cost. He states has not returned to work.   Past Medical  History:  Diagnosis Date   Hypertension    untreated   Morbid obesity (HCC)    Pre-diabetes    per patient report   Stroke [facility])    2021- left sided weakness per patient report     Social History   Socioeconomic History   Marital status: Single    Spouse name: Not on file   Number of children: Not on file   Years of education: Not on file   Highest education level: Not on file  Occupational History   Occupation: Unemployed   Tobacco Use   Smoking status: Every Day    Packs/day: 0.50    Years: 29.00    Additional pack years: 0.00    Total pack years: 14.50    Types: Cigarettes   Smokeless tobacco: Never   Tobacco comments:    quit 12/28/2020  Vaping Use   Vaping Use: Never used  Substance and Sexual Activity   Alcohol use: Yes    Comment: occasionally holidays   Drug use: No   Sexual activity: Yes  Other Topics Concern   Not on file  Social History Narrative   Lives with daughter   Right Handed   Caffeine 2x week   Social Determinants of Health   Financial Resource Strain: Not on file  Food Insecurity: Not on file  Transportation Needs: Not on file  Physical Activity: Not on file  Stress: Not on file  Social Connections: Not on file  Intimate Partner Violence: Not on file    Past Surgical History:  Procedure Laterality Date   BUBBLE STUDY  01/02/2021   Procedure: BUBBLE STUDY;  Surgeon: Meriam Sprague, MD;  Location: [facility] ENDOSCOPY;  Service: Cardiovascular;;   SHOULDER ARTHROSCOPY WITH SUBACROMIAL DECOMPRESSION, ROTATOR CUFF REPAIR AND BICEP TENDON REPAIR Left 08/01/2022   Procedure: LEFT SHOULDER SCOPE, EXTENSIVE DEBRIDEMENT, SUBACROMIAL DECOMPRESSION, BICEPS TENODESIS.;  Surgeon: Tarry Kos, MD;  Location: [facility] OR;  Service: Orthopedics;  Laterality: Left;   TEE WITHOUT CARDIOVERSION N/A 01/02/2021   Procedure: TRANSESOPHAGEAL ECHOCARDIOGRAM (TEE);  Surgeon: Meriam Sprague, MD;  Location: [facility] ENDOSCOPY;  Service: Cardiovascular;  Laterality: N/A;    TONSILLECTOMY     UMBILICAL HERNIA REPAIR N/A 03/15/2015   Procedure: UMBILICAL HERNIA REPAIR WITH MESH;  Surgeon: Abigail Miyamoto, MD;  Location: [facility] OR;  Service: General;  Laterality: N/A;    No family history on file.  No Known Allergies  Current Outpatient Medications on File Prior to Visit  Medication Sig Dispense Refill   Accu-Chek Softclix Lancets lancets Use to check Blood sugars once a day. Dx E11.8 100 each 1   acetaminophen (TYLENOL) 325 MG tablet Take 2 tablets (650 mg total) by mouth every 4 (four) hours as needed for mild pain (or temp > 37.5 C (99.5 F)).     albuterol (VENTOLIN HFA) 108 (90 Base) MCG/ACT inhaler Inhale 2 puffs into the lungs every 6 (six) hours as needed. 18 g 0   amLODipine (NORVASC) 10 MG tablet TAKE 1 TABLET(10 MG) BY MOUTH DAILY 90 tablet 0   aspirin 81 MG chewable tablet Chew 1 tablet (81 mg total) by mouth daily.     atorvastatin (LIPITOR) 80 MG tablet TAKE 1 TABLET(80 MG) BY MOUTH DAILY 30 tablet 5   Blood Glucose Monitoring Suppl (ACCU-CHEK GUIDE ME) w/Device KIT Use to check Blood sugars once a day. Dx E11.8 1 kit 0   buPROPion (WELLBUTRIN XL) 150 MG 24 hr tablet Take 1 tablet (150 mg total) by mouth daily. 30 tablet 3   cyclobenzaprine (FLEXERIL) 5 MG tablet Take 1-2 tablets (5-10 mg total) by mouth 3 (three) times daily as needed for muscle spasms. 30 tablet 0   glucose blood (ACCU-CHEK GUIDE) test strip Use to check Blood sugars once a day. Dx E11.8 100 each 1   HYDROcodone-acetaminophen (NORCO) 5-325 MG tablet Take 1 tablet by mouth 2 (two) times daily as needed. 30 tablet 0   losartan (COZAAR) 50 MG tablet Take 1 tablet (50 mg total) by mouth daily. 90 tablet 1   metFORMIN (GLUCOPHAGE) 500 MG tablet Take 1 tablet (500 mg total) by mouth 2 (two) times daily with a meal. (Patient taking differently: Take 500 mg by mouth daily with breakfast.) 60 tablet 3   Multiple Vitamins-Minerals (MULTIVITAMIN WITH MINERALS) tablet Take 1 tablet by mouth  daily.     ondansetron (ZOFRAN) 4 MG tablet Take 1-2 tablets (4-8 mg total) by mouth every 8 (eight) hours as needed for nausea or vomiting. 20 tablet 0   oxyCODONE-acetaminophen (PERCOCET) 5-325 MG tablet Take 1-2 tablets by mouth at bedtime as needed for severe pain. 20 tablet 0   No current facility-administered medications on file prior to visit.    There were no vitals taken for this visit.    Observations/Objective:   General-no acute distress, pleasant, oriented. Lungs- on inspection lungs appear unlabored. Neck- no tracheal deviation or jvd on inspection. Neuro-  gross motor function appears intact.  Left upper extremity-limited abduction.  Cannot lift arm above shoulder level. Assessment and Plan: Patient Instructions  1. Chronic left shoulder pain Referral to [facility] placed. If they don't call you in one week then advise you call them. Can use tylenol for pain.  Explained that adding low-dose ibuprofen is also an option but not to do that without checking blood pressure before taking ibuprofen.  Explained if blood pressure over 140/90 not to take ibuprofen.  If less than 140/90 then could use ibuprofen 200 mg along with the Tylenol.  Patient expressed understanding. - Ambulatory referral to Orthopedic Surgery  2. Hypertension, unspecified type Explained to patient to use amlodipine and losartan daily.  Blood pressure and make sure readings less than 140/90.  Patient notes he does have prescription of both losartan and amlodipine.  Explained importance of follow-up to check metabolic panel, lipid panel and A1c.  Spent more than a year since I last saw patient in office.  Follow-up with orthopedist as planned but also in our office within 1 month.      Esperanza Richters, PA-C   Follow Up Instructions:    I discussed the assessment and treatment plan with the patient. The patient was provided an opportunity to ask questions and all were answered. The patient agreed with  the plan and demonstrated an understanding of the instructions.   The patient was advised to call back or seek an in-person evaluation if the symptoms worsen or if the condition fails to improve as anticipated.     Esperanza Richters, PA-C    Review of Systems  Constitutional:  Negative for chills and fever.  Respiratory:  Negative for cough, chest tightness, shortness of breath and wheezing.   Cardiovascular:  Negative for chest pain and palpitations.  Gastrointestinal:  Negative for abdominal pain.  Neurological:  Negative for dizziness, seizures, weakness and headaches.  Hematological:  Negative for adenopathy. Does not bruise/bleed easily.       Objective:   Physical Exam        Assessment & Plan:

## 2023-04-18 ENCOUNTER — Ambulatory Visit: Payer: Medicaid Other | Admitting: Orthopaedic Surgery

## 2023-05-14 ENCOUNTER — Encounter: Payer: Self-pay | Admitting: Medical

## 2023-05-23 ENCOUNTER — Telehealth: Payer: Self-pay | Admitting: Medical

## 2023-05-23 NOTE — Telephone Encounter (Signed)
Pt called asking if Aaron Gibson himself could give him a call back regarding his referral to [facility]. When asked if there was any additional information that could be sent back as to the nature of the call, pt declined to provide info. After noticing that MyChart was active, asked if pt had attempted to send a message that way. Pt stated that he was having issues and thought it would be easier to just call. Advised pt a message would be sent back to look into this.

## 2023-05-24 NOTE — Telephone Encounter (Signed)
noted 

## 2023-06-11 DIAGNOSIS — M25512 Pain in left shoulder: Secondary | ICD-10-CM | POA: Diagnosis not present

## 2023-06-24 DIAGNOSIS — M25512 Pain in left shoulder: Secondary | ICD-10-CM | POA: Diagnosis not present

## 2023-07-02 DIAGNOSIS — M25512 Pain in left shoulder: Secondary | ICD-10-CM | POA: Diagnosis not present

## 2023-07-19 ENCOUNTER — Ambulatory Visit: Payer: Medicaid Other | Admitting: Medical

## 2023-07-19 VITALS — BP 140/74 | HR 70 | Resp 18 | Ht 67.0 in | Wt 216.0 lb

## 2023-07-19 DIAGNOSIS — G8929 Other chronic pain: Secondary | ICD-10-CM

## 2023-07-19 DIAGNOSIS — Z7984 Long term (current) use of oral hypoglycemic drugs: Secondary | ICD-10-CM | POA: Diagnosis not present

## 2023-07-19 DIAGNOSIS — M25512 Pain in left shoulder: Secondary | ICD-10-CM | POA: Diagnosis not present

## 2023-07-19 DIAGNOSIS — E118 Type 2 diabetes mellitus with unspecified complications: Secondary | ICD-10-CM | POA: Diagnosis not present

## 2023-07-19 DIAGNOSIS — I10 Essential (primary) hypertension: Secondary | ICD-10-CM

## 2023-07-19 LAB — HEMOGLOBIN A1C: Hgb A1c MFr Bld: 6 % (ref 4.6–6.5)

## 2023-07-19 LAB — COMPREHENSIVE METABOLIC PANEL
ALT: 30 U/L (ref 0–53)
AST: 20 U/L (ref 0–37)
Albumin: 3.9 g/dL (ref 3.5–5.2)
Alkaline Phosphatase: 92 U/L (ref 39–117)
BUN: 10 mg/dL (ref 6–23)
CO2: 29 mEq/L (ref 19–32)
Calcium: 9.2 mg/dL (ref 8.4–10.5)
Chloride: 105 mEq/L (ref 96–112)
Creatinine, Ser: 1 mg/dL (ref 0.40–1.50)
GFR: 85.13 mL/min (ref >60.00)
Glucose, Bld: 107 mg/dL — ABNORMAL HIGH (ref 70–99)
Potassium: 4.2 mEq/L (ref 3.5–5.1)
Sodium: 142 mEq/L (ref 135–145)
Total Bilirubin: 0.6 mg/dL (ref 0.2–1.2)
Total Protein: 6.4 g/dL (ref 6.0–8.3)

## 2023-07-19 MED ORDER — TRAMADOL HCL 50 MG PO TABS: 50.0000 mg | ORAL_TABLET | Freq: Four times a day (QID) | ORAL | 0 refills | Status: AC | PRN

## 2023-07-19 NOTE — Progress Notes (Signed)
Subjective:    Patient ID: Aaron Gibson, male    DOB: [birth year], 57 y.o.   MRN: [id number]  HPI  Pt has been to orthopedist for left shoulder pain since prior surgery post his stroke years ago. Pt surgery done by Dr .Roda Shutters. Pt states he has gone thru physical therapy. His pain is persisting and his shoulder feels week.  Pt states  his pain level is moderate pain during day but more severe pain at night when he rolls in bed.  Previously he had been on narcotic type meds.   Pt had concern since he thought ortho explained that he likley won't be able to  lift left arm above his head. Pt desires to regain ability to lift arm above his head.   Pt has hx of elevated A1c in past. Surgeon wants to know update A1c in event surgery is decided.     Ongoing left shoulder pain and functional loss after injury and subsequent surgery. I would recommend a repeat MRI scan to assess the current situation in the shoulder and to include the integrity of his rotator cuff repair, the presence of any impinging lesions and/or scar tissue and/or frozen shoulder/capsular tightness. Patient understands and agrees. Follow-up after MRI. Modify activities in the meantime. Patient is a diabetic. I would recommend that he check his hemoglobin A1c so he knows that number at the next appointment. [email or web address] Not available 06/11/2023 23:22:48  07/02/2023 07/02/2023 Imaging: MRI scan reviewed with the patient showing high-grade partial versus full-thickness rotator cuff tears of the supraspinatus and infraspinatus with some muscle atrophy noted in those 2 muscles. The other muscles of the shoulder girdle look normal. Notably the subscapularis and teres minor are intact with normal-appearing muscles. Articular cartilage shows mild to moderate OA with no subchondral edema. No evidence of severe superior head migration.   Left shoulder pain due to multi tendon rotator cuff tear. The patient's range of motion and function is  reasonable fortunately given to pretty good balancing between the subscapularis and teres minor and the back. I do not believe that his supraspinatus infraspinatus tendons are repairable. The tendon quality looks poor and he has advanced muscle atrophy in those 2 muscles. I recommended conservative management as long as possible with the shoulder. Should his function become suddenly poor or pain become unbearable or unmanageable, I would recommend a reverse total shoulder arthroplasty for him. Patient agrees with conservative plan at this time.       Review of Systems See hpi.      Objective:   Physical Exam  General Mental Status- Alert. General Appearance- Not in acute distress.   Skin General: Color- Normal Color. Moisture- Normal Moisture.  Neck Carotid Arteries- Normal color. Moisture- Normal Moisture. No carotid bruits. No JVD.  Chest and Lung Exam Auscultation: Breath Sounds:-Normal.  Cardiovascular Auscultation:Rythm- Regular. Murmurs & Other Heart Sounds:Auscultation of the heart reveals- No Murmurs.  Abdomen Inspection:-Inspeection Normal. Palpation/Percussion:Note:No mass. Palpation and Percussion of the abdomen reveal- Non Tender, Non Distended + BS, no rebound or guarding.  Neurologic Cranial Nerve exam:- CN III-XII intact(No nystagmus), symmetric smile. tStrength:- 5/5 equal and symmetric strength both upper and lower extremities.    Left shoulder- Restricted range of motion. Can't lift arm above shoulder level.      Assessment & Plan:   Chronic left shoulder pain and reduced range of motion.  On review of ortho note states below. Left shoulder pain due to multi tendon rotator cuff tear.   [facility] opinion (  I do not believe that his supraspinatus infraspinatus tendons are repairable. The tendon quality looks poor and he has advanced muscle atrophy in those 2 muscles. I recommended conservative management as long as possible with the shoulder. Should  his function become suddenly poor or pain become unbearable or unmanageable, I would recommend a reverse total shoulder arthroplasty for him. Patient agrees with conservative plan at this point)  Get Dr Roda Shutters opinion regarding the above.  Offer tramadol to use for severe pain. Limited rx today. 140/90.   Diabetes- check A1c and cmp. Pt on metformin presently.  Htn- bp better on recheck. Continue amlodipine 10 mg daily and losartan 50 mg daily.   Follow up date to be determined after lab review.  Esperanza Richters, PA-C

## 2023-07-19 NOTE — Patient Instructions (Addendum)
Chronic left shoulder pain and reduced range of motion.  On review of ortho note states below. Left shoulder pain due to multi tendon rotator cuff tear.   [facility] opinion (I do not believe that his supraspinatus infraspinatus tendons are repairable. The tendon quality looks poor and he has advanced muscle atrophy in those 2 muscles. I recommended conservative management as long as possible with the shoulder. Should his function become suddenly poor or pain become unbearable or unmanageable, I would recommend a reverse total shoulder arthroplasty for him. Patient agrees with conservative plan at this point)  Get Dr Roda Shutters opinion regarding the above.  Offer tramadol to use for severe pain. Limited rx today. 140/90.   Diabetes- check A1c and cmp. Pt on metformin presently.  Htn- bp better on recheck. Continue amlodipine 10 mg daily and losartan 50 mg daily.   Follow up date to be determined after lab review.

## 2023-08-25 ENCOUNTER — Other Ambulatory Visit: Payer: Self-pay

## 2023-08-25 ENCOUNTER — Emergency Department (HOSPITAL_BASED_OUTPATIENT_CLINIC_OR_DEPARTMENT_OTHER)
Admission: EM | Admit: 2023-08-25 | Discharge: 2023-08-25 | Discharge status: Against Medical Advice | Payer: Medicaid Other | Source: Home / Self Care

## 2023-08-25 ENCOUNTER — Encounter (HOSPITAL_BASED_OUTPATIENT_CLINIC_OR_DEPARTMENT_OTHER): Payer: Self-pay

## 2023-08-25 DIAGNOSIS — Z5321 Procedure and treatment not carried out due to patient leaving prior to being seen by health care provider: Secondary | ICD-10-CM | POA: Insufficient documentation

## 2023-08-25 DIAGNOSIS — M545 Low back pain, unspecified: Secondary | ICD-10-CM | POA: Diagnosis not present

## 2023-08-25 NOTE — ED Triage Notes (Signed)
Pt to ED c/o lower back pain x 4 days radiating down right leg. Ambulatory in triage. Denies injury. Denies hx sciatica.

## 2023-09-30 ENCOUNTER — Encounter: Payer: Self-pay | Admitting: Medical

## 2023-10-01 ENCOUNTER — Ambulatory Visit: Payer: Medicaid Other | Admitting: Medical

## 2023-10-01 ENCOUNTER — Ambulatory Visit (HOSPITAL_BASED_OUTPATIENT_CLINIC_OR_DEPARTMENT_OTHER)
Admission: RE | Admit: 2023-10-01 | Discharge: 2023-10-01 | Disposition: A | Discharge status: Home / Self Care | Payer: Medicaid Other | Source: Ambulatory Visit | Attending: Medical | Admitting: Medical

## 2023-10-01 ENCOUNTER — Encounter: Payer: Self-pay | Admitting: Medical

## 2023-10-01 VITALS — BP 158/95 | HR 78 | Temp 98.0°F | Resp 18 | Ht 68.0 in | Wt 215.6 lb

## 2023-10-01 DIAGNOSIS — M25512 Pain in left shoulder: Secondary | ICD-10-CM | POA: Insufficient documentation

## 2023-10-01 DIAGNOSIS — M7502 Adhesive capsulitis of left shoulder: Secondary | ICD-10-CM

## 2023-10-01 DIAGNOSIS — G8929 Other chronic pain: Secondary | ICD-10-CM | POA: Insufficient documentation

## 2023-10-01 DIAGNOSIS — M778 Other enthesopathies, not elsewhere classified: Secondary | ICD-10-CM | POA: Diagnosis not present

## 2023-10-01 DIAGNOSIS — I10 Essential (primary) hypertension: Secondary | ICD-10-CM | POA: Diagnosis not present

## 2023-10-01 DIAGNOSIS — M19012 Primary osteoarthritis, left shoulder: Secondary | ICD-10-CM | POA: Diagnosis not present

## 2023-10-01 MED ORDER — HYDROCODONE-ACETAMINOPHEN 5-325 MG PO TABS: ORAL_TABLET | ORAL | 0 refills | Status: DC

## 2023-10-01 MED ORDER — LOSARTAN POTASSIUM 100 MG PO TABS: 100.0000 mg | ORAL_TABLET | Freq: Every day | ORAL | 3 refills | Status: AC

## 2023-10-01 NOTE — Patient Instructions (Signed)
1. Hypertension, unspecified type Bp is elevated today and you report 146-152 systolics when you checked. High today. Increase your losartan to 100 mg daily and continue amlodipine 10 mg  2. Chronic left shoulder pain Norco 5/325 mg #8 1 tab every 6 hours prn pain. See response to pain med. May need to refer you to pain management. Will review xray. Want to see joint space as [facility] recommends shoulder replacement - DG Shoulder Left; Future  Ask you fill out appropiate paperwork so you can get records that you desire. Or get number to medical records.  Follow up in 10-14 days or sooner if needed

## 2023-10-01 NOTE — Progress Notes (Signed)
Subjective:    Patient ID: Aaron Gibson, male    DOB: 1966/04/22, 57 y.o.   MRN: [id number]  HPI  Pt  in for follow up for left shoulder pain.   Pt states pain and reduced range of motion since stroke January 2022.   Pt had been to OT, PT and followed then eventually saw Dr. Roda Shutters.  Pt went to Dr. Roda Shutters in the past and had surgery. Pt got second opinion and other surgeon states he needs shoulder replacement.   Last Dr Roda Shutters appointment.    Assessment & Plan: Visit Diagnoses:  1. Chronic left shoulder pain       Plan: At this point, I believe his issue is more of strength at this point.  I do not think repeat cortisone injection would be beneficial at this time.  I would like to make a new referral to outpatient physical therapy for which she is agreeable to.  He will follow-up with [place] as needed.   Follow-Up Instructions: Return if symptoms worsen or fail to improve.    Pt had used tramadol in the past and he states it did not help. Pt in the past norco 5-325 mg as well.   Pt states with movement of his left arm. Pain will be 8-9/10  Pt describes that most recent orthopedist described severe degenerative changes.   I don't see the [facility] note.    Review of Systems  Constitutional:  Negative for chills, fatigue and fever.  Respiratory:  Negative for chest tightness, shortness of breath and stridor.   Cardiovascular:  Negative for chest pain and palpitations.  Gastrointestinal:  Negative for abdominal pain.  Genitourinary:  Negative for dysuria and frequency.  Musculoskeletal:        Left shoulder pain.  Skin:  Negative for rash.  Neurological:  Negative for dizziness, speech difficulty, numbness and headaches.  Hematological:  Negative for adenopathy. Does not bruise/bleed easily.  Psychiatric/Behavioral:  Negative for behavioral problems and confusion.       Past Medical History:  Diagnosis Date   Hypertension    untreated   Morbid obesity (HCC)     Pre-diabetes    per patient report   Stroke [facility])    2021- left sided weakness per patient report     Social History   Socioeconomic History   Marital status: Single    Spouse name: Not on file   Number of children: Not on file   Years of education: Not on file   Highest education level: Associate degree: occupational, Scientist, product/process development, or vocational program  Occupational History   Occupation: Unemployed   Tobacco Use   Smoking status: Every Day    Current packs/day: 0.50    Average packs/day: 0.5 packs/day for 29.0 years (14.5 ttl pk-yrs)    Types: Cigarettes   Smokeless tobacco: Never   Tobacco comments:    quit 12/28/2020  Vaping Use   Vaping status: Never Used  Substance and Sexual Activity   Alcohol use: Yes    Comment: occasionally holidays   Drug use: No   Sexual activity: Yes  Other Topics Concern   Not on file  Social History Narrative   Lives with daughter   Right Handed   Caffeine 2x week   Social Determinants of Health   Financial Resource Strain: High Risk (07/19/2023)   Overall Financial Resource Strain (CARDIA)    Difficulty of Paying Living Expenses: Very hard  Food Insecurity: Food Insecurity Present (07/19/2023)   Hunger Vital  Sign    Worried About Programme researcher, broadcasting/film/video in the Last Year: Sometimes true    Ran Out of Food in the Last Year: Sometimes true  Transportation Needs: Unmet Transportation Needs (07/19/2023)   PRAPARE - Administrator, Civil Service (Medical): Yes    Lack of Transportation (Non-Medical): Yes  Physical Activity: Insufficiently Active (07/19/2023)   Exercise Vital Sign    Days of Exercise per Week: 1 day    Minutes of Exercise per Session: 30 min  Stress: No Stress Concern Present (07/19/2023)   [employer] of Occupational Health - Occupational Stress Questionnaire    Feeling of Stress : Not at all  Social Connections: Unknown (07/19/2023)   Social Connection and Isolation Panel [NHANES]    Frequency of Communication  with Friends and Family: Patient declined    Frequency of Social Gatherings with Friends and Family: Once a week    Attends Religious Services: Patient declined    Database administrator or Organizations: No    Attends Engineer, structural: Not on file    Marital Status: Never married  Intimate Partner Violence: Not on file    Past Surgical History:  Procedure Laterality Date   BUBBLE STUDY  01/02/2021   Procedure: BUBBLE STUDY;  Surgeon: Meriam Sprague, MD;  Location: [facility] ENDOSCOPY;  Service: Cardiovascular;;   SHOULDER ARTHROSCOPY WITH SUBACROMIAL DECOMPRESSION, ROTATOR CUFF REPAIR AND BICEP TENDON REPAIR Left 08/01/2022   Procedure: LEFT SHOULDER SCOPE, EXTENSIVE DEBRIDEMENT, SUBACROMIAL DECOMPRESSION, BICEPS TENODESIS.;  Surgeon: Tarry Kos, MD;  Location: [facility] OR;  Service: Orthopedics;  Laterality: Left;   TEE WITHOUT CARDIOVERSION N/A 01/02/2021   Procedure: TRANSESOPHAGEAL ECHOCARDIOGRAM (TEE);  Surgeon: Meriam Sprague, MD;  Location: [facility] ENDOSCOPY;  Service: Cardiovascular;  Laterality: N/A;   TONSILLECTOMY     UMBILICAL HERNIA REPAIR N/A 03/15/2015   Procedure: UMBILICAL HERNIA REPAIR WITH MESH;  Surgeon: Abigail Miyamoto, MD;  Location: [facility] OR;  Service: General;  Laterality: N/A;    No family history on file.  No Known Allergies  Current Outpatient Medications on File Prior to Visit  Medication Sig Dispense Refill   Accu-Chek Softclix Lancets lancets Use to check Blood sugars once a day. Dx E11.8 100 each 1   acetaminophen (TYLENOL) 325 MG tablet Take 2 tablets (650 mg total) by mouth every 4 (four) hours as needed for mild pain (or temp > 37.5 C (99.5 F)).     albuterol (VENTOLIN HFA) 108 (90 Base) MCG/ACT inhaler Inhale 2 puffs into the lungs every 6 (six) hours as needed. 18 g 0   amLODipine (NORVASC) 10 MG tablet TAKE 1 TABLET(10 MG) BY MOUTH DAILY 90 tablet 0   aspirin 81 MG chewable tablet Chew 1 tablet (81 mg total) by mouth daily.     atorvastatin  (LIPITOR) 80 MG tablet TAKE 1 TABLET(80 MG) BY MOUTH DAILY 30 tablet 5   Blood Glucose Monitoring Suppl (ACCU-CHEK GUIDE ME) w/Device KIT Use to check Blood sugars once a day. Dx E11.8 1 kit 0   buPROPion (WELLBUTRIN XL) 150 MG 24 hr tablet Take 1 tablet (150 mg total) by mouth daily. 30 tablet 3   cyclobenzaprine (FLEXERIL) 5 MG tablet Take 1-2 tablets (5-10 mg total) by mouth 3 (three) times daily as needed for muscle spasms. 30 tablet 0   glucose blood (ACCU-CHEK GUIDE) test strip Use to check Blood sugars once a day. Dx E11.8 100 each 1   metFORMIN (GLUCOPHAGE) 500 MG tablet Take 1  tablet (500 mg total) by mouth 2 (two) times daily with a meal. (Patient taking differently: Take 500 mg by mouth daily with breakfast.) 60 tablet 3   Multiple Vitamins-Minerals (MULTIVITAMIN WITH MINERALS) tablet Take 1 tablet by mouth daily.     ondansetron (ZOFRAN) 4 MG tablet Take 1-2 tablets (4-8 mg total) by mouth every 8 (eight) hours as needed for nausea or vomiting. 20 tablet 0   No current facility-administered medications on file prior to visit.    BP (!) 158/95   Pulse 78   Temp 98 F (36.7 C)   Resp 18   Ht 5\' 8"  (1.727 m)   Wt 215 lb 9.6 oz (97.8 kg)   SpO2 98%   BMI 32.78 kg/m                Objective:   Physical Exam  General- No acute distress. Pleasant patient. Neck- Full range of motion, no jvd Lungs- Clear, even and unlabored. Heart- regular rate and rhythm. Neurologic- CNII- XII grossly intact.  Left shoulder- 4-5 anterior shoulder throb at rest. If starts to move shoulder pain 8-9/10     Assessment & Plan:   Patient Instructions  1. Hypertension, unspecified type Bp is elevated today and you report 146-152 systolics when you checked. High today. Increase your losartan to 100 mg daily and continue amlodipine 10 mg  2. Chronic left shoulder pain Norco 5/325 mg #8 1 tab every 6 hours prn pain. See response to pain med. May need to refer you to pain management. Will  review xray. Want to see joint space as [facility] recommends shoulder replacement - DG Shoulder Left; Future  Ask you fill out appropiate paperwork so you can get records that you desire. Or get number to medical records.  Follow up in 10-14 days or sooner if needed     [employer], PA-C

## 2023-11-05 ENCOUNTER — Ambulatory Visit: Payer: Medicaid Other | Admitting: Medical

## 2023-11-05 ENCOUNTER — Other Ambulatory Visit: Payer: Self-pay | Admitting: Medical

## 2023-11-05 VITALS — BP 162/80 | HR 72 | Ht 68.0 in | Wt 216.0 lb

## 2023-11-05 DIAGNOSIS — Z125 Encounter for screening for malignant neoplasm of prostate: Secondary | ICD-10-CM

## 2023-11-05 DIAGNOSIS — G8929 Other chronic pain: Secondary | ICD-10-CM | POA: Diagnosis not present

## 2023-11-05 DIAGNOSIS — M25512 Pain in left shoulder: Secondary | ICD-10-CM | POA: Diagnosis not present

## 2023-11-05 DIAGNOSIS — I10 Essential (primary) hypertension: Secondary | ICD-10-CM | POA: Diagnosis not present

## 2023-11-05 DIAGNOSIS — Z79899 Other long term (current) drug therapy: Secondary | ICD-10-CM | POA: Diagnosis not present

## 2023-11-05 DIAGNOSIS — R6882 Decreased libido: Secondary | ICD-10-CM | POA: Diagnosis not present

## 2023-11-05 DIAGNOSIS — R739 Hyperglycemia, unspecified: Secondary | ICD-10-CM | POA: Diagnosis not present

## 2023-11-05 DIAGNOSIS — R972 Elevated prostate specific antigen [PSA]: Secondary | ICD-10-CM

## 2023-11-05 DIAGNOSIS — R5383 Other fatigue: Secondary | ICD-10-CM

## 2023-11-05 MED ORDER — HYDROCODONE-ACETAMINOPHEN 5-325 MG PO TABS: ORAL_TABLET | ORAL | 0 refills | Status: DC

## 2023-11-05 MED ORDER — HYDROCHLOROTHIAZIDE 12.5 MG PO CAPS: 12.5000 mg | ORAL_CAPSULE | Freq: Every day | ORAL | 0 refills | Status: DC

## 2023-11-05 NOTE — Patient Instructions (Signed)
Chronic Left Shoulder Pain Pain persists despite Norco use. Limited range of motion and muscle atrophy noted. Patient considering surgery but has logistical concerns. -Prescribe 60 tablets of Norco per month with a signed contract and urine drug screen. -Advise patient to avoid strenuous activity that exacerbates pain. -Encourage patient to pursue surgical consultation with Dr. Josefa Half. -controlled med rules explained.  Hypertension Blood pressure elevated at 160/80 despite current regimen of Amlodipine 10mg  and Losartan 100mg . -Add diuretic (HCTZ) to current regimen. -Advise patient to monitor blood pressure at home, especially when taking NSAIDs. -check bp at home and update me on readings in one week.  Possible Low Testosterone Patient reports fatigue, erectile dysfunction, and decreased strength. -Order morning testosterone level. -Order PSA and testosterone panel, given it has been two years since last check.  General Health Maintenance -Order A1C to check three-month sugar average. along with cmp when future lab done. -Check kidney function and potassium level due to addition of diuretic to regimen. -Schedule follow-up in six months for pain management and yearly for drug screen.   Follow up date to be determined after lab review. controlled med visits will be every 6 months.

## 2023-11-05 NOTE — Progress Notes (Signed)
Subjective:    Patient ID: Aaron Gibson, male    DOB: Apr 23, 1966, 57 y.o.   MRN: [id number]  HPI Discussed the use of AI scribe software for clinical note transcription with the patient, who gave verbal consent to proceed.  History of Present Illness   The patient, with a history of chronic left shoulder pain, reports that the previously prescribed Norco was effective in improving sleep but did not significantly improve function or range of motion. The patient took the medication twice daily until it ran out, after which they resorted to over-the-counter pain relief. However, the patient reports that over-the-counter medication has been causing gastrointestinal side effects briefly.  The patient also reports a general feeling of weakness and difficulty with activities such as sitting up from a lying position. They have previously been prescribed strong pain medication post-surgery, including Oxycontin or Hydrocodone, but have since been managing pain with over-the-counter medication.  The patient has also been experiencing symptoms suggestive of low testosterone, including fatigue, erectile dysfunction, and decreased strength.  The patient's blood pressure has been elevated in recent readings, despite taking Amlodipine and Losartan. The patient admits to not taking their medication as prescribed on the day of the consultation. They have previously tried a diuretic for blood pressure control, but the specific medication is not recalled. No cardiac or neurologic signs/symptoms.       Past Medical History:  Diagnosis Date   Hypertension    untreated   Morbid obesity (HCC)    Pre-diabetes    per patient report   Stroke [facility])    2021- left sided weakness per patient report     Social History   Socioeconomic History   Marital status: Single    Spouse name: Not on file   Number of children: Not on file   Years of education: Not on file   Highest education level: Associate degree:  occupational, Scientist, product/process development, or vocational program  Occupational History   Occupation: Unemployed   Tobacco Use   Smoking status: Every Day    Current packs/day: 0.50    Average packs/day: 0.5 packs/day for 29.0 years (14.5 ttl pk-yrs)    Types: Cigarettes   Smokeless tobacco: Never   Tobacco comments:    quit 12/28/2020  Vaping Use   Vaping status: Never Used  Substance and Sexual Activity   Alcohol use: Yes    Comment: occasionally holidays   Drug use: No   Sexual activity: Yes  Other Topics Concern   Not on file  Social History Narrative   Lives with daughter   Right Handed   Caffeine 2x week   Social Determinants of Health   Financial Resource Strain: Low Risk  (11/05/2023)   Overall Financial Resource Strain (CARDIA)    Difficulty of Paying Living Expenses: Not very hard  Food Insecurity: Food Insecurity Present (11/05/2023)   Hunger Vital Sign    Worried About Running Out of Food in the Last Year: Sometimes true    Ran Out of Food in the Last Year: Sometimes true  Transportation Needs: Unmet Transportation Needs (11/05/2023)   PRAPARE - Administrator, Civil Service (Medical): Yes    Lack of Transportation (Non-Medical): Yes  Physical Activity: Insufficiently Active (11/05/2023)   Exercise Vital Sign    Days of Exercise per Week: 2 days    Minutes of Exercise per Session: 30 min  Stress: No Stress Concern Present (11/05/2023)   [employer] of Occupational Health - Occupational Stress Questionnaire  Feeling of Stress : Only a little  Social Connections: Unknown (11/05/2023)   Social Connection and Isolation Panel [NHANES]    Frequency of Communication with Friends and Family: Three times a week    Frequency of Social Gatherings with Friends and Family: Once a week    Attends Religious Services: Patient declined    Database administrator or Organizations: No    Attends Engineer, structural: Not on file    Marital Status: Never married   Intimate Partner Violence: Not on file    Past Surgical History:  Procedure Laterality Date   BUBBLE STUDY  01/02/2021   Procedure: BUBBLE STUDY;  Surgeon: Meriam Sprague, MD;  Location: [facility] ENDOSCOPY;  Service: Cardiovascular;;   SHOULDER ARTHROSCOPY WITH SUBACROMIAL DECOMPRESSION, ROTATOR CUFF REPAIR AND BICEP TENDON REPAIR Left 08/01/2022   Procedure: LEFT SHOULDER SCOPE, EXTENSIVE DEBRIDEMENT, SUBACROMIAL DECOMPRESSION, BICEPS TENODESIS.;  Surgeon: Tarry Kos, MD;  Location: [facility] OR;  Service: Orthopedics;  Laterality: Left;   TEE WITHOUT CARDIOVERSION N/A 01/02/2021   Procedure: TRANSESOPHAGEAL ECHOCARDIOGRAM (TEE);  Surgeon: Meriam Sprague, MD;  Location: [facility] ENDOSCOPY;  Service: Cardiovascular;  Laterality: N/A;   TONSILLECTOMY     UMBILICAL HERNIA REPAIR N/A 03/15/2015   Procedure: UMBILICAL HERNIA REPAIR WITH MESH;  Surgeon: Abigail Miyamoto, MD;  Location: [facility] OR;  Service: General;  Laterality: N/A;    No family history on file.  No Known Allergies  Current Outpatient Medications on File Prior to Visit  Medication Sig Dispense Refill   Accu-Chek Softclix Lancets lancets Use to check Blood sugars once a day. Dx E11.8 100 each 1   acetaminophen (TYLENOL) 325 MG tablet Take 2 tablets (650 mg total) by mouth every 4 (four) hours as needed for mild pain (or temp > 37.5 C (99.5 F)).     albuterol (VENTOLIN HFA) 108 (90 Base) MCG/ACT inhaler Inhale 2 puffs into the lungs every 6 (six) hours as needed. 18 g 0   amLODipine (NORVASC) 10 MG tablet TAKE 1 TABLET(10 MG) BY MOUTH DAILY 90 tablet 0   aspirin 81 MG chewable tablet Chew 1 tablet (81 mg total) by mouth daily.     atorvastatin (LIPITOR) 80 MG tablet TAKE 1 TABLET(80 MG) BY MOUTH DAILY 30 tablet 5   Blood Glucose Monitoring Suppl (ACCU-CHEK GUIDE ME) w/Device KIT Use to check Blood sugars once a day. Dx E11.8 1 kit 0   buPROPion (WELLBUTRIN XL) 150 MG 24 hr tablet Take 1 tablet (150 mg total) by mouth daily. 30 tablet 3    cyclobenzaprine (FLEXERIL) 5 MG tablet Take 1-2 tablets (5-10 mg total) by mouth 3 (three) times daily as needed for muscle spasms. 30 tablet 0   glucose blood (ACCU-CHEK GUIDE) test strip Use to check Blood sugars once a day. Dx E11.8 100 each 1   HYDROcodone-acetaminophen (NORCO) 5-325 MG tablet 1 tab every 6 hours prn severe pain 8 tablet 0   losartan (COZAAR) 100 MG tablet Take 1 tablet (100 mg total) by mouth daily. 90 tablet 3   metFORMIN (GLUCOPHAGE) 500 MG tablet Take 1 tablet (500 mg total) by mouth 2 (two) times daily with a meal. (Patient taking differently: Take 500 mg by mouth daily with breakfast.) 60 tablet 3   Multiple Vitamins-Minerals (MULTIVITAMIN WITH MINERALS) tablet Take 1 tablet by mouth daily.     ondansetron (ZOFRAN) 4 MG tablet Take 1-2 tablets (4-8 mg total) by mouth every 8 (eight) hours as needed for nausea or vomiting. 20 tablet  0   No current facility-administered medications on file prior to visit.    BP (!) 162/80   Pulse 72   Ht 5\' 8"  (1.727 m)   Wt 216 lb (98 kg)   SpO2 99%   BMI 32.84 kg/m      Review of Systems See hpi    Objective:   Physical Exam General- No acute distress. Pleasant patient. Neck- Full range of motion, no jvd Lungs- Clear, even and unlabored. Heart- regular rate and rhythm. Neurologic- CNII- XII grossly intact.  Left shoulder- 3-4/10 anterior shoulder throb at rest. If starts to move shoulder pain 8-9/10        Assessment & Plan:  Assessment and Plan    Chronic Left Shoulder Pain Pain persists despite Norco use. Limited range of motion and muscle atrophy noted. Patient considering surgery but has logistical concerns. -Prescribe 60 tablets of Norco per month with a signed contract and urine drug screen. -Advise patient to avoid strenuous activity that exacerbates pain. -Encourage patient to pursue surgical consultation with Dr. Josefa Half. -controlled med rules explained.  Hypertension Blood pressure elevated at 160/80  despite current regimen of Amlodipine 10mg  and Losartan 100mg . -Add diuretic (HCTZ) to current regimen. -Advise patient to monitor blood pressure at home, especially when taking NSAIDs.  Possible Low Testosterone Patient reports fatigue, erectile dysfunction, and decreased strength. -Order morning testosterone level. -Order PSA and testosterone panel, given it has been two years since last check.  General Health Maintenance -Order A1C to check three-month sugar average. along with cmp when future lab done. -Check kidney function and potassium level due to addition of diuretic to regimen. -Schedule follow-up in six months for pain management and yearly for drug screen.   Follow up date to be determined after lab review. controlled med visits will be every 6 months.        Esperanza Richters, PA-C

## 2023-11-05 NOTE — Addendum Note (Signed)
Addended by: Gwenevere Abbot on: 11/05/2023 04:20 PM   Modules accepted: Orders

## 2023-11-07 LAB — DRUG MONITORING PANEL 375977 , URINE

## 2023-11-07 LAB — DM TEMPLATE

## 2023-11-07 MED ORDER — HYDROCODONE-ACETAMINOPHEN 5-325 MG PO TABS: ORAL_TABLET | ORAL | 0 refills | Status: AC

## 2023-11-07 NOTE — Addendum Note (Signed)
Addended by: Gwenevere Abbot on: 11/07/2023 02:18 PM   Modules accepted: Orders

## 2023-11-12 ENCOUNTER — Other Ambulatory Visit (INDEPENDENT_AMBULATORY_CARE_PROVIDER_SITE_OTHER): Payer: Medicaid Other

## 2023-11-12 DIAGNOSIS — Z125 Encounter for screening for malignant neoplasm of prostate: Secondary | ICD-10-CM

## 2023-11-12 DIAGNOSIS — I10 Essential (primary) hypertension: Secondary | ICD-10-CM

## 2023-11-12 DIAGNOSIS — R5383 Other fatigue: Secondary | ICD-10-CM | POA: Diagnosis not present

## 2023-11-12 DIAGNOSIS — R6882 Decreased libido: Secondary | ICD-10-CM

## 2023-11-12 DIAGNOSIS — R739 Hyperglycemia, unspecified: Secondary | ICD-10-CM | POA: Diagnosis not present

## 2023-11-12 LAB — COMPREHENSIVE METABOLIC PANEL
ALT: 18 U/L (ref 0–53)
AST: 14 U/L (ref 0–37)
Albumin: 4 g/dL (ref 3.5–5.2)
Alkaline Phosphatase: 99 U/L (ref 39–117)
BUN: 10 mg/dL (ref 6–23)
CO2: 29 meq/L (ref 19–32)
Calcium: 9.5 mg/dL (ref 8.4–10.5)
Chloride: 105 meq/L (ref 96–112)
Creatinine, Ser: 0.94 mg/dL (ref 0.40–1.50)
GFR: 91.49 mL/min (ref >60.00)
Glucose, Bld: 109 mg/dL — ABNORMAL HIGH (ref 70–99)
Potassium: 3.8 meq/L (ref 3.5–5.1)
Sodium: 140 meq/L (ref 135–145)
Total Bilirubin: 0.6 mg/dL (ref 0.2–1.2)
Total Protein: 6.6 g/dL (ref 6.0–8.3)

## 2023-11-12 LAB — HEMOGLOBIN A1C: Hgb A1c MFr Bld: 6.2 % (ref 4.6–6.5)

## 2023-11-12 LAB — PSA: PSA: 2.12 ng/mL (ref 0.10–4.00)

## 2023-11-13 LAB — TESTOSTERONE TOTAL,FREE,BIO, MALES
Albumin: 3.9 g/dL (ref 3.6–5.1)
Sex Hormone Binding: 48 nmol/L (ref 10–50)
Testosterone, Bioavailable: 63.3 ng/dL — ABNORMAL LOW (ref 110.0–575.0)
Testosterone, Free: 35.3 pg/mL — ABNORMAL LOW (ref 46.0–224.0)
Testosterone: 365 ng/dL (ref 250–827)

## 2023-11-13 NOTE — Addendum Note (Signed)
Addended by: Gwenevere Abbot on: 11/13/2023 11:04 AM   Modules accepted: Orders

## 2023-11-18 ENCOUNTER — Telehealth: Payer: Self-pay | Admitting: Medical

## 2023-11-18 NOTE — Telephone Encounter (Signed)
PA initiated via Covermymeds; KEY: [id number]. Awaiting determination.

## 2023-11-18 NOTE — Telephone Encounter (Signed)
Pt called stating that a PA needed to be started for his HYDROcodone-acetaminophen (NORCO) 5-325 MG tablet. Pt stated that they had reached out to insurance and they had started the process in CoverMyMeds.

## 2023-11-19 NOTE — Telephone Encounter (Signed)
PA approved.   PA Case: [id number], Status: Approved, Coverage Starts on: 11/18/2023 12:00:00 AM, Coverage Ends on: 05/16/2024 12:00:00 AM. Authorization Expiration Date: 05/15/2024

## 2023-12-10 ENCOUNTER — Ambulatory Visit: Payer: Medicaid Other | Admitting: Urology

## 2023-12-10 ENCOUNTER — Encounter: Payer: Self-pay | Admitting: Urology

## 2023-12-10 VITALS — BP 156/88 | HR 73 | Ht 67.5 in | Wt 215.0 lb

## 2023-12-10 DIAGNOSIS — R972 Elevated prostate specific antigen [PSA]: Secondary | ICD-10-CM | POA: Diagnosis not present

## 2023-12-10 DIAGNOSIS — N529 Male erectile dysfunction, unspecified: Secondary | ICD-10-CM

## 2023-12-10 MED ORDER — TADALAFIL 20 MG PO TABS: 20.0000 mg | ORAL_TABLET | Freq: Every day | ORAL | 11 refills | Status: DC | PRN

## 2023-12-10 NOTE — Progress Notes (Signed)
Assessment: 1. Elevated PSA   2. Erectile dysfunction of organic origin      Plan: Today I had a long discussion with the patient regarding PSA testing and the issues and controversies regarding prostate cancer early detection.  His total PSA at his fairly normal for his age however it did go up more than expected over the past 2 years.  I have recommended short interval follow-up in 4 to 6 months with repeat PSA.  He is agreeable.  Today also had a long discussion with the patient regarding management of his ED using a goal oriented approach.  He would like to try new medical therapy. Rx: Cialis 20 mg Nature of medication including proper utilization as well as potential adverse events and side effects reviewed.  Chief Complaint: Elevated PSA  History of Present Illness:  Aaron Gibson is a 57 y.o. male with past medical history of diabetes, hypertension, as well as history of stroke who is seen in consultation from Esperanza Richters, [place] for evaluation of increased PSA velocity. Patient reports minimal lower urinary tract symptoms.  Current IPSS = 5. There is no family history of prostate cancer.  Patient does report some mild ED.  Often times he does not feel like he gets fully erect and sometimes loses his erection prior to completing intercourse.  He has not tried any medical therapy previously.  PSA data: 08/2018  0.51 01/2021  0.76 10/2023 2.12  Past Medical History:  Past Medical History:  Diagnosis Date   Hypertension    untreated   Morbid obesity (HCC)    Pre-diabetes    per patient report   Stroke [facility])    2021- left sided weakness per patient report    Past Surgical History:  Past Surgical History:  Procedure Laterality Date   BUBBLE STUDY  01/02/2021   Procedure: BUBBLE STUDY;  Surgeon: Meriam Sprague, MD;  Location: [facility] ENDOSCOPY;  Service: Cardiovascular;;   SHOULDER ARTHROSCOPY WITH SUBACROMIAL DECOMPRESSION, ROTATOR CUFF REPAIR AND BICEP TENDON  REPAIR Left 08/01/2022   Procedure: LEFT SHOULDER SCOPE, EXTENSIVE DEBRIDEMENT, SUBACROMIAL DECOMPRESSION, BICEPS TENODESIS.;  Surgeon: Tarry Kos, MD;  Location: [facility] OR;  Service: Orthopedics;  Laterality: Left;   TEE WITHOUT CARDIOVERSION N/A 01/02/2021   Procedure: TRANSESOPHAGEAL ECHOCARDIOGRAM (TEE);  Surgeon: Meriam Sprague, MD;  Location: [facility] ENDOSCOPY;  Service: Cardiovascular;  Laterality: N/A;   TONSILLECTOMY     UMBILICAL HERNIA REPAIR N/A 03/15/2015   Procedure: UMBILICAL HERNIA REPAIR WITH MESH;  Surgeon: Abigail Miyamoto, MD;  Location: [facility] OR;  Service: General;  Laterality: N/A;    Allergies:  No Known Allergies  Family History:  No family history on file.  Social History:  Social History   Tobacco Use   Smoking status: Every Day    Current packs/day: 0.50    Average packs/day: 0.5 packs/day for 29.0 years (14.5 ttl pk-yrs)    Types: Cigarettes   Smokeless tobacco: Never   Tobacco comments:    quit 12/28/2020  Vaping Use   Vaping status: Never Used  Substance Use Topics   Alcohol use: Yes    Comment: occasionally holidays   Drug use: No    Review of symptoms:  Constitutional:  Negative for unexplained weight loss, night sweats, fever, chills ENT:  Negative for nose bleeds, sinus pain, painful swallowing CV:  Negative for chest pain, shortness of breath, exercise intolerance, palpitations, loss of consciousness Resp:  Negative for cough, wheezing, shortness of breath GI:  Negative for nausea, vomiting, diarrhea,  bloody stools GU:  Positives noted in HPI; otherwise negative for gross hematuria, dysuria, urinary incontinence Neuro:  Negative for seizures, poor balance, limb weakness, slurred speech Psych:  Negative for lack of energy, depression, anxiety Endocrine:  Negative for polydipsia, polyuria, symptoms of hypoglycemia (dizziness, hunger, sweating) Hematologic:  Negative for anemia, purpura, petechia, prolonged or excessive bleeding, use of  anticoagulants  Allergic:  Negative for difficulty breathing or choking as a result of exposure to anything; no shellfish allergy; no allergic response (rash/itch) to materials, foods  Physical exam: BP (!) 156/88   Pulse 73   Ht 5' 7.5" (1.715 m)   Wt 215 lb (97.5 kg)   BMI 33.18 kg/m  GENERAL APPEARANCE:  Well appearing, well developed, well nourished, NAD HEENT: Atraumatic, Normocephalic, oropharynx clear.  GU: Normal external genitalia DRE: Normal sphincter tone; prostate is approximately 30 g without evidence of nodules or induration

## 2023-12-10 NOTE — Addendum Note (Signed)
Addended by: Carolin Coy on: 12/10/2023 01:55 PM   Modules accepted: Orders

## 2024-03-01 NOTE — Progress Notes (Unsigned)
   Office Visit Note   Patient: Aaron Gibson           Date of Birth: [birth year]           MRN: [id number] Visit Date: 03/03/2024              Requested by:  Esperanza Richters, PA-C [id number] [place],  [place] [address]  PCP: Esperanza Richters, PA-C   Assessment & Plan: Visit Diagnoses: No diagnosis found.  Plan: ***  Follow-Up Instructions: No follow-ups on file.   Orders:  No orders of the defined types were placed in this encounter. No orders of the defined types were placed in this encounter.    Procedures: No procedures performed   Clinical Data: No additional findings.   Subjective: No chief complaint on file.  HPI  Review of Systems   Objective: Vital Signs: There were no vitals taken for this visit.  Physical Exam  Ortho Exam  Specialty Comments:  No specialty comments available.  Imaging: No results found.   PMFS History: Patient Active Problem List   Diagnosis Date Noted  . 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 Impingement syndrome of left shoulder 07/12/2022  . Partial nontraumatic tear of left rotator cuff 07/12/2022  . 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  . Cerebrovascular accident (CVA) of right basal ganglia (HCC) 01/02/2021  . CVA (cerebral vascular accident) (HCC) 12/29/2020  . 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 Chest pain, musculoskeletal 12/28/2020  . Morbid obesity (HCC)   . Hypertension    Past Medical History:  Diagnosis Date  . Hypertension    untreated  . Morbid obesity (HCC)   . Pre-diabetes    per patient report  . Stroke [facility])    2021- left sided weakness per patient report    No family history on file.  Past Surgical History:  Procedure Laterality Date  . BUBBLE STUDY  01/02/2021   Procedure: BUBBLE STUDY;  Surgeon: Meriam Sprague, MD;  Location: [facility] ENDOSCOPY;   Service: Cardiovascular;;  . SHOULDER ARTHROSCOPY WITH SUBACROMIAL DECOMPRESSION, ROTATOR CUFF REPAIR AND BICEP TENDON REPAIR Left 08/01/2022   Procedure: LEFT SHOULDER SCOPE, EXTENSIVE DEBRIDEMENT, SUBACROMIAL DECOMPRESSION, BICEPS TENODESIS.;  Surgeon: Tarry Kos, MD;  Location: [facility] OR;  Service: Orthopedics;  Laterality: Left;  . TEE WITHOUT CARDIOVERSION N/A 01/02/2021   Procedure: TRANSESOPHAGEAL ECHOCARDIOGRAM (TEE);  Surgeon: Meriam Sprague, MD;  Location: [facility] ENDOSCOPY;  Service: Cardiovascular;  Laterality: N/A;  . TONSILLECTOMY    . UMBILICAL HERNIA REPAIR N/A 03/15/2015   Procedure: UMBILICAL HERNIA REPAIR WITH MESH;  Surgeon: Abigail Miyamoto, MD;  Location: [facility] OR;  Service: General;  Laterality: N/A;   Social History   Occupational History  . Occupation: Unemployed   Tobacco Use  . Smoking status: Every Day    Current packs/day: 0.50    Average packs/day: 0.5 packs/day for 29.0 years (14.5 ttl pk-yrs)    Types: Cigarettes  . Smokeless tobacco: Never  . Tobacco comments:    quit 12/28/2020  Vaping Use  . Vaping status: Never Used  Substance and Sexual Activity  . Alcohol use: Yes    Comment: occasionally holidays  . Drug use: No  . Sexual activity: Yes

## 2024-03-03 ENCOUNTER — Encounter: Payer: Self-pay | Admitting: Orthopaedic Surgery

## 2024-03-03 ENCOUNTER — Ambulatory Visit: Admitting: Orthopaedic Surgery

## 2024-03-03 DIAGNOSIS — G8929 Other chronic pain: Secondary | ICD-10-CM

## 2024-03-03 DIAGNOSIS — Z9889 Other specified postprocedural states: Secondary | ICD-10-CM | POA: Diagnosis not present

## 2024-03-03 DIAGNOSIS — M25512 Pain in left shoulder: Secondary | ICD-10-CM | POA: Diagnosis not present

## 2024-03-05 ENCOUNTER — Other Ambulatory Visit: Payer: Self-pay

## 2024-03-05 ENCOUNTER — Ambulatory Visit: Admitting: Sports Medicine

## 2024-03-05 ENCOUNTER — Encounter: Payer: Self-pay | Admitting: Sports Medicine

## 2024-03-05 DIAGNOSIS — I63311 Cerebral infarction due to thrombosis of right middle cerebral artery: Secondary | ICD-10-CM

## 2024-03-05 DIAGNOSIS — M19012 Primary osteoarthritis, left shoulder: Secondary | ICD-10-CM | POA: Diagnosis not present

## 2024-03-05 DIAGNOSIS — M25512 Pain in left shoulder: Secondary | ICD-10-CM

## 2024-03-05 DIAGNOSIS — E1169 Type 2 diabetes mellitus with other specified complication: Secondary | ICD-10-CM

## 2024-03-05 DIAGNOSIS — G8929 Other chronic pain: Secondary | ICD-10-CM

## 2024-03-05 MED ORDER — LIDOCAINE HCL 1 % IJ SOLN
2.0000 mL | INTRAMUSCULAR | Status: AC | PRN
  Administered 2024-03-05: 2 mL

## 2024-03-05 MED ORDER — METHYLPREDNISOLONE ACETATE 40 MG/ML IJ SUSP
40.0000 mg | INTRAMUSCULAR | Status: AC | PRN
  Administered 2024-03-05: 40 mg via INTRA_ARTICULAR

## 2024-03-05 MED ORDER — BUPIVACAINE HCL 0.25 % IJ SOLN
2.0000 mL | INTRAMUSCULAR | Status: AC | PRN
  Administered 2024-03-05: 2 mL via INTRA_ARTICULAR

## 2024-03-05 NOTE — Progress Notes (Signed)
 Office and Procedure Note  Patient: Aaron Gibson             Date of Birth: 1966/06/25           MRN: [id number]             Visit Date: 03/05/2024  HPI: Aaron Gibson is a pleasant 58 year old male who presents today for chronic left shoulder pain.  He does have shoulder arthritis and has had previous rotator cuff arthroscopy.  His course is complicated by a previous CVA which left him with some left-sided deficits.  He is a type II diabetic but relatively well-controlled, he has been on and off metformin 500mg .  He reports he takes this when he remembers.  He is interested in proceeding with injection into the shoulder today. Dr. Roda Shutters did send a referral for formalized physical therapy, he has not yet received a call, but plans to proceed.  He also has some questions today regarding shoulder replacement.  PE:  - Left shoulder: There are well-healed prior arthroscopy incisions over the posterior lateral shoulder, no redness swelling or effusion.  He has active flexion to about 95 degrees, unable to take him further passively to about 140 degrees although pain at end range here and with abduction as well.  Imaging:  Narrative & Impression  CLINICAL DATA:  Severe left shoulder pain. History of left shoulder surgery years ago.   EXAM: LEFT SHOULDER - 2+ VIEW   COMPARISON:  Left shoulder radiographs 09/05/2021   FINDINGS: Mild inferior glenoid and humeral head-neck junction degenerative spurring. Minimal acromioclavicular joint space narrowing and peripheral osteophytosis. Mild curvilinear calcific density superolateral aspect of the humeral head, possible new chronic calcific tendinosis of the superior rotator cuff.   There are 3 small round lucencies overlying the superior mid aspect of the left humeral head and the proximal humeral surgical neck, possible interval postsurgical changes. No acute fracture or dislocation.   IMPRESSION: 1. Mild glenohumeral and minimal acromioclavicular  osteoarthritis. 2. Likely mild chronic calcific tendinosis of the superior rotator cuff.     Electronically Signed   By: Aaron Gibson M.D.   On: 10/21/2023 09:00   *I did personally review his left shoulder x-ray today during the office visit.  Visit Diagnoses:  1. Primary osteoarthritis, left shoulder   2. Chronic left shoulder pain   3. Type 2 diabetes mellitus with other specified complication, without long-term current use of insulin (HCC)   4. Cerebrovascular accident (CVA) due to thrombosis of right middle cerebral artery (HCC)    Procedures: Large Joint Inj: L glenohumeral on 03/05/2024 8:36 AM Indications: pain Details: 22 G 3.5 in needle, ultrasound-guided posterior approach Medications: 2 mL lidocaine 1 %; 2 mL bupivacaine 0.25 %; 40 mg methylPREDNISolone acetate 40 MG/ML Outcome: tolerated well, no immediate complications  US-guided glenohumeral joint injection, left shoulder After discussion on risks/benefits/indications, informed verbal consent was obtained. A timeout was then performed. The patient was positioned lying lateral recumbent on examination table. The patient's shoulder was prepped with betadine and multiple alcohol swabs and utilizing ultrasound guidance, the patient's glenohumeral joint was identified on ultrasound. Using ultrasound guidance a 22-gauge, 3.5 inch needle with a mixture of 2:2:1 cc's lidocaine:bupivicaine:depomedrol was directed from a lateral to medial direction via in-plane technique into the glenohumeral joint with visualization of appropriate spread of injectate into the joint. Patient tolerated the procedure well without immediate complications.      Procedure, treatment alternatives, risks and benefits explained, specific risks discussed. Consent was given  by the patient. Immediately prior to procedure a time out was called to verify the correct patient, procedure, equipment, support staff and site/side marked as required. Patient was  prepped and draped in the usual sterile fashion.     Plan: -Discussed the nature of his shoulder pain which is multifactorial with both arthritic change and prior arthroscopy for the rotator cuff, in the setting of CVA with residual hemiparesis.  -Through shared decision-making, did proceed with ultrasound-guided glenohumeral joint injection, patient tolerated well.  Advised on 48 hours of modified rest/activity.  Discussed the importance of getting started in formalized physical therapy to work on improving his range of motion, strength and overall function -Discussed transient rise in glucose elevation, although he does not need to check his sugars given he is well-controlled and not on any insulin therapy, he may continue his metformin 500 mg once to twice daily as indicated by his PCP -We had a brief discussion on shoulder replacement and what would be the right age for this, would like to see how he responds to the injection and his physical therapy first.  He will follow-up with Dr. Roda Shutters as indicated for this; I am happy to see him as needed.  Aaron Brunner, DO Primary Care Sports Medicine Physician  [facility]  This note was dictated using Dragon naturally speaking software and may contain errors in syntax, spelling, or content which have not been identified prior to signing this note.

## 2024-04-20 ENCOUNTER — Encounter: Payer: Self-pay | Admitting: Medical

## 2024-04-22 ENCOUNTER — Encounter: Payer: Self-pay | Admitting: Medical

## 2024-04-22 ENCOUNTER — Ambulatory Visit: Admitting: Medical

## 2024-04-22 VITALS — BP 140/80 | HR 80 | Resp 18 | Ht 67.5 in | Wt 218.0 lb

## 2024-04-22 DIAGNOSIS — M25512 Pain in left shoulder: Secondary | ICD-10-CM

## 2024-04-22 DIAGNOSIS — Z1211 Encounter for screening for malignant neoplasm of colon: Secondary | ICD-10-CM

## 2024-04-22 DIAGNOSIS — E118 Type 2 diabetes mellitus with unspecified complications: Secondary | ICD-10-CM

## 2024-04-22 DIAGNOSIS — I10 Essential (primary) hypertension: Secondary | ICD-10-CM | POA: Diagnosis not present

## 2024-04-22 DIAGNOSIS — Z7984 Long term (current) use of oral hypoglycemic drugs: Secondary | ICD-10-CM

## 2024-04-22 DIAGNOSIS — E785 Hyperlipidemia, unspecified: Secondary | ICD-10-CM | POA: Diagnosis not present

## 2024-04-22 DIAGNOSIS — G8929 Other chronic pain: Secondary | ICD-10-CM | POA: Diagnosis not present

## 2024-04-22 LAB — MICROALBUMIN / CREATININE URINE RATIO
Creatinine,U: 411.7 mg/dL
Microalb Creat Ratio: 3.5 mg/g (ref 0.0–30.0)
Microalb, Ur: 1.4 mg/dL (ref 0.0–1.9)

## 2024-04-22 LAB — COMPREHENSIVE METABOLIC PANEL WITH GFR
ALT: 26 U/L (ref 0–53)
AST: 19 U/L (ref 0–37)
Albumin: 4.4 g/dL (ref 3.5–5.2)
Alkaline Phosphatase: 105 U/L (ref 39–117)
BUN: 13 mg/dL (ref 6–23)
CO2: 29 meq/L (ref 19–32)
Calcium: 9.6 mg/dL (ref 8.4–10.5)
Chloride: 102 meq/L (ref 96–112)
Creatinine, Ser: 1.11 mg/dL (ref 0.40–1.50)
GFR: 74.71 mL/min (ref >60.00)
Glucose, Bld: 105 mg/dL — ABNORMAL HIGH (ref 70–99)
Potassium: 4.1 meq/L (ref 3.5–5.1)
Sodium: 140 meq/L (ref 135–145)
Total Bilirubin: 0.5 mg/dL (ref 0.2–1.2)
Total Protein: 7.2 g/dL (ref 6.0–8.3)

## 2024-04-22 LAB — LIPID PANEL
Cholesterol: 166 mg/dL (ref 0–200)
HDL: 48.7 mg/dL (ref >39.00)
LDL Cholesterol: 103 mg/dL — ABNORMAL HIGH (ref 0–99)
NonHDL: 117.79
Total CHOL/HDL Ratio: 3
Triglycerides: 76 mg/dL (ref 0.0–149.0)
VLDL: 15.2 mg/dL (ref 0.0–40.0)

## 2024-04-22 LAB — HEMOGLOBIN A1C: Hgb A1c MFr Bld: 6.3 % (ref 4.6–6.5)

## 2024-04-22 MED ORDER — METFORMIN HCL 500 MG PO TABS: 500.0000 mg | ORAL_TABLET | Freq: Every day | ORAL | 3 refills | Status: AC

## 2024-04-22 MED ORDER — ATORVASTATIN CALCIUM 80 MG PO TABS: 80.0000 mg | ORAL_TABLET | Freq: Every day | ORAL | 3 refills | Status: AC

## 2024-04-22 MED ORDER — AMLODIPINE BESYLATE 10 MG PO TABS: 10.0000 mg | ORAL_TABLET | Freq: Every day | ORAL | 3 refills | Status: AC

## 2024-04-22 MED ORDER — ACCU-CHEK SOFTCLIX LANCETS MISC
3 refills | Status: AC
Start: 2024-04-22 — End: ?

## 2024-04-22 NOTE — Patient Instructions (Addendum)
 Hypertension Home blood pressure consistently high. Office reading lower but still high above goal of 130/90 due to stroke history.Aaron Gibson Possible inaccurate home cuff. History of stroke requires strict control. Amlodipine  prescribed to achieve target <130/90 mmHg. - Prescribed amlodipine  10 mg daily. Continue losartan  100 mg daily - Schedule nurse visit for manual blood pressure check in 10-14 days post-amlodipine  initiation. - Instructed to bring home cuff for check compared to our manual reading.  Stroke Preventive management. - Prescribed atorvastatin  at highest dose  80 mg per neurologist. - Instructed to take aspirin  81 mg daily per neurologist.  Type 2 diabetes mellitus A1c improved with diet. Metformin  lapsed. Glycemic control needs reassessment. - Ordered A1c test to assess glycemic control. - Consider restarting metformin  based on A1c results.  Chronic shoulder pain Limited range of motion and mechanical issues. Previous interventions provided limited relief. Further assessment with orthopedic surgeon scheduled. - Continue current pain management as needed.(rare us  of norco). contact and uds up to date. - Follow up with orthopedic surgeon for further assessment and potential imaging.  cmp, lipid panel and a1c today.  Refer for colonoscopy  Follow up date to be determined after lab review.

## 2024-04-22 NOTE — Progress Notes (Signed)
 Subjective:    Patient ID: Aaron Gibson, male    DOB: 1966-07-03, 58 y.o.   MRN: [id number]  HPI  Aaron Gibson is a 58 year old male with hypertension and diabetes who presents for medication management and follow-up on chronic shoulder pain.  He has not been taking amlodipine  regularly due to confusion about his prescriptions. His pharmacy indicated he had not refilled amlodipine  or metformin  in a while. He has been spacing out his doses and is unsure about his current medication regimen. He is currently taking losartan  but has not taken amlodipine  for two years. His home blood pressure readings are consistently high, around 160/80 mmHg, despite dietary changes to reduce salt and sugar intake. He is concerned about the accuracy of his home blood pressure monitor.  His blood sugar levels have been previously elevated, with an average of 145 mg/dL two years ago, placing him in the diabetic range. However, five months ago, his average was 120 mg/dL. He has not been taking metformin  consistently due to the age of his medication and is unsure about his current diabetes management.  He experiences chronic shoulder pain and limited range of motion following a previous surgery. He can lift his arm with assistance but experiences mechanical locking and popping sounds. He is scheduled to see the orthopedic surgeon tomorrow for further assessment. He has been using pain medication sparingly, with about five or six tablets remaining from a previous prescription. He takes them only when the pain is aggravated.  He has a history of elevated PSA levels, which led to a referral to a urologist. The urologist performed a rectal exam and indicated that his PSA levels were appropriate for his age. He is scheduled for a follow-up appointment in July.  He mentions a history of stroke and is aware of the need to manage his blood pressure and cholesterol levels. He has been advised to take baby aspirin  but does not do  so daily.      Pt declines shingrix and pneumonia vaccine. Explained can reconsider and get scheduled as nurse visit.  Review of Systems  Constitutional:  Negative for chills, fatigue and fever.  Respiratory:  Negative for cough, chest tightness and wheezing.   Cardiovascular:  Negative for chest pain and palpitations.  Gastrointestinal:  Negative for abdominal pain and constipation.  Genitourinary:  Negative for dysuria and frequency.  Musculoskeletal:  Negative for back pain.  Skin:  Negative for rash.  Neurological:  Negative for syncope, facial asymmetry, weakness and light-headedness.  Hematological:  Negative for adenopathy. Does not bruise/bleed easily.  Psychiatric/Behavioral:  Negative for behavioral problems, decreased concentration and sleep disturbance. The patient is not nervous/anxious.     Past Medical History:  Diagnosis Date   Hypertension    untreated   Morbid obesity (HCC)    Pre-diabetes    per patient report   Stroke [facility])    2021- left sided weakness per patient report     Social History   Socioeconomic History   Marital status: Single    Spouse name: Not on file   Number of children: Not on file   Years of education: Not on file   Highest education level: Associate degree: occupational, Scientist, product/process development, or vocational program  Occupational History   Occupation: Unemployed   Tobacco Use   Smoking status: Every Day    Current packs/day: 0.50    Average packs/day: 0.5 packs/day for 29.0 years (14.5 ttl pk-yrs)    Types: Cigarettes   Smokeless tobacco: Never  Tobacco comments:    quit 12/28/2020  Vaping Use   Vaping status: Never Used  Substance and Sexual Activity   Alcohol use: Yes    Comment: occasionally holidays   Drug use: No   Sexual activity: Yes  Other Topics Concern   Not on file  Social History Narrative   Lives with daughter   Right Handed   Caffeine 2x week   Social Drivers of Health   Financial Resource Strain: Medium Risk  (04/22/2024)   Overall Financial Resource Strain (CARDIA)    Difficulty of Paying Living Expenses: Somewhat hard  Food Insecurity: Food Insecurity Present (04/22/2024)   Hunger Vital Sign    Worried About Running Out of Food in the Last Year: Sometimes true    Ran Out of Food in the Last Year: Sometimes true  Transportation Needs: Unknown (04/22/2024)   PRAPARE - Transportation    Lack of Transportation (Medical): No    Lack of Transportation (Non-Medical): Patient declined  Physical Activity: Insufficiently Active (04/22/2024)   Exercise Vital Sign    Days of Exercise per Week: 2 days    Minutes of Exercise per Session: 60 min  Stress: No Stress Concern Present (04/22/2024)   [employer] of Occupational Health - Occupational Stress Questionnaire    Feeling of Stress : Only a little  Social Connections: Unknown (04/22/2024)   Social Connection and Isolation Panel [NHANES]    Frequency of Communication with Friends and Family: Twice a week    Frequency of Social Gatherings with Friends and Family: Patient declined    Attends Religious Services: Patient declined    Database administrator or Organizations: No    Attends Engineer, structural: Not on file    Marital Status: Patient declined  Intimate Partner Violence: Not on file    Past Surgical History:  Procedure Laterality Date   BUBBLE STUDY  01/02/2021   Procedure: BUBBLE STUDY;  Surgeon: Sonny Dust, MD;  Location: [facility] ENDOSCOPY;  Service: Cardiovascular;;   SHOULDER ARTHROSCOPY WITH SUBACROMIAL DECOMPRESSION, ROTATOR CUFF REPAIR AND BICEP TENDON REPAIR Left 08/01/2022   Procedure: LEFT SHOULDER SCOPE, EXTENSIVE DEBRIDEMENT, SUBACROMIAL DECOMPRESSION, BICEPS TENODESIS.;  Surgeon: Wes Hamman, MD;  Location: [facility] OR;  Service: Orthopedics;  Laterality: Left;   TEE WITHOUT CARDIOVERSION N/A 01/02/2021   Procedure: TRANSESOPHAGEAL ECHOCARDIOGRAM (TEE);  Surgeon: Sonny Dust, MD;  Location: [facility] ENDOSCOPY;   Service: Cardiovascular;  Laterality: N/A;   TONSILLECTOMY     UMBILICAL HERNIA REPAIR N/A 03/15/2015   Procedure: UMBILICAL HERNIA REPAIR WITH MESH;  Surgeon: Oza Blumenthal, MD;  Location: [facility] OR;  Service: General;  Laterality: N/A;    No family history on file.  No Known Allergies  Current Outpatient Medications on File Prior to Visit  Medication Sig Dispense Refill   albuterol  (VENTOLIN  HFA) 108 (90 Base) MCG/ACT inhaler Inhale 2 puffs into the lungs every 6 (six) hours as needed. 18 g 0   aspirin  81 MG chewable tablet Chew 1 tablet (81 mg total) by mouth daily.     Blood Glucose Monitoring Suppl (ACCU-CHEK GUIDE ME) w/Device KIT Use to check Blood sugars once a day. Dx E11.8 1 kit 0   buPROPion  (WELLBUTRIN  XL) 150 MG 24 hr tablet Take 1 tablet (150 mg total) by mouth daily. 30 tablet 3   cyclobenzaprine  (FLEXERIL ) 5 MG tablet Take 1-2 tablets (5-10 mg total) by mouth 3 (three) times daily as needed for muscle spasms. 30 tablet 0   glucose blood (ACCU-CHEK GUIDE)  test strip Use to check Blood sugars once a day. Dx E11.8 100 each 1   hydrochlorothiazide  (MICROZIDE ) 12.5 MG capsule TAKE 1 CAPSULE(12.5 MG) BY MOUTH DAILY 90 capsule 1   HYDROcodone -acetaminophen  (NORCO) 5-325 MG tablet 1 tab po bid prn severe pain 60 tablet 0   losartan  (COZAAR ) 100 MG tablet Take 1 tablet (100 mg total) by mouth daily. 90 tablet 3   metFORMIN  (GLUCOPHAGE ) 500 MG tablet Take 1 tablet (500 mg total) by mouth 2 (two) times daily with a meal. (Patient taking differently: Take 500 mg by mouth daily with breakfast.) 60 tablet 3   Multiple Vitamins-Minerals (MULTIVITAMIN WITH MINERALS) tablet Take 1 tablet by mouth daily.     ondansetron  (ZOFRAN ) 4 MG tablet Take 1-2 tablets (4-8 mg total) by mouth every 8 (eight) hours as needed for nausea or vomiting. 20 tablet 0   tadalafil  (CIALIS ) 20 MG tablet Take 1 tablet (20 mg total) by mouth daily as needed for erectile dysfunction. 10 tablet 11   No current  facility-administered medications on file prior to visit.    BP (!) 140/80   Pulse 80   Resp 18   Ht 5' 7.5" (1.715 m)   Wt 218 lb (98.9 kg)   SpO2 95%   BMI 33.64 kg/m   `     Objective:   Physical Exam  General Mental Status- Alert. General Appearance- Not in acute distress.   Skin General: Color- Normal Color. Moisture- Normal Moisture.  Neck No JVD.  Chest and Lung Exam Auscultation: Breath Sounds:-CTA  Cardiovascular Auscultation:Rythm- RRR Murmurs & Other Heart Sounds:Auscultation of the heart reveals- No Murmurs.   Neurologic Cranial Nerve exam:- CN III-XII intact(No nystagmus), symmetric smile.       Assessment & Plan:   Patient Instructions  Hypertension Home blood pressure consistently high. Office reading lower but still high above goal of 130/90 due to stroke history.Aaron Aas Possible inaccurate home cuff. History of stroke requires strict control. Amlodipine  prescribed to achieve target <130/90 mmHg. - Prescribed amlodipine  10 mg daily. Continue losartan  100 mg daily - Schedule nurse visit for manual blood pressure check in 10-14 days post-amlodipine  initiation. - Instructed to bring home cuff for check compared to our manual reading.  Stroke Preventive management. - Prescribed atorvastatin  at highest dose  80 mg per neurologist. - Instructed to take aspirin  81 mg daily per neurologist.  Type 2 diabetes mellitus A1c improved with diet. Metformin  lapsed. Glycemic control needs reassessment. - Ordered A1c test to assess glycemic control. - Consider restarting metformin  based on A1c results.  Chronic shoulder pain Limited range of motion and mechanical issues. Previous interventions provided limited relief. Further assessment with orthopedic surgeon scheduled. - Continue current pain management as needed.(rare us  of norco). contact and uds up to date. - Follow up with orthopedic surgeon for further assessment and potential imaging.  cmp, lipid panel  and a1c today.  Refer for colonoscopy  Follow up date to be determined after lab review.   Kenry Daubert, PA-C

## 2024-04-29 NOTE — Therapy (Signed)
 OUTPATIENT PHYSICAL THERAPY SHOULDER EVALUATION   Patient Name: Aaron Gibson MRN: [id number] DOB:22-Apr-1966, 58 y.o., male Today's Date: 05/01/2024  END OF SESSION:  PT End of Session - 05/01/24 1236     Visit Number 1    Number of Visits 13    Date for PT Re-Evaluation 06/19/24    Authorization Type [employer]    Authorization - Visit Number 1    PT Start Time 1230    PT Stop Time 1315    PT Time Calculation (min) 45 min    Activity Tolerance Patient tolerated treatment well    Behavior During Therapy WFL for tasks assessed/performed             Past Medical History:  Diagnosis Date   Hypertension    untreated   Morbid obesity (HCC)    Pre-diabetes    per patient report   Stroke [facility])    2021- left sided weakness per patient report   Past Surgical History:  Procedure Laterality Date   BUBBLE STUDY  01/02/2021   Procedure: BUBBLE STUDY;  Surgeon: Sonny Dust, MD;  Location: [facility] ENDOSCOPY;  Service: Cardiovascular;;   SHOULDER ARTHROSCOPY WITH SUBACROMIAL DECOMPRESSION, ROTATOR CUFF REPAIR AND BICEP TENDON REPAIR Left 08/01/2022   Procedure: LEFT SHOULDER SCOPE, EXTENSIVE DEBRIDEMENT, SUBACROMIAL DECOMPRESSION, BICEPS TENODESIS.;  Surgeon: Wes Hamman, MD;  Location: [facility] OR;  Service: Orthopedics;  Laterality: Left;   TEE WITHOUT CARDIOVERSION N/A 01/02/2021   Procedure: TRANSESOPHAGEAL ECHOCARDIOGRAM (TEE);  Surgeon: Sonny Dust, MD;  Location: [facility] ENDOSCOPY;  Service: Cardiovascular;  Laterality: N/A;   TONSILLECTOMY     UMBILICAL HERNIA REPAIR N/A 03/15/2015   Procedure: UMBILICAL HERNIA REPAIR WITH MESH;  Surgeon: Oza Blumenthal, MD;  Location: [facility] OR;  Service: General;  Laterality: N/A;   Patient Active Problem List   Diagnosis Date Noted   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 08/01/2022   Impingement syndrome of left shoulder 07/12/2022   Partial nontraumatic tear of left rotator cuff 07/12/2022    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   Cerebrovascular accident (CVA) of right basal ganglia (HCC) 01/02/2021   CVA (cerebral vascular accident) (HCC) 12/29/2020   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  12/28/2020   Chest pain, musculoskeletal 12/28/2020   Morbid obesity (HCC)    Hypertension     PCP: Sylvia Everts, PA-C   REFERRING PROVIDER: Wes Hamman, MD  REFERRING DIAG:  [phone] (ICD-10-CM) - Chronic left shoulder pain  Z98.890 (ICD-10-CM) - S/P arthroscopy of left shoulder    THERAPY DIAG:  Chronic left shoulder pain  Muscle weakness (generalized)  Stiffness of left shoulder, not elsewhere classified  Rationale for Evaluation and Treatment: Rehabilitation  ONSET DATE: Chronis  SUBJECTIVE:  SUBJECTIVE STATEMENT: Pt reports his L shoulder feels out of socket and not able to lift his L hand above his shoulder. He endorses his L shoulder gets stuck, and he experiences pain and popping. Pt notes receiving PT after surgery in 2023.  Hand dominance: Right  PERTINENT HISTORY:   Left 08/01/2022   Procedure: LEFT SHOULDER SCOPE, EXTENSIVE DEBRIDEMENT, SUBACROMIAL DECOMPRESSION, BICEPS TENODESIS.;  Surgeon: Wes Hamman, MD;  Location: [facility] OR;  Service: Orthopedics;  Laterality: Left;  CVA affecting L side  PAIN:  Are you having pain? Yes: NPRS scale: 5-6/10 at rest. 9/10 with repeated use Pain location: L anterior shoulder Pain description: ache, sharp Aggravating factors: lifting Relieving factors: rest  PRECAUTIONS: None  RED FLAGS: None   WEIGHT BEARING RESTRICTIONS: No  FALLS:  Has patient fallen in last 6 months? No  LIVING ENVIRONMENT: Lives with: lives with their family Lives in: House/apartment Able to access  home  OCCUPATION: Not working  PLOF: Independent  PATIENT GOALS:improved use of the L arm with less pain  NEXT MD VISIT:   OBJECTIVE:  Note: Objective measures were completed at Evaluation unless otherwise noted.  DIAGNOSTIC FINDINGS:  DG L shoulder 10/01/23 IMPRESSION: 1. 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 2. Likely mild chronic calcific tendinosis of the superior rotator cuff.  PATIENT SURVEYS:  Quick Dash 37/55=59%  COGNITION: Overall cognitive status: Within functional limits for tasks assessed     SENSATION: WFL  POSTURE: Forward head, rounded shoulders, approx 1 finger width subluxation of the L GH jt  UPPER EXTREMITY ROM:   Active ROM Right eval Left eval  Shoulder flexion 140 60  Shoulder extension    Shoulder abduction    Shoulder adduction    Shoulder internal rotation T10 T10  Shoulder external rotation T4 C7  Elbow flexion    Elbow extension    Wrist flexion    Wrist extension    Wrist ulnar deviation    Wrist radial deviation    Wrist pronation    Wrist supination    (Blank rows = not tested)  UPPER EXTREMITY MMT: Strength tested in a neutral positions MMT Right eval Left eval  Shoulder flexion  3  Shoulder extension  3  Shoulder abduction  3  Shoulder adduction    Shoulder internal rotation  3  Shoulder external rotation  3  Middle trapezius    Lower trapezius    Elbow flexion    Elbow extension    Wrist flexion    Wrist extension    Wrist ulnar deviation    Wrist radial deviation    Wrist pronation    Wrist supination    Grip strength (lbs)    (Blank rows = not tested)  SHOULDER SPECIAL TESTS: Impingement tests: Hawkins/Kennedy impingement test: negative SLAP lesions: Crank test: negative Instability tests: Sulcus sign: positive  Rotator cuff assessment: Empty can test: negative and Full can test: negative Biceps assessment: Speed's test: negative  PALPATION:  L shoulder subluxed approx 1  finger width Markedly TTP of the anterior L shoulder  TREATMENT DATE:  [facility] Adult PT Treatment:                                                DATE: 05/01/24 Therapeutic Exercise: Developed, instructed in, and pt completed therex as noted in HEP   PATIENT EDUCATION: Education details: Eval findings, POC, HEP Person educated: Patient Education method: Explanation, Demonstration, Tactile cues, Verbal cues, and Handouts Education comprehension: verbalized understanding, returned demonstration, verbal cues required, and tactile cues required  HOME EXERCISE PROGRAM: Access Code: [email or web address] URL: [email or web address] Date: 05/01/2024 Prepared by: Liborio Reeds  Exercises - Shoulder External Rotation with Anchored Resistance  - 1 x daily - 7 x weekly - 3 sets - 10 reps - 3 hold - Standing Shoulder Shrug and Retraction with Resistance  - 1 x daily - 7 x weekly - 3 sets - 10 reps - 3 hold  ASSESSMENT:  CLINICAL IMPRESSION: Patient is a 58 y.o. male who was seen today for physical therapy evaluation and treatment for  M25.512,G89.29 (ICD-10-CM) - Chronic left shoulder pain  Z98.890 (ICD-10-CM) - S/P arthroscopy of left shoulder  Pt presents with approx 1 finger width subluxation of the L GH jt, L shoulder weakness, and decreased ROM and function due to pain and improper mechanics of the L GH jt. A HEP was initiated to address strength. Pt will benefit from skilled PT 2w6 to address impairments to optimize L shoulder function with less pain.  ,  OBJECTIVE IMPAIRMENTS: decreased ROM, decreased strength, impaired UE functional use, postural dysfunction, and pain.   ACTIVITY LIMITATIONS: carrying, lifting, bathing, dressing, reach over head, hygiene/grooming, and caring for others  PARTICIPATION LIMITATIONS: meal prep, cleaning, laundry, and driving  PERSONAL FACTORS: Fitness,  Past/current experiences, Time since onset of injury/illness/exacerbation, and 1 comorbidity: Hx CVA affecting L arm are also affecting patient's functional outcome.   REHAB POTENTIAL: Good  CLINICAL DECISION MAKING: Evolving/moderate complexity  EVALUATION COMPLEXITY: Moderate   GOALS:  SHORT TERM GOALS=LTGs  LONG TERM GOALS: Target date: 06/19/24  Pt will be Ind in a final HEP to maintain achieved LOF  Baseline: started Goal status: INITIAL  2.  Increase L shoulder flexion ROM to 120d or greater for improved functional use Baseline:  Goal status: INITIAL  3.  Pt will report 50% or greater improvement in L shoulder pain for improved function and QOL Baseline: 5-9/10 Goal status: INITIAL  4.  Pt will be able to lift 2# overhead for improved functional use of the L UE Baseline:  Goal status: INITIAL  5.  Increase L shoulder strength to 4/5 for improved function Baseline:  Goal status: INITIAL  6.  Pt's Quick Dash score will improve to 45% or less as indication of improved function  Baseline: 59% Goal status: INITIAL  PLAN:  PT FREQUENCY: 2x/week  PT DURATION: 6 weeks  PLANNED INTERVENTIONS: 97164- PT Re-evaluation, 97110-Therapeutic exercises, 97530- Therapeutic activity, [id number]- Neuromuscular re-education, 97535- Self Care, 84696- Manual therapy, G0283- Electrical stimulation (unattended), 97033- Ionotophoresis 4mg /ml Dexamethasone , Patient/Family education, Taping, Dry Needling, Joint mobilization, Cryotherapy, and Moist heat  PLAN FOR NEXT SESSION: Assess response to HEP; progress therex as indicated; use of modalities, manual therapy; and TPDN as indicated.  Seher Schlagel MS, PT 05/02/24 10:30 PM  For all possible CPT codes, reference the Planned Interventions line above.     Check all conditions that are expected to impact  treatment: {Conditions expected to impact treatment:Musculoskeletal disorders and Neurological condition and/or seizures   If treatment  provided at initial evaluation, no treatment charged due to lack of authorization.

## 2024-05-01 ENCOUNTER — Other Ambulatory Visit: Payer: Self-pay

## 2024-05-01 ENCOUNTER — Ambulatory Visit: Attending: Orthopaedic Surgery

## 2024-05-01 DIAGNOSIS — M25612 Stiffness of left shoulder, not elsewhere classified: Secondary | ICD-10-CM | POA: Insufficient documentation

## 2024-05-01 DIAGNOSIS — M25512 Pain in left shoulder: Secondary | ICD-10-CM | POA: Insufficient documentation

## 2024-05-01 DIAGNOSIS — Z9889 Other specified postprocedural states: Secondary | ICD-10-CM | POA: Insufficient documentation

## 2024-05-01 DIAGNOSIS — M6281 Muscle weakness (generalized): Secondary | ICD-10-CM | POA: Insufficient documentation

## 2024-05-01 DIAGNOSIS — G8929 Other chronic pain: Secondary | ICD-10-CM | POA: Insufficient documentation

## 2024-06-09 ENCOUNTER — Ambulatory Visit: Payer: Medicaid Other | Admitting: Urology

## 2024-06-09 ENCOUNTER — Encounter: Payer: Self-pay | Admitting: Urology

## 2024-06-09 VITALS — BP 153/83 | HR 66 | Ht 67.0 in | Wt 220.0 lb

## 2024-06-09 DIAGNOSIS — N529 Male erectile dysfunction, unspecified: Secondary | ICD-10-CM | POA: Diagnosis not present

## 2024-06-09 DIAGNOSIS — R972 Elevated prostate specific antigen [PSA]: Secondary | ICD-10-CM | POA: Diagnosis not present

## 2024-06-09 LAB — MICROSCOPIC EXAMINATION: Renal Epithel, UA: NEGATIVE /HPF

## 2024-06-09 LAB — URINALYSIS, ROUTINE W REFLEX MICROSCOPIC
Bilirubin, UA: NEGATIVE
Glucose, UA: NEGATIVE
Ketones, UA: NEGATIVE
Leukocytes,UA: NEGATIVE
Nitrite, UA: NEGATIVE
Protein,UA: NEGATIVE
Specific Gravity, UA: 1.025 (ref 1.005–1.030)
Urobilinogen, Ur: 0.2 mg/dL (ref 0.2–1.0)
pH, UA: 5.5 (ref 5.0–7.5)

## 2024-06-09 MED ORDER — TADALAFIL 20 MG PO TABS: 20.0000 mg | ORAL_TABLET | Freq: Every day | ORAL | 11 refills | Status: AC | PRN

## 2024-06-09 NOTE — Progress Notes (Signed)
 Assessment: 1. Rising PSA level   2. Erectile dysfunction of organic origin     Plan: I personally reviewed the patient's chart including provider notes, lab results. PSA today Continue tadalafil  20 mg as needed. Return to office in 6 months   Chief Complaint: Chief Complaint  Patient presents with   Elevated PSA    HPI: Aaron Gibson is a 58 y.o. male who presents for continued evaluation of rising PSA and ED. He was previously evaluated by Dr. Del Favia with his last visit in December 2024. PSA data: 08/2018             0.51 01/2021             0.76 10/2023  2.12  Patient reported minimal lower urinary tract symptoms.  IPSS = 5. There is no family history of prostate cancer. DRE 12/24:  30 g, NT, no nodules   Patient reported some mild ED.  Often times he did not feel like he achieved a full erection and rapid detumescence.  He had not tried any medical therapy previously.  He was given a prescription for Cialis  20 mg in December 2024.  He returns today for follow-up.  No new lower urinary tract symptoms.  He continues to have some occasional decreased stream and urgency.  No dysuria or gross hematuria. IPSS = 7/3. He continues to use tadalafil  20 mg as needed with good results.   Portions of the above documentation were copied from a prior visit for review purposes only.  Allergies: No Known Allergies  PMH: Past Medical History:  Diagnosis Date   Hypertension    untreated   Morbid obesity (HCC)    Pre-diabetes    per patient report   Stroke [facility])    2021- left sided weakness per patient report    PSH: Past Surgical History:  Procedure Laterality Date   BUBBLE STUDY  01/02/2021   Procedure: BUBBLE STUDY;  Surgeon: Sonny Dust, MD;  Location: [facility] ENDOSCOPY;  Service: Cardiovascular;;   SHOULDER ARTHROSCOPY WITH SUBACROMIAL DECOMPRESSION, ROTATOR CUFF REPAIR AND BICEP TENDON REPAIR Left 08/01/2022   Procedure: LEFT SHOULDER SCOPE, EXTENSIVE DEBRIDEMENT,  SUBACROMIAL DECOMPRESSION, BICEPS TENODESIS.;  Surgeon: Wes Hamman, MD;  Location: [facility] OR;  Service: Orthopedics;  Laterality: Left;   TEE WITHOUT CARDIOVERSION N/A 01/02/2021   Procedure: TRANSESOPHAGEAL ECHOCARDIOGRAM (TEE);  Surgeon: Sonny Dust, MD;  Location: [facility] ENDOSCOPY;  Service: Cardiovascular;  Laterality: N/A;   TONSILLECTOMY     UMBILICAL HERNIA REPAIR N/A 03/15/2015   Procedure: UMBILICAL HERNIA REPAIR WITH MESH;  Surgeon: Oza Blumenthal, MD;  Location: [facility] OR;  Service: General;  Laterality: N/A;    SH: Social History   Tobacco Use   Smoking status: Every Day    Current packs/day: 0.50    Average packs/day: 0.5 packs/day for 29.0 years (14.5 ttl pk-yrs)    Types: Cigarettes   Smokeless tobacco: Never   Tobacco comments:    quit 12/28/2020  Vaping Use   Vaping status: Never Used  Substance Use Topics   Alcohol use: Yes    Comment: occasionally holidays   Drug use: No    ROS: Constitutional:  Negative for fever, chills, weight loss CV: Negative for chest pain, previous MI, hypertension Respiratory:  Negative for shortness of breath, wheezing, sleep apnea, frequent cough GI:  Negative for nausea, vomiting, bloody stool, GERD  PE: BP (!) 153/83   Pulse 66   Ht 5' 7 (1.702 m)   Wt 220 lb (99.8  kg)   BMI 34.46 kg/m  GENERAL APPEARANCE:  Well appearing, well developed, well nourished, NAD HEENT:  Atraumatic, normocephalic, oropharynx clear NECK:  Supple without lymphadenopathy or thyromegaly ABDOMEN:  Soft, non-tender, no masses EXTREMITIES:  Moves all extremities well, without clubbing, cyanosis, or edema NEUROLOGIC:  Alert and oriented x 3, normal gait, CN II-XII grossly intact MENTAL STATUS:  appropriate BACK:  Non-tender to palpation, No CVAT SKIN:  Warm, dry, and intact   Results: U/A: 0-5 WBCs, 0-2 RBCs

## 2024-06-10 ENCOUNTER — Ambulatory Visit: Payer: Self-pay | Admitting: Urology

## 2024-06-10 LAB — PSA: Prostate Specific Ag, Serum: 2 ng/mL (ref 0.0–4.0)

## 2024-06-12 ENCOUNTER — Encounter: Payer: Self-pay | Admitting: Medical

## 2024-06-12 MED ORDER — ACCU-CHEK GUIDE TEST VI STRP
ORAL_STRIP | 12 refills | Status: AC
Start: 2024-06-12 — End: ?

## 2024-10-26 ENCOUNTER — Encounter: Payer: Self-pay | Admitting: Radiology

## 2024-12-01 ENCOUNTER — Ambulatory Visit: Admitting: Urology

## 2024-12-01 NOTE — Progress Notes (Deleted)
 Assessment: 1. Rising PSA level   2. Erectile dysfunction of organic origin      Plan: PSA today Continue tadalafil  20 mg as needed. Return to office in 6 months   Chief Complaint: No chief complaint on file.   HPI: Aaron Gibson is a 58 y.o. male who presents for continued evaluation of rising PSA and ED. He was previously evaluated by Dr. Shona.  PSA data: 08/2018             0.51 01/2021             0.76 10/2023  2.12 05/2024   2.0  Patient reported minimal lower urinary tract symptoms.  IPSS = 5. There is no family history of prostate cancer. DRE 12/24:  30 g, NT, no nodules   Patient reported some mild ED.  Often times he did not feel like he achieved a full erection and rapid detumescence.  He had not tried any medical therapy previously.  He was given a prescription for Cialis  20 mg in December 2024.  At his visit in June 2025, he was not having any new lower urinary tract symptoms.  He continued to have some occasional decreased stream and urgency.  No dysuria or gross hematuria. IPSS = 7/3. He continued to use tadalafil  20 mg as needed with good results.   Portions of the above documentation were copied from a prior visit for review purposes only.  Allergies: No Known Allergies  PMH: Past Medical History:  Diagnosis Date   Hypertension    untreated   Morbid obesity (HCC)    Pre-diabetes    per patient report   Stroke [facility])    2021- left sided weakness per patient report    PSH: Past Surgical History:  Procedure Laterality Date   BUBBLE STUDY  01/02/2021   Procedure: BUBBLE STUDY;  Surgeon: Hobart Powell BRAVO, MD;  Location: [facility] ENDOSCOPY;  Service: Cardiovascular;;   SHOULDER ARTHROSCOPY WITH SUBACROMIAL DECOMPRESSION, ROTATOR CUFF REPAIR AND BICEP TENDON REPAIR Left 08/01/2022   Procedure: LEFT SHOULDER SCOPE, EXTENSIVE DEBRIDEMENT, SUBACROMIAL DECOMPRESSION, BICEPS TENODESIS.;  Surgeon: Jerri Kay HERO, MD;  Location: [facility] OR;  Service: Orthopedics;   Laterality: Left;   TEE WITHOUT CARDIOVERSION N/A 01/02/2021   Procedure: TRANSESOPHAGEAL ECHOCARDIOGRAM (TEE);  Surgeon: Hobart Powell BRAVO, MD;  Location: [facility] ENDOSCOPY;  Service: Cardiovascular;  Laterality: N/A;   TONSILLECTOMY     UMBILICAL HERNIA REPAIR N/A 03/15/2015   Procedure: UMBILICAL HERNIA REPAIR WITH MESH;  Surgeon: Vicenta Poli, MD;  Location: [facility] OR;  Service: General;  Laterality: N/A;    SH: Social History   Tobacco Use   Smoking status: Every Day    Current packs/day: 0.50    Average packs/day: 0.5 packs/day for 29.0 years (14.5 ttl pk-yrs)    Types: Cigarettes   Smokeless tobacco: Never   Tobacco comments:    quit 12/28/2020  Vaping Use   Vaping status: Never Used  Substance Use Topics   Alcohol use: Yes    Comment: occasionally holidays   Drug use: No    ROS: Constitutional:  Negative for fever, chills, weight loss CV: Negative for chest pain, previous MI, hypertension Respiratory:  Negative for shortness of breath, wheezing, sleep apnea, frequent cough GI:  Negative for nausea, vomiting, bloody stool, GERD  PE: There were no vitals taken for this visit. GENERAL APPEARANCE:  Well appearing, well developed, well nourished, NAD HEENT:  Atraumatic, normocephalic, oropharynx clear NECK:  Supple without lymphadenopathy or thyromegaly ABDOMEN:  Soft,  non-tender, no masses EXTREMITIES:  Moves all extremities well, without clubbing, cyanosis, or edema NEUROLOGIC:  Alert and oriented x 3, normal gait, CN II-XII grossly intact MENTAL STATUS:  appropriate BACK:  Non-tender to palpation, No CVAT SKIN:  Warm, dry, and intact   Results: U/A:

## 2024-12-09 ENCOUNTER — Ambulatory Visit: Admitting: Urology
